# Patient Record
Sex: Female | Born: 1965 | Race: Black or African American | Hispanic: No | Smoking: Never smoker
Health system: Southern US, Community
[De-identification: ages and names within clinical notes are randomized; demographics above are authoritative.]

## PROBLEM LIST (undated history)

## (undated) DIAGNOSIS — I82409 Acute embolism and thrombosis of unspecified deep veins of unspecified lower extremity: Secondary | ICD-10-CM

## (undated) DIAGNOSIS — M797 Fibromyalgia: Secondary | ICD-10-CM

## (undated) DIAGNOSIS — J189 Pneumonia, unspecified organism: Secondary | ICD-10-CM

## (undated) DIAGNOSIS — G5603 Carpal tunnel syndrome, bilateral upper limbs: Secondary | ICD-10-CM

## (undated) DIAGNOSIS — D649 Anemia, unspecified: Secondary | ICD-10-CM

## (undated) DIAGNOSIS — C50919 Malignant neoplasm of unspecified site of unspecified female breast: Secondary | ICD-10-CM

## (undated) DIAGNOSIS — M329 Systemic lupus erythematosus, unspecified: Secondary | ICD-10-CM

## (undated) DIAGNOSIS — C801 Malignant (primary) neoplasm, unspecified: Secondary | ICD-10-CM

## (undated) DIAGNOSIS — R0600 Dyspnea, unspecified: Secondary | ICD-10-CM

## (undated) DIAGNOSIS — M1990 Unspecified osteoarthritis, unspecified site: Secondary | ICD-10-CM

## (undated) DIAGNOSIS — I7300 Raynaud's syndrome without gangrene: Secondary | ICD-10-CM

## (undated) DIAGNOSIS — IMO0002 Reserved for concepts with insufficient information to code with codable children: Secondary | ICD-10-CM

## (undated) HISTORY — PX: BREAST SURGERY: SHX581

## (undated) HISTORY — PX: UPPER GI ENDOSCOPY: SHX6162

## (undated) HISTORY — PX: CHOLECYSTECTOMY: SHX55

---

## 2012-05-25 ENCOUNTER — Encounter (HOSPITAL_BASED_OUTPATIENT_CLINIC_OR_DEPARTMENT_OTHER): Payer: Self-pay | Admitting: *Deleted

## 2012-05-25 ENCOUNTER — Emergency Department (HOSPITAL_BASED_OUTPATIENT_CLINIC_OR_DEPARTMENT_OTHER)
Admission: EM | Admit: 2012-05-25 | Discharge: 2012-05-25 | Disposition: A | Discharge status: Home / Self Care | Payer: Medicare Other | Attending: Emergency Medicine | Admitting: Emergency Medicine

## 2012-05-25 ENCOUNTER — Emergency Department (HOSPITAL_BASED_OUTPATIENT_CLINIC_OR_DEPARTMENT_OTHER): Payer: Medicare Other

## 2012-05-25 DIAGNOSIS — S8391XA Sprain of unspecified site of right knee, initial encounter: Secondary | ICD-10-CM

## 2012-05-25 DIAGNOSIS — X500XXA Overexertion from strenuous movement or load, initial encounter: Secondary | ICD-10-CM | POA: Insufficient documentation

## 2012-05-25 DIAGNOSIS — IMO0002 Reserved for concepts with insufficient information to code with codable children: Secondary | ICD-10-CM | POA: Insufficient documentation

## 2012-05-25 DIAGNOSIS — Y9389 Activity, other specified: Secondary | ICD-10-CM | POA: Insufficient documentation

## 2012-05-25 DIAGNOSIS — Z859 Personal history of malignant neoplasm, unspecified: Secondary | ICD-10-CM | POA: Insufficient documentation

## 2012-05-25 DIAGNOSIS — Y9289 Other specified places as the place of occurrence of the external cause: Secondary | ICD-10-CM | POA: Insufficient documentation

## 2012-05-25 DIAGNOSIS — Z8679 Personal history of other diseases of the circulatory system: Secondary | ICD-10-CM | POA: Insufficient documentation

## 2012-05-25 DIAGNOSIS — Z79899 Other long term (current) drug therapy: Secondary | ICD-10-CM | POA: Insufficient documentation

## 2012-05-25 DIAGNOSIS — Z8739 Personal history of other diseases of the musculoskeletal system and connective tissue: Secondary | ICD-10-CM | POA: Insufficient documentation

## 2012-05-25 HISTORY — DX: Raynaud's syndrome without gangrene: I73.00

## 2012-05-25 HISTORY — DX: Systemic lupus erythematosus, unspecified: M32.9

## 2012-05-25 HISTORY — DX: Reserved for concepts with insufficient information to code with codable children: IMO0002

## 2012-05-25 HISTORY — DX: Malignant (primary) neoplasm, unspecified: C80.1

## 2012-05-25 MED ORDER — OXYCODONE-ACETAMINOPHEN 5-325 MG PO TABS
2.0000 | ORAL_TABLET | Freq: Once | ORAL | Status: AC
  Administered 2012-05-25: 2 via ORAL
  Filled 2012-05-25 (×2): qty 2

## 2012-05-25 MED ORDER — OXYCODONE-ACETAMINOPHEN 5-325 MG PO TABS: 2.0000 | ORAL_TABLET | ORAL | Status: DC | PRN

## 2012-05-25 NOTE — ED Provider Notes (Signed)
Medical screening examination/treatment/procedure(s) were performed by non-physician practitioner and as supervising physician I was immediately available for consultation/collaboration.    Nelia Shi, MD 05/25/12 [phone]

## 2012-05-25 NOTE — ED Notes (Signed)
Pt states she was in an elevator when it stopped suddenly. Felt right knee pop.

## 2012-05-25 NOTE — ED Provider Notes (Signed)
History     CSN: [id number]  Arrival date & time 05/25/12  1902   First MD Initiated Contact with Patient 05/25/12 1939      Chief Complaint  Patient presents with  . Knee Injury    (Consider location/radiation/quality/duration/timing/severity/associated sxs/prior treatment) Patient is a 47 y.o. female presenting with knee pain. The history is provided by the patient. No language interpreter was used.  Knee Pain Location:  Knee Injury: yes   Knee location:  R knee Pain details:    Quality:  Aching   Severity:  Severe   Onset quality:  Sudden   Timing:  Constant   Progression:  Worsening Chronicity:  New Dislocation: no   Associated symptoms: no back pain    Pt reports elevator stopped and her knee popped. Past Medical History  Diagnosis Date  . Lupus   . Cancer   . Raynaud disease     Past Surgical History  Procedure Laterality Date  . Breast surgery    . Cholecystectomy      History reviewed. No pertinent family history.  History  Substance Use Topics  . Smoking status: Never Smoker   . Smokeless tobacco: Not on file  . Alcohol Use: No    OB History   Grav Para Term Preterm Abortions TAB SAB Ect Mult Living                  Review of Systems  Musculoskeletal: Negative for back pain.  All other systems reviewed and are negative.    Allergies  Review of patient's allergies indicates no known allergies.  Home Medications   Current Outpatient Rx  Name  Route  Sig  Dispense  Refill  . hydroxychloroquine (PLAQUENIL) 200 MG tablet   Oral   Take 200 mg by mouth 2 (two) times daily.           BP 136/71  Pulse 62  Temp(Src) 98.1 F (36.7 C) (Oral)  Resp 20  Ht 5\' 4"  (1.626 m)  Wt 200 lb (90.719 kg)  BMI 34.31 kg/m2  SpO2 100%  Physical Exam  Nursing note and vitals reviewed. Constitutional: She is oriented to person, place, and time. She appears well-developed and well-nourished.  HENT:  Head: Normocephalic and atraumatic.   Musculoskeletal: She exhibits tenderness.  Tender right knee diffusely anterior and posterior,  From,  nv and ns intact,  I am unable to assess stability  Neurological: She is alert and oriented to person, place, and time. She has normal reflexes.  Skin: Skin is warm and dry.  Psychiatric: She has a normal mood and affect.    ED Course  Procedures (including critical care time)  Labs Reviewed - No data to display Dg Knee Complete 4 Views Right  05/25/2012  *RADIOLOGY REPORT*  Clinical Data: Knee injury.  Pain  RIGHT KNEE - COMPLETE 4+ VIEW  Comparison:  None.  Findings:  There is no evidence of fracture, dislocation, or joint effusion.  There is no evidence of arthropathy or other focal bone abnormality.  Soft tissues are unremarkable.  IMPRESSION: Negative.   Original Report Authenticated By: Janeece Riggers, M.D.      No diagnosis found.    MDM  Xray no acute abnormality, Pt placed in a knee imbolizer,   Pt given rx for percocet.   I advised follow up with Dr. Pearletha Forge or your Physician for recheck        Elson Areas, [place] 05/25/12 2030  Lonia Skinner [place], [place] 05/25/12  2032 

## 2012-05-27 ENCOUNTER — Ambulatory Visit (INDEPENDENT_AMBULATORY_CARE_PROVIDER_SITE_OTHER): Payer: Medicare Other | Admitting: Family Medicine

## 2012-05-27 ENCOUNTER — Encounter: Payer: Self-pay | Admitting: Family Medicine

## 2012-05-27 VITALS — BP 129/74 | HR 68 | Ht 64.0 in | Wt 200.0 lb

## 2012-05-27 DIAGNOSIS — S8990XA Unspecified injury of unspecified lower leg, initial encounter: Secondary | ICD-10-CM

## 2012-05-27 DIAGNOSIS — S99929A Unspecified injury of unspecified foot, initial encounter: Secondary | ICD-10-CM

## 2012-05-27 DIAGNOSIS — S8991XA Unspecified injury of right lower leg, initial encounter: Secondary | ICD-10-CM

## 2012-05-27 MED ORDER — OXYCODONE-ACETAMINOPHEN 5-325 MG PO TABS: 1.0000 | ORAL_TABLET | Freq: Four times a day (QID) | ORAL | Status: DC | PRN

## 2012-05-27 NOTE — Patient Instructions (Addendum)
The x-rays in the emergency department were normal (no fractures). The greatest concern based on your limited motion, pain, swelling is that you injured the ACL or PCL and/or the meniscus of your knee. It's difficult to definitively say which of these though because the examination requires more of your knee to bend than it can right now. Ideally we move forward with an MRI to further assess the damage done to the inside of your knee and go from there. Start motion exercises as I showed you - do them twice a day. Can wear immobilizer or knee brace as needed but do not rely on this. Ibuprofen 3 tabs three times a day with food for pain and inflammation. Percocet as needed for severe pain but no driving on this medication. Follow up with me in 2 weeks at the latest.

## 2012-05-28 ENCOUNTER — Encounter: Payer: Self-pay | Admitting: Family Medicine

## 2012-05-28 DIAGNOSIS — M25561 Pain in right knee: Secondary | ICD-10-CM | POA: Insufficient documentation

## 2012-05-28 NOTE — Progress Notes (Signed)
Subjective:    Patient ID: Michelle Cain, female    DOB: 04-28-1965, 47 y.o.   MRN: [id number]  PCP: None  HPI 47 yo F here for right knee injury.  Patient reports on 2/8 she was in an elevator at the [facility] by [employer]. The elevator was moving slowly and appeared to stick then jerked downward quickly between 4th and 3rd floors causing her to have a sharp pain in right knee from knee twisting. Started limping. Noticed swelling that has improved since then.  No bruising. Taking tylenol and elevating as much as possible. In ED had x-rays negative for a fracture, placed in an immobilizer. Very difficult for her to bend knee at all due to pain. No prior right knee problems.  Past Medical History  Diagnosis Date  . Lupus   . Cancer   . Raynaud disease     Current Outpatient Prescriptions on File Prior to Visit  Medication Sig Dispense Refill  . hydroxychloroquine (PLAQUENIL) 200 MG tablet Take 200 mg by mouth 2 (two) times daily.       No current facility-administered medications on file prior to visit.    Past Surgical History  Procedure Laterality Date  . Breast surgery    . Cholecystectomy      No Known Allergies  History   Social History  . Marital Status: Single    Spouse Name: N/A    Number of Children: N/A  . Years of Education: N/A   Occupational History  . Not on file.   Social History Main Topics  . Smoking status: Never Smoker   . Smokeless tobacco: Not on file  . Alcohol Use: No  . Drug Use: No  . Sexually Active: Yes    Birth Control/ Protection: Post-menopausal   Other Topics Concern  . Not on file   Social History Narrative  . No narrative on file    Family History  Problem Relation Age of Onset  . Sudden death Neg Hx   . Hypertension Neg Hx   . Hyperlipidemia Neg Hx   . Heart attack Neg Hx   . Diabetes Neg Hx     BP 129/74  Pulse 68  Ht 5\' 4"  (1.626 m)  Wt 200 lb (90.719 kg)  BMI 34.31 kg/m2  Review of Systems See  HPI above.    Objective:   Physical Exam Gen: NAD, sitting in wheelchair with knee extended.  R knee: No obvious effusion though difficult to discern due to habitus (ultrasound does not show a large effusion).  No other deformity, no bruising. Diffuse anterior TTP including joint lines and post patellar facets. Very limited ROM with guarding - has full extension but can only bend about 10 degrees and has to stop due to pain. Unable to adequately perform drawers, lachmanns, mcmurrays, apleys. Negative apprehension though painful. NV intact distally.    Assessment & Plan:  1. Right knee injury - Exam very difficult to perform due to significant guarding and pain.  Advised she start doing home ROM exercises, ice, use ace wrap for compression, discontinue immobilizer.  Ibuprofen with percocet as needed for severe pain.  Two options at this point - reevaluate in 1-2 weeks to repeat exam, track how she's doing clinically vs going forward with MRI to assess for ACL tear, meniscal tears as a result of twisting injury.  She will start with conservative care for now (is trying to get things worked out with the hotel re: coverage) and call to go  forward with MRI.

## 2012-05-28 NOTE — Assessment & Plan Note (Signed)
Exam very difficult to perform due to significant guarding and pain.  Advised she start doing home ROM exercises, ice, use ace wrap for compression, discontinue immobilizer.  Ibuprofen with percocet as needed for severe pain.  Two options at this point - reevaluate in 1-2 weeks to repeat exam, track how she's doing clinically vs going forward with MRI to assess for ACL tear, meniscal tears as a result of twisting injury.  She will start with conservative care for now (is trying to get things worked out with the hotel re: coverage) and call to go forward with MRI.

## 2012-06-10 ENCOUNTER — Encounter: Payer: Self-pay | Admitting: Family Medicine

## 2012-06-10 ENCOUNTER — Ambulatory Visit: Payer: Medicare Other | Admitting: Family Medicine

## 2012-06-10 ENCOUNTER — Ambulatory Visit (INDEPENDENT_AMBULATORY_CARE_PROVIDER_SITE_OTHER): Payer: Medicare Other | Admitting: Family Medicine

## 2012-06-10 VITALS — BP 114/81 | HR 99 | Ht 64.0 in | Wt 200.0 lb

## 2012-06-10 DIAGNOSIS — S8991XD Unspecified injury of right lower leg, subsequent encounter: Secondary | ICD-10-CM

## 2012-06-10 DIAGNOSIS — M25561 Pain in right knee: Secondary | ICD-10-CM

## 2012-06-10 DIAGNOSIS — M25569 Pain in unspecified knee: Secondary | ICD-10-CM

## 2012-06-10 DIAGNOSIS — Z5189 Encounter for other specified aftercare: Secondary | ICD-10-CM

## 2012-06-10 MED ORDER — OXYCODONE-ACETAMINOPHEN 5-325 MG PO TABS: 1.0000 | ORAL_TABLET | Freq: Four times a day (QID) | ORAL | Status: DC | PRN

## 2012-06-11 ENCOUNTER — Encounter: Payer: Self-pay | Admitting: Family Medicine

## 2012-06-11 NOTE — Assessment & Plan Note (Signed)
Concerning for large meniscal tear.  Still a lot of difficulty bearing weight, joint line pain, inability to fully bend knee.  Will move forward with MRI to assess for meniscal tear.  In meantime percocet, ibuprofen, home exercise program, icing, relative rest.

## 2012-06-11 NOTE — Progress Notes (Addendum)
Subjective:    Patient ID: Michelle Cain, female    DOB: 04-01-1966, 47 y.o.   MRN: [id number]  PCP: None  HPI  47 yo F here for f/u right knee injury.  2/10: Patient reports on 2/8 she was in an elevator at the [facility] by [employer]. The elevator was moving slowly and appeared to stick then jerked downward quickly between 4th and 3rd floors causing her to have a sharp pain in right knee from knee twisting. Started limping. Noticed swelling that has improved since then.  No bruising. Taking tylenol and elevating as much as possible. In ED had x-rays negative for a fracture, placed in an immobilizer. Very difficult for her to bend knee at all due to pain. No prior right knee problems.  2/24: Patient reports right knee still very painful. Able to bend some but can't bend to the point she can sit comfortably. Some swelling. Doing home exercise program, using immobilizer as needed, ibuprofen and pain medication. Difficulty putting weight on this leg due to pain.  Past Medical History  Diagnosis Date  . Lupus   . Cancer   . Raynaud disease     Current Outpatient Prescriptions on File Prior to Visit  Medication Sig Dispense Refill  . amLODipine (NORVASC) 5 MG tablet Take 5 mg by mouth daily.      Marland Kitchen aspirin EC 81 MG tablet Take 81 mg by mouth daily.      Marland Kitchen azaTHIOprine (IMURAN) 50 MG tablet Take 50 mg by mouth 2 (two) times daily.      . hydroxychloroquine (PLAQUENIL) 200 MG tablet Take 200 mg by mouth 2 (two) times daily.      Marland Kitchen omeprazole (PRILOSEC) 20 MG capsule Take 20 mg by mouth daily.       No current facility-administered medications on file prior to visit.    Past Surgical History  Procedure Laterality Date  . Breast surgery    . Cholecystectomy      No Known Allergies  History   Social History  . Marital Status: Single    Spouse Name: N/A    Number of Children: N/A  . Years of Education: N/A   Occupational History  . Not on file.   Social  History Main Topics  . Smoking status: Never Smoker   . Smokeless tobacco: Not on file  . Alcohol Use: No  . Drug Use: No  . Sexually Active: Yes    Birth Control/ Protection: Post-menopausal   Other Topics Concern  . Not on file   Social History Narrative  . No narrative on file    Family History  Problem Relation Age of Onset  . Sudden death Neg Hx   . Hypertension Neg Hx   . Hyperlipidemia Neg Hx   . Heart attack Neg Hx   . Diabetes Neg Hx     BP 114/81  Pulse 99  Ht 5\' 4"  (1.626 m)  Wt 200 lb (90.719 kg)  BMI 34.31 kg/m2  Review of Systems  See HPI above.    Objective:   Physical Exam  Gen: NAD.  R knee: No obvious effusion though difficult to discern due to habitus.  No other deformity, no bruising. Medial > lateral joint line tenderness.  No post patellar facet, other knee TTP. ROM now 0 - 80 degrees, mildly improved. Negative valgus and varus stress.  Negative ant and post drawers.  Positive mcmurrays and apleys - pain felt deep with both of these. Negative apprehension. NV  intact distally.    Assessment & Plan:  1. Right knee injury - Concerning for large meniscal tear.  Still a lot of difficulty bearing weight, joint line pain, inability to fully bend knee.  Will move forward with MRI to assess for meniscal tear.  In meantime percocet, ibuprofen, home exercise program, icing, relative rest.  Addendum 3/3:  MRI results reviewed and discussed with patient.  No evidence meniscal tear or ligament tear.  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  Discussed PT, cortisone injection - she would like to try cortisone injection - will call us to set up appointment for this.

## 2012-06-15 ENCOUNTER — Ambulatory Visit (HOSPITAL_BASED_OUTPATIENT_CLINIC_OR_DEPARTMENT_OTHER)
Admission: RE | Admit: 2012-06-15 | Discharge: 2012-06-15 | Disposition: A | Discharge status: Home / Self Care | Payer: Medicare Other | Source: Ambulatory Visit | Attending: Family Medicine | Admitting: Family Medicine

## 2012-06-15 DIAGNOSIS — M25669 Stiffness of unspecified knee, not elsewhere classified: Secondary | ICD-10-CM | POA: Insufficient documentation

## 2012-06-15 DIAGNOSIS — M25561 Pain in right knee: Secondary | ICD-10-CM

## 2012-06-15 DIAGNOSIS — S8990XA Unspecified injury of unspecified lower leg, initial encounter: Secondary | ICD-10-CM | POA: Insufficient documentation

## 2012-06-15 DIAGNOSIS — M2240 Chondromalacia patellae, unspecified knee: Secondary | ICD-10-CM | POA: Insufficient documentation

## 2012-06-15 DIAGNOSIS — X58XXXA Exposure to other specified factors, initial encounter: Secondary | ICD-10-CM | POA: Insufficient documentation

## 2012-06-15 DIAGNOSIS — M25569 Pain in unspecified knee: Secondary | ICD-10-CM | POA: Insufficient documentation

## 2012-06-20 ENCOUNTER — Ambulatory Visit (INDEPENDENT_AMBULATORY_CARE_PROVIDER_SITE_OTHER): Payer: Medicare Other | Admitting: Family Medicine

## 2012-06-20 ENCOUNTER — Encounter: Payer: Self-pay | Admitting: Family Medicine

## 2012-06-20 VITALS — BP 136/81 | Ht 64.0 in | Wt 200.0 lb

## 2012-06-20 DIAGNOSIS — M25569 Pain in unspecified knee: Secondary | ICD-10-CM

## 2012-06-20 DIAGNOSIS — M25561 Pain in right knee: Secondary | ICD-10-CM

## 2012-06-24 ENCOUNTER — Encounter: Payer: Self-pay | Admitting: Family Medicine

## 2012-06-24 NOTE — Assessment & Plan Note (Signed)
Flare of underlying chronic OCD.  Advised we trial cortisone injection vs PT - she would like to go ahead with injection which was done today.  Consider PT if not improving as expected.  F/u in 1 month to 6 weeks.    After informed written consent, patient was seated on exam table. Right knee was prepped with alcohol swab and utilizing anteromedial approach, patient's right knee was injected intraarticularly with 3:1 marcaine: depomedrol. Patient tolerated the procedure well without immediate complications.

## 2012-06-24 NOTE — Progress Notes (Signed)
Subjective:    Patient ID: Michelle Cain, female    DOB: 02/27/1966, 47 y.o.   MRN: [id number]  PCP: None  Knee Pain    47 yo F here for f/u right knee injury.  2/10: Patient reports on 2/8 she was in an elevator at the [facility] by [employer]. The elevator was moving slowly and appeared to stick then jerked downward quickly between 4th and 3rd floors causing her to have a sharp pain in right knee from knee twisting. Started limping. Noticed swelling that has improved since then.  No bruising. Taking tylenol and elevating as much as possible. In ED had x-rays negative for a fracture, placed in an immobilizer. Very difficult for her to bend knee at all due to pain. No prior right knee problems.  2/24: Patient reports right knee still very painful. Able to bend some but can't bend to the point she can sit comfortably. Some swelling. Doing home exercise program, using immobilizer as needed, ibuprofen and pain medication. Difficulty putting weight on this leg due to pain.  3/6: Patient comes in today for intraarticular cortisone injection.   MRI showed no evidence of meniscal, ligamentous injury though she does have a small < 1 cm osteochondral defect that was likely flared in the elevator accident.  Past Medical History  Diagnosis Date  . Lupus   . Cancer   . Raynaud disease     Current Outpatient Prescriptions on File Prior to Visit  Medication Sig Dispense Refill  . amLODipine (NORVASC) 5 MG tablet Take 5 mg by mouth daily.      Marland Kitchen aspirin EC 81 MG tablet Take 81 mg by mouth daily.      Marland Kitchen azaTHIOprine (IMURAN) 50 MG tablet Take 50 mg by mouth 2 (two) times daily.      . hydroxychloroquine (PLAQUENIL) 200 MG tablet Take 200 mg by mouth 2 (two) times daily.      Marland Kitchen omeprazole (PRILOSEC) 20 MG capsule Take 20 mg by mouth daily.      Marland Kitchen oxyCODONE-acetaminophen (PERCOCET/ROXICET) 5-325 MG per tablet Take 1 tablet by mouth every 6 (six) hours as needed for pain.  60 tablet  0    No current facility-administered medications on file prior to visit.    Past Surgical History  Procedure Laterality Date  . Breast surgery    . Cholecystectomy      No Known Allergies  History   Social History  . Marital Status: Single    Spouse Name: N/A    Number of Children: N/A  . Years of Education: N/A   Occupational History  . Not on file.   Social History Main Topics  . Smoking status: Never Smoker   . Smokeless tobacco: Not on file  . Alcohol Use: No  . Drug Use: No  . Sexually Active: Yes    Birth Control/ Protection: Post-menopausal   Other Topics Concern  . Not on file   Social History Narrative  . No narrative on file    Family History  Problem Relation Age of Onset  . Sudden death Neg Hx   . Hypertension Neg Hx   . Hyperlipidemia Neg Hx   . Heart attack Neg Hx   . Diabetes Neg Hx     BP 136/81  Ht 5\' 4"  (1.626 m)  Wt 200 lb (90.719 kg)  BMI 34.31 kg/m2  Review of Systems  See HPI above.    Objective:   Physical Exam  Gen: NAD.  R knee (  exam not repeated today): No obvious effusion though difficult to discern due to habitus.  No other deformity, no bruising. Medial > lateral joint line tenderness.  No post patellar facet, other knee TTP. ROM now 0 - 80 degrees, mildly improved. Negative valgus and varus stress.  Negative ant and post drawers.  Positive mcmurrays and apleys - pain felt deep with both of these. Negative apprehension. NV intact distally.    Assessment & Plan:  1. Right knee injury - Flare of underlying chronic OCD.  Advised we trial cortisone injection vs PT - she would like to go ahead with injection which was done today.  Consider PT if not improving as expected.  F/u in 1 month to 6 weeks.    After informed written consent, patient was seated on exam table. Right knee was prepped with alcohol swab and utilizing anteromedial approach, patient's right knee was injected intraarticularly with 3:1 marcaine:  depomedrol. Patient tolerated the procedure well without immediate complications.

## 2012-07-19 ENCOUNTER — Ambulatory Visit: Payer: Medicare Other | Admitting: Family Medicine

## 2012-07-30 ENCOUNTER — Encounter: Payer: Self-pay | Admitting: Family Medicine

## 2012-07-30 ENCOUNTER — Ambulatory Visit (INDEPENDENT_AMBULATORY_CARE_PROVIDER_SITE_OTHER): Payer: Medicare Other | Admitting: Family Medicine

## 2012-07-30 VITALS — BP 137/84 | HR 70 | Ht 64.0 in | Wt 210.0 lb

## 2012-07-30 DIAGNOSIS — M722 Plantar fascial fibromatosis: Secondary | ICD-10-CM

## 2012-07-30 DIAGNOSIS — M25561 Pain in right knee: Secondary | ICD-10-CM

## 2012-07-30 DIAGNOSIS — M25569 Pain in unspecified knee: Secondary | ICD-10-CM

## 2012-07-30 MED ORDER — TRAMADOL HCL 50 MG PO TABS: 50.0000 mg | ORAL_TABLET | Freq: Three times a day (TID) | ORAL | Status: DC | PRN

## 2012-07-30 NOTE — Patient Instructions (Addendum)
You have plantar fasciitis Take tramadol, tylenol, and/or aleve as needed for pain - tramadol was sent to the pharmacy downstairs. Plantar fascia stretch for 20-30 seconds (do 3 of these) in morning Lowering/raise on a step exercises 3 x 10 once or twice a day - this is very important for long term recovery. Can add heel walks, toe walks forward and backward as well Ice heel for 15 minutes as needed. Avoid flat shoes/barefoot walking as much as possible. Arch straps have been shown to help with pain. Orthotics with heel lift may be helpful (Dr. Jari Sportsman active series) Steroid injection is a consideration for short term pain relief if you are struggling. Start physical therapy for right plantar fasciitis and your right knee. Follow up with me in 6 weeks.

## 2012-07-31 ENCOUNTER — Encounter: Payer: Self-pay | Admitting: Family Medicine

## 2012-07-31 DIAGNOSIS — M722 Plantar fascial fibromatosis: Secondary | ICD-10-CM | POA: Insufficient documentation

## 2012-07-31 NOTE — Progress Notes (Signed)
Subjective:    Patient ID: Michelle Cain, female    DOB: 10-02-1965, 47 y.o.   MRN: [id number]  PCP: None  Knee Pain    47 yo F here for f/u right knee injury.  2/10: Patient reports on 2/8 she was in an elevator at the [facility] [employer]. The elevator was moving slowly and appeared to stick then jerked downward quickly between 4th and 3rd floors causing her to have a sharp pain in right knee from knee twisting. Started limping. Noticed swelling that has improved since then.  No bruising. Taking tylenol and elevating as much as possible. In ED had x-rays negative for a fracture, placed in an immobilizer. Very difficult for her to bend knee at all due to pain. No prior right knee problems.  2/24: Patient reports right knee still very painful. Able to bend some but can't bend to the point she can sit comfortably. Some swelling. Doing home exercise program, using immobilizer as needed, ibuprofen and pain medication. Difficulty putting weight on this leg due to pain.  3/6: Patient comes in today for intraarticular cortisone injection.   MRI showed no evidence of meniscal, ligamentous injury though she does have a small < 1 cm osteochondral defect that was likely flared in the elevator accident.  4/15: Patient reports she is 80% improved from right knee injury following injection. She has also started to have problems with bottom of foot on this side. Takes oxycodone as needed for pain.  Past Medical History  Diagnosis Date  . Lupus   . Cancer   . Raynaud disease     Current Outpatient Prescriptions on File Prior to Visit  Medication Sig Dispense Refill  . amLODipine (NORVASC) 5 MG tablet Take 5 mg by mouth daily.      Marland Kitchen aspirin EC 81 MG tablet Take 81 mg by mouth daily.      Marland Kitchen azaTHIOprine (IMURAN) 50 MG tablet Take 50 mg by mouth 2 (two) times daily.      . hydroxychloroquine (PLAQUENIL) 200 MG tablet Take 200 mg by mouth 2 (two) times daily.      Marland Kitchen omeprazole  (PRILOSEC) 20 MG capsule Take 20 mg by mouth daily.       No current facility-administered medications on file prior to visit.    Past Surgical History  Procedure Laterality Date  . Breast surgery    . Cholecystectomy      No Known Allergies  History   Social History  . Marital Status: Single    Spouse Name: N/A    Number of Children: N/A  . Years of Education: N/A   Occupational History  . Not on file.   Social History Main Topics  . Smoking status: Never Smoker   . Smokeless tobacco: Not on file  . Alcohol Use: No  . Drug Use: No  . Sexually Active: Yes    Birth Control/ Protection: Post-menopausal   Other Topics Concern  . Not on file   Social History Narrative  . No narrative on file    Family History  Problem Relation Age of Onset  . Sudden death Neg Hx   . Hypertension Neg Hx   . Hyperlipidemia Neg Hx   . Heart attack Neg Hx   . Diabetes Neg Hx     BP 137/84  Pulse 70  Ht 5\' 4"  (1.626 m)  Wt 210 lb (95.255 kg)  BMI 36.03 kg/m2  Review of Systems  See HPI above.  Objective:   Physical Exam  Gen: NAD.  R knee: No obvious effusion though difficult to discern due to habitus.  No other deformity, no bruising. Medial > lateral joint line tenderness.  No post patellar facet, other knee TTP. ROM 0 - 110 degrees, improved. Negative valgus and varus stress.  Negative ant and post drawers.  Negative mcmurrays and apleys - pain felt deep with both of these. Negative apprehension. NV intact distally.  R foot: Tenderness at plantar fascia insertion on calcaneus.    Assessment & Plan:  1. Right knee injury - Flare of underlying chronic OCD.  Patient is 80% improved with injection, oral medications.  Will start PT for this and plantar fasciitis.  F/u in about 6 weeks for reevaluation.  2. Right plantar fasciitis - arch binder, start formal PT.  Consider inserts/orthotics, injection if not improving.

## 2012-07-31 NOTE — Assessment & Plan Note (Signed)
arch binder, start formal PT.  Consider inserts/orthotics, injection if not improving.

## 2012-07-31 NOTE — Assessment & Plan Note (Signed)
Flare of underlying chronic OCD.  Patient is 80% improved with injection, oral medications.  Will start PT for this and plantar fasciitis.  F/u in about 6 weeks for reevaluation.

## 2012-08-08 ENCOUNTER — Other Ambulatory Visit: Payer: Self-pay | Admitting: *Deleted

## 2012-08-08 MED ORDER — MELOXICAM 15 MG PO TABS: 15.0000 mg | ORAL_TABLET | Freq: Every day | ORAL | Status: DC

## 2012-09-10 ENCOUNTER — Ambulatory Visit: Payer: Medicare Other | Admitting: Family Medicine

## 2019-08-25 ENCOUNTER — Telehealth: Payer: Self-pay | Admitting: Oncology

## 2019-08-25 NOTE — Progress Notes (Signed)
Spoke with patient this morning regarding referral we received from [facility] in [place].  The patient states she just wants to use [place] as her treatment center but plans to keep her oncologist there.  I explained my role as GI nurse navigator. She is aware of her appointment tomorrow 5/11 at 2:00 with Dr. Benay Spice.

## 2019-08-25 NOTE — Telephone Encounter (Signed)
Received a new pt referral from the [facility]. [name] has been cld and scheduled to see Dr. Benay Spice on 5/11 at 2pm. She's aware to arrive 15 minutes early. I provided the address and location to our facility.

## 2019-08-26 ENCOUNTER — Other Ambulatory Visit: Payer: Self-pay

## 2019-08-26 ENCOUNTER — Inpatient Hospital Stay: Payer: Medicare Other | Attending: Oncology | Admitting: Oncology

## 2019-08-26 VITALS — BP 113/68 | HR 79 | Temp 98.5°F | Resp 18 | Ht 64.0 in | Wt 216.2 lb

## 2019-08-26 DIAGNOSIS — I10 Essential (primary) hypertension: Secondary | ICD-10-CM | POA: Insufficient documentation

## 2019-08-26 DIAGNOSIS — Z7189 Other specified counseling: Secondary | ICD-10-CM | POA: Diagnosis not present

## 2019-08-26 DIAGNOSIS — C169 Malignant neoplasm of stomach, unspecified: Secondary | ICD-10-CM | POA: Diagnosis not present

## 2019-08-26 DIAGNOSIS — Z86718 Personal history of other venous thrombosis and embolism: Secondary | ICD-10-CM | POA: Insufficient documentation

## 2019-08-26 DIAGNOSIS — R1310 Dysphagia, unspecified: Secondary | ICD-10-CM | POA: Insufficient documentation

## 2019-08-26 DIAGNOSIS — G629 Polyneuropathy, unspecified: Secondary | ICD-10-CM | POA: Insufficient documentation

## 2019-08-26 DIAGNOSIS — Z5111 Encounter for antineoplastic chemotherapy: Secondary | ICD-10-CM | POA: Insufficient documentation

## 2019-08-26 DIAGNOSIS — M329 Systemic lupus erythematosus, unspecified: Secondary | ICD-10-CM | POA: Insufficient documentation

## 2019-08-26 DIAGNOSIS — Z853 Personal history of malignant neoplasm of breast: Secondary | ICD-10-CM | POA: Insufficient documentation

## 2019-08-26 DIAGNOSIS — D709 Neutropenia, unspecified: Secondary | ICD-10-CM | POA: Insufficient documentation

## 2019-08-26 DIAGNOSIS — Z7901 Long term (current) use of anticoagulants: Secondary | ICD-10-CM | POA: Insufficient documentation

## 2019-08-26 DIAGNOSIS — R0602 Shortness of breath: Secondary | ICD-10-CM | POA: Insufficient documentation

## 2019-08-26 DIAGNOSIS — I7300 Raynaud's syndrome without gangrene: Secondary | ICD-10-CM | POA: Insufficient documentation

## 2019-08-26 DIAGNOSIS — Z5112 Encounter for antineoplastic immunotherapy: Secondary | ICD-10-CM | POA: Insufficient documentation

## 2019-08-26 NOTE — Progress Notes (Signed)
[facility] Patient Consult   Requesting MD: Julius Boniface 54 y.o.  June 27, 1965    Reason for Consult: Gastric cancer   HPI: [name] developed progressive dysphagia in the spring 2019.  She underwent an upper endoscopy in [place].  This confirmed esophageal erosions and gastric erosions.  A biopsy of the distal esophagus, gastric erosion, and random stomach biopsies returned positive for adenocarcinoma, diffuse pattern involving the distal esophagus and gastric antrum.  The HER-2 returned negative.  A colonoscopy was aborted due to a narrow rectum. CTs revealed segments of wall thickening and luminal narrowing of the colon at the hepatic flexure and a thickened segment at the cecum.  A PET scan showed no evidence of distant metastases or abnormal uptake other than the thickening in the distal esophagus and proximal stomach.  She was referred to the [facility] in [place] in June 2019.  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  No loss of mismatch repair protein expression.  Immunohistochemical stains on the gastric biopsies found the tumor cells negative for GATA 3 and ER.  A repeat upper endoscopy and colonoscopy 10/05/2017 revealed multiple foci of thickening/masses in the colon including at the hepatic flexure, cecum, and distal rectum.  Biopsies from the stomach revealed adenocarcinoma.  Biopsies from the cecum and hepatic flexure revealed metastatic adenocarcinoma consistent with gastric origin.  A PD-L1 combined positive score on the gastric biopsy returned at 2.  No loss of mismatch repair protein expression on the hepatic flexure biopsy.  MSI testing could not be performed.  She began treatment with FOLFOX in 08/2017.  Restaging CTs in October 2019 revealed stable  disease and FOLFOX was continued.  CTs in December 2019 revealed stable disease.  She was transitioned to Xeloda maintenance in January 2020.  Her care was transitioned to the cancer treatment center of [place] in [place] where she continued Xeloda maintenance.  Restaging CTs in June and September 2020 revealed stable disease.  CTs on 06/23/2019 revealed no evidence of metastatic disease in the lungs.  Nonspecific thickening at the GE junction.  There is interval development of a bladder mass measuring 5 cm.  A cystoscopy revealed metastatic signet ring cell adenocarcinoma.  Xeloda was discontinued and treatment was started with ramucirumab/paclitaxel with day 1 cycle 1 given on 08/19/2019.  [name] lives in [place].  She has decided to transition her treatment to [facility].  She is here today with her mother.  Past Medical History:  Diagnosis Date  .  Left breast cancer, multifocal, largest 1.8 cm, grade 3, negative resection margins,pT1c,pN0, ER positive, PR positive, and HER-2 positive  2008  . Lupus, mixed connective tissue disease   . Raynaud disease     .  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  Lower extremity DVT   .  G1, P1  Past Surgical History:  Procedure Laterality Date  . BREAST SURGERY    . CHOLECYSTECTOMY      Medications: Reviewed  Allergies: No Known Allergies  Family history: A maternal aunt had ovarian cancer.  2 maternal aunts had breast cancer.  Maternal cousin had thyroid cancer.  Paternal great aunts had breast cancer  Social History:   She lives with her mother and grandson in [place].  She does not use cigarettes or alcohol.  No transfusion history.  No risk  factor for HIV or hepatitis.  She does not work outside of the home  ROS:   Positives include: Anorexia, 60 pound weight loss, vomits approximately 3 times daily with solids, diffuse arthralgias, "lump "near the right ear beginning yesterday, hoarseness, numbness in  the fingers and toes, skin thickening and dryness of the hands and feet  A complete ROS was otherwise negative.  Physical Exam:  Blood pressure 113/68, pulse 79, temperature 98.5 F (36.9 C), temperature source Temporal, resp. rate 18, height _0  (1.626 m), weight 216 lb 3.2 oz (98.1 kg), SpO2 99 %.  HEENT: 1-2 cm area of firm masslike fullness posterior to the angle of the right jaw Lungs: Clear bilaterally Cardiac: Regular rate and rhythm Abdomen: No hepatosplenomegaly, no mass, nontender  Vascular: No leg edema Lymph nodes: No cervical, supraclavicular, axillary, or inguinal nodes Neurologic: Alert and oriented, the motor exam appears grossly intact in the upper and lower extremities bilaterally, mild loss of vibratory sense of the fingertips bilaterally Skin: No rash, skin thickening and dry desquamation at the palms and soles Musculoskeletal: No spine tenderness     Imaging:  As per HPI   Assessment/Plan:   1. Gastric cancer  Presenting with dysphagia spring 2019  Upper endoscopy 08/17/2017 revealed gastric and esophageal erosion, biopsies from the distal esophagus, gastric erosion, and random stomach biopsies confirmed poorly differentiated invasive adenocarcinoma with signet ring cell morphology, no loss of mismatch repair protein expression, HER-2 negative by FISH, GATA3 negative, ER negative  CTs revealed wall thickening and luminal narrowing of the colon at the hepatic flexure and cecum  PET scan with no evidence of distant metastatic disease or abnormal uptake other than thickening of the distal esophagus and proximal stomach  Colonoscopy 10/05/2017 at [facility] revealed multiple foci of thickening/masses at the cecum, hepatic flexure, and distal rectum, biopsy from the cecum and hepatic flexure revealed metastatic adenocarcinoma of gastric origin, 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, PD-L1 combined  positive score 2 on hepatic flexure biopsy  CTs 10/23/2017-diffuse prominence of the gastric wall, especially the antrum, focal wall thickening of the distal ascending colon and hepatic flexure, thickening of the cecum, nonspecific haziness of the mesenteric fat in the pelvis, mild prominence of lymph nodes at the greater curvature of the stomach  Treatment with FOLFOX beginning July 2019  CTs October 2019-stable disease, FOLFOX continued  CTs December 2019-stable disease  January 2020 treatment transition to Xeloda maintenance, care transition to [facility]  CTs in June 2020 in September 2020-stable disease, Xeloda continued  CTs 06/23/2019-nonspecific thickening of the GE junction, intraluminal bladder mass  Cystoscopy-metastatic signet ring cell adenocarcinoma  Cycle 1 day 1 ramucirumab/paclitaxel 08/19/2019  2.  Left breast cancer 2008,pT1c,pN0, status post a left lumpectomy with adjuvant chemotherapy and radiation, ER positive, PR positive, HER-2 positive  3.  Mixed connective tissue disease/SLE  4.  Lower extremity deep vein thrombosis maintained on apixaban  5.  Family history of multiple cancers including breast and ovarian cancer  6.  Dysphagia and intermittent vomiting secondary to #1  7.  Hypertension  8.  Peripheral neuropathy  9.  Masslike fullness at the posterior right parotid/angle of the jaw   Disposition:   [name] was diagnosed with gastric cancer in 2019.  She was noted to have a diffuse gastric adenocarcinoma with metastatic disease involving the colon.  She has been treated with systemic therapy for the past 2 years, initially FOLFOX and  then transition to maintenance capecitabine in January 2020.  There was recent disease progression in the bladder and treatment has been changed to paclitaxel/ramucirumab.  I discussed the prognosis and treatment options with [name] and her mother.  We reviewed the treatment  history and current treatment plan.  She understands no therapy will be curative.  The goal of systemic therapy is to delay disease progression, improve symptoms, and potentially prolong survival.  I agree with the plan to continue treatment with paclitaxel and ramucirumab.  We reviewed potential toxicities associated with this regimen including the chance for alopecia and hematologic toxicity.  We discussed the neuropathy and bone pain associated with paclitaxel.  We discussed the hypertension, bleeding, thromboembolic disease, bowel perforation, and CNS toxicity associated with ramucirumab.  She would like to continue paclitaxel/ramucirumab therapy.  The plan is to begin a new cycle on 09/02/2019.  She has a significant family history of multiple cancers including breast and ovarian cancer.  She agrees to a referral to the genetics counselor for molecular testing.  We will request additional records from the [facility] including results of recent Foundation 1 testing, chemotherapy records with regard to the number of FOLFOX cycles, CT and PET images, Doppler report, the pathology report from the bladder biopsy, and records regarding treatment of breast cancer.  She will return for an office visit to begin ramucirumab/paclitaxel therapy on 09/02/2019.  Betsy Coder, MD  08/26/2019, 2:16 PM

## 2019-08-26 NOTE — Progress Notes (Signed)
START ON PATHWAY REGIMEN - Gastroesophageal     A cycle is every 28 days:     Ramucirumab      Paclitaxel   **Always confirm dose/schedule in your pharmacy ordering system**  Patient Characteristics: Distant Metastases (cM1/pM1) / Locally Recurrent Disease, Adenocarcinoma - Esophageal, GE Junction, and Gastric, Second Line, MSS/pMMR or MSI Unknown Histology: Adenocarcinoma Disease Classification: Gastric Therapeutic Status: Distant Metastases (No Additional Staging) Line of Therapy: Second Line Microsatellite/Mismatch Repair Status: Unknown Intent of Therapy: Non-Curative / Palliative Intent, Discussed with Patient

## 2019-08-26 NOTE — Progress Notes (Signed)
Met with patient and her mother at her initial medical oncology appointment with Dr. Benay Spice today.  I had spoken to the patient on the phone previously.  She was given my card with my direct contact number and encouraged to call with any questions or concerns.  She previously received treatment in [place] at [facility].  We have received some of her records.  They both verbalized an understanding of the plan outlined by Dr. Benay Spice today and she will return on 5/18 for treatment.

## 2019-08-27 NOTE — Progress Notes (Signed)
All patient's records from [facility] have been sent to HIM to be scanned into the chart.

## 2019-08-29 NOTE — Progress Notes (Signed)
Pharmacist Chemotherapy Monitoring - Initial Assessment    Anticipated start date: 09/04/19 (C1 given at another facility)  Regimen:  . Are orders appropriate based on the patient's diagnosis, regimen, and cycle? Yes . Does the plan date match the patient's scheduled date? Yes . Is the sequencing of drugs appropriate? Yes . Are the premedications appropriate for the patient's regimen? Yes . Prior Authorization for treatment is: Pending o If applicable, is the correct biosimilar selected based on the patient's insurance? not applicable  Organ Function and Labs: Marland Kitchen Are dose adjustments needed based on the patient's renal function, hepatic function, or hematologic function? Yes . Are appropriate labs ordered prior to the start of patient's treatment? Yes . Other organ system assessment, if indicated: N/A . The following baseline labs, if indicated, have been ordered: ramucirumab: urine protein  Dose Assessment: . Are the drug doses appropriate? Yes . Are the following correct: o Drug concentrations Yes o IV fluid compatible with drug Yes o Administration routes Yes o Timing of therapy Yes . If applicable, does the patient have documented access for treatment and/or plans for port-a-cath placement? yes . If applicable, have lifetime cumulative doses been properly documented and assessed? yes Lifetime Dose Tracking  No doses have been documented on this patient for the following tracked chemicals: Doxorubicin, Epirubicin, Idarubicin, Daunorubicin, Mitoxantrone, Bleomycin, Oxaliplatin, Carboplatin, Liposomal Doxorubicin  o   Toxicity Monitoring/Prevention: . The patient has the following take home antiemetics prescribed: Ondansetron and Prochlorperazine . The patient has the following take home medications prescribed: N/A . Medication allergies and previous infusion related reactions, if applicable, have been reviewed and addressed. Yes . The patient's current medication list has been  assessed for drug-drug interactions with their chemotherapy regimen. no significant drug-drug interactions were identified on review.  Order Review: . Are the treatment plan orders signed? No . Is the patient scheduled to see a provider prior to their treatment? Yes  I verify that I have reviewed each item in the above checklist and answered each question accordingly.  Romualdo Bolk [facility] 08/29/2019 8:23 AM

## 2019-08-31 ENCOUNTER — Other Ambulatory Visit: Payer: Self-pay | Admitting: Oncology

## 2019-09-02 ENCOUNTER — Telehealth: Payer: Self-pay | Admitting: Oncology

## 2019-09-02 NOTE — Telephone Encounter (Signed)
Called and spoke with patient. Confirmed all upcoming appts  

## 2019-09-03 ENCOUNTER — Ambulatory Visit: Payer: Medicare Other | Admitting: Nurse Practitioner

## 2019-09-03 ENCOUNTER — Other Ambulatory Visit: Payer: Medicare Other

## 2019-09-04 ENCOUNTER — Inpatient Hospital Stay: Payer: Medicare Other

## 2019-09-04 ENCOUNTER — Encounter: Payer: Self-pay | Admitting: Nurse Practitioner

## 2019-09-04 ENCOUNTER — Other Ambulatory Visit: Payer: Self-pay

## 2019-09-04 ENCOUNTER — Inpatient Hospital Stay (HOSPITAL_BASED_OUTPATIENT_CLINIC_OR_DEPARTMENT_OTHER): Payer: Medicare Other | Admitting: Nurse Practitioner

## 2019-09-04 VITALS — BP 107/75 | HR 81 | Temp 97.8°F | Resp 18 | Wt 212.7 lb

## 2019-09-04 VITALS — BP 121/67 | HR 86 | Temp 99.1°F | Resp 16

## 2019-09-04 DIAGNOSIS — R1310 Dysphagia, unspecified: Secondary | ICD-10-CM | POA: Diagnosis not present

## 2019-09-04 DIAGNOSIS — R0602 Shortness of breath: Secondary | ICD-10-CM | POA: Diagnosis not present

## 2019-09-04 DIAGNOSIS — Z5111 Encounter for antineoplastic chemotherapy: Secondary | ICD-10-CM | POA: Diagnosis present

## 2019-09-04 DIAGNOSIS — Z853 Personal history of malignant neoplasm of breast: Secondary | ICD-10-CM | POA: Diagnosis not present

## 2019-09-04 DIAGNOSIS — R808 Other proteinuria: Secondary | ICD-10-CM

## 2019-09-04 DIAGNOSIS — C169 Malignant neoplasm of stomach, unspecified: Secondary | ICD-10-CM

## 2019-09-04 DIAGNOSIS — G629 Polyneuropathy, unspecified: Secondary | ICD-10-CM | POA: Diagnosis not present

## 2019-09-04 DIAGNOSIS — M329 Systemic lupus erythematosus, unspecified: Secondary | ICD-10-CM | POA: Diagnosis not present

## 2019-09-04 DIAGNOSIS — Z86718 Personal history of other venous thrombosis and embolism: Secondary | ICD-10-CM | POA: Diagnosis not present

## 2019-09-04 DIAGNOSIS — Z7901 Long term (current) use of anticoagulants: Secondary | ICD-10-CM | POA: Diagnosis not present

## 2019-09-04 DIAGNOSIS — Z95828 Presence of other vascular implants and grafts: Secondary | ICD-10-CM

## 2019-09-04 DIAGNOSIS — D709 Neutropenia, unspecified: Secondary | ICD-10-CM | POA: Diagnosis not present

## 2019-09-04 DIAGNOSIS — I10 Essential (primary) hypertension: Secondary | ICD-10-CM | POA: Diagnosis not present

## 2019-09-04 DIAGNOSIS — I7300 Raynaud's syndrome without gangrene: Secondary | ICD-10-CM | POA: Diagnosis not present

## 2019-09-04 DIAGNOSIS — Z5112 Encounter for antineoplastic immunotherapy: Secondary | ICD-10-CM | POA: Diagnosis present

## 2019-09-04 LAB — TOTAL PROTEIN, URINE DIPSTICK: Protein, ur: 100 mg/dL — AB

## 2019-09-04 LAB — CMP (CANCER CENTER ONLY)
ALT: 17 U/L (ref 0–44)
AST: 18 U/L (ref 15–41)
Albumin: 3.3 g/dL — ABNORMAL LOW (ref 3.5–5.0)
Alkaline Phosphatase: 79 U/L (ref 38–126)
Anion gap: 7 (ref 5–15)
BUN: 9 mg/dL (ref 6–20)
CO2: 27 mmol/L (ref 22–32)
Calcium: 8.5 mg/dL — ABNORMAL LOW (ref 8.9–10.3)
Chloride: 106 mmol/L (ref 98–111)
Creatinine: 0.79 mg/dL (ref 0.44–1.00)
GFR, Est AFR Am: >60 mL/min (ref >60)
GFR, Estimated: >60 mL/min (ref >60)
Glucose, Bld: 98 mg/dL (ref 70–99)
Potassium: 3.6 mmol/L (ref 3.5–5.1)
Sodium: 140 mmol/L (ref 135–145)
Total Bilirubin: 0.3 mg/dL (ref 0.3–1.2)
Total Protein: 6.7 g/dL (ref 6.5–8.1)

## 2019-09-04 LAB — URINALYSIS, ROUTINE W REFLEX MICROSCOPIC
Bilirubin Urine: NEGATIVE
Glucose, UA: NEGATIVE mg/dL
Ketones, ur: 5 mg/dL — AB
Nitrite: POSITIVE — AB
Protein, ur: >=300 mg/dL — AB
RBC / HPF: >50 RBC/hpf — ABNORMAL HIGH (ref 0–5)
Specific Gravity, Urine: 1.03 (ref 1.005–1.030)
pH: 5 (ref 5.0–8.0)

## 2019-09-04 LAB — CBC WITH DIFFERENTIAL (CANCER CENTER ONLY)
Abs Immature Granulocytes: 0.02 10*3/uL (ref 0.00–0.07)
Basophils Absolute: 0 10*3/uL (ref 0.0–0.1)
Basophils Relative: 0 %
Eosinophils Absolute: 0 10*3/uL (ref 0.0–0.5)
Eosinophils Relative: 1 %
HCT: 33.8 % — ABNORMAL LOW (ref 36.0–46.0)
Hemoglobin: 11.3 g/dL — ABNORMAL LOW (ref 12.0–15.0)
Immature Granulocytes: 1 %
Lymphocytes Relative: 41 %
Lymphs Abs: 1.3 10*3/uL (ref 0.7–4.0)
MCH: 32.9 pg (ref 26.0–34.0)
MCHC: 33.4 g/dL (ref 30.0–36.0)
MCV: 98.5 fL (ref 80.0–100.0)
Monocytes Absolute: 0.7 10*3/uL (ref 0.1–1.0)
Monocytes Relative: 20 %
Neutro Abs: 1.2 10*3/uL — ABNORMAL LOW (ref 1.7–7.7)
Neutrophils Relative %: 37 %
Platelet Count: 262 10*3/uL (ref 150–400)
RBC: 3.43 MIL/uL — ABNORMAL LOW (ref 3.87–5.11)
RDW: 13.9 % (ref 11.5–15.5)
WBC Count: 3.3 10*3/uL — ABNORMAL LOW (ref 4.0–10.5)
nRBC: 0 % (ref 0.0–0.2)

## 2019-09-04 LAB — CEA (IN HOUSE-CHCC): CEA (CHCC-In House): 3.45 ng/mL (ref 0.00–5.00)

## 2019-09-04 MED ORDER — SODIUM CHLORIDE 0.9 % IV SOLN
20.0000 mg | Freq: Once | INTRAVENOUS | Status: AC
  Administered 2019-09-04: 20 mg via INTRAVENOUS
  Filled 2019-09-04: qty 20

## 2019-09-04 MED ORDER — FAMOTIDINE IN NACL 20-0.9 MG/50ML-% IV SOLN
20.0000 mg | Freq: Once | INTRAVENOUS | Status: AC
  Administered 2019-09-04: 20 mg via INTRAVENOUS

## 2019-09-04 MED ORDER — CIPROFLOXACIN HCL 500 MG PO TABS
500.0000 mg | ORAL_TABLET | Freq: Two times a day (BID) | ORAL | 0 refills | Status: AC
Start: 2019-09-04 — End: 2019-09-09

## 2019-09-04 MED ORDER — DIPHENHYDRAMINE HCL 50 MG/ML IJ SOLN
INTRAMUSCULAR | Status: AC
  Filled 2019-09-04: qty 1

## 2019-09-04 MED ORDER — SODIUM CHLORIDE 0.9% FLUSH
10.0000 mL | INTRAVENOUS | Status: DC | PRN
  Administered 2019-09-04: 10 mL
  Filled 2019-09-04: qty 10

## 2019-09-04 MED ORDER — HEPARIN SOD (PORK) LOCK FLUSH 100 UNIT/ML IV SOLN
500.0000 [IU] | Freq: Once | INTRAVENOUS | Status: AC | PRN
  Administered 2019-09-04: 500 [IU]
  Filled 2019-09-04: qty 5

## 2019-09-04 MED ORDER — SODIUM CHLORIDE 0.9 % IV SOLN
Freq: Once | INTRAVENOUS | Status: AC
  Filled 2019-09-04: qty 250

## 2019-09-04 MED ORDER — SODIUM CHLORIDE 0.9% FLUSH
10.0000 mL | INTRAVENOUS | Status: DC | PRN
  Administered 2019-09-04: 10 mL via INTRAVENOUS
  Filled 2019-09-04: qty 10

## 2019-09-04 MED ORDER — DIPHENHYDRAMINE HCL 50 MG/ML IJ SOLN
25.0000 mg | Freq: Once | INTRAMUSCULAR | Status: AC
  Administered 2019-09-04: 25 mg via INTRAVENOUS

## 2019-09-04 MED ORDER — SODIUM CHLORIDE 0.9 % IV SOLN
8.0000 mg/kg | Freq: Once | INTRAVENOUS | Status: AC
  Administered 2019-09-04: 800 mg via INTRAVENOUS
  Filled 2019-09-04: qty 50

## 2019-09-04 MED ORDER — FAMOTIDINE IN NACL 20-0.9 MG/50ML-% IV SOLN
INTRAVENOUS | Status: AC
  Filled 2019-09-04: qty 50

## 2019-09-04 MED ORDER — SODIUM CHLORIDE 0.9 % IV SOLN
80.0000 mg/m2 | Freq: Once | INTRAVENOUS | Status: AC
  Administered 2019-09-04: 168 mg via INTRAVENOUS
  Filled 2019-09-04: qty 28

## 2019-09-04 NOTE — Progress Notes (Signed)
Patient with urine protein of 100 today. Okay to proceed with treatment per Ned Card, NP while UA is collected for patient.   Leron Croak, PharmD, BCPS PGY2 Hematology/Oncology Pharmacy Resident 09/04/2019 12:04 PM

## 2019-09-04 NOTE — Patient Instructions (Signed)
[facility] Discharge Instructions for Patients Receiving Chemotherapy  Today you received the following chemotherapy agents: Cyramza & Taxol  To help prevent nausea and vomiting after your treatment, we encourage you to take your nausea medication as prescribed.   If you develop nausea and vomiting that is not controlled by your nausea medication, call the clinic.   BELOW ARE SYMPTOMS THAT SHOULD BE REPORTED IMMEDIATELY:  *FEVER GREATER THAN 100.5 F  *CHILLS WITH OR WITHOUT FEVER  NAUSEA AND VOMITING THAT IS NOT CONTROLLED WITH YOUR NAUSEA MEDICATION  *UNUSUAL SHORTNESS OF BREATH  *UNUSUAL BRUISING OR BLEEDING  TENDERNESS IN MOUTH AND THROAT WITH OR WITHOUT PRESENCE OF ULCERS  *URINARY PROBLEMS  *BOWEL PROBLEMS  UNUSUAL RASH Items with * indicate a potential emergency and should be followed up as soon as possible.  Feel free to call the clinic should you have any questions or concerns. The clinic phone number is [phone].  Please show the CHEMO ALERT CARD at check-in to the Emergency Department and triage nurse.    Paclitaxel injection What is this medicine? PACLITAXEL (PAK li TAX el) is a chemotherapy drug. It targets fast dividing cells, like cancer cells, and causes these cells to die. This medicine is used to treat ovarian cancer, breast cancer, lung cancer, Kaposi's sarcoma, and other cancers. This medicine may be used for other purposes; ask your health care provider or pharmacist if you have questions. COMMON BRAND NAME(S): Onxol, Taxol What should I tell my health care provider before I take this medicine? They need to know if you have any of these conditions:  history of irregular heartbeat  liver disease  low blood counts, like low white cell, platelet, or red cell counts  lung or breathing disease, like asthma  tingling of the fingers or toes, or other nerve disorder  an unusual or allergic reaction to paclitaxel, alcohol,  polyoxyethylated castor oil, other chemotherapy, other medicines, foods, dyes, or preservatives  pregnant or trying to get pregnant  breast-feeding How should I use this medicine? This drug is given as an infusion into a vein. It is administered in a hospital or clinic by a specially trained health care professional. Talk to your pediatrician regarding the use of this medicine in children. Special care may be needed. Overdosage: If you think you have taken too much of this medicine contact a poison control center or emergency room at once. NOTE: This medicine is only for you. Do not share this medicine with others. What if I miss a dose? It is important not to miss your dose. Call your doctor or health care professional if you are unable to keep an appointment. What may interact with this medicine? Do not take this medicine with any of the following medications:  disulfiram  metronidazole This medicine may also interact with the following medications:  antiviral medicines for hepatitis, HIV or AIDS  certain antibiotics like erythromycin and clarithromycin  certain medicines for fungal infections like ketoconazole and itraconazole  certain medicines for seizures like carbamazepine, phenobarbital, phenytoin  gemfibrozil  nefazodone  rifampin  St. John's wort This list may not describe all possible interactions. Give your health care provider a list of all the medicines, herbs, non-prescription drugs, or dietary supplements you use. Also tell them if you smoke, drink alcohol, or use illegal drugs. Some items may interact with your medicine. What should I watch for while using this medicine? Your condition will be monitored carefully while you are receiving this medicine. You will   need important blood work done while you are taking this medicine. This medicine can cause serious allergic reactions. To reduce your risk you will need to take other medicine(s) before treatment with this  medicine. If you experience allergic reactions like skin rash, itching or hives, swelling of the face, lips, or tongue, tell your doctor or health care professional right away. In some cases, you may be given additional medicines to help with side effects. Follow all directions for their use. This drug may make you feel generally unwell. This is not uncommon, as chemotherapy can affect healthy cells as well as cancer cells. Report any side effects. Continue your course of treatment even though you feel ill unless your doctor tells you to stop. Call your doctor or health care professional for advice if you get a fever, chills or sore throat, or other symptoms of a cold or flu. Do not treat yourself. This drug decreases your body's ability to fight infections. Try to avoid being around people who are sick. This medicine may increase your risk to bruise or bleed. Call your doctor or health care professional if you notice any unusual bleeding. Be careful brushing and flossing your teeth or using a toothpick because you may get an infection or bleed more easily. If you have any dental work done, tell your dentist you are receiving this medicine. Avoid taking products that contain aspirin, acetaminophen, ibuprofen, naproxen, or ketoprofen unless instructed by your doctor. These medicines may hide a fever. Do not become pregnant while taking this medicine. Women should inform their doctor if they wish to become pregnant or think they might be pregnant. There is a potential for serious side effects to an unborn child. Talk to your health care professional or pharmacist for more information. Do not breast-feed an infant while taking this medicine. Men are advised not to father a child while receiving this medicine. This product may contain alcohol. Ask your pharmacist or healthcare provider if this medicine contains alcohol. Be sure to tell all healthcare providers you are taking this medicine. Certain medicines,  like metronidazole and disulfiram, can cause an unpleasant reaction when taken with alcohol. The reaction includes flushing, headache, nausea, vomiting, sweating, and increased thirst. The reaction can last from 30 minutes to several hours. What side effects may I notice from receiving this medicine? Side effects that you should report to your doctor or health care professional as soon as possible:  allergic reactions like skin rash, itching or hives, swelling of the face, lips, or tongue  breathing problems  changes in vision  fast, irregular heartbeat  high or low blood pressure  mouth sores  pain, tingling, numbness in the hands or feet  signs of decreased platelets or bleeding - bruising, pinpoint red spots on the skin, black, tarry stools, blood in the urine  signs of decreased red blood cells - unusually weak or tired, feeling faint or lightheaded, falls  signs of infection - fever or chills, cough, sore throat, pain or difficulty passing urine  signs and symptoms of liver injury like dark yellow or brown urine; general ill feeling or flu-like symptoms; light-colored stools; loss of appetite; nausea; right upper belly pain; unusually weak or tired; yellowing of the eyes or skin  swelling of the ankles, feet, hands  unusually slow heartbeat Side effects that usually do not require medical attention (report to your doctor or health care professional if they continue or are bothersome):  diarrhea  hair loss  loss of appetite  muscle  or joint pain  nausea, vomiting  pain, redness, or irritation at site where injected  tiredness This list may not describe all possible side effects. Call your doctor for medical advice about side effects. You may report side effects to FDA at 1-800-FDA-1088. Where should I keep my medicine? This drug is given in a hospital or clinic and will not be stored at home. NOTE: This sheet is a summary. It may not cover all possible information.  If you have questions about this medicine, talk to your doctor, pharmacist, or health care provider.  2020 Elsevier/Gold Standard (2016-12-05 13:14:55)   Ramucirumab injection What is this medicine? RAMUCIRUMAB (ra mue SIR ue mab) is a monoclonal antibody. It is used to treat stomach cancer, colorectal cancer, liver cancer, and lung cancer. This medicine may be used for other purposes; ask your health care provider or pharmacist if you have questions. COMMON BRAND NAME(S): Cyramza What should I tell my health care provider before I take this medicine? They need to know if you have any of these conditions:  bleeding disorders  blood clots  heart disease, including heart failure, heart attack, or chest pain (angina)  high blood pressure  infection (especially a virus infection such as chickenpox, cold sores, or herpes)  protein in your urine  recent or planning to have surgery  stroke  an unusual or allergic reaction to ramucirumab, other medicines, foods, dyes, or preservatives  pregnant or trying to get pregnant  breast-feeding How should I use this medicine? This medicine is for infusion into a vein. It is given by a health care professional in a hospital or clinic setting. Talk to your pediatrician regarding the use of this medicine in children. Special care may be needed. Overdosage: If you think you have taken too much of this medicine contact a poison control center or emergency room at once. NOTE: This medicine is only for you. Do not share this medicine with others. What if I miss a dose? It is important not to miss your dose. Call your doctor or health care professional if you are unable to keep an appointment. What may interact with this medicine? Interactions have not been studied. This list may not describe all possible interactions. Give your health care provider a list of all the medicines, herbs, non-prescription drugs, or dietary supplements you use. Also  tell them if you smoke, drink alcohol, or use illegal drugs. Some items may interact with your medicine. What should I watch for while using this medicine? Your condition will be monitored carefully while you are receiving this medicine. You will need to to check your blood pressure and have your blood and urine tested while you are taking this medicine. Your condition will be monitored carefully while you are receiving this medicine. This medicine may increase your risk to bruise or bleed. Call your doctor or health care professional if you notice any unusual bleeding. Before having surgery, talk to your health care provider to make sure it is ok. This drug can increase the risk of poor healing of your surgical site or wound. You will need to stop this drug for 28 days before surgery. After surgery, wait at least 2 weeks before restarting this drug. Make sure the surgical site or wound is healed enough before restarting this drug. Talk to your health care provider if questions. Do not become pregnant while taking this medicine or for 3 months after stopping it. Women should inform their doctor if they wish to become pregnant   or think they might be pregnant. There is a potential for serious side effects to an unborn child. Talk to your health care professional or pharmacist for more information. Do not breast-feed an infant while taking this medicine or for 2 months after stopping it. This medicine may interfere with the ability to have a child. Talk with your doctor or health care professional if you are concerned about your fertility. What side effects may I notice from receiving this medicine? Side effects that you should report to your doctor or health care professional as soon as possible:  allergic reactions like skin rash, itching or hives, breathing problems, swelling of the face, lips, or tongue  signs of infection - fever or chills, cough, sore throat  chest pain or chest  tightness  confusion  dizziness  feeling faint or lightheaded, falls  severe abdominal pain  severe nausea, vomiting  signs and symptoms of bleeding such as bloody or black, tarry stools; red or dark-brown urine; spitting up blood or brown material that looks like coffee grounds; red spots on the skin; unusual bruising or bleeding from the eye, gums, or nose  signs and symptoms of a blood clot such as breathing problems; changes in vision; chest pain; severe, sudden headache; pain, swelling, warmth in the leg; trouble speaking; sudden numbness or weakness of the face, arm or leg  symptoms of a stroke: change in mental awareness, inability to talk or move one side of the body  trouble walking, dizziness, loss of balance or coordination Side effects that usually do not require medical attention (report to your doctor or health care professional if they continue or are bothersome):  cold, clammy skin  constipation  diarrhea  headache  nausea, vomiting  stomach pain  unusually slow heartbeat  unusually weak or tired This list may not describe all possible side effects. Call your doctor for medical advice about side effects. You may report side effects to FDA at 1-800-FDA-1088. Where should I keep my medicine? This drug is given in a hospital or clinic and will not be stored at home. NOTE: This sheet is a summary. It may not cover all possible information. If you have questions about this medicine, talk to your doctor, pharmacist, or health care provider.  2020 Elsevier/Gold Standard (2019-01-29 11:17:50)  

## 2019-09-04 NOTE — Progress Notes (Signed)
OK to treat with ANC 1.2 per NP Lattie Haw.  Ok to treat with positive (100) urine protein level per NP Lisa, UA ordered on urine sample.  Lab made aware of add on.  NP Lattie Haw made aware of urinalysis results, ordered UC.  Lab made aware of add on.  Pt instructed to start oral abx per NP, prescription sent to preferred pharmacy.  Pt tolerated cyramza and taxol first time infusions well, no reactions.  VSS.

## 2019-09-04 NOTE — Progress Notes (Addendum)
[facility] OFFICE PROGRESS NOTE   Diagnosis: Gastric cancer  INTERVAL HISTORY:   [name] returns as scheduled.  She completed a treatment with paclitaxel/ramucirumab 08/19/2019 at another facility.  She denies nausea/vomiting.  No mouth sores.  No diarrhea.  She has fairly consistent numbness/tingling in the fingertips and toes.  She has joint pain related to lupus.  Appetite is poor.  Objective:  Vital signs in last 24 hours:  Blood pressure 107/75, pulse 81, temperature 97.8 F (36.6 C), temperature source Oral, resp. rate 18, weight 212 lb 11.2 oz (96.5 kg), SpO2 100 %.    HEENT: No thrush or ulcers. Resp: Lungs clear bilaterally. Cardio: Regular rate and rhythm. GI: Abdomen soft and nontender.  No hepatomegaly.  No mass. Vascular: No leg edema. Neuro: Vibratory sense mildly decreased over the fingertips per tuning fork exam. Skin: Palms with mild erythema, dryness. Port-A-Cath without erythema.   Lab Results:  Lab Results  Component Value Date   WBC 3.3 (L) 09/04/2019   HGB 11.3 (L) 09/04/2019   HCT 33.8 (L) 09/04/2019   MCV 98.5 09/04/2019   PLT 262 09/04/2019   NEUTROABS 1.2 (L) 09/04/2019    Imaging:  No results found.  Medications: I have reviewed the patient's current medications.  Assessment/Plan: 1. Gastric cancer ? Presenting with dysphagia spring 2019 ? 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 CTs revealed wall thickening and luminal narrowing of the colon at the hepatic flexure and cecum ? PET scan with no evidence of distant metastatic disease or abnormal uptake other than thickening of the distal esophagus and proximal  stomach ? Colonoscopy 10/05/2017 at [facility] revealed multiple foci of thickening/masses at the cecum, hepatic flexure, and distal rectum, biopsy from the cecum and hepatic flexure revealed metastatic adenocarcinoma of gastric origin, 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, PD-L1 combined positive score 2 on hepatic flexure biopsy ? CTs 10/23/2017-diffuse prominence of the gastric wall, especially the antrum, focal wall thickening of the distal ascending colon and hepatic flexure, thickening of the cecum, nonspecific haziness of the mesenteric fat in the pelvis, mild prominence of lymph nodes at the greater curvature of the stomach ? Treatment with FOLFOX beginning July 2019 ? CTs October 2019-stable disease, FOLFOX continued ? CTs December 2019-stable disease ? January 2020 treatment transition to Xeloda maintenance, care transition to [place] ? CTs in June 2020 in September 2020-stable disease, Xeloda continued ? CTs 06/23/2019-nonspecific thickening of the GE junction, intraluminal bladder mass ? Cystoscopy-metastatic signet ring cell adenocarcinoma ? Cycle 1 day 1 ramucirumab/paclitaxel 08/19/2019 (given at another facility) ? Cycle 1 day 1 ramucirumab/paclitaxel 09/04/2019  2.  Left breast cancer 2008,pT1c,pN0, status post a left lumpectomy with adjuvant chemotherapy and radiation, ER positive, PR positive, HER-2 positive  3.  Mixed connective tissue disease/SLE  4.  Lower extremity deep vein thrombosis maintained on apixaban  5.  Family history of multiple cancers including breast and ovarian cancer  6.  Dysphagia and intermittent vomiting secondary to #1  7.  Hypertension  8.  Peripheral neuropathy  9.  Masslike fullness at the posterior right parotid/angle of the jaw   Disposition: [name] appears stable.  She completed cycle 1 day 1 ramucirumab/paclitaxel 08/19/2019 at  another  facility.  She reports tolerating the first treatment well.  Plan to continue the same treatment here, begin a new cycle today.  We reviewed the CBC from today.  Counts adequate to proceed as above.  She has mild to moderate neutropenia.  Precautions reviewed.  She understands to contact the office with fever, chills, other signs of infection.  Repeat CBC prior to treatment in 1 week.  She will return for lab and Taxol in 1 week.  We will see her in follow-up in 2 weeks.  She will contact the office in the interim as outlined above or with any other problems.  Patient seen with Dr. Benay Spice.    Ned Card ANP/GNP-BC   09/04/2019  10:28 AM This was a shared visit with Ned Card.  [name] will begin another cycle of ramucirumab/paclitaxel today.  The urinalysis today is consistent with a urinary tract infection.  She will be treated with antibiotics.  She has mild neutropenia.  We discussed delaying treatment.  She would like to proceed with chemotherapy today.  She understands the risk of infection and will call for a fever.  She will return for day 8 Taxol next week.  The Taxol will be held if the white count is lower.  We continue to review records from the cancer treatment centers of [place].  Julieanne Manson, MD

## 2019-09-05 ENCOUNTER — Telehealth: Payer: Self-pay | Admitting: Oncology

## 2019-09-05 ENCOUNTER — Telehealth: Payer: Self-pay | Admitting: *Deleted

## 2019-09-05 LAB — URINE CULTURE: Culture: 30000 — AB

## 2019-09-05 NOTE — Telephone Encounter (Signed)
Scheduled per 5/20 los. Pt aware of appts added on 5/26.

## 2019-09-06 ENCOUNTER — Other Ambulatory Visit: Payer: Self-pay | Admitting: Oncology

## 2019-09-07 ENCOUNTER — Emergency Department (HOSPITAL_COMMUNITY)
Admission: EM | Admit: 2019-09-07 | Discharge: 2019-09-07 | Disposition: A | Discharge status: Home / Self Care | Payer: Medicare Other | Attending: Emergency Medicine | Admitting: Emergency Medicine

## 2019-09-07 ENCOUNTER — Other Ambulatory Visit: Payer: Self-pay

## 2019-09-07 ENCOUNTER — Encounter (HOSPITAL_COMMUNITY): Payer: Self-pay | Admitting: *Deleted

## 2019-09-07 ENCOUNTER — Emergency Department (HOSPITAL_COMMUNITY): Payer: Medicare Other

## 2019-09-07 DIAGNOSIS — Z9221 Personal history of antineoplastic chemotherapy: Secondary | ICD-10-CM | POA: Diagnosis not present

## 2019-09-07 DIAGNOSIS — R0602 Shortness of breath: Secondary | ICD-10-CM | POA: Diagnosis present

## 2019-09-07 DIAGNOSIS — Z7982 Long term (current) use of aspirin: Secondary | ICD-10-CM | POA: Diagnosis not present

## 2019-09-07 DIAGNOSIS — C189 Malignant neoplasm of colon, unspecified: Secondary | ICD-10-CM | POA: Diagnosis not present

## 2019-09-07 DIAGNOSIS — Z7901 Long term (current) use of anticoagulants: Secondary | ICD-10-CM | POA: Diagnosis not present

## 2019-09-07 DIAGNOSIS — C169 Malignant neoplasm of stomach, unspecified: Secondary | ICD-10-CM | POA: Diagnosis not present

## 2019-09-07 DIAGNOSIS — Z79899 Other long term (current) drug therapy: Secondary | ICD-10-CM | POA: Diagnosis not present

## 2019-09-07 LAB — CBC WITH DIFFERENTIAL/PLATELET
Abs Immature Granulocytes: 0.01 10*3/uL (ref 0.00–0.07)
Basophils Absolute: 0 10*3/uL (ref 0.0–0.1)
Basophils Relative: 0 %
Eosinophils Absolute: 0.1 10*3/uL (ref 0.0–0.5)
Eosinophils Relative: 2 %
HCT: 35.5 % — ABNORMAL LOW (ref 36.0–46.0)
Hemoglobin: 11.8 g/dL — ABNORMAL LOW (ref 12.0–15.0)
Immature Granulocytes: 0 %
Lymphocytes Relative: 35 %
Lymphs Abs: 1.4 10*3/uL (ref 0.7–4.0)
MCH: 32.9 pg (ref 26.0–34.0)
MCHC: 33.2 g/dL (ref 30.0–36.0)
MCV: 98.9 fL (ref 80.0–100.0)
Monocytes Absolute: 0.1 10*3/uL (ref 0.1–1.0)
Monocytes Relative: 4 %
Neutro Abs: 2.4 10*3/uL (ref 1.7–7.7)
Neutrophils Relative %: 59 %
Platelets: 242 10*3/uL (ref 150–400)
RBC: 3.59 MIL/uL — ABNORMAL LOW (ref 3.87–5.11)
RDW: 13.5 % (ref 11.5–15.5)
WBC: 4 10*3/uL (ref 4.0–10.5)
nRBC: 0 % (ref 0.0–0.2)

## 2019-09-07 LAB — COMPREHENSIVE METABOLIC PANEL
ALT: 40 U/L (ref 0–44)
AST: 42 U/L — ABNORMAL HIGH (ref 15–41)
Albumin: 3.5 g/dL (ref 3.5–5.0)
Alkaline Phosphatase: 62 U/L (ref 38–126)
Anion gap: 8 (ref 5–15)
BUN: 16 mg/dL (ref 6–20)
CO2: 27 mmol/L (ref 22–32)
Calcium: 8.4 mg/dL — ABNORMAL LOW (ref 8.9–10.3)
Chloride: 103 mmol/L (ref 98–111)
Creatinine, Ser: 0.77 mg/dL (ref 0.44–1.00)
GFR calc Af Amer: >60 mL/min (ref >60)
GFR calc non Af Amer: >60 mL/min (ref >60)
Glucose, Bld: 102 mg/dL — ABNORMAL HIGH (ref 70–99)
Potassium: 4.1 mmol/L (ref 3.5–5.1)
Sodium: 138 mmol/L (ref 135–145)
Total Bilirubin: 0.8 mg/dL (ref 0.3–1.2)
Total Protein: 6.4 g/dL — ABNORMAL LOW (ref 6.5–8.1)

## 2019-09-07 LAB — BRAIN NATRIURETIC PEPTIDE: B Natriuretic Peptide: 16.6 pg/mL (ref 0.0–100.0)

## 2019-09-07 LAB — TROPONIN I (HIGH SENSITIVITY): Troponin I (High Sensitivity): 3 ng/L (ref <18)

## 2019-09-07 MED ORDER — HEPARIN SOD (PORK) LOCK FLUSH 100 UNIT/ML IV SOLN
500.0000 [IU] | Freq: Once | INTRAVENOUS | Status: AC
  Administered 2019-09-07: 500 [IU]
  Filled 2019-09-07: qty 5

## 2019-09-07 NOTE — ED Triage Notes (Signed)
Patient reporting shortness of breath with walking since receiving her chemo treatment last Thursday.

## 2019-09-07 NOTE — ED Provider Notes (Signed)
[facility] DEPT Provider Note   CSN: [id number] Arrival date & time: 09/07/19  2027     History Chief Complaint  Patient presents with  . Shortness of Breath    Michelle Cain is a 54 y.o. female history of lupus, Raynaud's, stomach and colon cancer on chemotherapy here presenting with shortness of breath. Patient just received chemotherapy 3 days ago.  She states that for the last several days, she has some shortness of breath with exertion.  Denies any leg swelling.  She states that she is on Eliquis for previous DVT .  Patient denies any heart problems.   The history is provided by the patient.       Past Medical History:  Diagnosis Date  . Cancer ([place])   . Lupus ([place])   . Raynaud disease     Patient Active Problem List   Diagnosis Date Noted  . Malignant neoplasm of stomach ([place]) 08/26/2019  . Goals of care, counseling/discussion 08/26/2019  . Plantar fasciitis 07/31/2012  . Right knee pain 05/28/2012    Past Surgical History:  Procedure Laterality Date  . BREAST SURGERY    . CHOLECYSTECTOMY       OB History   No obstetric history on file.     Family History  Problem Relation Age of Onset  . Sudden death Neg Hx   . Hypertension Neg Hx   . Hyperlipidemia Neg Hx   . Heart attack Neg Hx   . Diabetes Neg Hx     Social History   Tobacco Use  . Smoking status: Never Smoker  Substance Use Topics  . Alcohol use: No  . Drug use: No    Home Medications Prior to Admission medications   Medication Sig Start Date End Date Taking? Authorizing Provider  amLODipine (NORVASC) 5 MG tablet Take 5 mg by mouth daily.    [provider]  apixaban (ELIQUIS) 5 MG TABS tablet Take 5 mg by mouth in the morning and at bedtime. 05/16/18   [provider]  aspirin EC 81 MG tablet Take 81 mg by mouth daily.    [provider]  azaTHIOprine (IMURAN) 50 MG tablet Take 50 mg by mouth 2 (two) times daily.    [provider]  Cholecalciferol 50 MCG (2000 UT) CAPS Take 2,000 Units by mouth daily.    [provider]  ciprofloxacin (CIPRO) 500 MG tablet Take 1 tablet (500 mg total) by mouth 2 (two) times daily for 5 days. 09/04/19 09/09/19  Owens Shark, NP  docusate sodium (COLACE) 100 MG capsule Take 100 mg by mouth at bedtime.    [provider]  furosemide (LASIX) 20 MG tablet Take 20 mg by mouth daily as needed. 11/10/14   [provider]  gabapentin (NEURONTIN) 300 MG capsule Take 300 mg by mouth in the morning and at bedtime. 05/29/18   [provider]  HYDROcodone-acetaminophen (NORCO) 10-325 MG tablet Take 1 tablet by mouth every 6 (six) hours as needed. 10/22/17   [provider]  hydroxychloroquine (PLAQUENIL) 200 MG tablet Take 200 mg by mouth 2 (two) times daily.    [provider]  loperamide (IMODIUM) 2 MG capsule Take 2 mg by mouth as needed. 10/23/17   [provider]  meloxicam (MOBIC) 15 MG tablet Take 1 tablet (15 mg total) by mouth daily. 08/08/12   Hudnall, Sharyn Lull, MD  metoCLOPramide (REGLAN) 10 MG tablet Take 10 mg by mouth 3 (three) times daily  before meals. 07/22/19   [provider]  omeprazole (PRILOSEC) 20 MG capsule Take 20 mg by mouth daily.    [provider]  ondansetron (ZOFRAN) 8 MG tablet Take 8 mg by mouth as needed. 10/23/17   [provider]  Polyethylene Glycol 3350 (MIRALAX PO) Take 17 g by mouth in the morning and at bedtime.    [provider]  potassium chloride (KLOR-CON) 10 MEQ tablet Take 10 mEq by mouth daily. 11/10/14   [provider]  prochlorperazine (COMPAZINE) 10 MG tablet Take 10 mg by mouth every 6 (six) hours as needed. 10/23/17   [provider]  traMADol (ULTRAM) 50 MG tablet Take 1 tablet (50 mg total) by mouth every 8 (eight) hours as needed for pain. 07/30/12   Dene Gentry, MD    Allergies    Patient has no known allergies.  Review of  Systems   Review of Systems  Respiratory: Positive for shortness of breath.   All other systems reviewed and are negative.   Physical Exam Updated Vital Signs BP 127/83 (BP Location: Right Arm)   Pulse 85   Temp 98.3 F (36.8 C) (Oral)   Resp 11   SpO2 96%   Physical Exam Vitals and nursing note reviewed.  Constitutional:      Comments: Chronically ill   HENT:     Head: Normocephalic.     Mouth/Throat:     Mouth: Mucous membranes are moist.  Eyes:     Pupils: Pupils are equal, round, and reactive to light.  Cardiovascular:     Rate and Rhythm: Normal rate and regular rhythm.  Pulmonary:     Comments: Crackles bilateral bases. Port in L chest in place with no obvious infection  Abdominal:     General: Bowel sounds are normal.     Palpations: Abdomen is soft.  Musculoskeletal:        General: Normal range of motion.     Cervical back: Normal range of motion and neck supple.  Skin:    General: Skin is warm.     Capillary Refill: Capillary refill takes less than 2 seconds.  Neurological:     General: No focal deficit present.     Mental Status: She is alert and oriented to person, place, and time.  Psychiatric:        Mood and Affect: Mood normal.        Behavior: Behavior normal.     ED Results / Procedures / Treatments   Labs (all labs ordered are listed, but only abnormal results are displayed) Labs Reviewed  CBC WITH DIFFERENTIAL/PLATELET  COMPREHENSIVE METABOLIC PANEL  BRAIN NATRIURETIC PEPTIDE  TROPONIN I (HIGH SENSITIVITY)    EKG EKG Interpretation  Date/Time:  Sunday Sep 07 2019 20:42:27 EDT Ventricular Rate:  89 PR Interval:    QRS Duration: 81 QT Interval:  356 QTC Calculation: 434 R Axis:   26 Text Interpretation: Sinus rhythm Probable left atrial enlargement 12 Lead; [place] No previous ECGs available Confirmed by Wandra Arthurs [phone]) on 09/07/2019 9:26:36 PM   Radiology DG Chest Port 1 View  Result Date: 09/07/2019 CLINICAL DATA:   Stomach and colon cancer, weakness and shortness of breath since chemotherapy EXAM: PORTABLE CHEST 1 VIEW COMPARISON:  None. FINDINGS: Single frontal view of the chest demonstrates an unremarkable cardiac silhouette. Left chest wall port tip overlies superior vena cava. No airspace disease, effusion, or pneumothorax. IMPRESSION: 1. No acute intrathoracic process. Electronically Signed   By:  Randa Ngo M.D.   On: 09/07/2019 21:35    Procedures Procedures (including critical care time)  Medications Ordered in ED Medications - No data to display  ED Course  I have reviewed the triage vital signs and the nursing notes.  Pertinent labs & imaging results that were available during my care of the patient were reviewed by me and considered in my medical decision making (see chart for details).    MDM Rules/Calculators/A&P                      Michelle Cain is a 54 y.o. female history of colon and stomach cancer on chemotherapy here presenting with shortness of breath. SOB with exertion. Consider CHF vs ACS vs cardiac toxicity from chemo. Will get CBC, CMP, trop, BNP, CXR.   10:58 PM Labs unremarkable. Trop neg x 1 and symptoms for several days and I have low suspicion for ACS. BNP normal. CXR clear.  He may have some side effect from her chemotherapy.  Stable for discharge for outpatient follow-up.  Final Clinical Impression(s) / ED Diagnoses Final diagnoses:  None    Rx / DC Orders ED Discharge Orders    None       Drenda Freeze, MD 09/07/19 2303

## 2019-09-07 NOTE — Discharge Instructions (Signed)
Continue your current meds   See your oncologist for follow up   Return to ER if you have worse shortness of breath, chest pain, fever, cough.

## 2019-09-09 NOTE — Progress Notes (Signed)
Patient calls to let us know that she was seen in [facility] ED on 5/23 for work up for SOB mainly with ambulation and exertion.  She states the work up was fine and did not reveal any new findings.  She wondered if she should be seen by NP prior to her treatment on 5/26.  I consulted with Dr. Benay Spice and he states to add her to Cira Rue NP scheduled.  I scheduled her at 8:45 with NP.  I have called the patient back to let her know.

## 2019-09-09 NOTE — Progress Notes (Addendum)
[facility]   [phone] [phone]   Clinic Follow up Note   Patient Care Team: Laurette Schimke, DO as PCP - General (Internal Medicine) Jonnie Finner, RN as Oncology Nurse Navigator Ladell Pier, MD as Consulting Physician (Oncology) 09/10/2019  CHIEF COMPLAINT: F/u gastric cancer, dyspnea   CURRENT THERAPY: Paclitaxel D1, 8, 15 and Ramucirumab on D1, 15 Q28 days   INTERVAL HISTORY: [name] returns for f/u and treatment as scheduled. She was last seen 09/04/19 and started taxol/ramucirumab. She presented to ED on 09/07/19 with worsening shortness of breath. Labs were unremarkable, Troponin and BNP were normal. CXR was clear. She was felt to be experiencing chemo side effects and was discharged home.   Today she presents in a wheelchair. She reports a 1 month h/o exertional dyspnea that worsened gradually after chemo last week. She has difficulty with ADLs including standing at the sink or walking stairs. She tries to be up at home. Dyspnea improves after 1-2 minutes with rest, but she feels like it's getting worse overall. She has occasional dry cough for 2 weeks. Her legs appear unchanged, she remains on eliquis. Denies bleeding or issues with complaince.  She has not been on lasix in a long time. No fever, chills. She does not have much fatigue. She denies n/v/c/d or abdominal pain. Her lupus pain is "not bad today." Neuropathy is stable on current dose gabapentin. Denies mucositis or rash.    MEDICAL HISTORY:  Past Medical History:  Diagnosis Date  . Cancer ([place])   . Lupus ([place])   . Raynaud disease     SURGICAL HISTORY: Past Surgical History:  Procedure Laterality Date  . BREAST SURGERY    . CHOLECYSTECTOMY      I have reviewed the social history and family history with the patient and they are unchanged from previous note.  ALLERGIES:  has No Known Allergies.  MEDICATIONS:  Current Outpatient Medications  Medication  Sig Dispense Refill  . amLODipine (NORVASC) 5 MG tablet Take 5 mg by mouth daily.    Marland Kitchen apixaban (ELIQUIS) 5 MG TABS tablet Take 5 mg by mouth in the morning and at bedtime.    Marland Kitchen aspirin EC 81 MG tablet Take 81 mg by mouth daily.    Marland Kitchen azaTHIOprine (IMURAN) 50 MG tablet Take 50 mg by mouth 2 (two) times daily.    . Cholecalciferol 50 MCG (2000 UT) CAPS Take 2,000 Units by mouth daily.    Marland Kitchen docusate sodium (COLACE) 100 MG capsule Take 100 mg by mouth at bedtime.    . gabapentin (NEURONTIN) 300 MG capsule Take 300 mg by mouth in the morning and at bedtime.    Marland Kitchen HYDROcodone-acetaminophen (NORCO) 10-325 MG tablet Take 1 tablet by mouth every 6 (six) hours as needed for moderate pain or severe pain.     . hydroxychloroquine (PLAQUENIL) 200 MG tablet Take 200 mg by mouth 2 (two) times daily.    Marland Kitchen loperamide (IMODIUM) 2 MG capsule Take 2 mg by mouth as needed for diarrhea or loose stools.     . meloxicam (MOBIC) 15 MG tablet Take 1 tablet (15 mg total) by mouth daily. 30 tablet 1  . metoCLOPramide (REGLAN) 10 MG tablet Take 10 mg by mouth 3 (three) times daily before meals.    Marland Kitchen omeprazole (PRILOSEC) 20 MG capsule Take 20 mg by mouth daily.    . ondansetron (ZOFRAN) 8 MG tablet Take 8 mg by mouth every 8 (eight) hours  as needed for nausea or vomiting.     . Polyethylene Glycol 3350 (MIRALAX PO) Take 17 g by mouth in the morning and at bedtime.    . potassium chloride (KLOR-CON) 10 MEQ tablet Take 10 mEq by mouth daily.    . prochlorperazine (COMPAZINE) 10 MG tablet Take 10 mg by mouth every 6 (six) hours as needed for nausea or vomiting.     . traMADol (ULTRAM) 50 MG tablet Take 1 tablet (50 mg total) by mouth every 8 (eight) hours as needed for pain. 90 tablet 1  . furosemide (LASIX) 20 MG tablet Take 20 mg by mouth daily as needed for fluid or edema.      No current facility-administered medications for this visit.   Facility-Administered Medications Ordered in Other Visits  Medication Dose Route  Frequency Provider Last Rate Last Admin  . heparin lock flush 100 unit/mL  500 Units Intracatheter Once PRN Ladell Pier, MD      . PACLitaxel (TAXOL) 126 mg in sodium chloride 0.9 % 250 mL chemo infusion (</= 68m/m2)  60 mg/m2 (Treatment Plan Recorded) Intravenous Once SLadell Pier MD 271 mL/hr at 09/10/19 1043 126 mg at 09/10/19 1043  . sodium chloride flush (NS) 0.9 % injection 10 mL  10 mL Intracatheter PRN SLadell Pier MD        PHYSICAL EXAMINATION:  Vitals:   09/10/19 0835 09/10/19 0900  BP: 100/68   Pulse: 95 (!) 118  Resp: 18   Temp: (!) 97.5 F (36.4 C)   SpO2: 100% 98%   Filed Weights   09/10/19 0835  Weight: 211 lb (95.7 kg)    GENERAL:alert, no distress and comfortable SKIN: no rash to exposed skin  EYES: sclera clear LUNGS: clear to auscultation with normal breathing effort HEART: regular rate & rhythm, right leg slightly larger than left at baseline  ABDOMEN:abdomen soft, non-tender and normal bowel sounds NEURO: alert & oriented x 3 with fluent speech, normal gait PAC without erythema   LABORATORY DATA:  I have reviewed the data as listed CBC Latest Ref Rng & Units 09/10/2019 09/07/2019 09/04/2019  WBC 4.0 - 10.5 K/uL 2.4(L) 4.0 3.3(L)  Hemoglobin 12.0 - 15.0 g/dL 10.9(L) 11.8(L) 11.3(L)  Hematocrit 36.0 - 46.0 % 32.8(L) 35.5(L) 33.8(L)  Platelets 150 - 400 K/uL 200 242 262     CMP Latest Ref Rng & Units 09/10/2019 09/07/2019 09/04/2019  Glucose 70 - 99 mg/dL 106(H) 102(H) 98  BUN 6 - 20 mg/dL _0 Creatinine 0.44 - 1.00 mg/dL 0.77 0.77 0.79  Sodium 135 - 145 mmol/L 142 138 140  Potassium 3.5 - 5.1 mmol/L 3.6 4.1 3.6  Chloride 98 - 111 mmol/L 106 103 106  CO2 22 - 32 mmol/L _1 Calcium 8.9 - 10.3 mg/dL 8.4(L) 8.4(L) 8.5(L)  Total Protein 6.5 - 8.1 g/dL 6.3(L) 6.4(L) 6.7  Total Bilirubin 0.3 - 1.2 mg/dL 0.4 0.8 0.3  Alkaline Phos 38 - 126 U/L 66 62 79  AST 15 - 41 U/L 26 42(H) 18  ALT 0 - 44 U/L 38 40 17      RADIOGRAPHIC  STUDIES: I have personally reviewed the radiological images as listed and agreed with the findings in the report. No results found.   ASSESSMENT & PLAN:   1. Gastric cancer ? Presenting with dysphagia spring 2019 ? 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 CTs revealed wall thickening and luminal narrowing of the colon at the hepatic flexure and cecum ? PET scan with no evidence of distant metastatic disease or abnormal uptake other than thickening of the distal esophagus and proximal stomach ? Colonoscopy 10/05/2017 at [facility] revealed multiple foci of thickening/masses at the cecum, hepatic flexure, and distal rectum, biopsy from the cecum and hepatic flexure revealed metastatic adenocarcinoma of gastric origin, 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, PD-L1 combined positive score 2 on hepatic flexure biopsy ? CTs 10/23/2017-diffuse prominence of the gastric wall, especially the antrum, focal wall thickening of the distal ascending colon and hepatic flexure, thickening of the cecum, nonspecific haziness of the mesenteric fat in the pelvis, mild prominence of lymph nodes at the greater curvature of the stomach ? Treatment with FOLFOX beginning July 2019 ? CTs October 2019-stable disease, FOLFOX continued ? CTs December 2019-stable disease ? January 2020 treatment transition to Xeloda maintenance, care transition to [place] ? CTs in June 2020 in September 2020-stable disease, Xeloda continued ? CTs 06/23/2019-nonspecific thickening of the GE junction, intraluminal bladder mass ? Cystoscopy-metastatic signet ring cell  adenocarcinoma ? Cycle 1 day 1 ramucirumab/paclitaxel 08/19/2019 (given at another facility) ? Cycle 1 day 1 ramucirumab/paclitaxel 09/04/2019 ? 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 Dyspnea on exertion, ongoing for about a month, worsened after taxol/ramucirumab on 09/04/19 requiring ED visit on 5/23. CBC, CMP, troponin, BNP and chest xray negative   Disposition:  [name] appears stable. She completed C1D1 taxol and ramucirumab. She tolerated well except increased exertional dyspnea following chemo. She went to ED for work up which was negative. She continues Eliquis, no signs of thrombosis, acute respiratory change, or worsening abdominal distention on exam. Today's labs are stable, ANC 1.6, Hgb 10.9; O2 sat remained 98% on RA during ambulation. We discussed ways to improve her physical condition and stamina.   She will proceed with taxol today with mild dose reduction for neutropenia. She will return for f/u and day 15 taxol/ramucirumab in one week. She will see nutrition and genetics today after treatment. She knows to call if her dyspnea or cough worsen or she develops new concerns. The patient was seen with Dr. Benay Spice today.     Orders Placed This Encounter  Procedures  . CBC with Differential (Cancer Center Only)    Standing Status:   Future    Standing Expiration Date:   09/09/2020  . CMP ([facility] only)    Standing Status:   Future    Standing Expiration Date:    09/09/2020   All questions were answered. The patient knows to call the clinic with any problems, questions or concerns. No barriers to learning was detected.     Alla Feeling, NP 09/10/19  This was a shared visit with Cira Rue. [name] was interviewed and examined. The etiology of her exertional dyspnea is unclear. We have  a low clinical suspicion for a primary cardiopulmonary process. The plan is to continue Taxol/ramucirumab. The neutrophil count is higher today.  She will return for an office visit and chemotherapy in 1 week.  Julieanne Manson, MD

## 2019-09-10 ENCOUNTER — Inpatient Hospital Stay (HOSPITAL_BASED_OUTPATIENT_CLINIC_OR_DEPARTMENT_OTHER): Payer: Medicare Other | Admitting: Nurse Practitioner

## 2019-09-10 ENCOUNTER — Inpatient Hospital Stay: Payer: Medicare Other

## 2019-09-10 ENCOUNTER — Inpatient Hospital Stay: Payer: Medicare Other | Admitting: Genetic Counselor

## 2019-09-10 ENCOUNTER — Inpatient Hospital Stay: Payer: Medicare Other | Admitting: Nutrition

## 2019-09-10 ENCOUNTER — Encounter: Payer: Self-pay | Admitting: Nurse Practitioner

## 2019-09-10 ENCOUNTER — Other Ambulatory Visit: Payer: Self-pay

## 2019-09-10 ENCOUNTER — Other Ambulatory Visit: Payer: Self-pay | Admitting: Genetic Counselor

## 2019-09-10 VITALS — BP 100/68 | HR 118 | Temp 97.5°F | Resp 18 | Ht 64.0 in | Wt 211.0 lb

## 2019-09-10 VITALS — HR 86

## 2019-09-10 DIAGNOSIS — Z5112 Encounter for antineoplastic immunotherapy: Secondary | ICD-10-CM | POA: Diagnosis not present

## 2019-09-10 DIAGNOSIS — C169 Malignant neoplasm of stomach, unspecified: Secondary | ICD-10-CM | POA: Diagnosis not present

## 2019-09-10 DIAGNOSIS — Z95828 Presence of other vascular implants and grafts: Secondary | ICD-10-CM

## 2019-09-10 LAB — CMP (CANCER CENTER ONLY)
ALT: 38 U/L (ref 0–44)
AST: 26 U/L (ref 15–41)
Albumin: 3.2 g/dL — ABNORMAL LOW (ref 3.5–5.0)
Alkaline Phosphatase: 66 U/L (ref 38–126)
Anion gap: 10 (ref 5–15)
BUN: 12 mg/dL (ref 6–20)
CO2: 26 mmol/L (ref 22–32)
Calcium: 8.4 mg/dL — ABNORMAL LOW (ref 8.9–10.3)
Chloride: 106 mmol/L (ref 98–111)
Creatinine: 0.77 mg/dL (ref 0.44–1.00)
GFR, Est AFR Am: >60 mL/min (ref >60)
GFR, Estimated: >60 mL/min (ref >60)
Glucose, Bld: 106 mg/dL — ABNORMAL HIGH (ref 70–99)
Potassium: 3.6 mmol/L (ref 3.5–5.1)
Sodium: 142 mmol/L (ref 135–145)
Total Bilirubin: 0.4 mg/dL (ref 0.3–1.2)
Total Protein: 6.3 g/dL — ABNORMAL LOW (ref 6.5–8.1)

## 2019-09-10 LAB — CBC WITH DIFFERENTIAL (CANCER CENTER ONLY)
Abs Immature Granulocytes: 0.01 10*3/uL (ref 0.00–0.07)
Basophils Absolute: 0 10*3/uL (ref 0.0–0.1)
Basophils Relative: 0 %
Eosinophils Absolute: 0 10*3/uL (ref 0.0–0.5)
Eosinophils Relative: 1 %
HCT: 32.8 % — ABNORMAL LOW (ref 36.0–46.0)
Hemoglobin: 10.9 g/dL — ABNORMAL LOW (ref 12.0–15.0)
Immature Granulocytes: 0 %
Lymphocytes Relative: 33 %
Lymphs Abs: 0.8 10*3/uL (ref 0.7–4.0)
MCH: 32.9 pg (ref 26.0–34.0)
MCHC: 33.2 g/dL (ref 30.0–36.0)
MCV: 99.1 fL (ref 80.0–100.0)
Monocytes Absolute: 0.1 10*3/uL (ref 0.1–1.0)
Monocytes Relative: 4 %
Neutro Abs: 1.5 10*3/uL — ABNORMAL LOW (ref 1.7–7.7)
Neutrophils Relative %: 62 %
Platelet Count: 200 10*3/uL (ref 150–400)
RBC: 3.31 MIL/uL — ABNORMAL LOW (ref 3.87–5.11)
RDW: 13.4 % (ref 11.5–15.5)
WBC Count: 2.4 10*3/uL — ABNORMAL LOW (ref 4.0–10.5)
nRBC: 0 % (ref 0.0–0.2)

## 2019-09-10 LAB — GENETIC SCREENING ORDER

## 2019-09-10 MED ORDER — SODIUM CHLORIDE 0.9 % IV SOLN
Freq: Once | INTRAVENOUS | Status: AC
  Filled 2019-09-10: qty 250

## 2019-09-10 MED ORDER — FAMOTIDINE IN NACL 20-0.9 MG/50ML-% IV SOLN
INTRAVENOUS | Status: AC
  Filled 2019-09-10: qty 50

## 2019-09-10 MED ORDER — HEPARIN SOD (PORK) LOCK FLUSH 100 UNIT/ML IV SOLN
500.0000 [IU] | Freq: Once | INTRAVENOUS | Status: AC | PRN
  Administered 2019-09-10: 500 [IU]
  Filled 2019-09-10: qty 5

## 2019-09-10 MED ORDER — SODIUM CHLORIDE 0.9 % IV SOLN
10.0000 mg | Freq: Once | INTRAVENOUS | Status: AC
  Administered 2019-09-10: 10 mg via INTRAVENOUS
  Filled 2019-09-10: qty 10

## 2019-09-10 MED ORDER — SODIUM CHLORIDE 0.9% FLUSH
10.0000 mL | INTRAVENOUS | Status: DC | PRN
  Administered 2019-09-10: 10 mL via INTRAVENOUS
  Filled 2019-09-10: qty 10

## 2019-09-10 MED ORDER — DIPHENHYDRAMINE HCL 50 MG/ML IJ SOLN
INTRAMUSCULAR | Status: AC
  Filled 2019-09-10: qty 1

## 2019-09-10 MED ORDER — SODIUM CHLORIDE 0.9 % IV SOLN
60.0000 mg/m2 | Freq: Once | INTRAVENOUS | Status: AC
  Administered 2019-09-10: 126 mg via INTRAVENOUS
  Filled 2019-09-10: qty 21

## 2019-09-10 MED ORDER — SODIUM CHLORIDE 0.9% FLUSH
10.0000 mL | INTRAVENOUS | Status: DC | PRN
  Administered 2019-09-10: 10 mL
  Filled 2019-09-10: qty 10

## 2019-09-10 MED ORDER — FAMOTIDINE IN NACL 20-0.9 MG/50ML-% IV SOLN
20.0000 mg | Freq: Once | INTRAVENOUS | Status: AC
  Administered 2019-09-10: 20 mg via INTRAVENOUS

## 2019-09-10 MED ORDER — DIPHENHYDRAMINE HCL 50 MG/ML IJ SOLN
25.0000 mg | Freq: Once | INTRAMUSCULAR | Status: AC
  Administered 2019-09-10: 25 mg via INTRAVENOUS

## 2019-09-10 NOTE — Progress Notes (Signed)
Per Cira Rue NP walked patient on doctor side to see if her O2 would drop. Oxygen never dropped past 98%. Pulse went up to 118 while walking and decreased back down to 99 when patient sat down and relaxed.

## 2019-09-10 NOTE — Patient Instructions (Signed)
Michelle Cain for Patients Receiving Chemotherapy  Today you received the following chemotherapy agents: Taxol  To help prevent nausea and vomiting after your treatment, we encourage you to take your nausea medication as prescribed.   If you develop nausea and vomiting that is not controlled by your nausea medication, call the clinic.   BELOW ARE SYMPTOMS THAT SHOULD BE REPORTED IMMEDIATELY:  *FEVER GREATER THAN 100.5 F  *CHILLS WITH OR WITHOUT FEVER  NAUSEA AND VOMITING THAT IS NOT CONTROLLED WITH YOUR NAUSEA MEDICATION  *UNUSUAL SHORTNESS OF BREATH  *UNUSUAL BRUISING OR BLEEDING  TENDERNESS IN MOUTH AND THROAT WITH OR WITHOUT PRESENCE OF ULCERS  *URINARY PROBLEMS  *BOWEL PROBLEMS  UNUSUAL RASH Items with * indicate a potential emergency and should be followed up as soon as possible.  Feel free to call the clinic should you have any questions or concerns. The clinic phone number is [phone].  Please show the [place] at check-in to the Emergency Department and triage nurse.    Paclitaxel injection What is this medicine? PACLITAXEL (PAK li TAX el) is a chemotherapy drug. It targets fast dividing cells, like cancer cells, and causes these cells to die. This medicine is used to treat ovarian cancer, breast cancer, lung cancer, Kaposi's sarcoma, and other cancers. This medicine may be used for other purposes; ask your health care provider or pharmacist if you have questions. COMMON BRAND NAME(S): Onxol, Taxol What should I tell my health care provider before I take this medicine? They need to know if you have any of these conditions:  history of irregular heartbeat  liver disease  low blood counts, like low white cell, platelet, or red cell counts  lung or breathing disease, like asthma  tingling of the fingers or toes, or other nerve disorder  an unusual or allergic reaction to paclitaxel, alcohol, polyoxyethylated  castor oil, other chemotherapy, other medicines, foods, dyes, or preservatives  pregnant or trying to get pregnant  breast-feeding How should I use this medicine? This drug is given as an infusion into a vein. It is administered in a hospital or clinic by a specially trained health care professional. Talk to your pediatrician regarding the use of this medicine in children. Special care may be needed. Overdosage: If you think you have taken too much of this medicine contact a poison control center or emergency room at once. NOTE: This medicine is only for you. Do not share this medicine with others. What if I miss a dose? It is important not to miss your dose. Call your doctor or health care professional if you are unable to keep an appointment. What may interact with this medicine? Do not take this medicine with any of the following medications:  disulfiram  metronidazole This medicine may also interact with the following medications:  antiviral medicines for hepatitis, HIV or AIDS  certain antibiotics like erythromycin and clarithromycin  certain medicines for fungal infections like ketoconazole and itraconazole  certain medicines for seizures like carbamazepine, phenobarbital, phenytoin  gemfibrozil  nefazodone  rifampin  St. John's wort This list may not describe all possible interactions. Give your health care provider a list of all the medicines, herbs, non-prescription drugs, or dietary supplements you use. Also tell them if you smoke, drink alcohol, or use illegal drugs. Some items may interact with your medicine. What should I watch for while using this medicine? Your condition will be monitored carefully while you are receiving this medicine. You will need important  blood work done while you are taking this medicine. This medicine can cause serious allergic reactions. To reduce your risk you will need to take other medicine(s) before treatment with this medicine. If you  experience allergic reactions like skin rash, itching or hives, swelling of the face, lips, or tongue, tell your doctor or health care professional right away. In some cases, you may be given additional medicines to help with side effects. Follow all directions for their use. This drug may make you feel generally unwell. This is not uncommon, as chemotherapy can affect healthy cells as well as cancer cells. Report any side effects. Continue your course of treatment even though you feel ill unless your doctor tells you to stop. Call your doctor or health care professional for advice if you get a fever, chills or sore throat, or other symptoms of a cold or flu. Do not treat yourself. This drug decreases your body's ability to fight infections. Try to avoid being around people who are sick. This medicine may increase your risk to bruise or bleed. Call your doctor or health care professional if you notice any unusual bleeding. Be careful brushing and flossing your teeth or using a toothpick because you may get an infection or bleed more easily. If you have any dental work done, tell your dentist you are receiving this medicine. Avoid taking products that contain aspirin, acetaminophen, ibuprofen, naproxen, or ketoprofen unless instructed by your doctor. These medicines may hide a fever. Do not become pregnant while taking this medicine. Women should inform their doctor if they wish to become pregnant or think they might be pregnant. There is a potential for serious side effects to an unborn child. Talk to your health care professional or pharmacist for more information. Do not breast-feed an infant while taking this medicine. Men are advised not to father a child while receiving this medicine. This product may contain alcohol. Ask your pharmacist or healthcare provider if this medicine contains alcohol. Be sure to tell all healthcare providers you are taking this medicine. Certain medicines, like metronidazole  and disulfiram, can cause an unpleasant reaction when taken with alcohol. The reaction includes flushing, headache, nausea, vomiting, sweating, and increased thirst. The reaction can last from 30 minutes to several hours. What side effects may I notice from receiving this medicine? Side effects that you should report to your doctor or health care professional as soon as possible:  allergic reactions like skin rash, itching or hives, swelling of the face, lips, or tongue  breathing problems  changes in vision  fast, irregular heartbeat  high or low blood pressure  mouth sores  pain, tingling, numbness in the hands or feet  signs of decreased platelets or bleeding - bruising, pinpoint red spots on the skin, black, tarry stools, blood in the urine  signs of decreased red blood cells - unusually weak or tired, feeling faint or lightheaded, falls  signs of infection - fever or chills, cough, sore throat, pain or difficulty passing urine  signs and symptoms of liver injury like dark yellow or brown urine; general ill feeling or flu-like symptoms; light-colored stools; loss of appetite; nausea; right upper belly pain; unusually weak or tired; yellowing of the eyes or skin  swelling of the ankles, feet, hands  unusually slow heartbeat Side effects that usually do not require medical attention (report to your doctor or health care professional if they continue or are bothersome):  diarrhea  hair loss  loss of appetite  muscle or joint  pain  nausea, vomiting  pain, redness, or irritation at site where injected  tiredness This list may not describe all possible side effects. Call your doctor for medical advice about side effects. You may report side effects to FDA at 1-800-FDA-1088. Where should I keep my medicine? This drug is given in a hospital or clinic and will not be stored at home. NOTE: This sheet is a summary. It may not cover all possible information. If you have  questions about this medicine, talk to your doctor, pharmacist, or health care provider.  2020 Elsevier/Gold Standard (2016-12-05 13:14:55)   Ramucirumab injection What is this medicine? RAMUCIRUMAB (ra mue SIR ue mab) is a monoclonal antibody. It is used to treat stomach cancer, colorectal cancer, liver cancer, and lung cancer. This medicine may be used for other purposes; ask your health care provider or pharmacist if you have questions. COMMON BRAND NAME(S): Cyramza What should I tell my health care provider before I take this medicine? They need to know if you have any of these conditions:  bleeding disorders  blood clots  heart disease, including heart failure, heart attack, or chest pain (angina)  high blood pressure  infection (especially a virus infection such as chickenpox, cold sores, or herpes)  protein in your urine  recent or planning to have surgery  stroke  an unusual or allergic reaction to ramucirumab, other medicines, foods, dyes, or preservatives  pregnant or trying to get pregnant  breast-feeding How should I use this medicine? This medicine is for infusion into a vein. It is given by a health care professional in a hospital or clinic setting. Talk to your pediatrician regarding the use of this medicine in children. Special care may be needed. Overdosage: If you think you have taken too much of this medicine contact a poison control center or emergency room at once. NOTE: This medicine is only for you. Do not share this medicine with others. What if I miss a dose? It is important not to miss your dose. Call your doctor or health care professional if you are unable to keep an appointment. What may interact with this medicine? Interactions have not been studied. This list may not describe all possible interactions. Give your health care provider a list of all the medicines, herbs, non-prescription drugs, or dietary supplements you use. Also tell them if  you smoke, drink alcohol, or use illegal drugs. Some items may interact with your medicine. What should I watch for while using this medicine? Your condition will be monitored carefully while you are receiving this medicine. You will need to to check your blood pressure and have your blood and urine tested while you are taking this medicine. Your condition will be monitored carefully while you are receiving this medicine. This medicine may increase your risk to bruise or bleed. Call your doctor or health care professional if you notice any unusual bleeding. Before having surgery, talk to your health care provider to make sure it is ok. This drug can increase the risk of poor healing of your surgical site or wound. You will need to stop this drug for 28 days before surgery. After surgery, wait at least 2 weeks before restarting this drug. Make sure the surgical site or wound is healed enough before restarting this drug. Talk to your health care provider if questions. Do not become pregnant while taking this medicine or for 3 months after stopping it. Women should inform their doctor if they wish to become pregnant or think  they might be pregnant. There is a potential for serious side effects to an unborn child. Talk to your health care professional or pharmacist for more information. Do not breast-feed an infant while taking this medicine or for 2 months after stopping it. This medicine may interfere with the ability to have a child. Talk with your doctor or health care professional if you are concerned about your fertility. What side effects may I notice from receiving this medicine? Side effects that you should report to your doctor or health care professional as soon as possible:  allergic reactions like skin rash, itching or hives, breathing problems, swelling of the face, lips, or tongue  signs of infection - fever or chills, cough, sore throat  chest pain or chest  tightness  confusion  dizziness  feeling faint or lightheaded, falls  severe abdominal pain  severe nausea, vomiting  signs and symptoms of bleeding such as bloody or black, tarry stools; red or dark-brown urine; spitting up blood or brown material that looks like coffee grounds; red spots on the skin; unusual bruising or bleeding from the eye, gums, or nose  signs and symptoms of a blood clot such as breathing problems; changes in vision; chest pain; severe, sudden headache; pain, swelling, warmth in the leg; trouble speaking; sudden numbness or weakness of the face, arm or leg  symptoms of a stroke: change in mental awareness, inability to talk or move one side of the body  trouble walking, dizziness, loss of balance or coordination Side effects that usually do not require medical attention (report to your doctor or health care professional if they continue or are bothersome):  cold, clammy skin  constipation  diarrhea  headache  nausea, vomiting  stomach pain  unusually slow heartbeat  unusually weak or tired This list may not describe all possible side effects. Call your doctor for medical advice about side effects. You may report side effects to FDA at 1-800-FDA-1088. Where should I keep my medicine? This drug is given in a hospital or clinic and will not be stored at home. NOTE: This sheet is a summary. It may not cover all possible information. If you have questions about this medicine, talk to your doctor, pharmacist, or health care provider.  2020 Elsevier/Gold Standard (2019-01-29 11:17:50)

## 2019-09-10 NOTE — Progress Notes (Signed)
Nutrition Assessment   Reason for Assessment: Consult   ASSESSMENT: 54 year old female with gastroesophageal cancer (diagnosed 2019) followed by Dr. 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 Patient reports recent history of severe nausea, which has now resolved. She is taking anti-emetics, reports that they work better when she takes them after meals. Patient reports usual intake of 3 meals daily, denies intake of nutrition supplement and reports occasionally drinking Naked Fruit. Patient reports chronic constipation on bowel regimen.   Patient endorses weight loss, recalls usual body weight of 260 lbs. Limited past weight history for review, on 5/11 pt weighed 216 lb 3.2 oz, on 5/20 pt weighed 212 lb 11.2 oz. Last documented weight 210 lb in 07/2012. Patient weights have decreased 5 lb (2.3%) in 2 weeks.   Nutrition Focused Physical Exam: deferred   Medications: Cholecalciferol, Colace, Lasix, Gabapentin, Reglan, Zofran, Compazine, Meloxicam, Klor-Clon   Labs: 5/26 BG 106, Corrected Ca 9.04, Albumin 3.2   Anthropometrics:   Height: 5'4" Weight: 211 lb (95.7 kg) UBW: 260 lbs per pt  BMI: 36.22 kg/m2   Estimated Energy Needs  Kcals: 2300-2500 Protein: 115-125 Fluid: 2.5 L   NUTRITION DIAGNOSIS: Unintended weight loss related to gastric cancer as evidenced by significant 18.8% wt loss from usual body weight.   INTERVENTION:  Provided education on the importance of small frequent meals with high calorie, high  protein foods.  Recommended 2 Ensure Enlive supplement daily and provided complimentary case, coupons, as well as supplement recipe book  Encouraged regular bowel regimen, recommended trying 4 oz prune juice daily, educated on adequate hydration Recommend continued use of nausea medication Provided handouts on constipation, nausea and vomiting Questions were answered. Teach back method used. Contact information provided.  MONITORING, EVALUATION, GOAL: Patient will maintain adequate calories and protein to minimize weight loss   Next Visit: To be scheduled during infusion as needed  Lajuan Lines, RD, LDN Clinical Nutrition After Hours/Weekend Pager # in [place]

## 2019-09-10 NOTE — Progress Notes (Signed)
gene °

## 2019-09-11 ENCOUNTER — Telehealth: Payer: Self-pay | Admitting: Nurse Practitioner

## 2019-09-11 NOTE — Telephone Encounter (Signed)
Scheduled appt per 5/26 los.  Spoke with pt and she is aware of the appt date and time.

## 2019-09-14 ENCOUNTER — Other Ambulatory Visit: Payer: Self-pay | Admitting: Oncology

## 2019-09-16 MED FILL — Dexamethasone Sodium Phosphate Inj 100 MG/10ML: INTRAMUSCULAR | Qty: 1 | Status: AC

## 2019-09-16 NOTE — Progress Notes (Signed)
Spoke w/ Dr. Benay Spice - follow up results of urinalysis with next ramucirumab dose. Urine protein was elevated in setting of UTI last cycle. Will assess results to see if urine protein is still elevated if infection has cleared.   Demetrius Charity, PharmD, BCPS, [facility] Pharmacist Pharmacy Phone: [phone] 09/16/2019

## 2019-09-17 ENCOUNTER — Other Ambulatory Visit: Payer: Self-pay

## 2019-09-17 ENCOUNTER — Other Ambulatory Visit: Payer: Self-pay | Admitting: *Deleted

## 2019-09-17 ENCOUNTER — Inpatient Hospital Stay: Payer: Medicare Other

## 2019-09-17 ENCOUNTER — Inpatient Hospital Stay: Payer: Medicare Other | Attending: Oncology | Admitting: Oncology

## 2019-09-17 VITALS — BP 101/65 | HR 89 | Temp 97.3°F | Resp 17 | Ht 64.0 in | Wt 211.4 lb

## 2019-09-17 DIAGNOSIS — Z5112 Encounter for antineoplastic immunotherapy: Secondary | ICD-10-CM | POA: Diagnosis present

## 2019-09-17 DIAGNOSIS — Z5111 Encounter for antineoplastic chemotherapy: Secondary | ICD-10-CM | POA: Insufficient documentation

## 2019-09-17 DIAGNOSIS — Z452 Encounter for adjustment and management of vascular access device: Secondary | ICD-10-CM | POA: Insufficient documentation

## 2019-09-17 DIAGNOSIS — C169 Malignant neoplasm of stomach, unspecified: Secondary | ICD-10-CM | POA: Insufficient documentation

## 2019-09-17 DIAGNOSIS — Z853 Personal history of malignant neoplasm of breast: Secondary | ICD-10-CM | POA: Diagnosis not present

## 2019-09-17 DIAGNOSIS — R808 Other proteinuria: Secondary | ICD-10-CM

## 2019-09-17 DIAGNOSIS — I10 Essential (primary) hypertension: Secondary | ICD-10-CM | POA: Insufficient documentation

## 2019-09-17 DIAGNOSIS — G629 Polyneuropathy, unspecified: Secondary | ICD-10-CM | POA: Insufficient documentation

## 2019-09-17 DIAGNOSIS — K1231 Oral mucositis (ulcerative) due to antineoplastic therapy: Secondary | ICD-10-CM | POA: Insufficient documentation

## 2019-09-17 DIAGNOSIS — Z95828 Presence of other vascular implants and grafts: Secondary | ICD-10-CM

## 2019-09-17 LAB — CBC WITH DIFFERENTIAL (CANCER CENTER ONLY)
Abs Immature Granulocytes: 0.03 10*3/uL (ref 0.00–0.07)
Basophils Absolute: 0 10*3/uL (ref 0.0–0.1)
Basophils Relative: 0 %
Eosinophils Absolute: 0 10*3/uL (ref 0.0–0.5)
Eosinophils Relative: 1 %
HCT: 29.5 % — ABNORMAL LOW (ref 36.0–46.0)
Hemoglobin: 10 g/dL — ABNORMAL LOW (ref 12.0–15.0)
Immature Granulocytes: 2 %
Lymphocytes Relative: 58 %
Lymphs Abs: 1 10*3/uL (ref 0.7–4.0)
MCH: 33.3 pg (ref 26.0–34.0)
MCHC: 33.9 g/dL (ref 30.0–36.0)
MCV: 98.3 fL (ref 80.0–100.0)
Monocytes Absolute: 0.3 10*3/uL (ref 0.1–1.0)
Monocytes Relative: 15 %
Neutro Abs: 0.4 10*3/uL — CL (ref 1.7–7.7)
Neutrophils Relative %: 24 %
Platelet Count: 257 10*3/uL (ref 150–400)
RBC: 3 MIL/uL — ABNORMAL LOW (ref 3.87–5.11)
RDW: 13.5 % (ref 11.5–15.5)
WBC Count: 1.7 10*3/uL — ABNORMAL LOW (ref 4.0–10.5)
nRBC: 1.2 % — ABNORMAL HIGH (ref 0.0–0.2)

## 2019-09-17 LAB — CMP (CANCER CENTER ONLY)
ALT: 46 U/L — ABNORMAL HIGH (ref 0–44)
AST: 37 U/L (ref 15–41)
Albumin: 3.1 g/dL — ABNORMAL LOW (ref 3.5–5.0)
Alkaline Phosphatase: 63 U/L (ref 38–126)
Anion gap: 8 (ref 5–15)
BUN: 9 mg/dL (ref 6–20)
CO2: 24 mmol/L (ref 22–32)
Calcium: 8.3 mg/dL — ABNORMAL LOW (ref 8.9–10.3)
Chloride: 105 mmol/L (ref 98–111)
Creatinine: 0.76 mg/dL (ref 0.44–1.00)
GFR, Est AFR Am: >60 mL/min (ref >60)
GFR, Estimated: >60 mL/min (ref >60)
Glucose, Bld: 92 mg/dL (ref 70–99)
Potassium: 4.4 mmol/L (ref 3.5–5.1)
Sodium: 137 mmol/L (ref 135–145)
Total Bilirubin: 0.3 mg/dL (ref 0.3–1.2)
Total Protein: 6.3 g/dL — ABNORMAL LOW (ref 6.5–8.1)

## 2019-09-17 LAB — TOTAL PROTEIN, URINE DIPSTICK: Protein, ur: 100 mg/dL — AB

## 2019-09-17 MED ORDER — HEPARIN SOD (PORK) LOCK FLUSH 100 UNIT/ML IV SOLN
500.0000 [IU] | Freq: Once | INTRAVENOUS | Status: AC
  Administered 2019-09-17: 500 [IU] via INTRAVENOUS
  Filled 2019-09-17: qty 5

## 2019-09-17 MED ORDER — MAGIC MOUTHWASH: 5.0000 mL | Freq: Four times a day (QID) | ORAL | 1 refills | Status: DC | PRN

## 2019-09-17 MED ORDER — SODIUM CHLORIDE 0.9% FLUSH
10.0000 mL | INTRAVENOUS | Status: DC | PRN
  Administered 2019-09-17: 10 mL via INTRAVENOUS
  Filled 2019-09-17: qty 10

## 2019-09-17 MED ORDER — SODIUM CHLORIDE 0.9% FLUSH
10.0000 mL | INTRAVENOUS | Status: AC | PRN
  Administered 2019-09-17: 10 mL via INTRAVENOUS
  Filled 2019-09-17: qty 10

## 2019-09-17 NOTE — Addendum Note (Signed)
Addended by: Tania Ade on: 09/17/2019 11:15 AM   Modules accepted: Orders, SmartSet

## 2019-09-17 NOTE — Progress Notes (Signed)
[facility] OFFICE PROGRESS NOTE   Diagnosis: Gastric cancer  INTERVAL HISTORY:   [name] completed another treatment with Taxol on 09/10/2019.  No change in peripheral neuropathy symptoms that predated Taxol.  She reports improvement in nausea.  No vomiting for the past 2 weeks.  She has mouth sores that do not interfere with eating.  She continues to have an intermittent cough.  She has mild dyspnea when she first starts to ambulate.  She continues to have an intermittent cough, not improved with Robitussin.  Objective:  Vital signs in last 24 hours:  Blood pressure 101/65, pulse 89, temperature (!) 97.3 F (36.3 C), temperature source Temporal, resp. rate 17, height _0  (1.626 m), weight 211 lb 6.4 oz (95.9 kg), SpO2 100 %.    HEENT: Small ulcerations at the right side of the tongue, erythema and ulceration at the posterior bilateral buccal mucosa, no thrush Resp: Lungs clear bilaterally, no respiratory distress Cardio: Regular rate and rhythm GI: No hepatomegaly, nontender Vascular: No leg edema    Portacath/PICC-without erythema  Lab Results:  Lab Results  Component Value Date   WBC 1.7 (L) 09/17/2019   HGB 10.0 (L) 09/17/2019   HCT 29.5 (L) 09/17/2019   MCV 98.3 09/17/2019   PLT 257 09/17/2019   NEUTROABS PENDING 09/17/2019    CMP  Lab Results  Component Value Date   NA 142 09/10/2019   K 3.6 09/10/2019   CL 106 09/10/2019   CO2 26 09/10/2019   GLUCOSE 106 (H) 09/10/2019   BUN 12 09/10/2019   CREATININE 0.77 09/10/2019   CALCIUM 8.4 (L) 09/10/2019   PROT 6.3 (L) 09/10/2019   ALBUMIN 3.2 (L) 09/10/2019   AST 26 09/10/2019   ALT 38 09/10/2019   ALKPHOS 66 09/10/2019   BILITOT 0.4 09/10/2019   GFRNONAA >60 09/10/2019   GFRAA >60 09/10/2019    Lab Results  Component Value Date   CEA1 3.45 09/04/2019    Medications: I have reviewed the patient's current medications.   Assessment/Plan: 1. Gastric cancer ? Presenting with  dysphagia spring 2019 ? 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 CTs revealed wall thickening and luminal narrowing of the colon at the hepatic flexure and cecum ? PET scan with no evidence of distant metastatic disease or abnormal uptake other than thickening of the distal esophagus and proximal stomach ? Colonoscopy 10/05/2017 at [facility] revealed multiple foci of thickening/masses at the cecum, hepatic flexure, and distal rectum, biopsy from the cecum and hepatic flexure revealed metastatic adenocarcinoma of gastric origin, 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, PD-L1 combined positive score 2 on hepatic flexure biopsy ? CTs 10/23/2017-diffuse prominence of the gastric wall, especially the antrum, focal wall thickening of the distal ascending colon and hepatic flexure, thickening of the cecum, nonspecific haziness of the mesenteric fat in the pelvis, mild prominence of lymph nodes at the greater curvature of the stomach ? Treatment with FOLFOX beginning July 2019 ? CTs October 2019-stable disease, FOLFOX continued ? CTs December 2019-stable disease ? January 2020 treatment transition to Xeloda maintenance, care transition to [facility] ? CTs in June 2020 in September 2020-stable disease, Xeloda continued ? CTs 06/23/2019-nonspecific thickening of the GE junction, intraluminal bladder  mass ? Cystoscopy-metastatic signet ring cell adenocarcinoma ? Cycle 1 day 1 ramucirumab/paclitaxel 08/19/2019 (given at another facility) ? Cycle 1 day 1 ramucirumab/paclitaxel 09/04/2019 ? Cycle 1 day 8 Taxol 09/10/19 ? Cycle 1 day 15  ramucirumab/paclitaxel 09/17/2019  2.Left breast cancer 2008,pT1c,pN0, status post a left lumpectomy with adjuvant chemotherapy and radiation, ER positive, PR positive, HER-2 positive  3.Mixed connective tissue disease/SLE  4.Lower extremity deep vein thrombosis maintained on apixaban  5.Family history of multiple cancers including breast and ovarian cancer  6.Dysphagia and intermittent vomiting secondary to #1  7.Hypertension  8.Peripheral neuropathy  9.Masslike fullness at the posterior right parotid/angle of the jaw  10. Dyspnea on exertion, ongoing for about a month, worsened after taxol/ramucirumab on 09/04/19 requiring ED visit on 5/23. CBC, CMP, troponin, BNP and chest xray negative   11.  Mucositis secondary to chemotherapy 09/17/2019    Disposition: [name] appears stable.  Her nausea has improved since beginning the Taxol/ramucirumab.  We will follow up on the neutrophil count from today and decide on whether to administer Taxol today.  She has mucositis secondary to chemotherapy.  We will prescribe Magic mouthwash.  The etiology of her cough is unclear.  I have a low suspicion for a primary cardiopulmonary process.  The cough may be related to reflux.  She coughed only 1 time while in the exam room today.  She knows to call for a fever, increased cough, or other symptoms of an infection.  She will return as scheduled on 10/01/2019.  Betsy Coder, MD  09/17/2019  10:27 AM Addendum: The absolute neutrophil count returned at 0.4.  Treatment will be held today.  She will be rescheduled for treatment in 1 week.  She will call for a fever or symptoms of an infection.

## 2019-09-17 NOTE — Patient Instructions (Signed)
Neutropenia Neutropenia is a condition that occurs when you have a lower-than-normal level of a type of white blood cell (neutrophil) in your body. Neutrophils are made in the spongy center of large bones (bone marrow), and they fight infections. Neutrophils are your body's main defense against bacterial and fungal infections. The fewer neutrophils you have and the longer your body remains without them, the greater your risk of getting a severe infection. What are the causes? This condition can occur if your body uses up or destroys neutrophils faster than your bone marrow can make them. Neutropenia may be caused by:  A bacterial or fungal infection.  Allergic disorders.  Reactions to some medicines.  An autoimmune disease.  An enlarged spleen. This condition can also occur if your bone marrow does not produce enough neutrophils. This problem may be caused by:  Cancer.  Cancer treatments, such as radiation or chemotherapy.  Viral infections.  Medicines, such as phenytoin.  Vitamin B12 deficiency.  Diseases of the bone marrow.  Environmental toxins, such as insecticides. What are the signs or symptoms? This condition does not usually cause symptoms. If symptoms are present, they are usually caused by an underlying infection. Symptoms of an infection may include:  Fever.  Chills.  Swollen glands.  Oral or anal ulcers.  Cough and shortness of breath.  Rash.  Skin infection.  Fatigue. How is this diagnosed? Your health care provider may suspect neutropenia if you have:  A condition that may cause neutropenia.  Symptoms during or after treatment for cancer.  Symptoms of infection, especially fever.  Frequent and unusual infections. This condition is diagnosed based on your medical history and a physical exam. Tests will also be done, such as:  A complete blood count (CBC).  A procedure to collect a sample of bone marrow for examination (bone marrow biopsy).   A chest X-ray.  A urine culture.  A blood culture. How is this treated? Treatment depends on the underlying cause and severity of your condition. Mild neutropenia may not require treatment. Treatment may include medicines, such as:  Antibiotic medicine given through an IV.  Antiviral medicines.  Antifungal medicines.  A medicine to increase neutrophil production (colony-stimulating factor). You may get this drug through an IV or by injection.  Steroids given through an IV. If an underlying condition is causing neutropenia, you may need treatment for that condition. If medicines or cancer treatments are causing neutropenia, your health care provider may have you stop the medicines or treatment. Follow these instructions at home: Medicines   Take over-the-counter and prescription medicines only as told by your health care provider.  Get a seasonal flu shot (influenza vaccine).  Avoid people who received a vaccine in the past 30 days if that vaccine contained a live version of the germ (live vaccine). You should not get a live vaccine. Common live vaccines are polio, MMR, chicken pox, and shingles vaccines. Eating and drinking  Do not share food utensils.  Do not eat unpasteurized foods.  Do not eat raw or undercooked meat, eggs, or seafood.  Do not eat unwashed, raw fruits or vegetables. Lifestyle  Avoid exposure to groups of people or children.  Avoid being around people who are sick.  Avoid being around dirt or dust, such as in construction areas or gardens.  Do not provide direct care for pets. Avoid animal droppings. Do not clean litter boxes and bird cages.  Do not have sex unless your health care provider has approved. Hygiene  Bathe daily.  Clean the area between the genitals and the anus (perineal area) after you urinate or have a bowel movement. If you are female, wipe from front to back.  Brush your teeth with a soft toothbrush before and after meals.   Do not use a regular razor. Use an electric razor to remove hair.  Wash your hands often. Make sure others who come in contact with you also wash their hands. If soap and water are not available, use hand sanitizer. General instructions  Follow any precautions as told by your health care provider to reduce your risk for injury or infection.  Take actions to avoid cuts and burns. For example: ? Be cautious when you use knives. Always cut away from yourself. ? Keep knives in protective sheaths or guards when not in use. ? Use oven mitts when you cook with a hot stove, oven, or grill. ? Stand a safe distance away from open fires.  Do not use tampons, enemas, or rectal suppositories unless your health care provider has approved.  Keep all follow-up visits as told by your health care provider. This is important. Contact a health care provider if:  You have: ? A sore throat. ? A warm, red, or tender area on your skin. ? A cough. ? Frequent or painful urination. ? Vaginal discharge or itching.  You develop: ? Sores in your mouth or anus. ? Swollen lymph nodes. ? Red streaks on the skin. ? A rash. Get help right away if:  You have: ? A fever. ? Chills, or you start to shake.  You feel: ? Nauseous, or you vomit. ? Very fatigued. ? Short of breath. Summary  Neutropenia is a condition that occurs when you have a lower-than-normal level of a type of white blood cell (neutrophil) in your body.  This condition can occur if your body uses up or destroys neutrophils faster than your bone marrow can make them.  Treatment depends on the underlying cause and severity of your condition. Mild neutropenia may not require treatment.  Follow any precautions as told by your health care provider to reduce your risk for injury or infection. This information is not intended to replace advice given to you by your health care provider. Make sure you discuss any questions you have with your health  care provider. Document Revised: 01/17/2018 Document Reviewed: 01/17/2018 Elsevier Patient Education  [place].

## 2019-09-19 ENCOUNTER — Encounter: Payer: Self-pay | Admitting: Genetic Counselor

## 2019-09-19 DIAGNOSIS — Z1379 Encounter for other screening for genetic and chromosomal anomalies: Secondary | ICD-10-CM | POA: Insufficient documentation

## 2019-09-19 NOTE — Progress Notes (Signed)
Received records from [facility] with information regarding patient's previous breast cancer treatment.  These were sent to HIM for scanning into patient's chart.

## 2019-09-21 ENCOUNTER — Other Ambulatory Visit: Payer: Self-pay | Admitting: Oncology

## 2019-09-23 NOTE — Progress Notes (Signed)
[place]   [phone] [phone]   Clinic Follow up Note   Patient Care Team: Laurette Schimke, DO as PCP - General (Internal Medicine) Jonnie Finner, RN as Oncology Nurse Navigator Ladell Pier, MD as Consulting Physician (Oncology) 09/24/2019  CHIEF COMPLAINT: F/u gastric cancer   INTERVAL HISTORY: [name] returns for f/u and treatment as scheduled. She was seen on 6/2 by Dr. Benay Spice, taxol was held due to neutropenia. She feels well today. Her energy and activity are gradually improving. Eating and drinking better. Cough is nearly resolved. Dyspnea improved, she walked to exam room today. She still vomits sporadically some days. She is more constipated, no BM in 4 days. Passing gas. Takes colace once daily, not been using miralax lately. Her baseline generalized pain is unchanged. Mild neuropathy stable. Denies fever, chills, or new complaints.    MEDICAL HISTORY:  Past Medical History:  Diagnosis Date  . Cancer ([place])   . Lupus ([place])   . Raynaud disease     SURGICAL HISTORY: Past Surgical History:  Procedure Laterality Date  . BREAST SURGERY    . CHOLECYSTECTOMY      I have reviewed the social history and family history with the patient and they are unchanged from previous note.  ALLERGIES:  has No Known Allergies.  MEDICATIONS:  Current Outpatient Medications  Medication Sig Dispense Refill  . amLODipine (NORVASC) 5 MG tablet Take 5 mg by mouth daily.    Marland Kitchen apixaban (ELIQUIS) 5 MG TABS tablet Take 5 mg by mouth in the morning and at bedtime.    Marland Kitchen aspirin EC 81 MG tablet Take 81 mg by mouth daily.    Marland Kitchen azaTHIOprine (IMURAN) 50 MG tablet Take 50 mg by mouth 2 (two) times daily.    . Cholecalciferol 50 MCG (2000 UT) CAPS Take 2,000 Units by mouth daily.    Marland Kitchen docusate sodium (COLACE) 100 MG capsule Take 100 mg by mouth at bedtime.    . gabapentin (NEURONTIN) 300 MG capsule Take 300 mg by mouth in the morning and at  bedtime.    Marland Kitchen HYDROcodone-acetaminophen (NORCO) 10-325 MG tablet Take 1 tablet by mouth every 6 (six) hours as needed for moderate pain or severe pain.     . hydroxychloroquine (PLAQUENIL) 200 MG tablet Take 200 mg by mouth 2 (two) times daily.    . magic mouthwash SOLN Take 5-10 mLs by mouth 4 (four) times daily as needed for mouth pain. Swish and spit 400 mL 1  . meloxicam (MOBIC) 15 MG tablet Take 1 tablet (15 mg total) by mouth daily. 30 tablet 1  . metoCLOPramide (REGLAN) 10 MG tablet Take 10 mg by mouth 3 (three) times daily before meals.    Marland Kitchen omeprazole (PRILOSEC) 20 MG capsule Take 20 mg by mouth daily.    . ondansetron (ZOFRAN) 8 MG tablet Take 8 mg by mouth every 8 (eight) hours as needed for nausea or vomiting.     . Polyethylene Glycol 3350 (MIRALAX PO) Take 17 g by mouth in the morning and at bedtime.    . potassium chloride (KLOR-CON) 10 MEQ tablet Take 10 mEq by mouth daily.    . prochlorperazine (COMPAZINE) 10 MG tablet Take 10 mg by mouth every 6 (six) hours as needed for nausea or vomiting.     . traMADol (ULTRAM) 50 MG tablet Take 1 tablet (50 mg total) by mouth every 8 (eight) hours as needed for pain. 90 tablet 1  .  furosemide (LASIX) 20 MG tablet Take 20 mg by mouth daily as needed for fluid or edema.     Marland Kitchen loperamide (IMODIUM) 2 MG capsule Take 2 mg by mouth as needed for diarrhea or loose stools.      No current facility-administered medications for this visit.   Facility-Administered Medications Ordered in Other Visits  Medication Dose Route Frequency Provider Last Rate Last Admin  . sodium chloride flush (NS) 0.9 % injection 10 mL  10 mL Intravenous PRN Ladell Pier, MD   10 mL at 09/17/19 1109    PHYSICAL EXAMINATION:  Vitals:   09/24/19 0909  BP: 114/75  Pulse: 93  Resp: 20  Temp: 98.1 F (36.7 C)  SpO2: 100%   Filed Weights   09/24/19 0909  Weight: 212 lb 11.2 oz (96.5 kg)    GENERAL:alert, no distress and comfortable SKIN: no rash to exposed  skin  EYES:  sclera clear OROPHARYNX: no thrush or ulcers LUNGS: clear with normal breathing effort HEART: regular rate & rhythm, no lower extremity edema ABDOMEN: abdomen soft, non-tender and normal bowel sounds NEURO: alert & oriented x 3 with fluent speech, normal gait PAC without erythema   LABORATORY DATA:  I have reviewed the data as listed CBC Latest Ref Rng & Units 09/24/2019 09/17/2019 09/10/2019  WBC 4.0 - 10.5 K/uL 4.7 1.7(L) 2.4(L)  Hemoglobin 12.0 - 15.0 g/dL 10.8(L) 10.0(L) 10.9(L)  Hematocrit 36.0 - 46.0 % 33.1(L) 29.5(L) 32.8(L)  Platelets 150 - 400 K/uL 318 257 200     CMP Latest Ref Rng & Units 09/24/2019 09/17/2019 09/10/2019  Glucose 70 - 99 mg/dL 94 92 106(H)  BUN 6 - 20 mg/dL _0 Creatinine 0.44 - 1.00 mg/dL 0.81 0.76 0.77  Sodium 135 - 145 mmol/L 143 137 142  Potassium 3.5 - 5.1 mmol/L 3.9 4.4 3.6  Chloride 98 - 111 mmol/L 108 105 106  CO2 22 - 32 mmol/L _1 Calcium 8.9 - 10.3 mg/dL 8.6(L) 8.3(L) 8.4(L)  Total Protein 6.5 - 8.1 g/dL 6.8 6.3(L) 6.3(L)  Total Bilirubin 0.3 - 1.2 mg/dL 0.3 0.3 0.4  Alkaline Phos 38 - 126 U/L 74 63 66  AST 15 - 41 U/L 15 37 26  ALT 0 - 44 U/L 13 46(H) 38      RADIOGRAPHIC STUDIES: I have personally reviewed the radiological images as listed and agreed with the findings in the report. No results found.   ASSESSMENT & PLAN:   1. Gastric cancer ? Presenting with dysphagia spring 2019 ? 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 CTs revealed wall thickening and luminal narrowing of the colon at the hepatic flexure and cecum ? PET scan with no evidence of distant metastatic disease or abnormal uptake other than thickening of the  distal esophagus and proximal stomach ? Colonoscopy 10/05/2017 at [facility] revealed multiple foci of thickening/masses at the cecum, hepatic flexure, and distal rectum, biopsy from the cecum and hepatic flexure revealed metastatic adenocarcinoma of gastric origin, 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, PD-L1 combined positive score 2 on hepatic flexure biopsy ? CTs 10/23/2017-diffuse prominence of the gastric wall, especially the antrum, focal wall thickening of the distal ascending colon and hepatic flexure, thickening of the  cecum, nonspecific haziness of the mesenteric fat in the pelvis, mild prominence of lymph nodes at the greater curvature of the stomach ? Treatment with FOLFOX beginning July 2019 ? CTs October 2019-stable disease, FOLFOX continued ? CTs December 2019-stable disease ? January 2020 treatment transition to Xeloda maintenance, care transition to [place] ? CTs in June 2020 in September 2020-stable disease, Xeloda continued ? CTs 06/23/2019-nonspecific thickening of the GE junction, intraluminal bladder mass ? Cystoscopy-metastatic signet ring cell adenocarcinoma ? Cycle 1 day 1 ramucirumab/paclitaxel 08/19/2019(given atanother facility) ? Cycle 1 day 1 ramucirumab/paclitaxel 09/04/2019 ? Cycle 1 day 8 Taxol 09/10/19 ? Cycle 1 day 15 ramucirumab/paclitaxel 09/17/2019 canceled d/t neutropenia ? Cycle 2 day 1 ramucirumab/pactlitaxel 09/24/2019 treatment changed to D1, 15 (q2 weeks)   2.Left breast cancer 2008,pT1c,pN0, status post a left lumpectomy with adjuvant chemotherapy and radiation, ER positive, PR positive, HER-2 positive  3.Mixed connective tissue disease/SLE  4.Lower extremity deep vein thrombosis maintained on apixaban  5.Family history of multiple cancers including breast and ovarian cancer  6.Dysphagia and intermittent vomiting secondary to  #1  7.Hypertension  8.Peripheral neuropathy  9.Masslike fullness at the posterior right parotid/angle of the jaw  10. Dyspnea on exertion, ongoing for about a month, worsened after taxol/ramucirumab on 09/04/19 requiring ED visit on 5/23. CBC, CMP, troponin, BNP and chest xray negative   11.  Mucositis secondary to chemotherapy 09/17/2019  Disposition:  Michelle Cain appears stable. Her performance status has increased. Cough and dyspnea have improved. She continues to have sporadic vomiting but able to maintain adequate hydration and nutrition. Her weight is up from last visit.   Labs reviewed. Neutropenia resolved. CBC and CMP otherwise stable and adequate for treatment. Due to her recent neutropenia, Dr. Benay Spice has recommended reducing her chemotherapy to ramucirumab/paclitaxel every 2 weeks.   I informed the patient that her genetic result is normal per Roma Kayser. The report has not been scanned in yet. She understands.   She will proceed with cycle 2 day 1 ramucirumab and paclitaxtel today. Return for f/u and C2D15 in 2 weeks. The plan was discussed with Dr. Benay Spice.    Orders Placed This Encounter  Procedures  . Total Protein, Urine dipstick    Standing Status:   Future    Number of Occurrences:   1    Standing Expiration Date:   09/23/2020   All questions were answered. The patient knows to call the clinic with any problems, questions or concerns. No barriers to learning were detected.     Alla Feeling, NP 09/24/19

## 2019-09-24 ENCOUNTER — Telehealth: Payer: Self-pay | Admitting: Nurse Practitioner

## 2019-09-24 ENCOUNTER — Inpatient Hospital Stay: Payer: Medicare Other

## 2019-09-24 ENCOUNTER — Other Ambulatory Visit: Payer: Self-pay

## 2019-09-24 ENCOUNTER — Inpatient Hospital Stay (HOSPITAL_BASED_OUTPATIENT_CLINIC_OR_DEPARTMENT_OTHER): Payer: Medicare Other | Admitting: Nurse Practitioner

## 2019-09-24 ENCOUNTER — Encounter: Payer: Self-pay | Admitting: Nurse Practitioner

## 2019-09-24 VITALS — BP 114/75 | HR 93 | Temp 98.1°F | Resp 20 | Ht 64.0 in | Wt 212.7 lb

## 2019-09-24 DIAGNOSIS — C169 Malignant neoplasm of stomach, unspecified: Secondary | ICD-10-CM

## 2019-09-24 DIAGNOSIS — Z5112 Encounter for antineoplastic immunotherapy: Secondary | ICD-10-CM | POA: Diagnosis not present

## 2019-09-24 DIAGNOSIS — Z95828 Presence of other vascular implants and grafts: Secondary | ICD-10-CM

## 2019-09-24 LAB — CBC WITH DIFFERENTIAL (CANCER CENTER ONLY)
Abs Immature Granulocytes: 0.02 10*3/uL (ref 0.00–0.07)
Basophils Absolute: 0 10*3/uL (ref 0.0–0.1)
Basophils Relative: 0 %
Eosinophils Absolute: 0 10*3/uL (ref 0.0–0.5)
Eosinophils Relative: 0 %
HCT: 33.1 % — ABNORMAL LOW (ref 36.0–46.0)
Hemoglobin: 10.8 g/dL — ABNORMAL LOW (ref 12.0–15.0)
Immature Granulocytes: 0 %
Lymphocytes Relative: 30 %
Lymphs Abs: 1.4 10*3/uL (ref 0.7–4.0)
MCH: 32.1 pg (ref 26.0–34.0)
MCHC: 32.6 g/dL (ref 30.0–36.0)
MCV: 98.5 fL (ref 80.0–100.0)
Monocytes Absolute: 0.6 10*3/uL (ref 0.1–1.0)
Monocytes Relative: 14 %
Neutro Abs: 2.6 10*3/uL (ref 1.7–7.7)
Neutrophils Relative %: 56 %
Platelet Count: 318 10*3/uL (ref 150–400)
RBC: 3.36 MIL/uL — ABNORMAL LOW (ref 3.87–5.11)
RDW: 14.2 % (ref 11.5–15.5)
WBC Count: 4.7 10*3/uL (ref 4.0–10.5)
nRBC: 0 % (ref 0.0–0.2)

## 2019-09-24 LAB — CMP (CANCER CENTER ONLY)
ALT: 13 U/L (ref 0–44)
AST: 15 U/L (ref 15–41)
Albumin: 3.3 g/dL — ABNORMAL LOW (ref 3.5–5.0)
Alkaline Phosphatase: 74 U/L (ref 38–126)
Anion gap: 11 (ref 5–15)
BUN: 8 mg/dL (ref 6–20)
CO2: 24 mmol/L (ref 22–32)
Calcium: 8.6 mg/dL — ABNORMAL LOW (ref 8.9–10.3)
Chloride: 108 mmol/L (ref 98–111)
Creatinine: 0.81 mg/dL (ref 0.44–1.00)
GFR, Est AFR Am: >60 mL/min (ref >60)
GFR, Estimated: >60 mL/min (ref >60)
Glucose, Bld: 94 mg/dL (ref 70–99)
Potassium: 3.9 mmol/L (ref 3.5–5.1)
Sodium: 143 mmol/L (ref 135–145)
Total Bilirubin: 0.3 mg/dL (ref 0.3–1.2)
Total Protein: 6.8 g/dL (ref 6.5–8.1)

## 2019-09-24 LAB — TOTAL PROTEIN, URINE DIPSTICK: Protein, ur: 100 mg/dL — AB

## 2019-09-24 MED ORDER — DIPHENHYDRAMINE HCL 50 MG/ML IJ SOLN
INTRAMUSCULAR | Status: AC
  Filled 2019-09-24: qty 1

## 2019-09-24 MED ORDER — FAMOTIDINE IN NACL 20-0.9 MG/50ML-% IV SOLN: 20.0000 mg | Freq: Once | INTRAVENOUS | Status: DC

## 2019-09-24 MED ORDER — SODIUM CHLORIDE 0.9% FLUSH
10.0000 mL | INTRAVENOUS | Status: DC | PRN
  Administered 2019-09-24: 10 mL
  Filled 2019-09-24: qty 10

## 2019-09-24 MED ORDER — SODIUM CHLORIDE 0.9 % IV SOLN
8.0000 mg/kg | Freq: Once | INTRAVENOUS | Status: AC
  Administered 2019-09-24: 800 mg via INTRAVENOUS
  Filled 2019-09-24: qty 50

## 2019-09-24 MED ORDER — HEPARIN SOD (PORK) LOCK FLUSH 100 UNIT/ML IV SOLN
500.0000 [IU] | Freq: Once | INTRAVENOUS | Status: AC | PRN
  Administered 2019-09-24: 500 [IU]
  Filled 2019-09-24: qty 5

## 2019-09-24 MED ORDER — DIPHENHYDRAMINE HCL 50 MG/ML IJ SOLN
25.0000 mg | Freq: Once | INTRAMUSCULAR | Status: AC
  Administered 2019-09-24: 25 mg via INTRAVENOUS

## 2019-09-24 MED ORDER — SODIUM CHLORIDE 0.9 % IV SOLN
10.0000 mg | Freq: Once | INTRAVENOUS | Status: AC
  Administered 2019-09-24: 10 mg via INTRAVENOUS
  Filled 2019-09-24: qty 10

## 2019-09-24 MED ORDER — SODIUM CHLORIDE 0.9 % IV SOLN
Freq: Once | INTRAVENOUS | Status: AC
  Filled 2019-09-24: qty 250

## 2019-09-24 MED ORDER — SODIUM CHLORIDE 0.9 % IV SOLN
60.0000 mg/m2 | Freq: Once | INTRAVENOUS | Status: AC
  Administered 2019-09-24: 126 mg via INTRAVENOUS
  Filled 2019-09-24: qty 21

## 2019-09-24 MED ORDER — SODIUM CHLORIDE 0.9% FLUSH
10.0000 mL | INTRAVENOUS | Status: DC | PRN
  Administered 2019-09-24: 10 mL via INTRAVENOUS
  Filled 2019-09-24: qty 10

## 2019-09-24 MED ORDER — FAMOTIDINE IN NACL 20-0.9 MG/50ML-% IV SOLN
20.0000 mg | Freq: Once | INTRAVENOUS | Status: AC
  Administered 2019-09-24: 20 mg via INTRAVENOUS

## 2019-09-24 MED ORDER — FAMOTIDINE IN NACL 20-0.9 MG/50ML-% IV SOLN
INTRAVENOUS | Status: AC
  Filled 2019-09-24: qty 50

## 2019-09-24 MED ORDER — MAGIC MOUTHWASH: 5.0000 mL | Freq: Four times a day (QID) | ORAL | 1 refills | Status: DC | PRN

## 2019-09-24 NOTE — Telephone Encounter (Signed)
Scheduled appt per 6/9 los.  Pt will get a print out in treatment

## 2019-09-24 NOTE — Patient Instructions (Signed)
[facility] Discharge Instructions for Patients Receiving Chemotherapy  Today you received the following chemotherapy agents: Cyramza & Taxol  To help prevent nausea and vomiting after your treatment, we encourage you to take your nausea medication as prescribed.   If you develop nausea and vomiting that is not controlled by your nausea medication, call the clinic.   BELOW ARE SYMPTOMS THAT SHOULD BE REPORTED IMMEDIATELY:  *FEVER GREATER THAN 100.5 F  *CHILLS WITH OR WITHOUT FEVER  NAUSEA AND VOMITING THAT IS NOT CONTROLLED WITH YOUR NAUSEA MEDICATION  *UNUSUAL SHORTNESS OF BREATH  *UNUSUAL BRUISING OR BLEEDING  TENDERNESS IN MOUTH AND THROAT WITH OR WITHOUT PRESENCE OF ULCERS  *URINARY PROBLEMS  *BOWEL PROBLEMS  UNUSUAL RASH Items with * indicate a potential emergency and should be followed up as soon as possible.  Feel free to call the clinic should you have any questions or concerns. The clinic phone number is [phone].  Please show the [facility] at check-in to the Emergency Department and triage nurse.    Paclitaxel injection What is this medicine? PACLITAXEL (PAK li TAX el) is a chemotherapy drug. It targets fast dividing cells, like cancer cells, and causes these cells to die. This medicine is used to treat ovarian cancer, breast cancer, lung cancer, Kaposi's sarcoma, and other cancers. This medicine may be used for other purposes; ask your health care provider or pharmacist if you have questions. COMMON BRAND NAME(S): Onxol, Taxol What should I tell my health care provider before I take this medicine? They need to know if you have any of these conditions:  history of irregular heartbeat  liver disease  low blood counts, like low white cell, platelet, or red cell counts  lung or breathing disease, like asthma  tingling of the fingers or toes, or other nerve disorder  an unusual or allergic reaction to paclitaxel, alcohol,  polyoxyethylated castor oil, other chemotherapy, other medicines, foods, dyes, or preservatives  pregnant or trying to get pregnant  breast-feeding How should I use this medicine? This drug is given as an infusion into a vein. It is administered in a hospital or clinic by a specially trained health care professional. Talk to your pediatrician regarding the use of this medicine in children. Special care may be needed. Overdosage: If you think you have taken too much of this medicine contact a poison control center or emergency room at once. NOTE: This medicine is only for you. Do not share this medicine with others. What if I miss a dose? It is important not to miss your dose. Call your doctor or health care professional if you are unable to keep an appointment. What may interact with this medicine? Do not take this medicine with any of the following medications:  disulfiram  metronidazole This medicine may also interact with the following medications:  antiviral medicines for hepatitis, HIV or AIDS  certain antibiotics like erythromycin and clarithromycin  certain medicines for fungal infections like ketoconazole and itraconazole  certain medicines for seizures like carbamazepine, phenobarbital, phenytoin  gemfibrozil  nefazodone  rifampin  St. John's wort This list may not describe all possible interactions. Give your health care provider a list of all the medicines, herbs, non-prescription drugs, or dietary supplements you use. Also tell them if you smoke, drink alcohol, or use illegal drugs. Some items may interact with your medicine. What should I watch for while using this medicine? Your condition will be monitored carefully while you are receiving this medicine. You will  need important blood work done while you are taking this medicine. This medicine can cause serious allergic reactions. To reduce your risk you will need to take other medicine(s) before treatment with this  medicine. If you experience allergic reactions like skin rash, itching or hives, swelling of the face, lips, or tongue, tell your doctor or health care professional right away. In some cases, you may be given additional medicines to help with side effects. Follow all directions for their use. This drug may make you feel generally unwell. This is not uncommon, as chemotherapy can affect healthy cells as well as cancer cells. Report any side effects. Continue your course of treatment even though you feel ill unless your doctor tells you to stop. Call your doctor or health care professional for advice if you get a fever, chills or sore throat, or other symptoms of a cold or flu. Do not treat yourself. This drug decreases your body's ability to fight infections. Try to avoid being around people who are sick. This medicine may increase your risk to bruise or bleed. Call your doctor or health care professional if you notice any unusual bleeding. Be careful brushing and flossing your teeth or using a toothpick because you may get an infection or bleed more easily. If you have any dental work done, tell your dentist you are receiving this medicine. Avoid taking products that contain aspirin, acetaminophen, ibuprofen, naproxen, or ketoprofen unless instructed by your doctor. These medicines may hide a fever. Do not become pregnant while taking this medicine. Women should inform their doctor if they wish to become pregnant or think they might be pregnant. There is a potential for serious side effects to an unborn child. Talk to your health care professional or pharmacist for more information. Do not breast-feed an infant while taking this medicine. Men are advised not to father a child while receiving this medicine. This product may contain alcohol. Ask your pharmacist or healthcare provider if this medicine contains alcohol. Be sure to tell all healthcare providers you are taking this medicine. Certain medicines,  like metronidazole and disulfiram, can cause an unpleasant reaction when taken with alcohol. The reaction includes flushing, headache, nausea, vomiting, sweating, and increased thirst. The reaction can last from 30 minutes to several hours. What side effects may I notice from receiving this medicine? Side effects that you should report to your doctor or health care professional as soon as possible:  allergic reactions like skin rash, itching or hives, swelling of the face, lips, or tongue  breathing problems  changes in vision  fast, irregular heartbeat  high or low blood pressure  mouth sores  pain, tingling, numbness in the hands or feet  signs of decreased platelets or bleeding - bruising, pinpoint red spots on the skin, black, tarry stools, blood in the urine  signs of decreased red blood cells - unusually weak or tired, feeling faint or lightheaded, falls  signs of infection - fever or chills, cough, sore throat, pain or difficulty passing urine  signs and symptoms of liver injury like dark yellow or brown urine; general ill feeling or flu-like symptoms; light-colored stools; loss of appetite; nausea; right upper belly pain; unusually weak or tired; yellowing of the eyes or skin  swelling of the ankles, feet, hands  unusually slow heartbeat Side effects that usually do not require medical attention (report to your doctor or health care professional if they continue or are bothersome):  diarrhea  hair loss  loss of appetite  muscle  or joint pain  nausea, vomiting  pain, redness, or irritation at site where injected  tiredness This list may not describe all possible side effects. Call your doctor for medical advice about side effects. You may report side effects to FDA at 1-800-FDA-1088. Where should I keep my medicine? This drug is given in a hospital or clinic and will not be stored at home. NOTE: This sheet is a summary. It may not cover all possible information.  If you have questions about this medicine, talk to your doctor, pharmacist, or health care provider.  2020 Elsevier/Gold Standard (2016-12-05 13:14:55)   Ramucirumab injection What is this medicine? RAMUCIRUMAB (ra mue SIR ue mab) is a monoclonal antibody. It is used to treat stomach cancer, colorectal cancer, liver cancer, and lung cancer. This medicine may be used for other purposes; ask your health care provider or pharmacist if you have questions. COMMON BRAND NAME(S): Cyramza What should I tell my health care provider before I take this medicine? They need to know if you have any of these conditions:  bleeding disorders  blood clots  heart disease, including heart failure, heart attack, or chest pain (angina)  high blood pressure  infection (especially a virus infection such as chickenpox, cold sores, or herpes)  protein in your urine  recent or planning to have surgery  stroke  an unusual or allergic reaction to ramucirumab, other medicines, foods, dyes, or preservatives  pregnant or trying to get pregnant  breast-feeding How should I use this medicine? This medicine is for infusion into a vein. It is given by a health care professional in a hospital or clinic setting. Talk to your pediatrician regarding the use of this medicine in children. Special care may be needed. Overdosage: If you think you have taken too much of this medicine contact a poison control center or emergency room at once. NOTE: This medicine is only for you. Do not share this medicine with others. What if I miss a dose? It is important not to miss your dose. Call your doctor or health care professional if you are unable to keep an appointment. What may interact with this medicine? Interactions have not been studied. This list may not describe all possible interactions. Give your health care provider a list of all the medicines, herbs, non-prescription drugs, or dietary supplements you use. Also  tell them if you smoke, drink alcohol, or use illegal drugs. Some items may interact with your medicine. What should I watch for while using this medicine? Your condition will be monitored carefully while you are receiving this medicine. You will need to to check your blood pressure and have your blood and urine tested while you are taking this medicine. Your condition will be monitored carefully while you are receiving this medicine. This medicine may increase your risk to bruise or bleed. Call your doctor or health care professional if you notice any unusual bleeding. Before having surgery, talk to your health care provider to make sure it is ok. This drug can increase the risk of poor healing of your surgical site or wound. You will need to stop this drug for 28 days before surgery. After surgery, wait at least 2 weeks before restarting this drug. Make sure the surgical site or wound is healed enough before restarting this drug. Talk to your health care provider if questions. Do not become pregnant while taking this medicine or for 3 months after stopping it. Women should inform their doctor if they wish to become pregnant  or think they might be pregnant. There is a potential for serious side effects to an unborn child. Talk to your health care professional or pharmacist for more information. Do not breast-feed an infant while taking this medicine or for 2 months after stopping it. This medicine may interfere with the ability to have a child. Talk with your doctor or health care professional if you are concerned about your fertility. What side effects may I notice from receiving this medicine? Side effects that you should report to your doctor or health care professional as soon as possible:  allergic reactions like skin rash, itching or hives, breathing problems, swelling of the face, lips, or tongue  signs of infection - fever or chills, cough, sore throat  chest pain or chest  tightness  confusion  dizziness  feeling faint or lightheaded, falls  severe abdominal pain  severe nausea, vomiting  signs and symptoms of bleeding such as bloody or black, tarry stools; red or dark-brown urine; spitting up blood or brown material that looks like coffee grounds; red spots on the skin; unusual bruising or bleeding from the eye, gums, or nose  signs and symptoms of a blood clot such as breathing problems; changes in vision; chest pain; severe, sudden headache; pain, swelling, warmth in the leg; trouble speaking; sudden numbness or weakness of the face, arm or leg  symptoms of a stroke: change in mental awareness, inability to talk or move one side of the body  trouble walking, dizziness, loss of balance or coordination Side effects that usually do not require medical attention (report to your doctor or health care professional if they continue or are bothersome):  cold, clammy skin  constipation  diarrhea  headache  nausea, vomiting  stomach pain  unusually slow heartbeat  unusually weak or tired This list may not describe all possible side effects. Call your doctor for medical advice about side effects. You may report side effects to FDA at 1-800-FDA-1088. Where should I keep my medicine? This drug is given in a hospital or clinic and will not be stored at home. NOTE: This sheet is a summary. It may not cover all possible information. If you have questions about this medicine, talk to your doctor, pharmacist, or health care provider.  2020 Elsevier/Gold Standard (2019-01-29 11:17:50)

## 2019-09-24 NOTE — Progress Notes (Signed)
Ok to treat with urine protein 100 per Cira Rue, NP

## 2019-09-24 NOTE — Addendum Note (Signed)
Addended by: Alla Feeling on: 09/24/2019 01:07 PM   Modules accepted: Orders

## 2019-10-01 ENCOUNTER — Other Ambulatory Visit: Payer: Medicare Other

## 2019-10-01 ENCOUNTER — Ambulatory Visit: Payer: Medicare Other

## 2019-10-01 ENCOUNTER — Ambulatory Visit: Payer: Medicare Other | Admitting: Nurse Practitioner

## 2019-10-01 ENCOUNTER — Inpatient Hospital Stay: Payer: Medicare Other | Admitting: Nutrition

## 2019-10-04 ENCOUNTER — Other Ambulatory Visit: Payer: Self-pay | Admitting: Oncology

## 2019-10-07 ENCOUNTER — Ambulatory Visit
Admission: RE | Admit: 2019-10-07 | Discharge: 2019-10-07 | Disposition: A | Discharge status: Home / Self Care | Payer: Self-pay | Source: Ambulatory Visit | Attending: Oncology | Admitting: Oncology

## 2019-10-07 ENCOUNTER — Other Ambulatory Visit: Payer: Self-pay | Admitting: *Deleted

## 2019-10-07 DIAGNOSIS — C169 Malignant neoplasm of stomach, unspecified: Secondary | ICD-10-CM

## 2019-10-07 NOTE — Progress Notes (Signed)
Received CD from [facility] of scans from 2019 and 2021. Took to [facility] in radiology to have loaded.

## 2019-10-08 ENCOUNTER — Ambulatory Visit: Payer: Medicare Other

## 2019-10-08 ENCOUNTER — Other Ambulatory Visit: Payer: Self-pay

## 2019-10-08 ENCOUNTER — Ambulatory Visit
Admission: RE | Admit: 2019-10-08 | Discharge: 2019-10-08 | Disposition: A | Discharge status: Home / Self Care | Payer: Self-pay | Source: Ambulatory Visit | Attending: Oncology | Admitting: Oncology

## 2019-10-08 ENCOUNTER — Inpatient Hospital Stay: Payer: Medicare Other

## 2019-10-08 ENCOUNTER — Other Ambulatory Visit (HOSPITAL_COMMUNITY): Payer: Self-pay | Admitting: Oncology

## 2019-10-08 ENCOUNTER — Other Ambulatory Visit: Payer: Medicare Other

## 2019-10-08 ENCOUNTER — Encounter: Payer: Self-pay | Admitting: Nurse Practitioner

## 2019-10-08 ENCOUNTER — Inpatient Hospital Stay (HOSPITAL_BASED_OUTPATIENT_CLINIC_OR_DEPARTMENT_OTHER): Payer: Medicare Other | Admitting: Nurse Practitioner

## 2019-10-08 VITALS — BP 111/81 | HR 79 | Temp 98.1°F | Resp 18 | Ht 64.0 in | Wt 211.4 lb

## 2019-10-08 DIAGNOSIS — C801 Malignant (primary) neoplasm, unspecified: Secondary | ICD-10-CM

## 2019-10-08 DIAGNOSIS — Z5112 Encounter for antineoplastic immunotherapy: Secondary | ICD-10-CM | POA: Diagnosis not present

## 2019-10-08 DIAGNOSIS — Z95828 Presence of other vascular implants and grafts: Secondary | ICD-10-CM

## 2019-10-08 DIAGNOSIS — D649 Anemia, unspecified: Secondary | ICD-10-CM | POA: Diagnosis not present

## 2019-10-08 DIAGNOSIS — C169 Malignant neoplasm of stomach, unspecified: Secondary | ICD-10-CM

## 2019-10-08 DIAGNOSIS — Z5111 Encounter for antineoplastic chemotherapy: Secondary | ICD-10-CM

## 2019-10-08 LAB — CBC WITH DIFFERENTIAL (CANCER CENTER ONLY)
Abs Immature Granulocytes: 0.01 10*3/uL (ref 0.00–0.07)
Basophils Absolute: 0 10*3/uL (ref 0.0–0.1)
Basophils Relative: 0 %
Eosinophils Absolute: 0 10*3/uL (ref 0.0–0.5)
Eosinophils Relative: 1 %
HCT: 31.8 % — ABNORMAL LOW (ref 36.0–46.0)
Hemoglobin: 10.5 g/dL — ABNORMAL LOW (ref 12.0–15.0)
Immature Granulocytes: 0 %
Lymphocytes Relative: 32 %
Lymphs Abs: 1.3 10*3/uL (ref 0.7–4.0)
MCH: 32.4 pg (ref 26.0–34.0)
MCHC: 33 g/dL (ref 30.0–36.0)
MCV: 98.1 fL (ref 80.0–100.0)
Monocytes Absolute: 0.5 10*3/uL (ref 0.1–1.0)
Monocytes Relative: 12 %
Neutro Abs: 2.3 10*3/uL (ref 1.7–7.7)
Neutrophils Relative %: 55 %
Platelet Count: 250 10*3/uL (ref 150–400)
RBC: 3.24 MIL/uL — ABNORMAL LOW (ref 3.87–5.11)
RDW: 14.4 % (ref 11.5–15.5)
WBC Count: 4.1 10*3/uL (ref 4.0–10.5)
nRBC: 0 % (ref 0.0–0.2)

## 2019-10-08 LAB — CMP (CANCER CENTER ONLY)
ALT: 12 U/L (ref 0–44)
AST: 15 U/L (ref 15–41)
Albumin: 3.3 g/dL — ABNORMAL LOW (ref 3.5–5.0)
Alkaline Phosphatase: 74 U/L (ref 38–126)
Anion gap: 6 (ref 5–15)
BUN: 10 mg/dL (ref 6–20)
CO2: 27 mmol/L (ref 22–32)
Calcium: 8.5 mg/dL — ABNORMAL LOW (ref 8.9–10.3)
Chloride: 106 mmol/L (ref 98–111)
Creatinine: 0.8 mg/dL (ref 0.44–1.00)
GFR, Est AFR Am: >60 mL/min (ref >60)
GFR, Estimated: >60 mL/min (ref >60)
Glucose, Bld: 92 mg/dL (ref 70–99)
Potassium: 4.2 mmol/L (ref 3.5–5.1)
Sodium: 139 mmol/L (ref 135–145)
Total Bilirubin: 0.5 mg/dL (ref 0.3–1.2)
Total Protein: 6.7 g/dL (ref 6.5–8.1)

## 2019-10-08 LAB — TOTAL PROTEIN, URINE DIPSTICK: Protein, ur: 100 mg/dL — AB

## 2019-10-08 MED ORDER — DIPHENHYDRAMINE HCL 50 MG/ML IJ SOLN
25.0000 mg | Freq: Once | INTRAMUSCULAR | Status: AC
  Administered 2019-10-08: 25 mg via INTRAVENOUS

## 2019-10-08 MED ORDER — FAMOTIDINE IN NACL 20-0.9 MG/50ML-% IV SOLN
20.0000 mg | Freq: Once | INTRAVENOUS | Status: AC
  Administered 2019-10-08: 20 mg via INTRAVENOUS

## 2019-10-08 MED ORDER — SODIUM CHLORIDE 0.9 % IV SOLN
8.0000 mg/kg | Freq: Once | INTRAVENOUS | Status: AC
  Administered 2019-10-08: 800 mg via INTRAVENOUS
  Filled 2019-10-08: qty 50

## 2019-10-08 MED ORDER — SODIUM CHLORIDE 0.9 % IV SOLN
10.0000 mg | Freq: Once | INTRAVENOUS | Status: AC
  Administered 2019-10-08: 10 mg via INTRAVENOUS
  Filled 2019-10-08: qty 10

## 2019-10-08 MED ORDER — SODIUM CHLORIDE 0.9% FLUSH
10.0000 mL | INTRAVENOUS | Status: DC | PRN
  Administered 2019-10-08: 10 mL via INTRAVENOUS
  Filled 2019-10-08: qty 10

## 2019-10-08 MED ORDER — FAMOTIDINE IN NACL 20-0.9 MG/50ML-% IV SOLN
INTRAVENOUS | Status: AC
  Filled 2019-10-08: qty 50

## 2019-10-08 MED ORDER — SODIUM CHLORIDE 0.9% FLUSH
10.0000 mL | INTRAVENOUS | Status: DC | PRN
  Administered 2019-10-08: 10 mL
  Filled 2019-10-08: qty 10

## 2019-10-08 MED ORDER — SODIUM CHLORIDE 0.9 % IV SOLN
Freq: Once | INTRAVENOUS | Status: AC
  Filled 2019-10-08: qty 250

## 2019-10-08 MED ORDER — DIPHENHYDRAMINE HCL 50 MG/ML IJ SOLN
INTRAMUSCULAR | Status: AC
  Filled 2019-10-08: qty 1

## 2019-10-08 MED ORDER — HEPARIN SOD (PORK) LOCK FLUSH 100 UNIT/ML IV SOLN
500.0000 [IU] | Freq: Once | INTRAVENOUS | Status: AC | PRN
  Administered 2019-10-08: 500 [IU]
  Filled 2019-10-08: qty 5

## 2019-10-08 MED ORDER — SODIUM CHLORIDE 0.9 % IV SOLN
60.0000 mg/m2 | Freq: Once | INTRAVENOUS | Status: AC
  Administered 2019-10-08: 126 mg via INTRAVENOUS
  Filled 2019-10-08: qty 21

## 2019-10-08 NOTE — Progress Notes (Signed)
[facility] OFFICE PROGRESS NOTE   Diagnosis: Gastric cancer  INTERVAL HISTORY:   [name] returns as scheduled.  She completed a cycle of ramucirumab/Taxol 09/24/2019.  She denies nausea/vomiting.  No mouth sores.  No diarrhea.  Stable numbness in the hands and feet.  One minor nosebleed.  No rash.  Occasional shortness of breath.  No chest pain.  She reports a recent fall stating she "tripped".  No injury.  Objective:  Vital signs in last 24 hours:  Blood pressure 111/81, pulse 79, temperature 98.1 F (36.7 C), resp. rate 18, height _0  (1.626 m), weight 211 lb 6.4 oz (95.9 kg), SpO2 97 %.    HEENT: No thrush or ulcers. Resp: Lungs clear bilaterally. Cardio: Regular rate and rhythm. GI: Abdomen soft and nontender.  No hepatomegaly. Vascular: No leg edema. Skin: Palms with scattered areas of dry peeling skin. Port-A-Cath without erythema.  Lab Results:  Lab Results  Component Value Date   WBC 4.1 10/08/2019   HGB 10.5 (L) 10/08/2019   HCT 31.8 (L) 10/08/2019   MCV 98.1 10/08/2019   PLT 250 10/08/2019   NEUTROABS 2.3 10/08/2019    Imaging:  No results found.  Medications: I have reviewed the patient's current medications.  Assessment/Plan: 1. Gastric cancer ? Presenting with dysphagia spring 2019 ? 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 CTs revealed wall thickening and luminal narrowing of the colon at the hepatic flexure and cecum ? PET scan with no evidence of distant metastatic disease or abnormal uptake other than thickening of the distal esophagus and proximal stomach ? 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 CTs 10/23/2017-diffuse prominence of the gastric wall, especially the antrum, focal wall thickening of the distal ascending colon and hepatic flexure, thickening of the cecum, nonspecific haziness of the mesenteric fat in the pelvis, mild prominence of lymph nodes at the greater curvature of the stomach ? Treatment with FOLFOX beginning July 2019 ? CTs October 2019-stable disease, FOLFOX continued ? CTs December 2019-stable disease ? January 2020 treatment transition to Xeloda maintenance, care transition to [facility] ? CTs in June 2020 in September 2020-stable disease, Xeloda continued ? CTs 06/23/2019-nonspecific thickening of the GE junction, intraluminal bladder mass ? Cystoscopy-metastatic signet ring cell adenocarcinoma ? Cycle 1 day 1 ramucirumab/paclitaxel 08/19/2019(given atanother facility) ? Cycle 1 day 1 ramucirumab/paclitaxel 09/04/2019 ? Cycle 1 day 8 Taxol 09/10/19 ? Cycle 1 day 15 ramucirumab/paclitaxel 09/17/2019 canceled d/t neutropenia ? Ramucirumab/pactlitaxel every 2 weeks 09/24/2019 ? Ramucirumab/paclitaxel 10/08/2019  2.Left breast cancer 2008,pT1c,pN0, status post a left lumpectomy with adjuvant chemotherapy and radiation, ER positive, PR positive, HER-2 positive  3.Mixed connective tissue disease/SLE  4.Lower extremity deep vein thrombosis maintained on apixaban  5.Family history of multiple cancers including breast and ovarian cancer  6.Dysphagia and intermittent vomiting secondary to #1  7.Hypertension  8.Peripheral neuropathy  9.Masslike fullness at the posterior right parotid/angle of the jaw  10. Dyspnea on  exertion, ongoing for about a month, worsened after  taxol/ramucirumab on 09/04/19 requiring ED visit on 5/23. CBC, CMP, troponin, BNP and chest xray negative  11.Mucositis secondary to chemotherapy 09/17/2019  Disposition: [name] appears stable.  Plan to continue Taxol/ramucirumab every 2 weeks, treatment today.  We reviewed the CBC and chemistry panel from today.  Labs are adequate to proceed.  Urine with a stable level of protein.  She will return for lab, follow-up, the next cycle in 2 weeks.  She will contact the office in the interim with any problems.    Ned Card ANP/GNP-BC   10/08/2019  12:55 PM

## 2019-10-08 NOTE — Progress Notes (Signed)
Per Dr. Benay Spice: OK to treat w/urine protein 100

## 2019-10-08 NOTE — Patient Instructions (Signed)
[facility] Discharge Instructions for Patients Receiving Chemotherapy  Today you received the following chemotherapy agents: Ramucirumab and Paclitaxel  To help prevent nausea and vomiting after your treatment, we encourage you to take your nausea medication as prescribed.   If you develop nausea and vomiting that is not controlled by your nausea medication, call the clinic.   BELOW ARE SYMPTOMS THAT SHOULD BE REPORTED IMMEDIATELY:  *FEVER GREATER THAN 100.5 F  *CHILLS WITH OR WITHOUT FEVER  NAUSEA AND VOMITING THAT IS NOT CONTROLLED WITH YOUR NAUSEA MEDICATION  *UNUSUAL SHORTNESS OF BREATH  *UNUSUAL BRUISING OR BLEEDING  TENDERNESS IN MOUTH AND THROAT WITH OR WITHOUT PRESENCE OF ULCERS  *URINARY PROBLEMS  *BOWEL PROBLEMS  UNUSUAL RASH Items with * indicate a potential emergency and should be followed up as soon as possible.  Feel free to call the clinic should you have any questions or concerns. The clinic phone number is [phone].  Please show the [facility] at check-in to the Emergency Department and triage nurse.

## 2019-10-09 ENCOUNTER — Telehealth: Payer: Self-pay | Admitting: Nurse Practitioner

## 2019-10-09 NOTE — Telephone Encounter (Signed)
Scheduled per 6/23 los. Pt is aware of appt times and dates.

## 2019-10-15 ENCOUNTER — Ambulatory Visit: Payer: Medicare Other

## 2019-10-15 ENCOUNTER — Other Ambulatory Visit: Payer: Medicare Other

## 2019-10-15 ENCOUNTER — Ambulatory Visit: Payer: Medicare Other | Admitting: Oncology

## 2019-10-20 ENCOUNTER — Other Ambulatory Visit: Payer: Self-pay | Admitting: Oncology

## 2019-10-22 ENCOUNTER — Other Ambulatory Visit: Payer: Self-pay | Admitting: Nurse Practitioner

## 2019-10-22 ENCOUNTER — Inpatient Hospital Stay: Payer: Medicare Other | Admitting: Nutrition

## 2019-10-22 ENCOUNTER — Other Ambulatory Visit: Payer: Self-pay

## 2019-10-22 ENCOUNTER — Encounter: Payer: Self-pay | Admitting: Nurse Practitioner

## 2019-10-22 ENCOUNTER — Inpatient Hospital Stay: Payer: Medicare Other | Attending: Oncology

## 2019-10-22 ENCOUNTER — Inpatient Hospital Stay (HOSPITAL_BASED_OUTPATIENT_CLINIC_OR_DEPARTMENT_OTHER): Payer: Medicare Other | Admitting: Nurse Practitioner

## 2019-10-22 ENCOUNTER — Telehealth: Payer: Self-pay

## 2019-10-22 ENCOUNTER — Inpatient Hospital Stay: Payer: Medicare Other

## 2019-10-22 VITALS — BP 111/77 | HR 89 | Temp 97.5°F | Resp 18 | Ht 64.0 in | Wt 209.3 lb

## 2019-10-22 DIAGNOSIS — Z5111 Encounter for antineoplastic chemotherapy: Secondary | ICD-10-CM

## 2019-10-22 DIAGNOSIS — E538 Deficiency of other specified B group vitamins: Secondary | ICD-10-CM

## 2019-10-22 DIAGNOSIS — C169 Malignant neoplasm of stomach, unspecified: Secondary | ICD-10-CM

## 2019-10-22 DIAGNOSIS — Z5112 Encounter for antineoplastic immunotherapy: Secondary | ICD-10-CM | POA: Diagnosis present

## 2019-10-22 DIAGNOSIS — G629 Polyneuropathy, unspecified: Secondary | ICD-10-CM | POA: Insufficient documentation

## 2019-10-22 DIAGNOSIS — D649 Anemia, unspecified: Secondary | ICD-10-CM

## 2019-10-22 DIAGNOSIS — K1231 Oral mucositis (ulcerative) due to antineoplastic therapy: Secondary | ICD-10-CM | POA: Insufficient documentation

## 2019-10-22 DIAGNOSIS — R1110 Vomiting, unspecified: Secondary | ICD-10-CM | POA: Diagnosis not present

## 2019-10-22 DIAGNOSIS — R809 Proteinuria, unspecified: Secondary | ICD-10-CM | POA: Diagnosis not present

## 2019-10-22 DIAGNOSIS — I10 Essential (primary) hypertension: Secondary | ICD-10-CM | POA: Insufficient documentation

## 2019-10-22 DIAGNOSIS — D709 Neutropenia, unspecified: Secondary | ICD-10-CM | POA: Diagnosis not present

## 2019-10-22 DIAGNOSIS — R310 Gross hematuria: Secondary | ICD-10-CM | POA: Insufficient documentation

## 2019-10-22 DIAGNOSIS — Z95828 Presence of other vascular implants and grafts: Secondary | ICD-10-CM | POA: Insufficient documentation

## 2019-10-22 DIAGNOSIS — Z853 Personal history of malignant neoplasm of breast: Secondary | ICD-10-CM | POA: Insufficient documentation

## 2019-10-22 LAB — TOTAL PROTEIN, URINE DIPSTICK: Protein, ur: 300 mg/dL — AB

## 2019-10-22 LAB — CBC WITH DIFFERENTIAL (CANCER CENTER ONLY)
Abs Immature Granulocytes: 0.02 10*3/uL (ref 0.00–0.07)
Basophils Absolute: 0 10*3/uL (ref 0.0–0.1)
Basophils Relative: 0 %
Eosinophils Absolute: 0 10*3/uL (ref 0.0–0.5)
Eosinophils Relative: 1 %
HCT: 31.8 % — ABNORMAL LOW (ref 36.0–46.0)
Hemoglobin: 10.3 g/dL — ABNORMAL LOW (ref 12.0–15.0)
Immature Granulocytes: 1 %
Lymphocytes Relative: 40 %
Lymphs Abs: 1.3 10*3/uL (ref 0.7–4.0)
MCH: 31.3 pg (ref 26.0–34.0)
MCHC: 32.4 g/dL (ref 30.0–36.0)
MCV: 96.7 fL (ref 80.0–100.0)
Monocytes Absolute: 0.5 10*3/uL (ref 0.1–1.0)
Monocytes Relative: 15 %
Neutro Abs: 1.5 10*3/uL — ABNORMAL LOW (ref 1.7–7.7)
Neutrophils Relative %: 43 %
Platelet Count: 240 10*3/uL (ref 150–400)
RBC: 3.29 MIL/uL — ABNORMAL LOW (ref 3.87–5.11)
RDW: 14.7 % (ref 11.5–15.5)
WBC Count: 3.3 10*3/uL — ABNORMAL LOW (ref 4.0–10.5)
nRBC: 0 % (ref 0.0–0.2)

## 2019-10-22 LAB — CMP (CANCER CENTER ONLY)
ALT: 11 U/L (ref 0–44)
AST: 16 U/L (ref 15–41)
Albumin: 3.4 g/dL — ABNORMAL LOW (ref 3.5–5.0)
Alkaline Phosphatase: 67 U/L (ref 38–126)
Anion gap: 8 (ref 5–15)
BUN: 13 mg/dL (ref 6–20)
CO2: 24 mmol/L (ref 22–32)
Calcium: 8.7 mg/dL — ABNORMAL LOW (ref 8.9–10.3)
Chloride: 106 mmol/L (ref 98–111)
Creatinine: 0.89 mg/dL (ref 0.44–1.00)
GFR, Est AFR Am: >60 mL/min (ref >60)
GFR, Estimated: >60 mL/min (ref >60)
Glucose, Bld: 82 mg/dL (ref 70–99)
Potassium: 3.9 mmol/L (ref 3.5–5.1)
Sodium: 138 mmol/L (ref 135–145)
Total Bilirubin: 0.4 mg/dL (ref 0.3–1.2)
Total Protein: 6.6 g/dL (ref 6.5–8.1)

## 2019-10-22 LAB — URINALYSIS, COMPLETE (UACMP) WITH MICROSCOPIC
Bilirubin Urine: NEGATIVE
Glucose, UA: NEGATIVE mg/dL
Ketones, ur: NEGATIVE mg/dL
Nitrite: NEGATIVE
Protein, ur: 100 mg/dL — AB
RBC / HPF: >50 RBC/hpf — ABNORMAL HIGH (ref 0–5)
Specific Gravity, Urine: 1.024 (ref 1.005–1.030)
WBC, UA: >50 WBC/hpf — ABNORMAL HIGH (ref 0–5)
pH: 5 (ref 5.0–8.0)

## 2019-10-22 LAB — VITAMIN B12: Vitamin B-12: 162 pg/mL — ABNORMAL LOW (ref 180–914)

## 2019-10-22 MED ORDER — SODIUM CHLORIDE 0.9% FLUSH
10.0000 mL | Freq: Once | INTRAVENOUS | Status: AC
  Administered 2019-10-22: 10 mL
  Filled 2019-10-22: qty 10

## 2019-10-22 MED ORDER — FAMOTIDINE IN NACL 20-0.9 MG/50ML-% IV SOLN
INTRAVENOUS | Status: AC
  Filled 2019-10-22: qty 50

## 2019-10-22 MED ORDER — DIPHENHYDRAMINE HCL 50 MG/ML IJ SOLN
INTRAMUSCULAR | Status: AC
  Filled 2019-10-22: qty 1

## 2019-10-22 MED ORDER — SODIUM CHLORIDE 0.9 % IV SOLN
8.0000 mg/kg | Freq: Once | INTRAVENOUS | Status: AC
  Administered 2019-10-22: 800 mg via INTRAVENOUS
  Filled 2019-10-22: qty 50

## 2019-10-22 MED ORDER — HEPARIN SOD (PORK) LOCK FLUSH 100 UNIT/ML IV SOLN
500.0000 [IU] | Freq: Once | INTRAVENOUS | Status: AC | PRN
  Administered 2019-10-22: 500 [IU]
  Filled 2019-10-22: qty 5

## 2019-10-22 MED ORDER — SULFAMETHOXAZOLE-TRIMETHOPRIM 800-160 MG PO TABS: 1.0000 | ORAL_TABLET | Freq: Two times a day (BID) | ORAL | 0 refills | Status: AC

## 2019-10-22 MED ORDER — FAMOTIDINE IN NACL 20-0.9 MG/50ML-% IV SOLN
20.0000 mg | Freq: Once | INTRAVENOUS | Status: AC
  Administered 2019-10-22: 20 mg via INTRAVENOUS

## 2019-10-22 MED ORDER — SODIUM CHLORIDE 0.9 % IV SOLN
Freq: Once | INTRAVENOUS | Status: AC
  Filled 2019-10-22: qty 250

## 2019-10-22 MED ORDER — SODIUM CHLORIDE 0.9 % IV SOLN
10.0000 mg | Freq: Once | INTRAVENOUS | Status: AC
  Administered 2019-10-22: 10 mg via INTRAVENOUS
  Filled 2019-10-22: qty 10

## 2019-10-22 MED ORDER — DIPHENHYDRAMINE HCL 50 MG/ML IJ SOLN
25.0000 mg | Freq: Once | INTRAMUSCULAR | Status: AC
  Administered 2019-10-22: 25 mg via INTRAVENOUS

## 2019-10-22 MED ORDER — SODIUM CHLORIDE 0.9% FLUSH
10.0000 mL | INTRAVENOUS | Status: DC | PRN
  Administered 2019-10-22: 10 mL
  Filled 2019-10-22: qty 10

## 2019-10-22 MED ORDER — SODIUM CHLORIDE 0.9 % IV SOLN
60.0000 mg/m2 | Freq: Once | INTRAVENOUS | Status: AC
  Administered 2019-10-22: 126 mg via INTRAVENOUS
  Filled 2019-10-22: qty 21

## 2019-10-22 NOTE — Progress Notes (Addendum)
[place] OFFICE PROGRESS NOTE   Diagnosis: Gastric cancer  INTERVAL HISTORY:   [name] returns as scheduled.  She completed another cycle of ramucirumab/paclitaxel 10/08/2019.  She denies nausea/vomiting.  No mouth sores.  No diarrhea.  Stable neuropathy symptoms in the hands and feet.  No abdominal pain.  She denies bleeding.  Overall good appetite though she is unable to tolerate certain foods.  No leg swelling.  No urinary symptoms.  Objective:  Vital signs in last 24 hours:  Blood pressure 111/77, pulse 89, temperature (!) 97.5 F (36.4 C), temperature source Temporal, resp. rate 18, height _0  (1.626 m), weight 209 lb 4.8 oz (94.9 kg), SpO2 100 %.    HEENT: Mild white coating over tongue.  No buccal thrush.  No ulcers. Resp: Lungs clear bilaterally. Cardio: Regular rate and rhythm. GI: Abdomen soft and nontender.  No hepatomegaly. Vascular: No leg edema. Neuro: Vibratory sense mildly decreased over the fingertips per tuning fork exam. Skin: Palms dry appearing. Port-A-Cath without erythema.   Lab Results:  Lab Results  Component Value Date   WBC 3.3 (L) 10/22/2019   HGB 10.3 (L) 10/22/2019   HCT 31.8 (L) 10/22/2019   MCV 96.7 10/22/2019   PLT 240 10/22/2019   NEUTROABS 1.5 (L) 10/22/2019    Imaging:  No results found.  Medications: I have reviewed the patient's current medications.  Assessment/Plan: 1. Gastric cancer ? Presenting with dysphagia spring 2019 ? 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 CTs revealed wall thickening and luminal narrowing of the colon at the hepatic flexure and cecum ? PET scan with no evidence of distant metastatic  disease or abnormal uptake other than thickening of the distal esophagus and proximal stomach ? 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 CTs 10/23/2017-diffuse prominence of the gastric wall, especially the antrum, focal wall thickening of the distal ascending colon and hepatic flexure, thickening of the cecum, nonspecific haziness of the mesenteric fat in the pelvis, mild prominence of lymph nodes at the greater curvature of the stomach ? Treatment with FOLFOX beginning July 2019 ? CTs October 2019-stable disease, FOLFOX continued ? CTs December 2019-stable disease ? January 2020 treatment transition to Xeloda maintenance, care transition to [place] ? CTs in June 2020 in September 2020-stable disease, Xeloda continued ? CTs 06/23/2019-nonspecific thickening of the GE junction, intraluminal bladder mass ? Cystoscopy-metastatic signet ring cell adenocarcinoma ? Cycle 1 day 1 ramucirumab/paclitaxel 08/19/2019(given atanother facility) ? Cycle 1 day 1 ramucirumab/paclitaxel 09/04/2019 ? Cycle 1 day 8 Taxol 09/10/19 ? Cycle 1 day 15 ramucirumab/paclitaxel 6/2/2021canceled d/t neutropenia ? Ramucirumab/pactlitaxel every 2 weeks 09/24/2019 ? Ramucirumab/paclitaxel 10/08/2019 ? Ramucirumab/paclitaxel 10/22/2019  2.Left breast cancer 2008,pT1c,pN0, status post a left lumpectomy with adjuvant chemotherapy and radiation, ER positive, PR positive, HER-2 positive  3.Mixed connective tissue disease/SLE  4.Lower extremity deep vein thrombosis maintained on apixaban  5.Family history of multiple cancers including breast and ovarian  cancer  6.Dysphagia and intermittent vomiting secondary to #1  7.Hypertension  8.Peripheral neuropathy  9.Masslike fullness at the posterior right parotid/angle of the jaw  10. Dyspnea on exertion, ongoing for about a month, worsened after taxol/ramucirumab on 09/04/19 requiring ED visit on 5/23. CBC, CMP, troponin, BNP and chest xray negative  11.Mucositis secondary to chemotherapy 09/17/2019   Disposition: [name] appears stable.  She continues to tolerate treatment well.  Plan to proceed with Taxol today as scheduled.  We will decide on ramucirumab pending results of a urinalysis as the urine protein level is higher than her baseline.    We reviewed the CBC from today.  Counts adequate to proceed with Taxol.  She has mild neutropenia.  She understands to contact the office with fever, chills, other signs of infection.  As noted above the urine protein level is higher than baseline.  We are awaiting results of a urinalysis to decide on whether or not she will receive ramucirumab today.  The plan is for restaging CTs after the next cycle of treatment.  She will return for lab, follow-up, Taxol/ramucirumab in 2 weeks.  She will contact the office in the interim as outlined above or with any other problems.  Patient seen with Dr. Benay Spice.  Ned Card ANP/GNP-BC   10/22/2019  9:38 AM  Addendum 10/22/2019 11:18 AM-urinalysis is consistent with an infection.  There are greater than 50 RBCs and white blood cells per high-power field.  This is likely the source of the increased proteinuria.  Plan to begin antibiotic, follow-up on culture.  Proceed with ramucirumab today as scheduled.  This was a shared visit with Ned Card.  [name] has an improved performance status since beginning the Taxol/ramucirumab.  The proteinuria is likely related to a urinary tract infection.  She will begin antibiotics and we obtained a urine culture today.  The plan is to complete  2 more treatments prior to a restaging CT evaluation.  Michelle Manson, MD

## 2019-10-22 NOTE — Patient Instructions (Signed)
[facility] Discharge Instructions for Patients Receiving Chemotherapy  Today you received the following chemotherapy agents: Ramucirumab and Paclitaxel  To help prevent nausea and vomiting after your treatment, we encourage you to take your nausea medication as prescribed.   If you develop nausea and vomiting that is not controlled by your nausea medication, call the clinic.   BELOW ARE SYMPTOMS THAT SHOULD BE REPORTED IMMEDIATELY:  *FEVER GREATER THAN 100.5 F  *CHILLS WITH OR WITHOUT FEVER  NAUSEA AND VOMITING THAT IS NOT CONTROLLED WITH YOUR NAUSEA MEDICATION  *UNUSUAL SHORTNESS OF BREATH  *UNUSUAL BRUISING OR BLEEDING  TENDERNESS IN MOUTH AND THROAT WITH OR WITHOUT PRESENCE OF ULCERS  *URINARY PROBLEMS  *BOWEL PROBLEMS  UNUSUAL RASH Items with * indicate a potential emergency and should be followed up as soon as possible.  Feel free to call the clinic should you have any questions or concerns. The clinic phone number is [phone].  Please show the [facility] at check-in to the Emergency Department and triage nurse.

## 2019-10-22 NOTE — Telephone Encounter (Signed)
Pt made aware of lab results and instructions

## 2019-10-22 NOTE — Telephone Encounter (Signed)
-----   Message from Owens Shark, NP sent at 10/22/2019  2:32 PM EDT ----- Please let Michelle Cain know the B12 level from today was low.  She should start B12 1000 mcg p.o. daily.  We will give 1000 mcg subcu today and when she is here in 2 weeks.  Please let her nurse know I am entering the order for the B12 injection today.

## 2019-10-22 NOTE — Progress Notes (Signed)
Nutrition follow-up completed with patient diagnosed with gastric cancer followed by Dr. Benay Spice. Patient denies nutrition impact symptoms.  Nausea improved.  Constipation controlled.  Appetite is good. Weight decreased to 209.3 pounds on July 7 from 211.4 pounds on June 23.  Nutrition diagnosis: Unintended weight loss continues.  Intervention: Educated patient to continue strategies for increased calorie and protein intake and small frequent meals and snacks. Encourage patient to strive for weight maintenance. Continue bowel regimen. Oral nutrition supplements as tolerated. Provided contact information for questions between visits.  Monitoring, evaluation, goals: Patient will tolerate adequate calories and protein for minimal weight loss.  Next visit: Wednesday, July 21 during infusion.  **Disclaimer: This note was dictated with voice recognition software. Similar sounding words can inadvertently be transcribed and this note may contain transcription errors which may not have been corrected upon publication of note.**

## 2019-10-22 NOTE — Progress Notes (Signed)
Spoke w/ Lattie Haw, okay to treat with paclitaxel and ANC of 1.5 today.   Regarding the urine protein of 300, patient is getting full urinalysis today. Await results of the UA to determine if elevated protein level is likely due to infection vs drug toxicity. Patient gets frequent UTIs. This will determine plan for Cyramza.   Stacey aware to start premeds and to await paclitaxel administration until decision to give vs hold cyramza today is made. Paclitaxel needs to be given after Cyramza if given.   Demetrius Charity, PharmD, BCPS, [facility] Pharmacist Pharmacy Phone: [phone] 10/22/2019

## 2019-10-23 ENCOUNTER — Telehealth: Payer: Self-pay

## 2019-10-23 LAB — URINE CULTURE: Culture: <10000 — AB

## 2019-10-23 NOTE — Telephone Encounter (Signed)
Patient offered and declined B12 injection she will do Po meds and on return at next appt will reconsider injection if indicated

## 2019-10-24 ENCOUNTER — Telehealth: Payer: Self-pay | Admitting: Nurse Practitioner

## 2019-10-24 NOTE — Telephone Encounter (Signed)
Scheduled per 7/7 los. Called and spoke with pt, confirmed 8/4 appts

## 2019-11-02 ENCOUNTER — Other Ambulatory Visit: Payer: Self-pay | Admitting: Oncology

## 2019-11-05 ENCOUNTER — Inpatient Hospital Stay: Payer: Medicare Other | Admitting: Nutrition

## 2019-11-05 ENCOUNTER — Inpatient Hospital Stay: Payer: Medicare Other

## 2019-11-05 ENCOUNTER — Inpatient Hospital Stay (HOSPITAL_BASED_OUTPATIENT_CLINIC_OR_DEPARTMENT_OTHER): Payer: Medicare Other | Admitting: Oncology

## 2019-11-05 ENCOUNTER — Other Ambulatory Visit: Payer: Self-pay

## 2019-11-05 ENCOUNTER — Ambulatory Visit: Payer: Medicare Other | Admitting: Nutrition

## 2019-11-05 VITALS — BP 108/75 | HR 88 | Temp 97.6°F | Resp 17 | Ht 64.0 in | Wt 206.9 lb

## 2019-11-05 DIAGNOSIS — C169 Malignant neoplasm of stomach, unspecified: Secondary | ICD-10-CM

## 2019-11-05 DIAGNOSIS — R809 Proteinuria, unspecified: Secondary | ICD-10-CM

## 2019-11-05 DIAGNOSIS — Z5112 Encounter for antineoplastic immunotherapy: Secondary | ICD-10-CM | POA: Diagnosis not present

## 2019-11-05 DIAGNOSIS — Z95828 Presence of other vascular implants and grafts: Secondary | ICD-10-CM

## 2019-11-05 LAB — CMP (CANCER CENTER ONLY)
ALT: 11 U/L (ref 0–44)
AST: 15 U/L (ref 15–41)
Albumin: 3.4 g/dL — ABNORMAL LOW (ref 3.5–5.0)
Alkaline Phosphatase: 69 U/L (ref 38–126)
Anion gap: 10 (ref 5–15)
BUN: 10 mg/dL (ref 6–20)
CO2: 24 mmol/L (ref 22–32)
Calcium: 8.9 mg/dL (ref 8.9–10.3)
Chloride: 107 mmol/L (ref 98–111)
Creatinine: 0.78 mg/dL (ref 0.44–1.00)
GFR, Est AFR Am: >60 mL/min (ref >60)
GFR, Estimated: >60 mL/min (ref >60)
Glucose, Bld: 104 mg/dL — ABNORMAL HIGH (ref 70–99)
Potassium: 3.8 mmol/L (ref 3.5–5.1)
Sodium: 141 mmol/L (ref 135–145)
Total Bilirubin: 0.3 mg/dL (ref 0.3–1.2)
Total Protein: 6.8 g/dL (ref 6.5–8.1)

## 2019-11-05 LAB — CBC WITH DIFFERENTIAL (CANCER CENTER ONLY)
Abs Immature Granulocytes: 0.01 10*3/uL (ref 0.00–0.07)
Basophils Absolute: 0 10*3/uL (ref 0.0–0.1)
Basophils Relative: 0 %
Eosinophils Absolute: 0 10*3/uL (ref 0.0–0.5)
Eosinophils Relative: 0 %
HCT: 31.7 % — ABNORMAL LOW (ref 36.0–46.0)
Hemoglobin: 10.2 g/dL — ABNORMAL LOW (ref 12.0–15.0)
Immature Granulocytes: 0 %
Lymphocytes Relative: 30 %
Lymphs Abs: 1.2 10*3/uL (ref 0.7–4.0)
MCH: 30 pg (ref 26.0–34.0)
MCHC: 32.2 g/dL (ref 30.0–36.0)
MCV: 93.2 fL (ref 80.0–100.0)
Monocytes Absolute: 0.4 10*3/uL (ref 0.1–1.0)
Monocytes Relative: 11 %
Neutro Abs: 2.2 10*3/uL (ref 1.7–7.7)
Neutrophils Relative %: 59 %
Platelet Count: 225 10*3/uL (ref 150–400)
RBC: 3.4 MIL/uL — ABNORMAL LOW (ref 3.87–5.11)
RDW: 14.9 % (ref 11.5–15.5)
WBC Count: 3.8 10*3/uL — ABNORMAL LOW (ref 4.0–10.5)
nRBC: 0 % (ref 0.0–0.2)

## 2019-11-05 LAB — TOTAL PROTEIN, URINE DIPSTICK: Protein, ur: 300 mg/dL — AB

## 2019-11-05 MED ORDER — SODIUM CHLORIDE 0.9 % IV SOLN
Freq: Once | INTRAVENOUS | Status: AC
  Filled 2019-11-05: qty 250

## 2019-11-05 MED ORDER — SODIUM CHLORIDE 0.9 % IV SOLN
8.0000 mg/kg | Freq: Once | INTRAVENOUS | Status: AC
  Administered 2019-11-05: 800 mg via INTRAVENOUS
  Filled 2019-11-05: qty 50

## 2019-11-05 MED ORDER — SODIUM CHLORIDE 0.9 % IV SOLN
10.0000 mg | Freq: Once | INTRAVENOUS | Status: AC
  Administered 2019-11-05: 10 mg via INTRAVENOUS
  Filled 2019-11-05: qty 10

## 2019-11-05 MED ORDER — SODIUM CHLORIDE 0.9% FLUSH
10.0000 mL | Freq: Once | INTRAVENOUS | Status: AC
  Administered 2019-11-05: 10 mL
  Filled 2019-11-05: qty 10

## 2019-11-05 MED ORDER — SODIUM CHLORIDE 0.9% FLUSH
10.0000 mL | INTRAVENOUS | Status: DC | PRN
  Administered 2019-11-05: 10 mL
  Filled 2019-11-05: qty 10

## 2019-11-05 MED ORDER — SODIUM CHLORIDE 0.9 % IV SOLN
60.0000 mg/m2 | Freq: Once | INTRAVENOUS | Status: AC
  Administered 2019-11-05: 126 mg via INTRAVENOUS
  Filled 2019-11-05: qty 21

## 2019-11-05 MED ORDER — HEPARIN SOD (PORK) LOCK FLUSH 100 UNIT/ML IV SOLN
500.0000 [IU] | Freq: Once | INTRAVENOUS | Status: AC | PRN
  Administered 2019-11-05: 500 [IU]
  Filled 2019-11-05: qty 5

## 2019-11-05 MED ORDER — DIPHENHYDRAMINE HCL 50 MG/ML IJ SOLN
25.0000 mg | Freq: Once | INTRAMUSCULAR | Status: AC
  Administered 2019-11-05: 25 mg via INTRAVENOUS

## 2019-11-05 MED ORDER — FAMOTIDINE IN NACL 20-0.9 MG/50ML-% IV SOLN
INTRAVENOUS | Status: AC
  Filled 2019-11-05: qty 50

## 2019-11-05 MED ORDER — FAMOTIDINE IN NACL 20-0.9 MG/50ML-% IV SOLN
20.0000 mg | Freq: Once | INTRAVENOUS | Status: AC
  Administered 2019-11-05: 20 mg via INTRAVENOUS

## 2019-11-05 MED ORDER — DIPHENHYDRAMINE HCL 50 MG/ML IJ SOLN
INTRAMUSCULAR | Status: AC
  Filled 2019-11-05: qty 1

## 2019-11-05 NOTE — Patient Instructions (Signed)
[facility] Discharge Instructions for Patients Receiving Chemotherapy  Today you received the following chemotherapy agents: Cyramza/Taxol.  To help prevent nausea and vomiting after your treatment, we encourage you to take your nausea medication as directed.   If you develop nausea and vomiting that is not controlled by your nausea medication, call the clinic.   BELOW ARE SYMPTOMS THAT SHOULD BE REPORTED IMMEDIATELY:  *FEVER GREATER THAN 100.5 F  *CHILLS WITH OR WITHOUT FEVER  NAUSEA AND VOMITING THAT IS NOT CONTROLLED WITH YOUR NAUSEA MEDICATION  *UNUSUAL SHORTNESS OF BREATH  *UNUSUAL BRUISING OR BLEEDING  TENDERNESS IN MOUTH AND THROAT WITH OR WITHOUT PRESENCE OF ULCERS  *URINARY PROBLEMS  *BOWEL PROBLEMS  UNUSUAL RASH Items with * indicate a potential emergency and should be followed up as soon as possible.  Feel free to call the clinic should you have any questions or concerns. The clinic phone number is [phone].  Please show the [facility] at check-in to the Emergency Department and triage nurse.

## 2019-11-05 NOTE — Progress Notes (Signed)
[facility] OFFICE PROGRESS NOTE   Diagnosis: Gastric cancer  INTERVAL HISTORY:   [name] returns as scheduled.  She completed another treatment with Taxol/ramucirumab on 10/22/2019.  She reports stable peripheral numbness.  This predated Taxol.  She has intermittent gross hematuria.  She continues to have intermittent vomiting, but this has improved since beginning the Taxol/ramucirumab.  Objective:  Vital signs in last 24 hours:  Blood pressure 108/75, pulse 88, temperature 97.6 F (36.4 C), temperature source Temporal, resp. rate 17, height _0  (1.626 m), weight 206 lb 14.4 oz (93.8 kg), SpO2 100 %.    HEENT: No thrush or ulcers Resp: Lungs clear bilaterally Cardio: Regular rate and rhythm GI: Nontender, no mass, no hepatomegaly Vascular: No leg edema   Portacath/PICC-without erythema  Lab Results:  Lab Results  Component Value Date   WBC 3.8 (L) 11/05/2019   HGB 10.2 (L) 11/05/2019   HCT 31.7 (L) 11/05/2019   MCV 93.2 11/05/2019   PLT 225 11/05/2019   NEUTROABS 2.2 11/05/2019    CMP  Lab Results  Component Value Date   NA 138 10/22/2019   K 3.9 10/22/2019   CL 106 10/22/2019   CO2 24 10/22/2019   GLUCOSE 82 10/22/2019   BUN 13 10/22/2019   CREATININE 0.89 10/22/2019   CALCIUM 8.7 (L) 10/22/2019   PROT 6.6 10/22/2019   ALBUMIN 3.4 (L) 10/22/2019   AST 16 10/22/2019   ALT 11 10/22/2019   ALKPHOS 67 10/22/2019   BILITOT 0.4 10/22/2019   GFRNONAA >60 10/22/2019   GFRAA >60 10/22/2019    Lab Results  Component Value Date   CEA1 3.45 09/04/2019    Medications: I have reviewed the patient's current medications.   Assessment/Plan: 1. Gastric cancer ? Presenting with dysphagia spring 2019 ? 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 CTs revealed wall thickening and luminal narrowing of the colon at the hepatic flexure and cecum ? PET scan with no evidence of distant metastatic disease or abnormal uptake other than thickening of the distal esophagus and proximal stomach ? 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 CTs 10/23/2017-diffuse prominence of the gastric wall, especially the antrum, focal wall thickening of the distal ascending colon and hepatic flexure, thickening of the cecum, nonspecific haziness of the mesenteric fat in the pelvis, mild prominence of lymph nodes at the greater curvature of the stomach ? Treatment with FOLFOX beginning July 2019 ? CTs October 2019-stable disease, FOLFOX continued ? CTs December 2019-stable disease ? January 2020 treatment transition to Xeloda maintenance, care transition to [place] ? CTs in June 2020 in September 2020-stable disease, Xeloda continued ? CTs 06/23/2019-nonspecific thickening of the GE junction, intraluminal bladder mass ? Cystoscopy-metastatic signet ring cell adenocarcinoma ? Cycle 1 day 1 ramucirumab/paclitaxel 08/19/2019(given atanother facility) ? Cycle 1 day 1 ramucirumab/paclitaxel 09/04/2019 ? Cycle 1 day 8 Taxol 09/10/19 ? Cycle 1 day 15 ramucirumab/paclitaxel 6/2/2021canceled d/t neutropenia ? Ramucirumab/pactlitaxel every 2 weeks 09/24/2019 ? Ramucirumab/paclitaxel 10/08/2019 ? Ramucirumab/paclitaxel 10/22/2019  2.Left breast cancer 2008,pT1c,pN0, status post a left lumpectomy with adjuvant  chemotherapy and radiation, ER positive, PR positive, HER-2 positive  3.Mixed connective tissue disease/SLE  4.Lower extremity deep vein thrombosis maintained on apixaban  5.Family history of multiple cancers including breast and ovarian cancer  6.Dysphagia and intermittent vomiting secondary to #1  7.Hypertension  8.Peripheral neuropathy  9.Masslike fullness at the posterior right parotid/angle of the jaw  10. Dyspnea on exertion, ongoing for about a month, worsened after taxol/ramucirumab on 09/04/19 requiring ED visit on 5/23. CBC, CMP, troponin, BNP and chest xray negative  11.Mucositis secondary to chemotherapy 09/17/2019   Disposition: [name] appears unchanged.  She will complete another treatment with Taxol/ramucirumab today.  She will undergo restaging CTs after this cycle.  She would like to delay the next cycle of chemotherapy due to a planned family reunion.  She has proteinuria on the urine dipstick again today.  This is likely secondary to hematuria.  Urine culture was negative on 10/22/2019.  I suspect the hematuria is related to the tumor involving the bladder while on anticoagulation therapy.  We will consider referring her for a cystoscopy if she has increased hematuria.  She will return for an office visit on 11/26/2019.  Betsy Coder, MD  11/05/2019  10:28 AM

## 2019-11-05 NOTE — Progress Notes (Signed)
Per Dr. Benay Spice: OK to treat w/urine protein 300. 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

## 2019-11-05 NOTE — Progress Notes (Signed)
Brief nutrition follow-up completed with patient during infusion for gastric cancer.  Patient is tired and wants to sleep. Patient reports she has intermittent vomiting. She states constipation is controlled. Denies problems with her appetite. Weight documented is 206.9 pounds July 21, decreased from 209.3 pounds July 7.  Nutrition diagnosis: Unintended weight loss continues.  Intervention: Provided brief education on the importance of minimizing weight loss. Encouraged adequate calorie and protein intake.  Monitoring, evaluation, goals: Patient will achieve weight stabilization with adequate calories and protein.  Next visit: We will continue to follow patient as needed.  She has my contact information for questions.  Please refer back to RD if nutrition needs are identified.  **Disclaimer: This note was dictated with voice recognition software. Similar sounding words can inadvertently be transcribed and this note may contain transcription errors which may not have been corrected upon publication of note.**

## 2019-11-05 NOTE — Patient Instructions (Signed)

## 2019-11-06 ENCOUNTER — Telehealth: Payer: Self-pay | Admitting: Oncology

## 2019-11-06 NOTE — Telephone Encounter (Signed)
Scheduled appts per 7/21 los. Pt confirmed appt date and time.  

## 2019-11-19 ENCOUNTER — Ambulatory Visit: Payer: Medicare Other

## 2019-11-19 ENCOUNTER — Other Ambulatory Visit: Payer: Medicare Other

## 2019-11-19 ENCOUNTER — Ambulatory Visit: Payer: Medicare Other | Admitting: Nurse Practitioner

## 2019-11-20 ENCOUNTER — Ambulatory Visit (HOSPITAL_COMMUNITY): Payer: Medicare Other

## 2019-11-21 ENCOUNTER — Other Ambulatory Visit: Payer: Self-pay | Admitting: Oncology

## 2019-11-24 ENCOUNTER — Encounter (HOSPITAL_COMMUNITY): Payer: Self-pay

## 2019-11-24 ENCOUNTER — Other Ambulatory Visit: Payer: Self-pay

## 2019-11-24 ENCOUNTER — Ambulatory Visit (HOSPITAL_COMMUNITY)
Admission: RE | Admit: 2019-11-24 | Discharge: 2019-11-24 | Disposition: A | Discharge status: Home / Self Care | Payer: Medicare Other | Source: Ambulatory Visit | Attending: Oncology | Admitting: Oncology

## 2019-11-24 DIAGNOSIS — C169 Malignant neoplasm of stomach, unspecified: Secondary | ICD-10-CM | POA: Insufficient documentation

## 2019-11-24 HISTORY — DX: Malignant neoplasm of unspecified site of unspecified female breast: C50.919

## 2019-11-24 MED ORDER — HEPARIN SOD (PORK) LOCK FLUSH 100 UNIT/ML IV SOLN
INTRAVENOUS | Status: AC
  Filled 2019-11-24: qty 5

## 2019-11-24 MED ORDER — IOHEXOL 300 MG/ML  SOLN
100.0000 mL | Freq: Once | INTRAMUSCULAR | Status: AC | PRN
  Administered 2019-11-24: 100 mL via INTRAVENOUS

## 2019-11-24 MED ORDER — HEPARIN SOD (PORK) LOCK FLUSH 100 UNIT/ML IV SOLN
500.0000 [IU] | Freq: Once | INTRAVENOUS | Status: AC
  Administered 2019-11-24: 500 [IU] via INTRAVENOUS

## 2019-11-24 MED ORDER — SODIUM CHLORIDE (PF) 0.9 % IJ SOLN
INTRAMUSCULAR | Status: AC
  Filled 2019-11-24: qty 50

## 2019-11-25 ENCOUNTER — Inpatient Hospital Stay (HOSPITAL_BASED_OUTPATIENT_CLINIC_OR_DEPARTMENT_OTHER): Payer: Medicare Other | Admitting: Oncology

## 2019-11-25 ENCOUNTER — Inpatient Hospital Stay: Payer: Medicare Other

## 2019-11-25 ENCOUNTER — Inpatient Hospital Stay: Payer: Medicare Other | Attending: Oncology

## 2019-11-25 ENCOUNTER — Other Ambulatory Visit: Payer: Self-pay

## 2019-11-25 VITALS — BP 115/77 | HR 83 | Temp 95.4°F | Resp 17 | Ht 64.0 in | Wt 204.7 lb

## 2019-11-25 DIAGNOSIS — R319 Hematuria, unspecified: Secondary | ICD-10-CM | POA: Diagnosis not present

## 2019-11-25 DIAGNOSIS — K1231 Oral mucositis (ulcerative) due to antineoplastic therapy: Secondary | ICD-10-CM | POA: Diagnosis not present

## 2019-11-25 DIAGNOSIS — Z5112 Encounter for antineoplastic immunotherapy: Secondary | ICD-10-CM | POA: Diagnosis present

## 2019-11-25 DIAGNOSIS — I10 Essential (primary) hypertension: Secondary | ICD-10-CM | POA: Insufficient documentation

## 2019-11-25 DIAGNOSIS — C169 Malignant neoplasm of stomach, unspecified: Secondary | ICD-10-CM

## 2019-11-25 DIAGNOSIS — Z853 Personal history of malignant neoplasm of breast: Secondary | ICD-10-CM | POA: Insufficient documentation

## 2019-11-25 DIAGNOSIS — Z5111 Encounter for antineoplastic chemotherapy: Secondary | ICD-10-CM | POA: Diagnosis present

## 2019-11-25 DIAGNOSIS — Z95828 Presence of other vascular implants and grafts: Secondary | ICD-10-CM

## 2019-11-25 DIAGNOSIS — G629 Polyneuropathy, unspecified: Secondary | ICD-10-CM | POA: Diagnosis not present

## 2019-11-25 DIAGNOSIS — R809 Proteinuria, unspecified: Secondary | ICD-10-CM

## 2019-11-25 DIAGNOSIS — R112 Nausea with vomiting, unspecified: Secondary | ICD-10-CM | POA: Insufficient documentation

## 2019-11-25 LAB — CBC WITH DIFFERENTIAL (CANCER CENTER ONLY)
Abs Immature Granulocytes: 0.01 10*3/uL (ref 0.00–0.07)
Basophils Absolute: 0 10*3/uL (ref 0.0–0.1)
Basophils Relative: 0 %
Eosinophils Absolute: 0 10*3/uL (ref 0.0–0.5)
Eosinophils Relative: 1 %
HCT: 33.4 % — ABNORMAL LOW (ref 36.0–46.0)
Hemoglobin: 10.5 g/dL — ABNORMAL LOW (ref 12.0–15.0)
Immature Granulocytes: 0 %
Lymphocytes Relative: 37 %
Lymphs Abs: 1.5 10*3/uL (ref 0.7–4.0)
MCH: 29.1 pg (ref 26.0–34.0)
MCHC: 31.4 g/dL (ref 30.0–36.0)
MCV: 92.5 fL (ref 80.0–100.0)
Monocytes Absolute: 0.6 10*3/uL (ref 0.1–1.0)
Monocytes Relative: 16 %
Neutro Abs: 1.8 10*3/uL (ref 1.7–7.7)
Neutrophils Relative %: 46 %
Platelet Count: 275 10*3/uL (ref 150–400)
RBC: 3.61 MIL/uL — ABNORMAL LOW (ref 3.87–5.11)
RDW: 16.1 % — ABNORMAL HIGH (ref 11.5–15.5)
WBC Count: 4 10*3/uL (ref 4.0–10.5)
nRBC: 0 % (ref 0.0–0.2)

## 2019-11-25 LAB — CMP (CANCER CENTER ONLY)
ALT: 7 U/L (ref 0–44)
AST: 15 U/L (ref 15–41)
Albumin: 3.4 g/dL — ABNORMAL LOW (ref 3.5–5.0)
Alkaline Phosphatase: 71 U/L (ref 38–126)
Anion gap: 8 (ref 5–15)
BUN: 9 mg/dL (ref 6–20)
CO2: 25 mmol/L (ref 22–32)
Calcium: 9.2 mg/dL (ref 8.9–10.3)
Chloride: 107 mmol/L (ref 98–111)
Creatinine: 0.81 mg/dL (ref 0.44–1.00)
GFR, Est AFR Am: >60 mL/min (ref >60)
GFR, Estimated: >60 mL/min (ref >60)
Glucose, Bld: 87 mg/dL (ref 70–99)
Potassium: 4 mmol/L (ref 3.5–5.1)
Sodium: 140 mmol/L (ref 135–145)
Total Bilirubin: 0.3 mg/dL (ref 0.3–1.2)
Total Protein: 6.7 g/dL (ref 6.5–8.1)

## 2019-11-25 LAB — TOTAL PROTEIN, URINE DIPSTICK: Protein, ur: 100 mg/dL — AB

## 2019-11-25 MED ORDER — SODIUM CHLORIDE 0.9 % IV SOLN
60.0000 mg/m2 | Freq: Once | INTRAVENOUS | Status: AC
  Administered 2019-11-25: 126 mg via INTRAVENOUS
  Filled 2019-11-25: qty 21

## 2019-11-25 MED ORDER — SODIUM CHLORIDE 0.9 % IV SOLN
Freq: Once | INTRAVENOUS | Status: AC
  Filled 2019-11-25: qty 250

## 2019-11-25 MED ORDER — DIPHENHYDRAMINE HCL 50 MG/ML IJ SOLN
INTRAMUSCULAR | Status: AC
  Filled 2019-11-25: qty 1

## 2019-11-25 MED ORDER — SODIUM CHLORIDE 0.9 % IV SOLN
10.0000 mg | Freq: Once | INTRAVENOUS | Status: AC
  Administered 2019-11-25: 10 mg via INTRAVENOUS
  Filled 2019-11-25: qty 10

## 2019-11-25 MED ORDER — DIPHENHYDRAMINE HCL 50 MG/ML IJ SOLN
25.0000 mg | Freq: Once | INTRAMUSCULAR | Status: AC
  Administered 2019-11-25: 25 mg via INTRAVENOUS

## 2019-11-25 MED ORDER — FAMOTIDINE IN NACL 20-0.9 MG/50ML-% IV SOLN
20.0000 mg | Freq: Once | INTRAVENOUS | Status: AC
  Administered 2019-11-25: 20 mg via INTRAVENOUS

## 2019-11-25 MED ORDER — SODIUM CHLORIDE 0.9% FLUSH
10.0000 mL | INTRAVENOUS | Status: DC | PRN
  Administered 2019-11-25: 10 mL
  Filled 2019-11-25: qty 10

## 2019-11-25 MED ORDER — HEPARIN SOD (PORK) LOCK FLUSH 100 UNIT/ML IV SOLN
500.0000 [IU] | Freq: Once | INTRAVENOUS | Status: AC | PRN
  Administered 2019-11-25: 500 [IU]
  Filled 2019-11-25: qty 5

## 2019-11-25 MED ORDER — SODIUM CHLORIDE 0.9% FLUSH
10.0000 mL | Freq: Once | INTRAVENOUS | Status: AC
  Administered 2019-11-25: 10 mL
  Filled 2019-11-25: qty 10

## 2019-11-25 MED ORDER — SODIUM CHLORIDE 0.9 % IV SOLN
8.0000 mg/kg | Freq: Once | INTRAVENOUS | Status: AC
  Administered 2019-11-25: 800 mg via INTRAVENOUS
  Filled 2019-11-25: qty 50

## 2019-11-25 MED ORDER — FAMOTIDINE IN NACL 20-0.9 MG/50ML-% IV SOLN
INTRAVENOUS | Status: AC
  Filled 2019-11-25: qty 50

## 2019-11-25 NOTE — Progress Notes (Signed)
Nutrition Follow-up:  Patient with gastric cancer, receiving chemotherapy.    Met with patient during infusion. Patient reports that appetite is pretty good.  Reports eats oatmeal for breakfast, [place] and cheese sandwich for lunch.  Dinner is usually meat, vegetable and starch.  Does not drink oral nutrition supplements.  Has tried boost.  Reports that nausea is some better.      Medications: reviewed  Labs: reviewed  Anthropometrics:   Weight 204 lb 11.2 oz today decreased from 206 lb 9 oz on 7/21 209 lb 3 oz on 7/7   NUTRITION DIAGNOSIS: Unintentional weight loss continues   INTERVENTION:  Discussed ways to add more calories and protein into diet to prevent further weight loss.  Handout provided.     MONITORING, EVALUATION, GOAL: weight trends, intake   NEXT VISIT: as needed  Marin Wisner B. Zenia Resides, [place], [place] Registered Dietitian [phone] (mobile)

## 2019-11-25 NOTE — Progress Notes (Signed)
Per Dr. Benay Spice: OK to treat w/urine protein 100. 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

## 2019-11-25 NOTE — Progress Notes (Signed)
[facility] OFFICE PROGRESS NOTE   Diagnosis: Gastric cancer  INTERVAL HISTORY:   [name] on 11/05/2019.  She has intermittent nausea and vomiting, but she does not vomit daily.  She is having bowel movements.  She has intermittent hematuria.  Stable numbness in the hands and feet.  This does not interfere with activity.  She takes pain medication for "lupus "pain.  She recently returned from a family vacation.    Objective:  Vital signs in last 24 hours:  Blood pressure 115/77, pulse 83, temperature (!) 95.4 F (35.2 C), temperature source Tympanic, resp. rate 17, height _0  (1.626 m), weight 204 lb 11.2 oz (92.9 kg), SpO2 100 %.    Resp: Lungs clear bilaterally Cardio: Regular rate and rhythm GI: No mass, nontender, no hepatosplenomegaly  Vascular: No leg edema  Skin: Mild hyperpigmentation and dryness of the hands  Portacath/PICC-without erythema  Lab Results:  Lab Results  Component Value Date   WBC 4.0 11/25/2019   HGB 10.5 (L) 11/25/2019   HCT 33.4 (L) 11/25/2019   MCV 92.5 11/25/2019   PLT 275 11/25/2019   NEUTROABS 1.8 11/25/2019    CMP  Lab Results  Component Value Date   NA 141 11/05/2019   K 3.8 11/05/2019   CL 107 11/05/2019   CO2 24 11/05/2019   GLUCOSE 104 (H) 11/05/2019   BUN 10 11/05/2019   CREATININE 0.78 11/05/2019   CALCIUM 8.9 11/05/2019   PROT 6.8 11/05/2019   ALBUMIN 3.4 (L) 11/05/2019   AST 15 11/05/2019   ALT 11 11/05/2019   ALKPHOS 69 11/05/2019   BILITOT 0.3 11/05/2019   GFRNONAA >60 11/05/2019   GFRAA >60 11/05/2019    Lab Results  Component Value Date   CEA1 3.45 09/04/2019     Imaging:  CT CHEST W CONTRAST  Result Date: 11/24/2019 CLINICAL DATA:  Metastatic gastric cancer restaging, 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 CONTRAST:  132m OMNIPAQUE IOHEXOL 300 MG/ML  SOLN COMPARISON:  Outside CT, 06/23/2019, 03/21/2018 FINDINGS: CT CHEST FINDINGS Cardiovascular: Left chest port catheter. Normal heart size. No pericardial effusion. Mediastinum/Nodes: No enlarged mediastinal, hilar, or axillary lymph nodes. Status post left thyroidectomy. Unchanged thickening of the lower esophagus (series 2, image 39). The upper esophagus is fluid-filled. Trachea demonstrates no significant findings. Lungs/Pleura: Lungs are clear. No pleural effusion or pneumothorax. Musculoskeletal: No chest wall mass or suspicious bone lesions identified. CT ABDOMEN PELVIS FINDINGS Hepatobiliary: No focal liver abnormality is seen. Status post cholecystectomy. No biliary dilatation. Pancreas: Unremarkable. No pancreatic ductal dilatation or surrounding inflammatory changes. Spleen: Normal in size without significant abnormality. Adrenals/Urinary Tract: Adrenal glands are unremarkable. Kidneys are normal, without renal calculi, solid lesion, or hydronephrosis. Unchanged eccentric thickening of the superior and left aspect of the urinary bladder, measuring up to 1.4 cm in thickness Stomach/Bowel: Unchanged diffuse thickening of the stomach wall near the gastroesophageal junction and along the gastric body and antrum (series 2, image 50). Appendix appears normal. Unchanged soft tissue thickening of the transverse colon about the hepatic flexure (series 5, image 31). Vascular/Lymphatic: No significant vascular findings are present. No enlarged abdominal or pelvic lymph nodes. Reproductive: No mass or other abnormality. Other: No abdominal wall hernia or abnormality. No abdominopelvic ascites. Musculoskeletal: No acute or significant osseous  findings. IMPRESSION: 1. Unchanged diffuse thickening of the distal esophagus, stomach wall near the  gastroesophageal junction and along the gastric body and antrum, consistent with infiltrative primary gastric malignancy. 2. Unchanged soft tissue thickening of the transverse colon about the hepatic flexure, consistent with post treatment appearance of biopsy proven metastatic disease. 3. Unchanged eccentric soft tissue thickening of the urinary bladder, in keeping with proven metastatic disease. 4. No evidence of new metastatic disease in the chest, abdomen, or pelvis. 5. The upper esophagus is fluid-filled, suggesting some degree of functional obstipation. This may place the patient at risk of aspiration. Electronically Signed   By: Eddie Candle M.D.   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 CONTRAST:  131m OMNIPAQUE IOHEXOL 300 MG/ML  SOLN COMPARISON:  Outside CT, 06/23/2019, 03/21/2018 FINDINGS: CT CHEST FINDINGS Cardiovascular: Left chest port catheter. Normal heart size. No pericardial effusion. Mediastinum/Nodes: No enlarged mediastinal, hilar, or axillary lymph nodes. Status post left thyroidectomy. Unchanged thickening of the lower esophagus (series 2, image 39). The upper esophagus is fluid-filled. Trachea demonstrates no significant findings. Lungs/Pleura: Lungs are clear. No pleural effusion or pneumothorax. Musculoskeletal: No chest wall mass or suspicious bone lesions identified. CT ABDOMEN PELVIS FINDINGS Hepatobiliary: No focal liver abnormality is seen. Status post cholecystectomy. No biliary dilatation. Pancreas: Unremarkable. No pancreatic ductal dilatation or surrounding  inflammatory changes. Spleen: Normal in size without significant abnormality. Adrenals/Urinary Tract: Adrenal glands are unremarkable. Kidneys are normal, without renal calculi, solid lesion, or hydronephrosis. Unchanged eccentric thickening of the superior and left aspect of the urinary bladder, measuring up to 1.4 cm in thickness Stomach/Bowel: Unchanged diffuse thickening of the stomach wall near the gastroesophageal junction and along the gastric body and antrum (series 2, image 50). Appendix appears normal. Unchanged soft tissue thickening of the transverse colon about the hepatic flexure (series 5, image 31). Vascular/Lymphatic: No significant vascular findings are present. No enlarged abdominal or pelvic lymph nodes. Reproductive: No mass or other abnormality. Other: No abdominal wall hernia or abnormality. No abdominopelvic ascites. Musculoskeletal: No acute or significant osseous findings. IMPRESSION: 1. Unchanged diffuse thickening of the distal esophagus, stomach wall near the gastroesophageal junction and along the gastric body and antrum, consistent with infiltrative primary gastric malignancy. 2. Unchanged soft tissue thickening of the transverse colon about the hepatic flexure, consistent with post treatment appearance of biopsy proven metastatic disease. 3. Unchanged eccentric soft tissue thickening of the urinary bladder, in keeping with proven metastatic disease. 4. No evidence of new metastatic disease in the chest, abdomen, or pelvis. 5. The upper esophagus is fluid-filled, suggesting some degree of functional obstipation. This may place the patient at risk of aspiration. Electronically Signed   By: AEddie CandleM.D.   On: 11/24/2019 10:24    Medications: I have reviewed the patient's current medications.   Assessment/Plan: 1. Gastric cancer ? Presenting with dysphagia spring 2019 ? 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 CTs revealed wall thickening and  luminal narrowing of the colon at the hepatic flexure and cecum ? PET scan with no evidence of distant metastatic disease or abnormal uptake other than thickening of the distal esophagus and proximal stomach ? 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 CTs 10/23/2017-diffuse prominence of the gastric wall, especially the antrum, focal wall thickening of the distal ascending colon and hepatic flexure, thickening of the cecum, nonspecific haziness of the mesenteric fat in the pelvis, mild prominence of lymph nodes at the greater curvature of the stomach ? Treatment with FOLFOX beginning July 2019 ? CTs October 2019-stable disease, FOLFOX continued ? CTs December 2019-stable disease ? January 2020 treatment transition to Xeloda maintenance, care transition to [facility] ? CTs in June 2020 in September 2020-stable disease, Xeloda continued ? CTs 06/23/2019-nonspecific thickening of the GE junction, intraluminal bladder mass ? Cystoscopy-metastatic signet ring cell adenocarcinoma ? Cycle 1 day 1 ramucirumab/paclitaxel 08/19/2019(given atanother facility) ? Cycle 1 day 1 ramucirumab/paclitaxel 09/04/2019 ? Cycle 1 day 8 Taxol 09/10/19 ? Cycle 1 day 15 ramucirumab/paclitaxel 6/2/2021canceled d/t neutropenia ? Ramucirumab/pactlitaxel every 2 weeks 09/24/2019 ? Ramucirumab/paclitaxel  10/08/2019 ? Ramucirumab/paclitaxel 10/22/2019 ? Ramucirumab/paclitaxel 11/05/2019 ? CTs 11/24/2019-unchanged thickening of the distal esophagus/upper stomach, gastric body and antrum.  Unchanged thickening of the transverse colon at the hepatic flexure, unchanged thickening of the urinary bladder, no evidence of progressive disease ? Ramucirumab/paclitaxel 11/25/2019  2.Left breast cancer 2008,pT1c,pN0, status post a left lumpectomy with adjuvant chemotherapy and radiation, ER positive, PR positive, HER-2 positive  3.Mixed connective tissue disease/SLE  4.Lower extremity deep vein thrombosis maintained on apixaban  5.Family history of multiple cancers including breast and ovarian cancer  6.Dysphagia and intermittent vomiting secondary to #1  7.Hypertension  8.Peripheral neuropathy  9.Masslike fullness at the posterior right parotid/angle of the jaw  10. Dyspnea on exertion, ongoing for about a month, worsened after taxol/ramucirumab on 09/04/19 requiring ED visit on 5/23. CBC, CMP, troponin, BNP and chest xray negative  11.Mucositis secondary to chemotherapy 09/17/2019     Disposition: [name] appears unchanged.  The restaging CTs reveal stable disease with no evidence of new metastatic disease.  She has persistent intermittent nausea, but this has improved since beginning the current treatment regimen.  The intermittent hematuria and proteinuria are likely related to tumor involving the bladder.  I reviewed the CT images with [name] and her mother.  I recommend continuing the current treatment.  She will call for increased bleeding.  [name] will return for an office visit and chemotherapy in 2 weeks.  I counseled her on the benefit of the COVID-19 vaccine.  She declines the vaccine for now.  Betsy Coder, MD  11/25/2019  11:07 AM

## 2019-11-25 NOTE — Patient Instructions (Signed)
[facility] Discharge Instructions for Patients Receiving Chemotherapy  Today you received the following chemotherapy agents: Cyramza, Taxol  To help prevent nausea and vomiting after your treatment, we encourage you to take your nausea medication as directed.   If you develop nausea and vomiting that is not controlled by your nausea medication, call the clinic.   BELOW ARE SYMPTOMS THAT SHOULD BE REPORTED IMMEDIATELY:  *FEVER GREATER THAN 100.5 F  *CHILLS WITH OR WITHOUT FEVER  NAUSEA AND VOMITING THAT IS NOT CONTROLLED WITH YOUR NAUSEA MEDICATION  *UNUSUAL SHORTNESS OF BREATH  *UNUSUAL BRUISING OR BLEEDING  TENDERNESS IN MOUTH AND THROAT WITH OR WITHOUT PRESENCE OF ULCERS  *URINARY PROBLEMS  *BOWEL PROBLEMS  UNUSUAL RASH Items with * indicate a potential emergency and should be followed up as soon as possible.  Feel free to call the clinic should you have any questions or concerns. The clinic phone number is [phone].  Please show the CHEMO ALERT CARD at check-in to the Emergency Department and triage nurse.   

## 2019-11-26 ENCOUNTER — Telehealth: Payer: Self-pay | Admitting: Oncology

## 2019-11-26 NOTE — Telephone Encounter (Signed)
Scheduled appointments per 8/10 los. Patient is aware of appointment dates and times.

## 2019-11-27 ENCOUNTER — Ambulatory Visit (HOSPITAL_COMMUNITY): Payer: Medicare Other

## 2019-12-06 ENCOUNTER — Other Ambulatory Visit: Payer: Self-pay | Admitting: Oncology

## 2019-12-07 NOTE — Progress Notes (Signed)
[facility]   Telephone:([phone] Fax:([phone]) [phone]   Clinic Follow up Note   Patient Care Team: Laurette Schimke, DO as PCP - General (Internal Medicine) Jonnie Finner, RN as Oncology Nurse Navigator Ladell Pier, MD as Consulting Physician (Oncology) 12/09/2019  CHIEF COMPLAINT: F/u gastric cancer   CURRENT THERAPY: Michelle Cain returns for f/u and treatment as scheduled. She completed cycle 3 Taxol/ramucirumab on 11/05/19. She began Cycle 4 day 1 on 8/10.   INTERVAL HISTORY: Michelle Cain returns for follow-up and treatment as scheduled.  She continues to have intermittent vomiting after meals but not as much.  She has normal BMs.  She continues to have painless hematuria which is stable for her.  Energy is adequate, remains active and out of bed.  Denies mucositis, fever, chills, cough, chest pain, new pain, increased exertional dyspnea, or worsening neuropathy.  She is able to function well.Marland Kitchen   MEDICAL HISTORY:  Past Medical History:  Diagnosis Date  . Breast CA ([place]) dx'd 2007   left  . Lupus ([place])   . Raynaud disease   . stomach/colon ca dx'd 2020    SURGICAL HISTORY: Past Surgical History:  Procedure Laterality Date  . BREAST SURGERY    . CHOLECYSTECTOMY      I have reviewed the social history and family history with the patient and they are unchanged from previous note.  ALLERGIES:  has No Known Allergies.  MEDICATIONS:  Current Outpatient Medications  Medication Sig Dispense Refill  . amLODipine (NORVASC) 5 MG tablet Take 5 mg by mouth daily.    Marland Kitchen apixaban (ELIQUIS) 5 MG TABS tablet Take 5 mg by mouth in the morning and at bedtime.    Marland Kitchen aspirin EC 81 MG tablet Take 81 mg by mouth daily.    Marland Kitchen azaTHIOprine (IMURAN) 50 MG tablet Take 50 mg by mouth 2 (two) times daily.    . Cholecalciferol 50 MCG (2000 UT) CAPS Take 2,000 Units by mouth daily.    . cyclobenzaprine (FLEXERIL) 10 MG tablet Take 1 tablet by mouth 3 (three) times  daily as needed.    . docusate sodium (COLACE) 100 MG capsule Take 100 mg by mouth at bedtime.    . furosemide (LASIX) 20 MG tablet Take 20 mg by mouth daily as needed for fluid or edema.  (Patient not taking: Reported on 10/08/2019)    . gabapentin (NEURONTIN) 300 MG capsule Take 300 mg by mouth in the morning and at bedtime.    Marland Kitchen HYDROcodone-acetaminophen (NORCO) 10-325 MG tablet Take 1 tablet by mouth every 6 (six) hours as needed for moderate pain or severe pain.     . hydroxychloroquine (PLAQUENIL) 200 MG tablet Take 200 mg by mouth 2 (two) times daily.    Marland Kitchen loperamide (IMODIUM) 2 MG capsule Take 2 mg by mouth as needed for diarrhea or loose stools.  (Patient not taking: Reported on 10/08/2019)    . magic mouthwash SOLN Take 5-10 mLs by mouth 4 (four) times daily as needed for mouth pain. Swish and spit 400 mL 1  . meloxicam (MOBIC) 15 MG tablet Take 1 tablet (15 mg total) by mouth daily. 30 tablet 1  . metoCLOPramide (REGLAN) 10 MG tablet Take 10 mg by mouth 3 (three) times daily before meals.    Marland Kitchen omeprazole (PRILOSEC) 20 MG capsule Take 20 mg by mouth daily.    . ondansetron (ZOFRAN) 8 MG tablet Take 8 mg by mouth every 8 (eight) hours as needed for  nausea or vomiting.     . Polyethylene Glycol 3350 (MIRALAX PO) Take 17 g by mouth in the morning and at bedtime.    . potassium chloride (KLOR-CON) 10 MEQ tablet Take 10 mEq by mouth daily.    . prochlorperazine (COMPAZINE) 10 MG tablet Take 10 mg by mouth every 6 (six) hours as needed for nausea or vomiting.     . traMADol (ULTRAM) 50 MG tablet Take 1 tablet (50 mg total) by mouth every 8 (eight) hours as needed for pain. 90 tablet 1   No current facility-administered medications for this visit.   Facility-Administered Medications Ordered in Other Visits  Medication Dose Route Frequency Provider Last Rate Last Admin  . heparin lock flush 100 unit/mL  500 Units Intracatheter Once PRN Ladell Pier, MD      . PACLitaxel (TAXOL) 126 mg in  sodium chloride 0.9 % 250 mL chemo infusion (</= 30m/m2)  60 mg/m2 (Treatment Plan Recorded) Intravenous Once SLadell Pier MD      . ramucirumab (CYRAMZA) 800 mg in sodium chloride 0.9 % 170 mL chemo infusion  8 mg/kg (Treatment Plan Recorded) Intravenous Once SLadell Pier MD      . sodium chloride flush (NS) 0.9 % injection 10 mL  10 mL Intravenous PRN SLadell Pier MD   10 mL at 09/17/19 1109  . sodium chloride flush (NS) 0.9 % injection 10 mL  10 mL Intracatheter PRN SLadell Pier MD        PHYSICAL EXAMINATION: ECOG PERFORMANCE STATUS: 1 - Symptomatic but completely ambulatory  Vitals:   12/09/19 1159  BP: 120/74  Pulse: 71  Resp: 18  Temp: 97.8 F (36.6 C)  SpO2: 100%   Filed Weights   12/09/19 1159  Weight: 206 lb 6.4 oz (93.6 kg)    GENERAL:alert, no distress and comfortable SKIN: No rash to exposed skin EYES:  sclera clear OROPHARYNX: No thrush or ulcers LUNGS:  normal breathing effort HEART: no lower extremity edema NEURO: alert & oriented x 3 with fluent speech, intact peripheral vibratory sense over the fingertips per tuning fork exam PAC without erythema  LABORATORY DATA:  I have reviewed the data as listed CBC Latest Ref Rng & Units 12/09/2019 11/25/2019 11/05/2019  WBC 4.0 - 10.5 K/uL 3.6(L) 4.0 3.8(L)  Hemoglobin 12.0 - 15.0 g/dL 10.1(L) 10.5(L) 10.2(L)  Hematocrit 36 - 46 % 31.7(L) 33.4(L) 31.7(L)  Platelets 150 - 400 K/uL 258 275 225     CMP Latest Ref Rng & Units 12/09/2019 11/25/2019 11/05/2019  Glucose 70 - 99 mg/dL 85 87 104(H)  BUN 6 - 20 mg/dL _0 Creatinine 0.44 - 1.00 mg/dL 0.84 0.81 0.78  Sodium 135 - 145 mmol/L 140 140 141  Potassium 3.5 - 5.1 mmol/L 4.0 4.0 3.8  Chloride 98 - 111 mmol/L 107 107 107  CO2 22 - 32 mmol/L _1 Calcium 8.9 - 10.3 mg/dL 8.9 9.2 8.9  Total Protein 6.5 - 8.1 g/dL 6.4(L) 6.7 6.8  Total Bilirubin 0.3 - 1.2 mg/dL 0.2(L) 0.3 0.3  Alkaline Phos 38 - 126 U/L 62 71 69  AST 15 - 41 U/L _2 ALT 0 - 44 U/L <_3 RADIOGRAPHIC STUDIES: I have personally reviewed the radiological images as listed and agreed with the findings in the report. No results found.   ASSESSMENT & PLAN:  1. Gastric cancer ? Presenting with dysphagia spring 2019 ?  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 CTs revealed wall thickening and luminal narrowing of the colon at the hepatic flexure and cecum ? PET scan with no evidence of distant metastatic disease or abnormal uptake other than thickening of the distal esophagus and proximal stomach ? 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 CTs 10/23/2017-diffuse prominence of the gastric wall, especially the antrum, focal wall thickening of the distal ascending colon and hepatic flexure, thickening of the cecum, nonspecific haziness of the mesenteric fat in the pelvis, mild prominence of lymph nodes at the greater curvature of the stomach ? Treatment with FOLFOX beginning July 2019 ? CTs October 2019-stable disease, FOLFOX continued ? CTs December 2019-stable disease ? January 2020 treatment transition to Xeloda maintenance, care transition to [place] ? CTs in June 2020 in September 2020-stable disease, Xeloda continued ? CTs 06/23/2019-nonspecific thickening of the GE junction, intraluminal bladder  mass ? Cystoscopy-metastatic signet ring cell adenocarcinoma ? Cycle 1 day 1 ramucirumab/paclitaxel 08/19/2019(given atanother facility) ? Cycle 1 day 1 ramucirumab/paclitaxel 09/04/2019 ? Cycle 1 day 8 Taxol 09/10/19 ? Cycle 1 day 15 ramucirumab/paclitaxel 6/2/2021canceled d/t neutropenia ? Ramucirumab/pactlitaxelevery 2 weeks 09/24/2019 ? Ramucirumab/paclitaxel 10/08/2019 ? Ramucirumab/paclitaxel 10/22/2019 ? Ramucirumab/paclitaxel 11/05/2019 ? CTs 11/24/2019-unchanged thickening of the distal esophagus/upper stomach, gastric body and antrum.  Unchanged thickening of the transverse colon at the hepatic flexure, unchanged thickening of the urinary bladder, no evidence of progressive disease ? Ramucirumab/paclitaxel 11/25/2019 ? Ramucirumab/paclitaxel 12/09/2019   2.Left breast cancer 2008,pT1c,pN0, status post a left lumpectomy with adjuvant chemotherapy and radiation, ER positive, PR positive, HER-2 positive  3.Mixed connective tissue disease/SLE  4.Lower extremity deep vein thrombosis maintained on apixaban  5.Family history of multiple cancers including breast and ovarian cancer  6.Dysphagia and intermittent vomiting secondary to #1  7.Hypertension  8.Peripheral neuropathy  9.Masslike fullness at the posterior right parotid/angle of the jaw  10. Dyspnea on exertion, ongoing for about a month, worsened after taxol/ramucirumab on 09/04/19 requiring ED visit on 5/23. CBC, CMP, troponin, BNP and chest xray negative  11.Mucositis secondary to chemotherapy 09/17/2019  Disposition: Michelle Cain appears stable.  She completed another cycle of Taxol and Ramucirumab every 2 weeks.  She tolerates treatment well overall.  Her neuropathy is stable.  Her intermittent vomiting after meals has improved.  Her weight is stable overall.  Her painless hematuria is also stable.  She is otherwise able to function and recover well.  I reviewed the CBC and CMP from today.  She will  proceed with another treatment of Taxol and Ramucirumab.  She will return for follow-up and treatment in 2 weeks.    Orders Placed This Encounter  Procedures  . CBC with Differential (Cancer Center Only)    Standing Status:   Future    Standing Expiration Date:   12/08/2020  . CMP ([facility] only)    Standing Status:   Future    Standing Expiration Date:   12/08/2020  . Total Protein, Urine dipstick    Standing Status:  Future    Standing Expiration Date:   12/08/2020   All questions were answered. The patient knows to call the clinic with any problems, questions or concerns. No barriers to learning were detected.     Alla Feeling, NP 12/09/19

## 2019-12-09 ENCOUNTER — Inpatient Hospital Stay: Payer: Medicare Other

## 2019-12-09 ENCOUNTER — Other Ambulatory Visit: Payer: Self-pay

## 2019-12-09 ENCOUNTER — Encounter: Payer: Self-pay | Admitting: Nurse Practitioner

## 2019-12-09 ENCOUNTER — Inpatient Hospital Stay (HOSPITAL_BASED_OUTPATIENT_CLINIC_OR_DEPARTMENT_OTHER): Payer: Medicare Other | Admitting: Nurse Practitioner

## 2019-12-09 VITALS — BP 114/68 | HR 79

## 2019-12-09 VITALS — BP 120/74 | HR 71 | Temp 97.8°F | Resp 18 | Ht 64.0 in | Wt 206.4 lb

## 2019-12-09 DIAGNOSIS — C169 Malignant neoplasm of stomach, unspecified: Secondary | ICD-10-CM

## 2019-12-09 DIAGNOSIS — Z95828 Presence of other vascular implants and grafts: Secondary | ICD-10-CM

## 2019-12-09 DIAGNOSIS — Z5112 Encounter for antineoplastic immunotherapy: Secondary | ICD-10-CM | POA: Diagnosis not present

## 2019-12-09 LAB — CMP (CANCER CENTER ONLY)
ALT: <6 U/L (ref 0–44)
AST: 16 U/L (ref 15–41)
Albumin: 3.3 g/dL — ABNORMAL LOW (ref 3.5–5.0)
Alkaline Phosphatase: 62 U/L (ref 38–126)
Anion gap: 7 (ref 5–15)
BUN: 11 mg/dL (ref 6–20)
CO2: 26 mmol/L (ref 22–32)
Calcium: 8.9 mg/dL (ref 8.9–10.3)
Chloride: 107 mmol/L (ref 98–111)
Creatinine: 0.84 mg/dL (ref 0.44–1.00)
GFR, Est AFR Am: >60 mL/min (ref >60)
GFR, Estimated: >60 mL/min (ref >60)
Glucose, Bld: 85 mg/dL (ref 70–99)
Potassium: 4 mmol/L (ref 3.5–5.1)
Sodium: 140 mmol/L (ref 135–145)
Total Bilirubin: 0.2 mg/dL — ABNORMAL LOW (ref 0.3–1.2)
Total Protein: 6.4 g/dL — ABNORMAL LOW (ref 6.5–8.1)

## 2019-12-09 LAB — CBC WITH DIFFERENTIAL (CANCER CENTER ONLY)
Abs Immature Granulocytes: 0.01 10*3/uL (ref 0.00–0.07)
Basophils Absolute: 0 10*3/uL (ref 0.0–0.1)
Basophils Relative: 0 %
Eosinophils Absolute: 0 10*3/uL (ref 0.0–0.5)
Eosinophils Relative: 1 %
HCT: 31.7 % — ABNORMAL LOW (ref 36.0–46.0)
Hemoglobin: 10.1 g/dL — ABNORMAL LOW (ref 12.0–15.0)
Immature Granulocytes: 0 %
Lymphocytes Relative: 45 %
Lymphs Abs: 1.6 10*3/uL (ref 0.7–4.0)
MCH: 29 pg (ref 26.0–34.0)
MCHC: 31.9 g/dL (ref 30.0–36.0)
MCV: 91.1 fL (ref 80.0–100.0)
Monocytes Absolute: 0.4 10*3/uL (ref 0.1–1.0)
Monocytes Relative: 10 %
Neutro Abs: 1.6 10*3/uL — ABNORMAL LOW (ref 1.7–7.7)
Neutrophils Relative %: 44 %
Platelet Count: 258 10*3/uL (ref 150–400)
RBC: 3.48 MIL/uL — ABNORMAL LOW (ref 3.87–5.11)
RDW: 16.2 % — ABNORMAL HIGH (ref 11.5–15.5)
WBC Count: 3.6 10*3/uL — ABNORMAL LOW (ref 4.0–10.5)
nRBC: 0 % (ref 0.0–0.2)

## 2019-12-09 MED ORDER — DIPHENHYDRAMINE HCL 50 MG/ML IJ SOLN
INTRAMUSCULAR | Status: AC
  Filled 2019-12-09: qty 1

## 2019-12-09 MED ORDER — FAMOTIDINE IN NACL 20-0.9 MG/50ML-% IV SOLN
20.0000 mg | Freq: Once | INTRAVENOUS | Status: AC
  Administered 2019-12-09: 20 mg via INTRAVENOUS

## 2019-12-09 MED ORDER — HEPARIN SOD (PORK) LOCK FLUSH 100 UNIT/ML IV SOLN
500.0000 [IU] | Freq: Once | INTRAVENOUS | Status: AC | PRN
  Administered 2019-12-09: 500 [IU]
  Filled 2019-12-09: qty 5

## 2019-12-09 MED ORDER — SODIUM CHLORIDE 0.9 % IV SOLN
10.0000 mg | Freq: Once | INTRAVENOUS | Status: AC
  Administered 2019-12-09: 10 mg via INTRAVENOUS
  Filled 2019-12-09: qty 10

## 2019-12-09 MED ORDER — FAMOTIDINE IN NACL 20-0.9 MG/50ML-% IV SOLN
INTRAVENOUS | Status: AC
  Filled 2019-12-09: qty 50

## 2019-12-09 MED ORDER — SODIUM CHLORIDE 0.9% FLUSH
10.0000 mL | Freq: Once | INTRAVENOUS | Status: AC
  Administered 2019-12-09: 10 mL
  Filled 2019-12-09: qty 10

## 2019-12-09 MED ORDER — SODIUM CHLORIDE 0.9 % IV SOLN
Freq: Once | INTRAVENOUS | Status: AC
  Filled 2019-12-09: qty 250

## 2019-12-09 MED ORDER — DIPHENHYDRAMINE HCL 50 MG/ML IJ SOLN
25.0000 mg | Freq: Once | INTRAMUSCULAR | Status: AC
  Administered 2019-12-09: 25 mg via INTRAVENOUS

## 2019-12-09 MED ORDER — SODIUM CHLORIDE 0.9 % IV SOLN
60.0000 mg/m2 | Freq: Once | INTRAVENOUS | Status: AC
  Administered 2019-12-09: 126 mg via INTRAVENOUS
  Filled 2019-12-09: qty 21

## 2019-12-09 MED ORDER — SODIUM CHLORIDE 0.9% FLUSH
10.0000 mL | INTRAVENOUS | Status: DC | PRN
  Administered 2019-12-09: 10 mL
  Filled 2019-12-09: qty 10

## 2019-12-09 MED ORDER — SODIUM CHLORIDE 0.9 % IV SOLN
8.0000 mg/kg | Freq: Once | INTRAVENOUS | Status: AC
  Administered 2019-12-09: 800 mg via INTRAVENOUS
  Filled 2019-12-09: qty 50

## 2019-12-09 NOTE — Progress Notes (Signed)
Spoke with MD, he wont check anymore UAs on the patient due to bladder cancer.  Larene Beach, PharmD

## 2019-12-09 NOTE — Patient Instructions (Signed)
[facility] Discharge Instructions for Patients Receiving Chemotherapy  Today you received the following chemotherapy agents Ramucirumab [facility]) & Paclitaxel (TAXOL).  To help prevent nausea and vomiting after your treatment, we encourage you to take your nausea medication as prescribed.   If you develop nausea and vomiting that is not controlled by your nausea medication, call the clinic.   BELOW ARE SYMPTOMS THAT SHOULD BE REPORTED IMMEDIATELY:  *FEVER GREATER THAN 100.5 F  *CHILLS WITH OR WITHOUT FEVER  NAUSEA AND VOMITING THAT IS NOT CONTROLLED WITH YOUR NAUSEA MEDICATION  *UNUSUAL SHORTNESS OF BREATH  *UNUSUAL BRUISING OR BLEEDING  TENDERNESS IN MOUTH AND THROAT WITH OR WITHOUT PRESENCE OF ULCERS  *URINARY PROBLEMS  *BOWEL PROBLEMS  UNUSUAL RASH Items with * indicate a potential emergency and should be followed up as soon as possible.  Feel free to call the clinic should you have any questions or concerns. The clinic phone number is [phone].  Please show the [place] at check-in to the Emergency Department and triage nurse.

## 2019-12-10 ENCOUNTER — Telehealth: Payer: Self-pay | Admitting: Oncology

## 2019-12-10 NOTE — Telephone Encounter (Signed)
Scheduled per 8/24 los. Pt is aware of appt times and dates. 9/7 and 9/21

## 2019-12-21 ENCOUNTER — Other Ambulatory Visit: Payer: Self-pay | Admitting: Oncology

## 2019-12-23 ENCOUNTER — Inpatient Hospital Stay: Payer: Medicare Other

## 2019-12-23 ENCOUNTER — Other Ambulatory Visit: Payer: Self-pay

## 2019-12-23 ENCOUNTER — Inpatient Hospital Stay (HOSPITAL_BASED_OUTPATIENT_CLINIC_OR_DEPARTMENT_OTHER): Payer: Medicare Other | Admitting: Oncology

## 2019-12-23 ENCOUNTER — Inpatient Hospital Stay: Payer: Medicare Other | Attending: Oncology

## 2019-12-23 VITALS — BP 117/69 | HR 79 | Temp 96.6°F | Resp 17 | Ht 64.0 in | Wt 204.3 lb

## 2019-12-23 DIAGNOSIS — I10 Essential (primary) hypertension: Secondary | ICD-10-CM | POA: Diagnosis not present

## 2019-12-23 DIAGNOSIS — G629 Polyneuropathy, unspecified: Secondary | ICD-10-CM | POA: Insufficient documentation

## 2019-12-23 DIAGNOSIS — K1231 Oral mucositis (ulcerative) due to antineoplastic therapy: Secondary | ICD-10-CM | POA: Diagnosis not present

## 2019-12-23 DIAGNOSIS — Z5111 Encounter for antineoplastic chemotherapy: Secondary | ICD-10-CM | POA: Insufficient documentation

## 2019-12-23 DIAGNOSIS — Z853 Personal history of malignant neoplasm of breast: Secondary | ICD-10-CM | POA: Diagnosis not present

## 2019-12-23 DIAGNOSIS — R319 Hematuria, unspecified: Secondary | ICD-10-CM | POA: Diagnosis not present

## 2019-12-23 DIAGNOSIS — Z5112 Encounter for antineoplastic immunotherapy: Secondary | ICD-10-CM | POA: Insufficient documentation

## 2019-12-23 DIAGNOSIS — D709 Neutropenia, unspecified: Secondary | ICD-10-CM | POA: Diagnosis not present

## 2019-12-23 DIAGNOSIS — C169 Malignant neoplasm of stomach, unspecified: Secondary | ICD-10-CM

## 2019-12-23 LAB — CMP (CANCER CENTER ONLY)
ALT: 9 U/L (ref 0–44)
AST: 14 U/L — ABNORMAL LOW (ref 15–41)
Albumin: 3.4 g/dL — ABNORMAL LOW (ref 3.5–5.0)
Alkaline Phosphatase: 59 U/L (ref 38–126)
Anion gap: 5 (ref 5–15)
BUN: 11 mg/dL (ref 6–20)
CO2: 26 mmol/L (ref 22–32)
Calcium: 8.6 mg/dL — ABNORMAL LOW (ref 8.9–10.3)
Chloride: 107 mmol/L (ref 98–111)
Creatinine: 0.77 mg/dL (ref 0.44–1.00)
GFR, Est AFR Am: >60 mL/min (ref >60)
GFR, Estimated: >60 mL/min (ref >60)
Glucose, Bld: 80 mg/dL (ref 70–99)
Potassium: 4.3 mmol/L (ref 3.5–5.1)
Sodium: 138 mmol/L (ref 135–145)
Total Bilirubin: 0.3 mg/dL (ref 0.3–1.2)
Total Protein: 6.7 g/dL (ref 6.5–8.1)

## 2019-12-23 LAB — CBC WITH DIFFERENTIAL (CANCER CENTER ONLY)
Abs Immature Granulocytes: 0 10*3/uL (ref 0.00–0.07)
Basophils Absolute: 0 10*3/uL (ref 0.0–0.1)
Basophils Relative: 0 %
Eosinophils Absolute: 0 10*3/uL (ref 0.0–0.5)
Eosinophils Relative: 1 %
HCT: 31 % — ABNORMAL LOW (ref 36.0–46.0)
Hemoglobin: 9.8 g/dL — ABNORMAL LOW (ref 12.0–15.0)
Immature Granulocytes: 0 %
Lymphocytes Relative: 43 %
Lymphs Abs: 1.3 10*3/uL (ref 0.7–4.0)
MCH: 28.3 pg (ref 26.0–34.0)
MCHC: 31.6 g/dL (ref 30.0–36.0)
MCV: 89.6 fL (ref 80.0–100.0)
Monocytes Absolute: 0.4 10*3/uL (ref 0.1–1.0)
Monocytes Relative: 13 %
Neutro Abs: 1.3 10*3/uL — ABNORMAL LOW (ref 1.7–7.7)
Neutrophils Relative %: 43 %
Platelet Count: 233 10*3/uL (ref 150–400)
RBC: 3.46 MIL/uL — ABNORMAL LOW (ref 3.87–5.11)
RDW: 16.9 % — ABNORMAL HIGH (ref 11.5–15.5)
WBC Count: 3.1 10*3/uL — ABNORMAL LOW (ref 4.0–10.5)
nRBC: 0 % (ref 0.0–0.2)

## 2019-12-23 LAB — TOTAL PROTEIN, URINE DIPSTICK: Protein, ur: 100 mg/dL — AB

## 2019-12-23 MED ORDER — FAMOTIDINE IN NACL 20-0.9 MG/50ML-% IV SOLN
20.0000 mg | Freq: Once | INTRAVENOUS | Status: AC
  Administered 2019-12-23: 20 mg via INTRAVENOUS

## 2019-12-23 MED ORDER — DIPHENHYDRAMINE HCL 50 MG/ML IJ SOLN
INTRAMUSCULAR | Status: AC
  Filled 2019-12-23: qty 1

## 2019-12-23 MED ORDER — SODIUM CHLORIDE 0.9 % IV SOLN
8.0000 mg/kg | Freq: Once | INTRAVENOUS | Status: AC
  Administered 2019-12-23: 800 mg via INTRAVENOUS
  Filled 2019-12-23: qty 50

## 2019-12-23 MED ORDER — FAMOTIDINE IN NACL 20-0.9 MG/50ML-% IV SOLN
INTRAVENOUS | Status: AC
  Filled 2019-12-23: qty 50

## 2019-12-23 MED ORDER — LORAZEPAM 0.5 MG PO TABS: 0.5000 mg | ORAL_TABLET | Freq: Four times a day (QID) | ORAL | 2 refills | Status: DC | PRN

## 2019-12-23 MED ORDER — DIPHENHYDRAMINE HCL 50 MG/ML IJ SOLN
25.0000 mg | Freq: Once | INTRAMUSCULAR | Status: AC
  Administered 2019-12-23: 25 mg via INTRAVENOUS

## 2019-12-23 MED ORDER — HEPARIN SOD (PORK) LOCK FLUSH 100 UNIT/ML IV SOLN
500.0000 [IU] | Freq: Once | INTRAVENOUS | Status: AC | PRN
  Administered 2019-12-23: 500 [IU]
  Filled 2019-12-23: qty 5

## 2019-12-23 MED ORDER — SODIUM CHLORIDE 0.9 % IV SOLN
60.0000 mg/m2 | Freq: Once | INTRAVENOUS | Status: AC
  Administered 2019-12-23: 126 mg via INTRAVENOUS
  Filled 2019-12-23: qty 21

## 2019-12-23 MED ORDER — SODIUM CHLORIDE 0.9% FLUSH
10.0000 mL | INTRAVENOUS | Status: DC | PRN
  Administered 2019-12-23: 10 mL
  Filled 2019-12-23: qty 10

## 2019-12-23 MED ORDER — SODIUM CHLORIDE 0.9 % IV SOLN
10.0000 mg | Freq: Once | INTRAVENOUS | Status: AC
  Administered 2019-12-23: 10 mg via INTRAVENOUS
  Filled 2019-12-23: qty 10

## 2019-12-23 MED ORDER — SODIUM CHLORIDE 0.9 % IV SOLN
Freq: Once | INTRAVENOUS | Status: AC
  Filled 2019-12-23: qty 250

## 2019-12-23 NOTE — Progress Notes (Signed)
[facility] OFFICE PROGRESS NOTE   Diagnosis: Gastric cancer  INTERVAL HISTORY:   Michelle Cain returns as scheduled.  She completed another treatment with Taxol and ramucirumab on 12/09/2019.  She reports mild tingling and numbness in the extremities.  This is unchanged.  She has intermittent hematuria.  No other bleeding.  She reports intermittent episodes of vomiting including yesterday.  Mild abdominal pain.  Objective:  Vital signs in last 24 hours:  Blood pressure 117/69, pulse 79, temperature (!) 96.6 F (35.9 C), temperature source Tympanic, resp. rate 17, height _0  (1.626 m), weight 204 lb 4.8 oz (92.7 kg), SpO2 100 %.    HEENT: No thrush or ulcers Resp: Lungs clear bilaterally Cardio: Regular rate and rhythm GI: No hepatosplenomegaly, nontender, no mass Vascular: No leg edema  Portacath/PICC-without erythema  Lab Results:  Lab Results  Component Value Date   WBC 3.1 (L) 12/23/2019   HGB 9.8 (L) 12/23/2019   HCT 31.0 (L) 12/23/2019   MCV 89.6 12/23/2019   PLT 233 12/23/2019   NEUTROABS 1.3 (L) 12/23/2019    CMP  Lab Results  Component Value Date   NA 138 12/23/2019   K 4.3 12/23/2019   CL 107 12/23/2019   CO2 26 12/23/2019   GLUCOSE 80 12/23/2019   BUN 11 12/23/2019   CREATININE 0.77 12/23/2019   CALCIUM 8.6 (L) 12/23/2019   PROT 6.7 12/23/2019   ALBUMIN 3.4 (L) 12/23/2019   AST 14 (L) 12/23/2019   ALT 9 12/23/2019   ALKPHOS 59 12/23/2019   BILITOT 0.3 12/23/2019   GFRNONAA >60 12/23/2019   GFRAA >60 12/23/2019    Lab Results  Component Value Date   CEA1 3.45 09/04/2019     Medications: I have reviewed the patient's current medications.   Assessment/Plan: 1. Gastric cancer ? Presenting with dysphagia spring 2019 ? 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 CTs revealed wall thickening and luminal narrowing of the colon at the hepatic flexure and cecum ? PET scan with no evidence of distant metastatic disease or abnormal uptake other than thickening of the distal esophagus and proximal stomach ? 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 CTs 10/23/2017-diffuse prominence of the gastric wall, especially the antrum, focal wall thickening of the distal ascending colon and hepatic flexure, thickening of the cecum, nonspecific haziness of the mesenteric fat in the pelvis, mild prominence of lymph nodes at the greater curvature of the stomach ? Treatment with FOLFOX beginning July 2019 ? CTs October 2019-stable disease, FOLFOX continued ? CTs December 2019-stable disease ? January 2020 treatment transition to Xeloda maintenance, care transition to [place] ? CTs in June 2020 in September 2020-stable disease, Xeloda continued ? CTs 06/23/2019-nonspecific thickening of the GE junction, intraluminal bladder mass ? Cystoscopy-metastatic signet ring cell adenocarcinoma ? Cycle 1 day 1 ramucirumab/paclitaxel 08/19/2019(given atanother facility) ? Cycle 1 day 1 ramucirumab/paclitaxel 09/04/2019 ? Cycle 1 day 8 Taxol 09/10/19 ? Cycle 1 day 15 ramucirumab/paclitaxel 6/2/2021canceled d/t neutropenia ? Ramucirumab/pactlitaxelevery  2 weeks 09/24/2019 ? Ramucirumab/paclitaxel 10/08/2019 ? Ramucirumab/paclitaxel 10/22/2019 ? Ramucirumab/paclitaxel 11/05/2019 ? CTs 11/24/2019-unchanged  thickening of the distal esophagus/upper stomach, gastric body and antrum.  Unchanged thickening of the transverse colon at the hepatic flexure, unchanged thickening of the urinary bladder, no evidence of progressive disease ? Ramucirumab/paclitaxel 11/25/2019 ? Ramucirumab/paclitaxel 12/09/2019  ? Ramucirumab/paclitaxel 12/23/2019  2.Left breast cancer 2008,pT1c,pN0, status post a left lumpectomy with adjuvant chemotherapy and radiation, ER positive, PR positive, HER-2 positive  3.Mixed connective tissue disease/SLE  4.Lower extremity deep vein thrombosis maintained on apixaban  5.Family history of multiple cancers including breast and ovarian cancer  6.Dysphagia and intermittent vomiting secondary to #1  7.Hypertension  8.Peripheral neuropathy  9.Masslike fullness at the posterior right parotid/angle of the jaw  10. Dyspnea on exertion, ongoing for about a month, worsened after taxol/ramucirumab on 09/04/19 requiring ED visit on 5/23. CBC, CMP, troponin, BNP and chest xray negative  11.Mucositis secondary to chemotherapy 09/17/2019    Disposition: Michelle Cain appears unchanged.  She will complete another treatment with ramucirumab and Taxol today.  She will return for an office visit and chemotherapy in 2 weeks.  She has mild neutropenia today.  The plan is to proceed with chemotherapy.  She will call for a fever or symptoms of infection.  She declines the COVID-19 vaccine.  Betsy Coder, MD  12/23/2019  12:13 PM

## 2019-12-23 NOTE — Progress Notes (Signed)
Per Dr. Benay Spice: OK to treat w/ANC 1.3 and urine protein 100 (has hematuria from bladder tumor)

## 2019-12-23 NOTE — Patient Instructions (Signed)

## 2019-12-23 NOTE — Patient Instructions (Signed)
[facility] Discharge Instructions for Patients Receiving Chemotherapy  Today you received the following chemotherapy agents: Ramucirumab (Cyramza) and Paclitaxel (Taxol)  To help prevent nausea and vomiting after your treatment, we encourage you to take your nausea medication as prescribed.    If you develop nausea and vomiting that is not controlled by your nausea medication, call the clinic.   BELOW ARE SYMPTOMS THAT SHOULD BE REPORTED IMMEDIATELY:  *FEVER GREATER THAN 100.5 F  *CHILLS WITH OR WITHOUT FEVER  NAUSEA AND VOMITING THAT IS NOT CONTROLLED WITH YOUR NAUSEA MEDICATION  *UNUSUAL SHORTNESS OF BREATH  *UNUSUAL BRUISING OR BLEEDING  TENDERNESS IN MOUTH AND THROAT WITH OR WITHOUT PRESENCE OF ULCERS  *URINARY PROBLEMS  *BOWEL PROBLEMS  UNUSUAL RASH Items with * indicate a potential emergency and should be followed up as soon as possible.  Feel free to call the clinic should you have any questions or concerns. The clinic phone number is [phone].  Please show the [facility] at check-in to the Emergency Department and triage nurse.

## 2020-01-02 ENCOUNTER — Other Ambulatory Visit: Payer: Self-pay | Admitting: Oncology

## 2020-01-06 ENCOUNTER — Inpatient Hospital Stay (HOSPITAL_BASED_OUTPATIENT_CLINIC_OR_DEPARTMENT_OTHER): Payer: Medicare Other | Admitting: Nurse Practitioner

## 2020-01-06 ENCOUNTER — Inpatient Hospital Stay: Payer: Medicare Other

## 2020-01-06 ENCOUNTER — Other Ambulatory Visit: Payer: Medicare Other

## 2020-01-06 ENCOUNTER — Encounter: Payer: Self-pay | Admitting: Nurse Practitioner

## 2020-01-06 ENCOUNTER — Telehealth: Payer: Self-pay | Admitting: Nurse Practitioner

## 2020-01-06 ENCOUNTER — Ambulatory Visit: Payer: Medicare Other

## 2020-01-06 ENCOUNTER — Ambulatory Visit: Payer: Medicare Other | Admitting: Oncology

## 2020-01-06 ENCOUNTER — Other Ambulatory Visit: Payer: Self-pay

## 2020-01-06 VITALS — BP 116/59 | HR 82 | Temp 96.0°F | Resp 15 | Wt 203.6 lb

## 2020-01-06 DIAGNOSIS — Z95828 Presence of other vascular implants and grafts: Secondary | ICD-10-CM

## 2020-01-06 DIAGNOSIS — C169 Malignant neoplasm of stomach, unspecified: Secondary | ICD-10-CM | POA: Diagnosis not present

## 2020-01-06 DIAGNOSIS — Z5112 Encounter for antineoplastic immunotherapy: Secondary | ICD-10-CM | POA: Diagnosis not present

## 2020-01-06 LAB — CBC WITH DIFFERENTIAL (CANCER CENTER ONLY)
Abs Immature Granulocytes: 0.01 10*3/uL (ref 0.00–0.07)
Basophils Absolute: 0 10*3/uL (ref 0.0–0.1)
Basophils Relative: 0 %
Eosinophils Absolute: 0 10*3/uL (ref 0.0–0.5)
Eosinophils Relative: 0 %
HCT: 30 % — ABNORMAL LOW (ref 36.0–46.0)
Hemoglobin: 9.5 g/dL — ABNORMAL LOW (ref 12.0–15.0)
Immature Granulocytes: 0 %
Lymphocytes Relative: 34 %
Lymphs Abs: 1.2 10*3/uL (ref 0.7–4.0)
MCH: 28.1 pg (ref 26.0–34.0)
MCHC: 31.7 g/dL (ref 30.0–36.0)
MCV: 88.8 fL (ref 80.0–100.0)
Monocytes Absolute: 0.5 10*3/uL (ref 0.1–1.0)
Monocytes Relative: 13 %
Neutro Abs: 1.9 10*3/uL (ref 1.7–7.7)
Neutrophils Relative %: 53 %
Platelet Count: 255 10*3/uL (ref 150–400)
RBC: 3.38 MIL/uL — ABNORMAL LOW (ref 3.87–5.11)
RDW: 17.2 % — ABNORMAL HIGH (ref 11.5–15.5)
WBC Count: 3.6 10*3/uL — ABNORMAL LOW (ref 4.0–10.5)
nRBC: 0 % (ref 0.0–0.2)

## 2020-01-06 LAB — CMP (CANCER CENTER ONLY)
ALT: 9 U/L (ref 0–44)
AST: 14 U/L — ABNORMAL LOW (ref 15–41)
Albumin: 3.2 g/dL — ABNORMAL LOW (ref 3.5–5.0)
Alkaline Phosphatase: 65 U/L (ref 38–126)
Anion gap: 6 (ref 5–15)
BUN: 10 mg/dL (ref 6–20)
CO2: 26 mmol/L (ref 22–32)
Calcium: 8.5 mg/dL — ABNORMAL LOW (ref 8.9–10.3)
Chloride: 108 mmol/L (ref 98–111)
Creatinine: 0.8 mg/dL (ref 0.44–1.00)
GFR, Est AFR Am: >60 mL/min (ref >60)
GFR, Estimated: >60 mL/min (ref >60)
Glucose, Bld: 99 mg/dL (ref 70–99)
Potassium: 3.8 mmol/L (ref 3.5–5.1)
Sodium: 140 mmol/L (ref 135–145)
Total Bilirubin: 0.3 mg/dL (ref 0.3–1.2)
Total Protein: 6.4 g/dL — ABNORMAL LOW (ref 6.5–8.1)

## 2020-01-06 MED ORDER — SODIUM CHLORIDE 0.9% FLUSH
10.0000 mL | Freq: Once | INTRAVENOUS | Status: AC
  Administered 2020-01-06: 10 mL
  Filled 2020-01-06: qty 10

## 2020-01-06 MED ORDER — FAMOTIDINE IN NACL 20-0.9 MG/50ML-% IV SOLN
20.0000 mg | Freq: Once | INTRAVENOUS | Status: AC
  Administered 2020-01-06: 20 mg via INTRAVENOUS

## 2020-01-06 MED ORDER — SODIUM CHLORIDE 0.9 % IV SOLN
10.0000 mg | Freq: Once | INTRAVENOUS | Status: AC
  Administered 2020-01-06: 10 mg via INTRAVENOUS
  Filled 2020-01-06: qty 10

## 2020-01-06 MED ORDER — SODIUM CHLORIDE 0.9 % IV SOLN
8.0000 mg/kg | Freq: Once | INTRAVENOUS | Status: AC
  Administered 2020-01-06: 800 mg via INTRAVENOUS
  Filled 2020-01-06: qty 50

## 2020-01-06 MED ORDER — SODIUM CHLORIDE 0.9 % IV SOLN
Freq: Once | INTRAVENOUS | Status: AC
  Filled 2020-01-06: qty 250

## 2020-01-06 MED ORDER — DIPHENHYDRAMINE HCL 50 MG/ML IJ SOLN
25.0000 mg | Freq: Once | INTRAMUSCULAR | Status: AC
  Administered 2020-01-06: 25 mg via INTRAVENOUS

## 2020-01-06 MED ORDER — HEPARIN SOD (PORK) LOCK FLUSH 100 UNIT/ML IV SOLN
500.0000 [IU] | Freq: Once | INTRAVENOUS | Status: AC | PRN
  Administered 2020-01-06: 500 [IU]
  Filled 2020-01-06: qty 5

## 2020-01-06 MED ORDER — SODIUM CHLORIDE 0.9 % IV SOLN
60.0000 mg/m2 | Freq: Once | INTRAVENOUS | Status: AC
  Administered 2020-01-06: 126 mg via INTRAVENOUS
  Filled 2020-01-06: qty 21

## 2020-01-06 MED ORDER — DIPHENHYDRAMINE HCL 50 MG/ML IJ SOLN
INTRAMUSCULAR | Status: AC
  Filled 2020-01-06: qty 1

## 2020-01-06 MED ORDER — FAMOTIDINE IN NACL 20-0.9 MG/50ML-% IV SOLN
INTRAVENOUS | Status: AC
  Filled 2020-01-06: qty 50

## 2020-01-06 MED ORDER — SODIUM CHLORIDE 0.9% FLUSH
10.0000 mL | INTRAVENOUS | Status: DC | PRN
  Administered 2020-01-06: 10 mL
  Filled 2020-01-06: qty 10

## 2020-01-06 NOTE — Telephone Encounter (Signed)
Scheduled appointment per 9/21 los. Spoke to patient's mother who is aware of appointment date and time. Gave her updated calendar print out.

## 2020-01-06 NOTE — Patient Instructions (Signed)
[facility] Discharge Instructions for Patients Receiving Chemotherapy  Today you received the following chemotherapy agents: Ramucirumab (Cyramza) and Paclitaxel (Taxol)  To help prevent nausea and vomiting after your treatment, we encourage you to take your nausea medication as prescribed.    If you develop nausea and vomiting that is not controlled by your nausea medication, call the clinic.   BELOW ARE SYMPTOMS THAT SHOULD BE REPORTED IMMEDIATELY:  *FEVER GREATER THAN 100.5 F  *CHILLS WITH OR WITHOUT FEVER  NAUSEA AND VOMITING THAT IS NOT CONTROLLED WITH YOUR NAUSEA MEDICATION  *UNUSUAL SHORTNESS OF BREATH  *UNUSUAL BRUISING OR BLEEDING  TENDERNESS IN MOUTH AND THROAT WITH OR WITHOUT PRESENCE OF ULCERS  *URINARY PROBLEMS  *BOWEL PROBLEMS  UNUSUAL RASH Items with * indicate a potential emergency and should be followed up as soon as possible.  Feel free to call the clinic should you have any questions or concerns. The clinic phone number is [phone].  Please show the [facility] at check-in to the Emergency Department and triage nurse.

## 2020-01-06 NOTE — Progress Notes (Signed)
[facility] OFFICE PROGRESS NOTE   Diagnosis: Gastric cancer  INTERVAL HISTORY:   [name] returns as scheduled.  She completed another cycle of ramucirumab and Taxol 12/23/2019.  She had less nausea/vomiting than with previous cycles.  No mouth sores.  No diarrhea or constipation.  Stable neuropathy symptoms.  She continues to have intermittent hematuria.  This has been occurring for 6 months.  No other bleeding.  She denies pain.  Objective:  Vital signs in last 24 hours:  Blood pressure (!) 116/59, pulse 82, temperature (!) 96 F (35.6 C), temperature source Tympanic, resp. rate 15, weight 203 lb 9.6 oz (92.4 kg), SpO2 100 %.    HEENT: No thrush or ulcers. Resp: Lungs clear bilaterally. Cardio: Regular rate and rhythm. GI: Abdomen soft and nontender.  No hepatomegaly.  No mass. Vascular: No leg edema. Port-A-Cath without erythema.  Lab Results:  Lab Results  Component Value Date   WBC 3.6 (L) 01/06/2020   HGB 9.5 (L) 01/06/2020   HCT 30.0 (L) 01/06/2020   MCV 88.8 01/06/2020   PLT 255 01/06/2020   NEUTROABS 1.9 01/06/2020    Imaging:  No results found.  Medications: I have reviewed the patient's current medications.  Assessment/Plan: 1. Gastric cancer ? Presenting with dysphagia spring 2019 ? 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 CTs revealed wall thickening and luminal narrowing of the colon at the hepatic flexure and cecum ? PET scan with no evidence of distant metastatic disease or abnormal uptake other than thickening of the distal esophagus and proximal stomach ? 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 CTs 10/23/2017-diffuse prominence of the gastric wall, especially the antrum, focal wall thickening of the distal ascending colon and hepatic flexure, thickening of the cecum, nonspecific haziness of the mesenteric fat in the pelvis, mild prominence of lymph nodes at the greater curvature of the stomach ? Treatment with FOLFOX beginning July 2019 ? CTs October 2019-stable disease, FOLFOX continued ? CTs December 2019-stable disease ? January 2020 treatment transition to Xeloda maintenance, care transition to [place] ? CTs in June 2020 in September 2020-stable disease, Xeloda continued ? CTs 06/23/2019-nonspecific thickening of the GE junction, intraluminal bladder mass ? Cystoscopy-metastatic signet ring cell adenocarcinoma ? Cycle 1 day 1 ramucirumab/paclitaxel 08/19/2019(given atanother facility) ? Cycle 1 day 1 ramucirumab/paclitaxel 09/04/2019 ? Cycle 1 day 8 Taxol 09/10/19 ? Cycle 1 day 15 ramucirumab/paclitaxel 6/2/2021canceled d/t neutropenia ? Ramucirumab/pactlitaxelevery 2 weeks 09/24/2019 ? Ramucirumab/paclitaxel 10/08/2019 ? Ramucirumab/paclitaxel 10/22/2019 ? Ramucirumab/paclitaxel 11/05/2019 ? CTs 11/24/2019-unchanged thickening of the distal esophagus/upper stomach, gastric body and antrum. Unchanged thickening of the transverse colon at the hepatic flexure, unchanged thickening of the urinary bladder, no evidence of progressive disease ? Ramucirumab/paclitaxel 11/25/2019 ? Ramucirumab/paclitaxel 12/09/2019  ? Ramucirumab/paclitaxel 12/23/2019 ? Ramucirumab/paclitaxel 01/06/2020  2.Left breast cancer 2008,pT1c,pN0, status post a left lumpectomy with  adjuvant chemotherapy and radiation, ER positive, PR positive, HER-2 positive  3.Mixed connective tissue disease/SLE  4.Lower extremity deep vein thrombosis maintained on apixaban  5.Family history of multiple cancers including breast and ovarian cancer  6.Dysphagia and intermittent vomiting secondary to #1  7.Hypertension  8.Peripheral neuropathy  9.Masslike fullness at the posterior right parotid/angle of the jaw  10. Dyspnea on exertion, ongoing for about a month, worsened after taxol/ramucirumab on 09/04/19 requiring ED visit on 5/23. CBC, CMP, troponin, BNP and chest xray negative  11.Mucositis secondary to chemotherapy 09/17/2019  Disposition: [name] appears unchanged.  There is no clinical evidence of disease progression.  Plan to proceed with ramucirumab and Taxol today as scheduled.  We reviewed the CBC and chemistry panel from today.  Labs adequate to proceed as above.  She will return for lab, follow-up, treatment in 2 weeks.  She will contact the office in the interim with any problems.    Ned Card ANP/GNP-BC   01/06/2020  10:20 AM

## 2020-01-07 ENCOUNTER — Telehealth: Payer: Self-pay | Admitting: Oncology

## 2020-01-07 NOTE — Telephone Encounter (Signed)
Rescheduled appointments per 9/20 scheduling message. Spoke with patient who is aware of appointment change.

## 2020-01-09 ENCOUNTER — Other Ambulatory Visit: Payer: Medicare Other

## 2020-01-09 ENCOUNTER — Ambulatory Visit: Payer: Medicare Other

## 2020-01-09 ENCOUNTER — Encounter: Payer: Medicare Other | Admitting: Nutrition

## 2020-01-09 ENCOUNTER — Ambulatory Visit: Payer: Medicare Other | Admitting: Nurse Practitioner

## 2020-01-18 ENCOUNTER — Other Ambulatory Visit: Payer: Self-pay | Admitting: Oncology

## 2020-01-20 ENCOUNTER — Inpatient Hospital Stay: Payer: Medicare Other

## 2020-01-20 ENCOUNTER — Other Ambulatory Visit: Payer: Medicare Other

## 2020-01-20 ENCOUNTER — Ambulatory Visit: Payer: Medicare Other

## 2020-01-20 ENCOUNTER — Inpatient Hospital Stay (HOSPITAL_BASED_OUTPATIENT_CLINIC_OR_DEPARTMENT_OTHER): Payer: Medicare Other | Admitting: Nurse Practitioner

## 2020-01-20 ENCOUNTER — Ambulatory Visit: Payer: Medicare Other | Admitting: Oncology

## 2020-01-20 ENCOUNTER — Other Ambulatory Visit: Payer: Self-pay

## 2020-01-20 ENCOUNTER — Ambulatory Visit: Payer: Medicare Other | Admitting: Nurse Practitioner

## 2020-01-20 ENCOUNTER — Inpatient Hospital Stay: Payer: Medicare Other | Attending: Oncology

## 2020-01-20 ENCOUNTER — Encounter: Payer: Self-pay | Admitting: Nurse Practitioner

## 2020-01-20 VITALS — BP 119/65 | HR 85 | Temp 97.2°F | Resp 17 | Ht 64.0 in | Wt 200.3 lb

## 2020-01-20 DIAGNOSIS — R5383 Other fatigue: Secondary | ICD-10-CM

## 2020-01-20 DIAGNOSIS — Z5111 Encounter for antineoplastic chemotherapy: Secondary | ICD-10-CM | POA: Insufficient documentation

## 2020-01-20 DIAGNOSIS — Z5112 Encounter for antineoplastic immunotherapy: Secondary | ICD-10-CM | POA: Diagnosis present

## 2020-01-20 DIAGNOSIS — Z853 Personal history of malignant neoplasm of breast: Secondary | ICD-10-CM | POA: Diagnosis not present

## 2020-01-20 DIAGNOSIS — C169 Malignant neoplasm of stomach, unspecified: Secondary | ICD-10-CM

## 2020-01-20 DIAGNOSIS — I10 Essential (primary) hypertension: Secondary | ICD-10-CM | POA: Insufficient documentation

## 2020-01-20 DIAGNOSIS — Z79899 Other long term (current) drug therapy: Secondary | ICD-10-CM | POA: Diagnosis not present

## 2020-01-20 DIAGNOSIS — G629 Polyneuropathy, unspecified: Secondary | ICD-10-CM | POA: Insufficient documentation

## 2020-01-20 DIAGNOSIS — Z95828 Presence of other vascular implants and grafts: Secondary | ICD-10-CM

## 2020-01-20 LAB — CMP (CANCER CENTER ONLY)
ALT: 7 U/L (ref 0–44)
AST: 13 U/L — ABNORMAL LOW (ref 15–41)
Albumin: 3.2 g/dL — ABNORMAL LOW (ref 3.5–5.0)
Alkaline Phosphatase: 66 U/L (ref 38–126)
Anion gap: 4 — ABNORMAL LOW (ref 5–15)
BUN: 15 mg/dL (ref 6–20)
CO2: 28 mmol/L (ref 22–32)
Calcium: 8.9 mg/dL (ref 8.9–10.3)
Chloride: 107 mmol/L (ref 98–111)
Creatinine: 0.78 mg/dL (ref 0.44–1.00)
GFR, Estimated: >60 mL/min (ref >60)
Glucose, Bld: 77 mg/dL (ref 70–99)
Potassium: 4 mmol/L (ref 3.5–5.1)
Sodium: 139 mmol/L (ref 135–145)
Total Bilirubin: 0.2 mg/dL — ABNORMAL LOW (ref 0.3–1.2)
Total Protein: 6.8 g/dL (ref 6.5–8.1)

## 2020-01-20 LAB — CBC WITH DIFFERENTIAL (CANCER CENTER ONLY)
Abs Immature Granulocytes: 0.01 10*3/uL (ref 0.00–0.07)
Basophils Absolute: 0 10*3/uL (ref 0.0–0.1)
Basophils Relative: 0 %
Eosinophils Absolute: 0 10*3/uL (ref 0.0–0.5)
Eosinophils Relative: 0 %
HCT: 29.4 % — ABNORMAL LOW (ref 36.0–46.0)
Hemoglobin: 9.3 g/dL — ABNORMAL LOW (ref 12.0–15.0)
Immature Granulocytes: 0 %
Lymphocytes Relative: 30 %
Lymphs Abs: 1.4 10*3/uL (ref 0.7–4.0)
MCH: 27.5 pg (ref 26.0–34.0)
MCHC: 31.6 g/dL (ref 30.0–36.0)
MCV: 87 fL (ref 80.0–100.0)
Monocytes Absolute: 0.6 10*3/uL (ref 0.1–1.0)
Monocytes Relative: 14 %
Neutro Abs: 2.6 10*3/uL (ref 1.7–7.7)
Neutrophils Relative %: 56 %
Platelet Count: 283 10*3/uL (ref 150–400)
RBC: 3.38 MIL/uL — ABNORMAL LOW (ref 3.87–5.11)
RDW: 17.9 % — ABNORMAL HIGH (ref 11.5–15.5)
WBC Count: 4.7 10*3/uL (ref 4.0–10.5)
nRBC: 0 % (ref 0.0–0.2)

## 2020-01-20 LAB — TOTAL PROTEIN, URINE DIPSTICK: Protein, ur: 100 mg/dL — AB

## 2020-01-20 MED ORDER — FAMOTIDINE IN NACL 20-0.9 MG/50ML-% IV SOLN
INTRAVENOUS | Status: AC
  Filled 2020-01-20: qty 50

## 2020-01-20 MED ORDER — SODIUM CHLORIDE 0.9 % IV SOLN
Freq: Once | INTRAVENOUS | Status: AC
  Filled 2020-01-20: qty 250

## 2020-01-20 MED ORDER — HEPARIN SOD (PORK) LOCK FLUSH 100 UNIT/ML IV SOLN
500.0000 [IU] | Freq: Once | INTRAVENOUS | Status: AC | PRN
  Administered 2020-01-20: 500 [IU]
  Filled 2020-01-20: qty 5

## 2020-01-20 MED ORDER — SODIUM CHLORIDE 0.9% FLUSH
10.0000 mL | INTRAVENOUS | Status: DC | PRN
  Administered 2020-01-20: 10 mL
  Filled 2020-01-20: qty 10

## 2020-01-20 MED ORDER — SODIUM CHLORIDE 0.9% FLUSH
10.0000 mL | Freq: Once | INTRAVENOUS | Status: AC
  Administered 2020-01-20: 10 mL
  Filled 2020-01-20: qty 10

## 2020-01-20 MED ORDER — DIPHENHYDRAMINE HCL 50 MG/ML IJ SOLN
INTRAMUSCULAR | Status: AC
  Filled 2020-01-20: qty 1

## 2020-01-20 MED ORDER — SODIUM CHLORIDE 0.9 % IV SOLN
8.0000 mg/kg | Freq: Once | INTRAVENOUS | Status: AC
  Administered 2020-01-20: 800 mg via INTRAVENOUS
  Filled 2020-01-20: qty 50

## 2020-01-20 MED ORDER — DIPHENHYDRAMINE HCL 50 MG/ML IJ SOLN
25.0000 mg | Freq: Once | INTRAMUSCULAR | Status: AC
  Administered 2020-01-20: 25 mg via INTRAVENOUS

## 2020-01-20 MED ORDER — SODIUM CHLORIDE 0.9 % IV SOLN
60.0000 mg/m2 | Freq: Once | INTRAVENOUS | Status: AC
  Administered 2020-01-20: 126 mg via INTRAVENOUS
  Filled 2020-01-20: qty 21

## 2020-01-20 MED ORDER — SODIUM CHLORIDE 0.9 % IV SOLN
10.0000 mg | Freq: Once | INTRAVENOUS | Status: AC
  Administered 2020-01-20: 10 mg via INTRAVENOUS
  Filled 2020-01-20: qty 10

## 2020-01-20 MED ORDER — FAMOTIDINE IN NACL 20-0.9 MG/50ML-% IV SOLN
20.0000 mg | Freq: Once | INTRAVENOUS | Status: AC
  Administered 2020-01-20: 20 mg via INTRAVENOUS

## 2020-01-20 NOTE — Progress Notes (Signed)
Nutrition Follow-up:  Patient with gastric cancer, receiving chemotherapy.   Met with patient during infusion.  Patient reports that appetite is good and she is eating.  Concerned about weight loss.  Reports that she is eating oatmeal (instant 1 packet) for breakfast. Lunch is oodles and noodles and dinner is meat and vegetable and starch.  Asking about juice type shakes.    Reports 1 episode of vomiting. Bowels are moving normally.    Medications: reviewed  Labs: reviewed  Anthropometrics:   Weight 200 lb 4.8 oz today decreased from 204 lb 11.2 ov on 8/10 when last seen by RD  203 lb on 9/21   NUTRITION DIAGNOSIS: Unintentional weight loss continues   INTERVENTION:  Discussed importance of choosing high calories foods at every meal. Discussed foods she could add to current eating pattern to give her more calories to prevent weight loss.  Provided samples of boost soothe and ensure clear  MONITORING, EVALUATION, GOAL: weight trends, intake   NEXT VISIT: to be determined with treatment  Asberry Lascola B. Zenia Resides, [place], [place] Registered Dietitian [phone] (mobile)

## 2020-01-20 NOTE — Progress Notes (Signed)
Per Ned Card NP, ok to proceed with Cyramza with urine protein 100.

## 2020-01-20 NOTE — Patient Instructions (Signed)

## 2020-01-20 NOTE — Progress Notes (Signed)
[name] OFFICE PROGRESS NOTE   Diagnosis: Gastric cancer  INTERVAL HISTORY:   [name] returns as scheduled.  She completed another cycle of ramucirumab/paclitaxel 01/06/2020.  She feels well.  No change in baseline neuropathy symptoms.  She noted less nausea/vomiting following the most recent cycle of treatment.  No diarrhea or constipation.  She has a good appetite.  She denies pain.  Objective:  Vital signs in last 24 hours:  Blood pressure 119/65, pulse 85, temperature (!) 97.2 F (36.2 C), temperature source Tympanic, resp. rate 17, height 5' 4" (1.626 m), weight 200 lb 4.8 oz (90.9 kg), SpO2 100 %.    HEENT: No thrush or ulcers. Resp: Lungs clear bilaterally. Cardio: Regular rate and rhythm. GI: Abdomen soft and nontender.  No hepatomegaly. Vascular: No leg edema. Port-A-Cath without erythema.  Lab Results:  Lab Results  Component Value Date   WBC 4.7 01/20/2020   HGB 9.3 (L) 01/20/2020   HCT 29.4 (L) 01/20/2020   MCV 87.0 01/20/2020   PLT 283 01/20/2020   NEUTROABS 2.6 01/20/2020    Imaging:  No results found.  Medications: I have reviewed the patient's current medications.  Assessment/Plan: 1. Gastric cancer ? Presenting with dysphagia spring 2019 ? 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 CTs revealed wall thickening and luminal narrowing of the colon at the hepatic flexure and cecum ? PET scan with no evidence of distant metastatic disease or abnormal uptake other than thickening of the distal esophagus and proximal stomach ? 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 CTs 10/23/2017-diffuse prominence of the gastric wall, especially the antrum, focal wall thickening of the distal ascending colon and hepatic flexure, thickening of the cecum, nonspecific haziness of the mesenteric fat in the pelvis, mild prominence of lymph nodes at the greater curvature of the stomach ? Treatment with FOLFOX beginning July 2019 ? CTs October 2019-stable disease, FOLFOX continued ? CTs December 2019-stable disease ? January 2020 treatment transition to Xeloda maintenance, care transition to [place] ? CTs in June 2020 in September 2020-stable disease, Xeloda continued ? CTs 06/23/2019-nonspecific thickening of the GE junction, intraluminal bladder mass ? Cystoscopy-metastatic signet ring cell adenocarcinoma ? Cycle 1 day 1 ramucirumab/paclitaxel 08/19/2019(given atanother facility) ? Cycle 1 day 1 ramucirumab/paclitaxel 09/04/2019 ? Cycle 1 day 8 Taxol 09/10/19 ? Cycle 1 day 15 ramucirumab/paclitaxel 6/2/2021canceled d/t neutropenia ? Ramucirumab/pactlitaxelevery 2 weeks 09/24/2019 ? Ramucirumab/paclitaxel 10/08/2019 ? Ramucirumab/paclitaxel 10/22/2019 ? Ramucirumab/paclitaxel 11/05/2019 ? CTs 11/24/2019-unchanged thickening of the distal esophagus/upper stomach, gastric body and antrum. Unchanged thickening of the transverse colon at the hepatic flexure, unchanged thickening of the urinary bladder, no evidence of progressive disease ? Ramucirumab/paclitaxel 11/25/2019 ? Ramucirumab/paclitaxel 12/09/2019 ? Ramucirumab/paclitaxel 12/23/2019 ? Ramucirumab/paclitaxel 01/06/2020 ? Ramucirumab/paclitaxel 01/20/2020  2.Left breast cancer 2008,pT1c,pN0, status post a left lumpectomy with adjuvant  chemotherapy and radiation, ER positive, PR positive, HER-2 positive  3.Mixed connective tissue disease/SLE  4.Lower extremity deep vein thrombosis maintained on apixaban  5.Family history of multiple cancers including breast  and ovarian cancer  6.Dysphagia and intermittent vomiting secondary to #1  7.Hypertension  8.Peripheral neuropathy  9.Masslike fullness at the posterior right parotid/angle of the jaw  10. Dyspnea on exertion, ongoing for about a month, worsened after taxol/ramucirumab on 09/04/19 requiring ED visit on 5/23. CBC, CMP, troponin, BNP and chest xray negative  11.Mucositis secondary to chemotherapy 09/17/2019  Disposition: Ms.  appears stable.  There is no clinical evidence of disease progression.  Plan to proceed with ramucirumab/Taxol today as scheduled.  We reviewed the CBC from today.  Counts adequate to proceed with treatment.  She will return for lab, follow-up, treatment in 2 weeks.      ANP/GNP-BC   01/20/2020  12:12 PM        

## 2020-01-21 ENCOUNTER — Telehealth: Payer: Self-pay | Admitting: Nurse Practitioner

## 2020-01-21 ENCOUNTER — Ambulatory Visit: Payer: Medicare Other | Admitting: Oncology

## 2020-01-21 ENCOUNTER — Other Ambulatory Visit: Payer: Medicare Other

## 2020-01-21 ENCOUNTER — Ambulatory Visit: Payer: Medicare Other

## 2020-01-21 NOTE — Telephone Encounter (Signed)
Scheduled appointments per 10/5 los. Spoke to patient who is aware of appointments date and times.

## 2020-01-29 ENCOUNTER — Other Ambulatory Visit: Payer: Self-pay | Admitting: Oncology

## 2020-02-04 ENCOUNTER — Inpatient Hospital Stay: Payer: Medicare Other

## 2020-02-04 ENCOUNTER — Ambulatory Visit: Payer: Medicare Other | Admitting: Oncology

## 2020-02-04 ENCOUNTER — Inpatient Hospital Stay (HOSPITAL_BASED_OUTPATIENT_CLINIC_OR_DEPARTMENT_OTHER): Payer: Medicare Other | Admitting: Nurse Practitioner

## 2020-02-04 ENCOUNTER — Other Ambulatory Visit: Payer: Self-pay

## 2020-02-04 ENCOUNTER — Encounter: Payer: Self-pay | Admitting: Nurse Practitioner

## 2020-02-04 VITALS — BP 113/63 | HR 87 | Temp 97.6°F | Resp 18 | Ht 64.0 in | Wt 203.5 lb

## 2020-02-04 DIAGNOSIS — Z5112 Encounter for antineoplastic immunotherapy: Secondary | ICD-10-CM | POA: Diagnosis not present

## 2020-02-04 DIAGNOSIS — C169 Malignant neoplasm of stomach, unspecified: Secondary | ICD-10-CM | POA: Diagnosis not present

## 2020-02-04 DIAGNOSIS — R5383 Other fatigue: Secondary | ICD-10-CM

## 2020-02-04 DIAGNOSIS — Z5111 Encounter for antineoplastic chemotherapy: Secondary | ICD-10-CM

## 2020-02-04 DIAGNOSIS — Z95828 Presence of other vascular implants and grafts: Secondary | ICD-10-CM

## 2020-02-04 LAB — CMP (CANCER CENTER ONLY)
ALT: 6 U/L (ref 0–44)
AST: 13 U/L — ABNORMAL LOW (ref 15–41)
Albumin: 3.1 g/dL — ABNORMAL LOW (ref 3.5–5.0)
Alkaline Phosphatase: 71 U/L (ref 38–126)
Anion gap: 6 (ref 5–15)
BUN: 11 mg/dL (ref 6–20)
CO2: 26 mmol/L (ref 22–32)
Calcium: 8.8 mg/dL — ABNORMAL LOW (ref 8.9–10.3)
Chloride: 106 mmol/L (ref 98–111)
Creatinine: 0.83 mg/dL (ref 0.44–1.00)
GFR, Estimated: >60 mL/min (ref >60)
Glucose, Bld: 88 mg/dL (ref 70–99)
Potassium: 3.9 mmol/L (ref 3.5–5.1)
Sodium: 138 mmol/L (ref 135–145)
Total Bilirubin: <0.2 mg/dL — ABNORMAL LOW (ref 0.3–1.2)
Total Protein: 6.5 g/dL (ref 6.5–8.1)

## 2020-02-04 LAB — CBC WITH DIFFERENTIAL (CANCER CENTER ONLY)
Abs Immature Granulocytes: 0.01 10*3/uL (ref 0.00–0.07)
Basophils Absolute: 0 10*3/uL (ref 0.0–0.1)
Basophils Relative: 0 %
Eosinophils Absolute: 0 10*3/uL (ref 0.0–0.5)
Eosinophils Relative: 1 %
HCT: 28.1 % — ABNORMAL LOW (ref 36.0–46.0)
Hemoglobin: 8.8 g/dL — ABNORMAL LOW (ref 12.0–15.0)
Immature Granulocytes: 0 %
Lymphocytes Relative: 35 %
Lymphs Abs: 1.2 10*3/uL (ref 0.7–4.0)
MCH: 26.9 pg (ref 26.0–34.0)
MCHC: 31.3 g/dL (ref 30.0–36.0)
MCV: 85.9 fL (ref 80.0–100.0)
Monocytes Absolute: 0.6 10*3/uL (ref 0.1–1.0)
Monocytes Relative: 17 %
Neutro Abs: 1.7 10*3/uL (ref 1.7–7.7)
Neutrophils Relative %: 47 %
Platelet Count: 283 10*3/uL (ref 150–400)
RBC: 3.27 MIL/uL — ABNORMAL LOW (ref 3.87–5.11)
RDW: 18.6 % — ABNORMAL HIGH (ref 11.5–15.5)
WBC Count: 3.6 10*3/uL — ABNORMAL LOW (ref 4.0–10.5)
nRBC: 0 % (ref 0.0–0.2)

## 2020-02-04 LAB — TOTAL PROTEIN, URINE DIPSTICK: Protein, ur: 100 mg/dL — AB

## 2020-02-04 LAB — TSH: TSH: 5.393 u[IU]/mL — ABNORMAL HIGH (ref 0.308–3.960)

## 2020-02-04 MED ORDER — SODIUM CHLORIDE 0.9% FLUSH
10.0000 mL | Freq: Once | INTRAVENOUS | Status: AC
  Administered 2020-02-04: 10 mL
  Filled 2020-02-04: qty 10

## 2020-02-04 MED ORDER — DIPHENHYDRAMINE HCL 50 MG/ML IJ SOLN
INTRAMUSCULAR | Status: AC
  Filled 2020-02-04: qty 1

## 2020-02-04 MED ORDER — SODIUM CHLORIDE 0.9 % IV SOLN
Freq: Once | INTRAVENOUS | Status: AC
  Filled 2020-02-04: qty 250

## 2020-02-04 MED ORDER — SODIUM CHLORIDE 0.9% FLUSH
10.0000 mL | INTRAVENOUS | Status: DC | PRN
  Administered 2020-02-04: 10 mL
  Filled 2020-02-04: qty 10

## 2020-02-04 MED ORDER — FAMOTIDINE IN NACL 20-0.9 MG/50ML-% IV SOLN
20.0000 mg | Freq: Once | INTRAVENOUS | Status: AC
  Administered 2020-02-04: 20 mg via INTRAVENOUS

## 2020-02-04 MED ORDER — SODIUM CHLORIDE 0.9 % IV SOLN
10.0000 mg | Freq: Once | INTRAVENOUS | Status: AC
  Administered 2020-02-04: 10 mg via INTRAVENOUS
  Filled 2020-02-04: qty 10

## 2020-02-04 MED ORDER — DIPHENHYDRAMINE HCL 50 MG/ML IJ SOLN
25.0000 mg | Freq: Once | INTRAMUSCULAR | Status: AC
  Administered 2020-02-04: 25 mg via INTRAVENOUS

## 2020-02-04 MED ORDER — FAMOTIDINE IN NACL 20-0.9 MG/50ML-% IV SOLN
INTRAVENOUS | Status: AC
  Filled 2020-02-04: qty 50

## 2020-02-04 MED ORDER — SODIUM CHLORIDE 0.9 % IV SOLN
60.0000 mg/m2 | Freq: Once | INTRAVENOUS | Status: AC
  Administered 2020-02-04: 126 mg via INTRAVENOUS
  Filled 2020-02-04: qty 21

## 2020-02-04 MED ORDER — SODIUM CHLORIDE 0.9 % IV SOLN
8.0000 mg/kg | Freq: Once | INTRAVENOUS | Status: AC
  Administered 2020-02-04: 800 mg via INTRAVENOUS
  Filled 2020-02-04: qty 30

## 2020-02-04 MED ORDER — HEPARIN SOD (PORK) LOCK FLUSH 100 UNIT/ML IV SOLN
500.0000 [IU] | Freq: Once | INTRAVENOUS | Status: AC | PRN
  Administered 2020-02-04: 500 [IU]
  Filled 2020-02-04: qty 5

## 2020-02-04 NOTE — Progress Notes (Signed)
[place] OFFICE PROGRESS NOTE   Diagnosis:  Gastric cancer  INTERVAL HISTORY:   [name] returns as scheduled.  She completed another cycle of ramucirumab/paclitaxel 01/20/2020.  She denies significant nausea/vomiting.  No mouth sores.  No diarrhea or constipation.  She continues to have intermittent hematuria.  She had a self-limited nosebleed last week.  No other bleeding.  Last week she had an episode of upper abdominal/lower chest pain after eating.  Her appetite is good.  She has gained some weight since her last visit.  Fatigue for several days after each treatment.  Otherwise energy level is good.  Stable neuropathy symptoms.  Objective:  Vital signs in last 24 hours:  Blood pressure 113/63, pulse 87, temperature 97.6 F (36.4 C), temperature source Tympanic, resp. rate 18, height _0  (1.626 m), weight 203 lb 8 oz (92.3 kg), SpO2 100 %.    HEENT: No thrush or ulcers. Resp: Lungs clear bilaterally. Cardio: Regular rate and rhythm. GI: Abdomen soft and nontender.  No hepatomegaly. Vascular: No leg edema. Skin: Palms without erythema. Port-A-Cath without erythema.   Lab Results:  Lab Results  Component Value Date   WBC 3.6 (L) 02/04/2020   HGB 8.8 (L) 02/04/2020   HCT 28.1 (L) 02/04/2020   MCV 85.9 02/04/2020   PLT 283 02/04/2020   NEUTROABS 1.7 02/04/2020    Imaging:  No results found.  Medications: I have reviewed the patient's current medications.  Assessment/Plan: 1. Gastric cancer ? Presenting with dysphagia spring 2019 ? 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 CTs revealed wall thickening and luminal narrowing of the colon at the hepatic  flexure and cecum ? PET scan with no evidence of distant metastatic disease or abnormal uptake other than thickening of the distal esophagus and proximal stomach ? 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 CTs 10/23/2017-diffuse prominence of the gastric wall, especially the antrum, focal wall thickening of the distal ascending colon and hepatic flexure, thickening of the cecum, nonspecific haziness of the mesenteric fat in the pelvis, mild prominence of lymph nodes at the greater curvature of the stomach ? Treatment with FOLFOX beginning July 2019 ? CTs October 2019-stable disease, FOLFOX continued ? CTs December 2019-stable disease ? January 2020 treatment transition to Xeloda maintenance, care transition to [place] ? CTs in June 2020 in September 2020-stable disease, Xeloda continued ? CTs 06/23/2019-nonspecific thickening of the GE junction, intraluminal bladder mass ? Cystoscopy-metastatic signet ring cell adenocarcinoma ? Cycle 1 day 1 ramucirumab/paclitaxel 08/19/2019(given atanother facility) ? Cycle 1 day 1 ramucirumab/paclitaxel 09/04/2019 ? Cycle 1 day 8 Taxol 09/10/19 ? Cycle 1 day 15 ramucirumab/paclitaxel 6/2/2021canceled d/t neutropenia ? Ramucirumab/pactlitaxelevery 2 weeks 09/24/2019 ? Ramucirumab/paclitaxel 10/08/2019 ? Ramucirumab/paclitaxel 10/22/2019 ? Ramucirumab/paclitaxel 11/05/2019 ? CTs 11/24/2019-unchanged thickening of the distal esophagus/upper stomach, gastric body and antrum. Unchanged thickening of the transverse colon at the hepatic flexure, unchanged thickening of the urinary bladder, no evidence of  progressive disease ? Ramucirumab/paclitaxel 11/25/2019 ? Ramucirumab/paclitaxel 12/09/2019 ? Ramucirumab/paclitaxel 12/23/2019 ?  Ramucirumab/paclitaxel9/21/2021 ? Ramucirumab/paclitaxel 01/20/2020 ? Ramucirumab/paclitaxel 02/04/2020  2.Left breast cancer 2008,pT1c,pN0, status post a left lumpectomy with adjuvant chemotherapy and radiation, ER positive, PR positive, HER-2 positive  3.Mixed connective tissue disease/SLE  4.Lower extremity deep vein thrombosis maintained on apixaban  5.Family history of multiple cancers including breast and ovarian cancer  6.Dysphagia and intermittent vomiting secondary to #1  7.Hypertension  8.Peripheral neuropathy  9.Masslike fullness at the posterior right parotid/angle of the jaw  10. Dyspnea on exertion, ongoing for about a month, worsened after taxol/ramucirumab on 09/04/19 requiring ED visit on 5/23. CBC, CMP, troponin, BNP and chest xray negative   Disposition: [name] appears unchanged.  There is no clinical evidence of disease progression.  Plan to continue every 2-week ramucirumab/Taxol.  We reviewed the CBC from today.  Counts adequate to proceed with treatment.  Hemoglobin is slowly declining.  She appears asymptomatic.  We will continue to monitor.  She will return for lab, follow-up, treatment in 2 weeks.  She will contact the office in the interim with any problems.    Ned Card ANP/GNP-BC   02/04/2020  9:23 AM

## 2020-02-04 NOTE — Patient Instructions (Signed)
[facility] Discharge Instructions for Patients Receiving Chemotherapy  Today you received the following chemotherapy agents Cyramza and Taxol  To help prevent nausea and vomiting after your treatment, we encourage you to take your nausea medication as directed.    If you develop nausea and vomiting that is not controlled by your nausea medication, call the clinic.   BELOW ARE SYMPTOMS THAT SHOULD BE REPORTED IMMEDIATELY:  *FEVER GREATER THAN 100.5 F  *CHILLS WITH OR WITHOUT FEVER  NAUSEA AND VOMITING THAT IS NOT CONTROLLED WITH YOUR NAUSEA MEDICATION  *UNUSUAL SHORTNESS OF BREATH  *UNUSUAL BRUISING OR BLEEDING  TENDERNESS IN MOUTH AND THROAT WITH OR WITHOUT PRESENCE OF ULCERS  *URINARY PROBLEMS  *BOWEL PROBLEMS  UNUSUAL RASH Items with * indicate a potential emergency and should be followed up as soon as possible.  Feel free to call the clinic should you have any questions or concerns. The clinic phone number is [phone].  Please show the CHEMO ALERT CARD at check-in to the Emergency Department and triage nurse.   

## 2020-02-04 NOTE — Progress Notes (Signed)
Per Ned Card, NP it is ok to treat today with urine protein 100

## 2020-02-05 ENCOUNTER — Telehealth: Payer: Self-pay | Admitting: Nurse Practitioner

## 2020-02-05 NOTE — Telephone Encounter (Signed)
Scheduled appointments per 10/20 los. Spoke to patient who is aware of appointments dates and times.

## 2020-02-12 ENCOUNTER — Other Ambulatory Visit: Payer: Self-pay | Admitting: Oncology

## 2020-02-17 ENCOUNTER — Encounter: Payer: Self-pay | Admitting: Nurse Practitioner

## 2020-02-17 ENCOUNTER — Inpatient Hospital Stay (HOSPITAL_BASED_OUTPATIENT_CLINIC_OR_DEPARTMENT_OTHER): Payer: Medicare Other | Admitting: Nurse Practitioner

## 2020-02-17 ENCOUNTER — Inpatient Hospital Stay: Payer: Medicare Other

## 2020-02-17 ENCOUNTER — Other Ambulatory Visit: Payer: Self-pay

## 2020-02-17 ENCOUNTER — Inpatient Hospital Stay: Payer: Medicare Other | Attending: Oncology

## 2020-02-17 VITALS — BP 104/59 | HR 87 | Temp 97.0°F | Resp 16 | Ht 64.0 in | Wt 200.1 lb

## 2020-02-17 DIAGNOSIS — I10 Essential (primary) hypertension: Secondary | ICD-10-CM | POA: Insufficient documentation

## 2020-02-17 DIAGNOSIS — C169 Malignant neoplasm of stomach, unspecified: Secondary | ICD-10-CM | POA: Diagnosis present

## 2020-02-17 DIAGNOSIS — Z5112 Encounter for antineoplastic immunotherapy: Secondary | ICD-10-CM | POA: Diagnosis present

## 2020-02-17 DIAGNOSIS — D649 Anemia, unspecified: Secondary | ICD-10-CM | POA: Diagnosis not present

## 2020-02-17 DIAGNOSIS — Z95828 Presence of other vascular implants and grafts: Secondary | ICD-10-CM

## 2020-02-17 DIAGNOSIS — G629 Polyneuropathy, unspecified: Secondary | ICD-10-CM | POA: Insufficient documentation

## 2020-02-17 DIAGNOSIS — Z853 Personal history of malignant neoplasm of breast: Secondary | ICD-10-CM | POA: Insufficient documentation

## 2020-02-17 DIAGNOSIS — Z5111 Encounter for antineoplastic chemotherapy: Secondary | ICD-10-CM | POA: Diagnosis present

## 2020-02-17 LAB — CBC WITH DIFFERENTIAL (CANCER CENTER ONLY)
Abs Immature Granulocytes: 0.01 10*3/uL (ref 0.00–0.07)
Basophils Absolute: 0 10*3/uL (ref 0.0–0.1)
Basophils Relative: 0 %
Eosinophils Absolute: 0 10*3/uL (ref 0.0–0.5)
Eosinophils Relative: 0 %
HCT: 26.6 % — ABNORMAL LOW (ref 36.0–46.0)
Hemoglobin: 8.3 g/dL — ABNORMAL LOW (ref 12.0–15.0)
Immature Granulocytes: 0 %
Lymphocytes Relative: 28 %
Lymphs Abs: 1.2 10*3/uL (ref 0.7–4.0)
MCH: 26.5 pg (ref 26.0–34.0)
MCHC: 31.2 g/dL (ref 30.0–36.0)
MCV: 85 fL (ref 80.0–100.0)
Monocytes Absolute: 0.7 10*3/uL (ref 0.1–1.0)
Monocytes Relative: 15 %
Neutro Abs: 2.6 10*3/uL (ref 1.7–7.7)
Neutrophils Relative %: 57 %
Platelet Count: 254 10*3/uL (ref 150–400)
RBC: 3.13 MIL/uL — ABNORMAL LOW (ref 3.87–5.11)
RDW: 19.2 % — ABNORMAL HIGH (ref 11.5–15.5)
WBC Count: 4.5 10*3/uL (ref 4.0–10.5)
nRBC: 0 % (ref 0.0–0.2)

## 2020-02-17 LAB — CMP (CANCER CENTER ONLY)
ALT: 8 U/L (ref 0–44)
AST: 14 U/L — ABNORMAL LOW (ref 15–41)
Albumin: 3.1 g/dL — ABNORMAL LOW (ref 3.5–5.0)
Alkaline Phosphatase: 60 U/L (ref 38–126)
Anion gap: 7 (ref 5–15)
BUN: 13 mg/dL (ref 6–20)
CO2: 24 mmol/L (ref 22–32)
Calcium: 8 mg/dL — ABNORMAL LOW (ref 8.9–10.3)
Chloride: 106 mmol/L (ref 98–111)
Creatinine: 0.8 mg/dL (ref 0.44–1.00)
GFR, Estimated: >60 mL/min (ref >60)
Glucose, Bld: 105 mg/dL — ABNORMAL HIGH (ref 70–99)
Potassium: 3.5 mmol/L (ref 3.5–5.1)
Sodium: 137 mmol/L (ref 135–145)
Total Bilirubin: 0.4 mg/dL (ref 0.3–1.2)
Total Protein: 6.1 g/dL — ABNORMAL LOW (ref 6.5–8.1)

## 2020-02-17 MED ORDER — FAMOTIDINE IN NACL 20-0.9 MG/50ML-% IV SOLN
20.0000 mg | Freq: Once | INTRAVENOUS | Status: AC
  Administered 2020-02-17: 20 mg via INTRAVENOUS

## 2020-02-17 MED ORDER — SODIUM CHLORIDE 0.9 % IV SOLN
8.0000 mg/kg | Freq: Once | INTRAVENOUS | Status: AC
  Administered 2020-02-17: 800 mg via INTRAVENOUS
  Filled 2020-02-17: qty 50

## 2020-02-17 MED ORDER — HEPARIN SOD (PORK) LOCK FLUSH 100 UNIT/ML IV SOLN
500.0000 [IU] | Freq: Once | INTRAVENOUS | Status: AC | PRN
  Administered 2020-02-17: 500 [IU]
  Filled 2020-02-17: qty 5

## 2020-02-17 MED ORDER — SODIUM CHLORIDE 0.9 % IV SOLN
10.0000 mg | Freq: Once | INTRAVENOUS | Status: AC
  Administered 2020-02-17: 10 mg via INTRAVENOUS
  Filled 2020-02-17: qty 10

## 2020-02-17 MED ORDER — SODIUM CHLORIDE 0.9 % IV SOLN
Freq: Once | INTRAVENOUS | Status: AC
  Filled 2020-02-17: qty 250

## 2020-02-17 MED ORDER — SODIUM CHLORIDE 0.9% FLUSH
10.0000 mL | INTRAVENOUS | Status: DC | PRN
  Administered 2020-02-17: 10 mL
  Filled 2020-02-17: qty 10

## 2020-02-17 MED ORDER — DIPHENHYDRAMINE HCL 50 MG/ML IJ SOLN
INTRAMUSCULAR | Status: AC
  Filled 2020-02-17: qty 1

## 2020-02-17 MED ORDER — SODIUM CHLORIDE 0.9 % IV SOLN
60.0000 mg/m2 | Freq: Once | INTRAVENOUS | Status: AC
  Administered 2020-02-17: 126 mg via INTRAVENOUS
  Filled 2020-02-17: qty 21

## 2020-02-17 MED ORDER — SODIUM CHLORIDE 0.9% FLUSH
10.0000 mL | Freq: Once | INTRAVENOUS | Status: AC
  Administered 2020-02-17: 10 mL
  Filled 2020-02-17: qty 10

## 2020-02-17 MED ORDER — DIPHENHYDRAMINE HCL 50 MG/ML IJ SOLN
25.0000 mg | Freq: Once | INTRAMUSCULAR | Status: AC
  Administered 2020-02-17: 25 mg via INTRAVENOUS

## 2020-02-17 MED ORDER — FAMOTIDINE IN NACL 20-0.9 MG/50ML-% IV SOLN
INTRAVENOUS | Status: AC
  Filled 2020-02-17: qty 50

## 2020-02-17 NOTE — Patient Instructions (Signed)
[facility] Discharge Instructions for Patients Receiving Chemotherapy  Today you received the following chemotherapy agents Cyramza and Taxol  To help prevent nausea and vomiting after your treatment, we encourage you to take your nausea medication as directed.    If you develop nausea and vomiting that is not controlled by your nausea medication, call the clinic.   BELOW ARE SYMPTOMS THAT SHOULD BE REPORTED IMMEDIATELY:  *FEVER GREATER THAN 100.5 F  *CHILLS WITH OR WITHOUT FEVER  NAUSEA AND VOMITING THAT IS NOT CONTROLLED WITH YOUR NAUSEA MEDICATION  *UNUSUAL SHORTNESS OF BREATH  *UNUSUAL BRUISING OR BLEEDING  TENDERNESS IN MOUTH AND THROAT WITH OR WITHOUT PRESENCE OF ULCERS  *URINARY PROBLEMS  *BOWEL PROBLEMS  UNUSUAL RASH Items with * indicate a potential emergency and should be followed up as soon as possible.  Feel free to call the clinic should you have any questions or concerns. The clinic phone number is [phone].  Please show the CHEMO ALERT CARD at check-in to the Emergency Department and triage nurse.   

## 2020-02-17 NOTE — Progress Notes (Addendum)
[place] OFFICE PROGRESS NOTE   Diagnosis: Gastric cancer  INTERVAL HISTORY:   [name] returns as scheduled.  She completed another cycle of ramucirumab/paclitaxel 02/04/2020.  No more episodes of abdominal pain after eating.  For the past 2 days she has been vomiting fairly quickly after oral intake, food undigested.  She does not feel nauseated when this occurs.  She has adjusted the way she has been taking Reglan and did not vomit after eating oatmeal this morning.  She has not seen any blood.  She continues to have hematuria intermittently.  No diarrhea or constipation.  Stable neuropathy symptoms.  Objective:  Vital signs in last 24 hours:  Blood pressure (!) 104/59, pulse 87, temperature (!) 97 F (36.1 C), temperature source Tympanic, resp. rate 16, height _0  (1.626 m), weight 200 lb 1.6 oz (90.8 kg), SpO2 100 %.    HEENT: No thrush or ulcers. Resp: Lungs clear bilaterally. Cardio: Regular rate and rhythm. GI: Abdomen soft and nontender.  No hepatomegaly.  No mass. Vascular: No leg edema.  Port-A-Cath without erythema.  Lab Results:  Lab Results  Component Value Date   WBC 4.5 02/17/2020   HGB 8.3 (L) 02/17/2020   HCT 26.6 (L) 02/17/2020   MCV 85.0 02/17/2020   PLT 254 02/17/2020   NEUTROABS 2.6 02/17/2020    Imaging:  No results found.  Medications: I have reviewed the patient's current medications.  Assessment/Plan: 1. Gastric cancer ? Presenting with dysphagia spring 2019 ? 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 CTs revealed wall thickening and luminal narrowing of the colon at the hepatic flexure and cecum ? PET scan with no evidence  of distant metastatic disease or abnormal uptake other than thickening of the distal esophagus and proximal stomach ? 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 CTs 10/23/2017-diffuse prominence of the gastric wall, especially the antrum, focal wall thickening of the distal ascending colon and hepatic flexure, thickening of the cecum, nonspecific haziness of the mesenteric fat in the pelvis, mild prominence of lymph nodes at the greater curvature of the stomach ? Treatment with FOLFOX beginning July 2019 ? CTs October 2019-stable disease, FOLFOX continued ? CTs December 2019-stable disease ? January 2020 treatment transition to Xeloda maintenance, care transition to [place] ? CTs in June 2020 in September 2020-stable disease, Xeloda continued ? CTs 06/23/2019-nonspecific thickening of the GE junction, intraluminal bladder mass ? Cystoscopy-metastatic signet ring cell adenocarcinoma ? Cycle 1 day 1 ramucirumab/paclitaxel 08/19/2019(given atanother facility) ? Cycle 1 day 1 ramucirumab/paclitaxel 09/04/2019 ? Cycle 1 day 8 Taxol 09/10/19 ? Cycle 1 day 15 ramucirumab/paclitaxel 6/2/2021canceled d/t neutropenia ? Ramucirumab/pactlitaxelevery 2 weeks 09/24/2019 ? Ramucirumab/paclitaxel 10/08/2019 ? Ramucirumab/paclitaxel 10/22/2019 ? Ramucirumab/paclitaxel 11/05/2019 ? CTs 11/24/2019-unchanged thickening of the distal esophagus/upper stomach, gastric body and antrum. Unchanged thickening of the transverse colon at the hepatic flexure, unchanged thickening of the urinary bladder, no evidence of progressive disease ? Ramucirumab/paclitaxel  11/25/2019 ? Ramucirumab/paclitaxel 12/09/2019 ? Ramucirumab/paclitaxel 12/23/2019 ? Ramucirumab/paclitaxel9/21/2021 ? Ramucirumab/paclitaxel  01/20/2020 ? Ramucirumab/paclitaxel 02/04/2020 ? Ramucirumab/paclitaxel 02/17/2020  2.Left breast cancer 2008,pT1c,pN0, status post a left lumpectomy with adjuvant chemotherapy and radiation, ER positive, PR positive, HER-2 positive  3.Mixed connective tissue disease/SLE  4.Lower extremity deep vein thrombosis maintained on apixaban  5.Family history of multiple cancers including breast and ovarian cancer  6.Dysphagia and intermittent vomiting secondary to #1  7.Hypertension  8.Peripheral neuropathy  9.Masslike fullness at the posterior right parotid/angle of the jaw  10. Dyspnea on exertion, ongoing for about a month, worsened after taxol/ramucirumab on 09/04/19 requiring ED visit on 5/23. CBC, CMP, troponin, BNP and chest xray negative  11.  Anemia likely secondary to combination of chemotherapy and blood loss   Disposition: [name] appears stable.  Plan to proceed with Taxol/Cyramza today as scheduled.  We reviewed the CBC and chemistry panel from today.  Labs adequate to proceed with treatment.  She has progressive anemia, remains asymptomatic.  We discussed signs and symptoms of anemia.  She understands to contact the office if she develops any of these.  For the past 2 days she has had vomiting early consistently after eating.  We discussed the possibility of a gastric outlet obstruction.  She has adjusted how she is taking Reglan and this morning noted improvement.  She will contact the office if the vomiting persists despite this change.  She will return for lab, follow-up, treatment in 2 weeks.  We are available to see her sooner if needed.  Patient seen with Dr. Benay Spice.     Ned Card ANP/GNP-BC   02/17/2020  9:00 AM This was a shared visit with Ned Card.  [name] appears stable.   She has chronic intermittent episodes of vomiting, likely related to tumor in the stomach.  She will be referred for imaging if she has a persistent increase in the vomiting.  Julieanne Manson, MD

## 2020-02-18 ENCOUNTER — Telehealth: Payer: Self-pay | Admitting: Nurse Practitioner

## 2020-02-18 NOTE — Telephone Encounter (Signed)
Scheduled appointment per 11/2 los. Will have updated calendar printed for patient at next visit.

## 2020-02-29 ENCOUNTER — Other Ambulatory Visit: Payer: Self-pay | Admitting: Oncology

## 2020-03-02 ENCOUNTER — Inpatient Hospital Stay: Payer: Medicare Other

## 2020-03-02 ENCOUNTER — Inpatient Hospital Stay (HOSPITAL_BASED_OUTPATIENT_CLINIC_OR_DEPARTMENT_OTHER): Payer: Medicare Other | Admitting: Nurse Practitioner

## 2020-03-02 ENCOUNTER — Other Ambulatory Visit: Payer: Self-pay

## 2020-03-02 ENCOUNTER — Encounter: Payer: Self-pay | Admitting: Nurse Practitioner

## 2020-03-02 ENCOUNTER — Telehealth: Payer: Self-pay | Admitting: Nurse Practitioner

## 2020-03-02 VITALS — BP 121/77 | HR 91 | Temp 97.6°F | Resp 17 | Ht 64.0 in | Wt 199.1 lb

## 2020-03-02 DIAGNOSIS — C169 Malignant neoplasm of stomach, unspecified: Secondary | ICD-10-CM

## 2020-03-02 DIAGNOSIS — Z95828 Presence of other vascular implants and grafts: Secondary | ICD-10-CM

## 2020-03-02 DIAGNOSIS — Z5112 Encounter for antineoplastic immunotherapy: Secondary | ICD-10-CM | POA: Diagnosis not present

## 2020-03-02 LAB — CMP (CANCER CENTER ONLY)
ALT: 7 U/L (ref 0–44)
AST: 14 U/L — ABNORMAL LOW (ref 15–41)
Albumin: 3.2 g/dL — ABNORMAL LOW (ref 3.5–5.0)
Alkaline Phosphatase: 63 U/L (ref 38–126)
Anion gap: 7 (ref 5–15)
BUN: 14 mg/dL (ref 6–20)
CO2: 25 mmol/L (ref 22–32)
Calcium: 8.4 mg/dL — ABNORMAL LOW (ref 8.9–10.3)
Chloride: 109 mmol/L (ref 98–111)
Creatinine: 0.83 mg/dL (ref 0.44–1.00)
GFR, Estimated: >60 mL/min (ref >60)
Glucose, Bld: 83 mg/dL (ref 70–99)
Potassium: 3.8 mmol/L (ref 3.5–5.1)
Sodium: 141 mmol/L (ref 135–145)
Total Bilirubin: 0.4 mg/dL (ref 0.3–1.2)
Total Protein: 6.6 g/dL (ref 6.5–8.1)

## 2020-03-02 LAB — CBC WITH DIFFERENTIAL (CANCER CENTER ONLY)
Abs Immature Granulocytes: 0.01 10*3/uL (ref 0.00–0.07)
Basophils Absolute: 0 10*3/uL (ref 0.0–0.1)
Basophils Relative: 0 %
Eosinophils Absolute: 0 10*3/uL (ref 0.0–0.5)
Eosinophils Relative: 0 %
HCT: 27.2 % — ABNORMAL LOW (ref 36.0–46.0)
Hemoglobin: 8.4 g/dL — ABNORMAL LOW (ref 12.0–15.0)
Immature Granulocytes: 0 %
Lymphocytes Relative: 38 %
Lymphs Abs: 1.3 10*3/uL (ref 0.7–4.0)
MCH: 26 pg (ref 26.0–34.0)
MCHC: 30.9 g/dL (ref 30.0–36.0)
MCV: 84.2 fL (ref 80.0–100.0)
Monocytes Absolute: 0.5 10*3/uL (ref 0.1–1.0)
Monocytes Relative: 15 %
Neutro Abs: 1.6 10*3/uL — ABNORMAL LOW (ref 1.7–7.7)
Neutrophils Relative %: 47 %
Platelet Count: 303 10*3/uL (ref 150–400)
RBC: 3.23 MIL/uL — ABNORMAL LOW (ref 3.87–5.11)
RDW: 19.9 % — ABNORMAL HIGH (ref 11.5–15.5)
WBC Count: 3.5 10*3/uL — ABNORMAL LOW (ref 4.0–10.5)
nRBC: 0 % (ref 0.0–0.2)

## 2020-03-02 LAB — SAMPLE TO BLOOD BANK

## 2020-03-02 LAB — TOTAL PROTEIN, URINE DIPSTICK: Protein, ur: 100 mg/dL — AB

## 2020-03-02 MED ORDER — DIPHENHYDRAMINE HCL 50 MG/ML IJ SOLN
25.0000 mg | Freq: Once | INTRAMUSCULAR | Status: AC
  Administered 2020-03-02: 25 mg via INTRAVENOUS

## 2020-03-02 MED ORDER — SODIUM CHLORIDE 0.9 % IV SOLN
10.0000 mg | Freq: Once | INTRAVENOUS | Status: AC
  Administered 2020-03-02: 10 mg via INTRAVENOUS
  Filled 2020-03-02: qty 10

## 2020-03-02 MED ORDER — SODIUM CHLORIDE 0.9% FLUSH
10.0000 mL | Freq: Once | INTRAVENOUS | Status: AC
  Administered 2020-03-02: 10 mL
  Filled 2020-03-02: qty 10

## 2020-03-02 MED ORDER — HEPARIN SOD (PORK) LOCK FLUSH 100 UNIT/ML IV SOLN
500.0000 [IU] | Freq: Once | INTRAVENOUS | Status: AC | PRN
  Administered 2020-03-02: 500 [IU]
  Filled 2020-03-02: qty 5

## 2020-03-02 MED ORDER — FAMOTIDINE IN NACL 20-0.9 MG/50ML-% IV SOLN
INTRAVENOUS | Status: AC
  Filled 2020-03-02: qty 50

## 2020-03-02 MED ORDER — SODIUM CHLORIDE 0.9% FLUSH
10.0000 mL | INTRAVENOUS | Status: DC | PRN
  Administered 2020-03-02: 10 mL
  Filled 2020-03-02: qty 10

## 2020-03-02 MED ORDER — DIPHENHYDRAMINE HCL 50 MG/ML IJ SOLN
INTRAMUSCULAR | Status: AC
  Filled 2020-03-02: qty 1

## 2020-03-02 MED ORDER — SODIUM CHLORIDE 0.9 % IV SOLN
Freq: Once | INTRAVENOUS | Status: AC
  Filled 2020-03-02: qty 250

## 2020-03-02 MED ORDER — SODIUM CHLORIDE 0.9 % IV SOLN
8.0000 mg/kg | Freq: Once | INTRAVENOUS | Status: AC
  Administered 2020-03-02: 800 mg via INTRAVENOUS
  Filled 2020-03-02: qty 50

## 2020-03-02 MED ORDER — FAMOTIDINE IN NACL 20-0.9 MG/50ML-% IV SOLN
20.0000 mg | Freq: Once | INTRAVENOUS | Status: AC
  Administered 2020-03-02: 20 mg via INTRAVENOUS

## 2020-03-02 MED ORDER — SODIUM CHLORIDE 0.9 % IV SOLN
60.0000 mg/m2 | Freq: Once | INTRAVENOUS | Status: AC
  Administered 2020-03-02: 126 mg via INTRAVENOUS
  Filled 2020-03-02: qty 21

## 2020-03-02 NOTE — Telephone Encounter (Signed)
Scheduled appointments per 11/16 los. Spoke to patient's mother who is aware of appointments dates and times.

## 2020-03-02 NOTE — Progress Notes (Signed)
Per Ned Card, NP ok to treat with urine protein 100.

## 2020-03-02 NOTE — Patient Instructions (Signed)
[facility] Discharge Instructions for Patients Receiving Chemotherapy  Today you received the following chemotherapy agents Cyramza and Taxol  To help prevent nausea and vomiting after your treatment, we encourage you to take your nausea medication as directed.    If you develop nausea and vomiting that is not controlled by your nausea medication, call the clinic.   BELOW ARE SYMPTOMS THAT SHOULD BE REPORTED IMMEDIATELY:  *FEVER GREATER THAN 100.5 F  *CHILLS WITH OR WITHOUT FEVER  NAUSEA AND VOMITING THAT IS NOT CONTROLLED WITH YOUR NAUSEA MEDICATION  *UNUSUAL SHORTNESS OF BREATH  *UNUSUAL BRUISING OR BLEEDING  TENDERNESS IN MOUTH AND THROAT WITH OR WITHOUT PRESENCE OF ULCERS  *URINARY PROBLEMS  *BOWEL PROBLEMS  UNUSUAL RASH Items with * indicate a potential emergency and should be followed up as soon as possible.  Feel free to call the clinic should you have any questions or concerns. The clinic phone number is [phone].  Please show the CHEMO ALERT CARD at check-in to the Emergency Department and triage nurse.   

## 2020-03-02 NOTE — Patient Instructions (Signed)

## 2020-03-02 NOTE — Progress Notes (Signed)
[place] OFFICE PROGRESS NOTE   Diagnosis: Gastric cancer  INTERVAL HISTORY:   [name] returns as scheduled.  She completed another cycle of Taxol/Cyramza 02/17/2020.  She noted no increase in nausea/vomiting after chemotherapy.  No mouth sores.  No diarrhea.  Postprandial vomiting continues to be improved since adjusting Reglan schedule.  She continues to have intermittent hematuria.  No other bleeding.  Neuropathy symptoms are stable.  She denies pain.  Objective:  Vital signs in last 24 hours:  Blood pressure 121/77, pulse 91, temperature 97.6 F (36.4 C), temperature source Tympanic, resp. rate 17, height _0  (1.626 m), weight 199 lb 1.6 oz (90.3 kg), SpO2 100 %.    HEENT: No thrush or ulcers. Resp: Lungs clear bilaterally. Cardio: Regular rate and rhythm. GI: Abdomen soft and nontender.  No hepatomegaly. Vascular: No leg edema.  Calves soft and nontender. Neuro: Alert and oriented.  Port-A-Cath without erythema.  Lab Results:  Lab Results  Component Value Date   WBC 3.5 (L) 03/02/2020   HGB 8.4 (L) 03/02/2020   HCT 27.2 (L) 03/02/2020   MCV 84.2 03/02/2020   PLT 303 03/02/2020   NEUTROABS 1.6 (L) 03/02/2020    Imaging:  No results found.  Medications: I have reviewed the patient's current medications.  Assessment/Plan: 1. Gastric cancer ? Presenting with dysphagia spring 2019 ? 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 CTs revealed wall thickening and luminal narrowing of the colon at the hepatic flexure and cecum ? PET scan with no evidence of distant metastatic disease or abnormal uptake other than thickening of the distal esophagus and  proximal stomach ? 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 CTs 10/23/2017-diffuse prominence of the gastric wall, especially the antrum, focal wall thickening of the distal ascending colon and hepatic flexure, thickening of the cecum, nonspecific haziness of the mesenteric fat in the pelvis, mild prominence of lymph nodes at the greater curvature of the stomach ? Treatment with FOLFOX beginning July 2019 ? CTs October 2019-stable disease, FOLFOX continued ? CTs December 2019-stable disease ? January 2020 treatment transition to Xeloda maintenance, care transition to [place] ? CTs in June 2020 in September 2020-stable disease, Xeloda continued ? CTs 06/23/2019-nonspecific thickening of the GE junction, intraluminal bladder mass ? Cystoscopy-metastatic signet ring cell adenocarcinoma ? Cycle 1 day 1 ramucirumab/paclitaxel 08/19/2019(given atanother facility) ? Cycle 1 day 1 ramucirumab/paclitaxel 09/04/2019 ? Cycle 1 day 8 Taxol 09/10/19 ? Cycle 1 day 15 ramucirumab/paclitaxel 6/2/2021canceled d/t neutropenia ? Ramucirumab/pactlitaxelevery 2 weeks 09/24/2019 ? Ramucirumab/paclitaxel 10/08/2019 ? Ramucirumab/paclitaxel 10/22/2019 ? Ramucirumab/paclitaxel 11/05/2019 ? CTs 11/24/2019-unchanged thickening of the distal esophagus/upper stomach, gastric body and antrum. Unchanged thickening of the transverse colon at the hepatic flexure, unchanged thickening of the urinary bladder, no evidence of progressive disease ? Ramucirumab/paclitaxel 11/25/2019 ? Ramucirumab/paclitaxel 12/09/2019 ? Ramucirumab/paclitaxel  12/23/2019 ? Ramucirumab/paclitaxel9/21/2021 ? Ramucirumab/paclitaxel 01/20/2020 ? Ramucirumab/paclitaxel 02/04/2020 ? Ramucirumab/paclitaxel 02/17/2020 ? Ramucirumab/paclitaxel 03/02/2020  2.Left breast cancer 2008,pT1c,pN0, status post a left lumpectomy with adjuvant chemotherapy and radiation,  ER positive, PR positive, HER-2 positive  3.Mixed connective tissue disease/SLE  4.Lower extremity deep vein thrombosis maintained on apixaban  5.Family history of multiple cancers including breast and ovarian cancer  6.Dysphagia and intermittent vomiting secondary to #1  7.Hypertension  8.Peripheral neuropathy  9.Masslike fullness at the posterior right parotid/angle of the jaw  10. Dyspnea on exertion, ongoing for about a month, worsened after taxol/ramucirumab on 09/04/19 requiring ED visit on 5/23. CBC, CMP, troponin, BNP and chest xray negative  11.  Anemia likely secondary to combination of chemotherapy and blood loss   Disposition: [name] appears unchanged.  There is no clinical evidence of disease progression.  Plan to continue ramucirumab/paclitaxel every 2 weeks, treatment today.  We reviewed the CBC and chemistry panel from today.  Labs adequate to proceed with treatment.  She will return for lab, follow-up, treatment in 2 weeks.  She will contact the office in the interim with any problems.    Ned Card ANP/GNP-BC   03/02/2020  10:45 AM

## 2020-03-09 ENCOUNTER — Telehealth: Payer: Self-pay | Admitting: *Deleted

## 2020-03-09 MED ORDER — ONDANSETRON HCL 8 MG PO TABS: 8.0000 mg | ORAL_TABLET | Freq: Three times a day (TID) | ORAL | 1 refills | Status: DC | PRN

## 2020-03-09 NOTE — Telephone Encounter (Signed)
Called to request refill on ondansetron.

## 2020-03-13 ENCOUNTER — Other Ambulatory Visit: Payer: Self-pay | Admitting: Oncology

## 2020-03-16 ENCOUNTER — Inpatient Hospital Stay: Payer: Medicare Other

## 2020-03-16 ENCOUNTER — Inpatient Hospital Stay (HOSPITAL_BASED_OUTPATIENT_CLINIC_OR_DEPARTMENT_OTHER): Payer: Medicare Other | Admitting: Oncology

## 2020-03-16 ENCOUNTER — Other Ambulatory Visit: Payer: Self-pay

## 2020-03-16 VITALS — BP 118/73 | HR 90 | Temp 97.7°F | Resp 16 | Ht 64.0 in | Wt 199.5 lb

## 2020-03-16 DIAGNOSIS — C169 Malignant neoplasm of stomach, unspecified: Secondary | ICD-10-CM

## 2020-03-16 DIAGNOSIS — Z5112 Encounter for antineoplastic immunotherapy: Secondary | ICD-10-CM | POA: Diagnosis not present

## 2020-03-16 DIAGNOSIS — Z95828 Presence of other vascular implants and grafts: Secondary | ICD-10-CM

## 2020-03-16 LAB — CBC WITH DIFFERENTIAL (CANCER CENTER ONLY)
Abs Immature Granulocytes: 0.01 10*3/uL (ref 0.00–0.07)
Basophils Absolute: 0 10*3/uL (ref 0.0–0.1)
Basophils Relative: 0 %
Eosinophils Absolute: 0 10*3/uL (ref 0.0–0.5)
Eosinophils Relative: 0 %
HCT: 25.2 % — ABNORMAL LOW (ref 36.0–46.0)
Hemoglobin: 7.7 g/dL — ABNORMAL LOW (ref 12.0–15.0)
Immature Granulocytes: 0 %
Lymphocytes Relative: 32 %
Lymphs Abs: 1.2 10*3/uL (ref 0.7–4.0)
MCH: 25.4 pg — ABNORMAL LOW (ref 26.0–34.0)
MCHC: 30.6 g/dL (ref 30.0–36.0)
MCV: 83.2 fL (ref 80.0–100.0)
Monocytes Absolute: 0.6 10*3/uL (ref 0.1–1.0)
Monocytes Relative: 14 %
Neutro Abs: 2 10*3/uL (ref 1.7–7.7)
Neutrophils Relative %: 54 %
Platelet Count: 281 10*3/uL (ref 150–400)
RBC: 3.03 MIL/uL — ABNORMAL LOW (ref 3.87–5.11)
RDW: 20.4 % — ABNORMAL HIGH (ref 11.5–15.5)
WBC Count: 3.8 10*3/uL — ABNORMAL LOW (ref 4.0–10.5)
nRBC: 0 % (ref 0.0–0.2)

## 2020-03-16 LAB — CMP (CANCER CENTER ONLY)
ALT: 8 U/L (ref 0–44)
AST: 12 U/L — ABNORMAL LOW (ref 15–41)
Albumin: 3 g/dL — ABNORMAL LOW (ref 3.5–5.0)
Alkaline Phosphatase: 61 U/L (ref 38–126)
Anion gap: 7 (ref 5–15)
BUN: 10 mg/dL (ref 6–20)
CO2: 23 mmol/L (ref 22–32)
Calcium: 8.3 mg/dL — ABNORMAL LOW (ref 8.9–10.3)
Chloride: 110 mmol/L (ref 98–111)
Creatinine: 0.83 mg/dL (ref 0.44–1.00)
GFR, Estimated: >60 mL/min (ref >60)
Glucose, Bld: 98 mg/dL (ref 70–99)
Potassium: 3.6 mmol/L (ref 3.5–5.1)
Sodium: 140 mmol/L (ref 135–145)
Total Bilirubin: 0.3 mg/dL (ref 0.3–1.2)
Total Protein: 6.2 g/dL — ABNORMAL LOW (ref 6.5–8.1)

## 2020-03-16 MED ORDER — FAMOTIDINE IN NACL 20-0.9 MG/50ML-% IV SOLN
INTRAVENOUS | Status: AC
  Filled 2020-03-16: qty 50

## 2020-03-16 MED ORDER — SODIUM CHLORIDE 0.9% FLUSH
10.0000 mL | Freq: Once | INTRAVENOUS | Status: AC
  Administered 2020-03-16: 10 mL
  Filled 2020-03-16: qty 10

## 2020-03-16 MED ORDER — HEPARIN SOD (PORK) LOCK FLUSH 100 UNIT/ML IV SOLN
500.0000 [IU] | Freq: Once | INTRAVENOUS | Status: AC | PRN
  Administered 2020-03-16: 500 [IU]
  Filled 2020-03-16: qty 5

## 2020-03-16 MED ORDER — FAMOTIDINE IN NACL 20-0.9 MG/50ML-% IV SOLN
20.0000 mg | Freq: Once | INTRAVENOUS | Status: AC
  Administered 2020-03-16: 20 mg via INTRAVENOUS

## 2020-03-16 MED ORDER — DIPHENHYDRAMINE HCL 50 MG/ML IJ SOLN
25.0000 mg | Freq: Once | INTRAMUSCULAR | Status: AC
  Administered 2020-03-16: 25 mg via INTRAVENOUS

## 2020-03-16 MED ORDER — DIPHENHYDRAMINE HCL 50 MG/ML IJ SOLN
INTRAMUSCULAR | Status: AC
  Filled 2020-03-16: qty 1

## 2020-03-16 MED ORDER — SODIUM CHLORIDE 0.9 % IV SOLN
60.0000 mg/m2 | Freq: Once | INTRAVENOUS | Status: AC
  Administered 2020-03-16: 126 mg via INTRAVENOUS
  Filled 2020-03-16: qty 21

## 2020-03-16 MED ORDER — SODIUM CHLORIDE 0.9 % IV SOLN
10.0000 mg | Freq: Once | INTRAVENOUS | Status: AC
  Administered 2020-03-16: 10 mg via INTRAVENOUS
  Filled 2020-03-16: qty 10

## 2020-03-16 MED ORDER — SODIUM CHLORIDE 0.9% FLUSH
10.0000 mL | INTRAVENOUS | Status: DC | PRN
  Administered 2020-03-16: 10 mL
  Filled 2020-03-16: qty 10

## 2020-03-16 MED ORDER — SODIUM CHLORIDE 0.9 % IV SOLN
Freq: Once | INTRAVENOUS | Status: AC
  Filled 2020-03-16: qty 250

## 2020-03-16 MED ORDER — SODIUM CHLORIDE 0.9 % IV SOLN
8.0000 mg/kg | Freq: Once | INTRAVENOUS | Status: AC
  Administered 2020-03-16: 800 mg via INTRAVENOUS
  Filled 2020-03-16: qty 50

## 2020-03-16 NOTE — Progress Notes (Signed)
[facility] OFFICE PROGRESS NOTE   Diagnosis: Gastric cancer  INTERVAL HISTORY:   [name] completed another treatment with Taxol/ramucirumab on 05/03/2019.  She continues to have intermittent hematuria.  Stable peripheral neuropathy symptoms.  No abdominal pain.  She continues to have intermittent musculoskeletal pain relieved with hydrocodone and tramadol.  She vomits approximately once daily.  Good appetite. She has noted intermittent swelling of the left side of the face. Objective:  Vital signs in last 24 hours:  Blood pressure 118/73, pulse 90, temperature 97.7 F (36.5 C), temperature source Tympanic, resp. rate 16, height _0  (1.626 m), weight 199 lb 8 oz (90.5 kg), SpO2 100 %.    HEENT: No thrush or ulcers.  Slight prominence of the left malar area compared to the right side.  No tenderness or mass.  No neck swelling.  No venous engorgement or swelling in the lower neck or upper chest. Resp: Lungs clear bilaterally with decreased breath sounds at the right posterior base, no respiratory distress Cardio: Regular rate and rhythm GI: No hepatosplenomegaly, no mass, nontender Vascular: No leg edema Neuro: Moderate loss of vibratory sense at the fingertips bilaterally  Portacath/PICC-without erythema  Lab Results:  Lab Results  Component Value Date   WBC 3.8 (L) 03/16/2020   HGB 7.7 (L) 03/16/2020   HCT 25.2 (L) 03/16/2020   MCV 83.2 03/16/2020   PLT 281 03/16/2020   NEUTROABS 2.0 03/16/2020    CMP  Lab Results  Component Value Date   NA 141 03/02/2020   K 3.8 03/02/2020   CL 109 03/02/2020   CO2 25 03/02/2020   GLUCOSE 83 03/02/2020   BUN 14 03/02/2020   CREATININE 0.83 03/02/2020   CALCIUM 8.4 (L) 03/02/2020   PROT 6.6 03/02/2020   ALBUMIN 3.2 (L) 03/02/2020   AST 14 (L) 03/02/2020   ALT 7 03/02/2020   ALKPHOS 63 03/02/2020   BILITOT 0.4 03/02/2020   GFRNONAA >60 03/02/2020   GFRAA >60 01/06/2020    Lab Results  Component Value  Date   CEA1 3.45 09/04/2019      Medications: I have reviewed the patient's current medications.   Assessment/Plan: 1. Gastric cancer ? Presenting with dysphagia spring 2019 ? 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 CTs revealed wall thickening and luminal narrowing of the colon at the hepatic flexure and cecum ? PET scan with no evidence of distant metastatic disease or abnormal uptake other than thickening of the distal esophagus and proximal stomach ? 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 CTs 10/23/2017-diffuse prominence of the gastric wall, especially the antrum, focal wall thickening of the distal ascending colon and hepatic flexure, thickening of the cecum, nonspecific haziness of the mesenteric fat in the pelvis, mild prominence of lymph nodes at the greater curvature of the stomach ? Treatment with FOLFOX beginning July 2019 ? CTs October 2019-stable disease, FOLFOX continued ? CTs December 2019-stable disease ? January 2020 treatment transition to Xeloda maintenance, care transition to [place] ?  CTs in June 2020 in September 2020-stable disease, Xeloda continued ? CTs 06/23/2019-nonspecific thickening of the GE junction, intraluminal bladder mass ? Cystoscopy-metastatic signet ring cell adenocarcinoma ? Cycle 1 day 1  ramucirumab/paclitaxel 08/19/2019(given atanother facility) ? Cycle 1 day 1 ramucirumab/paclitaxel 09/04/2019 ? Cycle 1 day 8 Taxol 09/10/19 ? Cycle 1 day 15 ramucirumab/paclitaxel 6/2/2021canceled d/t neutropenia ? Ramucirumab/pactlitaxelevery 2 weeks 09/24/2019 ? Ramucirumab/paclitaxel 10/08/2019 ? Ramucirumab/paclitaxel 10/22/2019 ? Ramucirumab/paclitaxel 11/05/2019 ? CTs 11/24/2019-unchanged thickening of the distal esophagus/upper stomach, gastric body and antrum. Unchanged thickening of the transverse colon at the hepatic flexure, unchanged thickening of the urinary bladder, no evidence of progressive disease ? Ramucirumab/paclitaxel 11/25/2019 ? Ramucirumab/paclitaxel 12/09/2019 ? Ramucirumab/paclitaxel 12/23/2019 ? Ramucirumab/paclitaxel9/21/2021 ? Ramucirumab/paclitaxel 01/20/2020 ? Ramucirumab/paclitaxel 02/04/2020 ? Ramucirumab/paclitaxel 02/17/2020 ? Ramucirumab/paclitaxel 03/02/2020 ? Ramucirumab/paclitaxel 03/16/2020  2.Left breast cancer 2008,pT1c,pN0, status post a left lumpectomy with adjuvant chemotherapy and radiation, ER positive, PR positive, HER-2 positive  3.Mixed connective tissue disease/SLE  4.Lower extremity deep vein thrombosis maintained on apixaban  5.Family history of multiple cancers including breast and ovarian cancer  6.Dysphagia and intermittent vomiting secondary to #1  7.Hypertension  8.Peripheral neuropathy  9.Masslike fullness at the posterior right parotid/angle of the jaw  10. Dyspnea on exertion, ongoing for about a month, worsened after taxol/ramucirumab on 09/04/19 requiring ED visit on 5/23. CBC, CMP, troponin, BNP and chest xray negative  11.  Anemia likely secondary to combination of chemotherapy and blood loss     Disposition: [name] appears unchanged.  She is tolerating the Taxol and ramucirumab well.  She has stable neuropathy symptoms, potentially related to previous treatment with oxaliplatin,  Taxol, or another etiology.  The peripheral numbness does not significantly interfere with activities.  She will complete another treatment today.  She will be scheduled for restaging CT prior to an office visit in 2 weeks.  Betsy Coder, MD  03/16/2020  10:24 AM

## 2020-03-16 NOTE — Progress Notes (Signed)
Pt. Discharged in stable condition.

## 2020-03-16 NOTE — Progress Notes (Signed)
Nutrition Follow-up:  Patient with gastric cancer, receiving chemotherapy.    Met with patient during infusion.  Patient reports appetite is about the same.  Vomits 1 time daily.  Reports usually eats oatmeal or bagel with cheese and bacon sandwich. Lunch is sandwich ([place]) with chips.  Dinner is meat and vegetable and starch.  Tried boost soothe and ensure clear and liked them. Has not purchased any to have at home.     Medications: reviewed  Labs: reviewed  Anthropometrics:   Weight stable at 199 lb 200 lb on 10/5 204 lb on 8/10   NUTRITION DIAGNOSIS: Unintentional weight loss stable   INTERVENTION:  Discussed where to purchase boost soothe and ensure clear shakes.   Encouraged high calorie, high protein foods as able to maintain weight    MONITORING, EVALUATION, GOAL: weight trends, intake   NEXT VISIT: Jan 11 during infusion  Michelle Cain, [place], [place] Registered Dietitian [phone] (mobile)

## 2020-03-16 NOTE — Progress Notes (Signed)
Per. Dr. Benay Spice, maintain ramucirumab dose at 800 mg for today's treatment.

## 2020-03-16 NOTE — Progress Notes (Signed)
Per Dr. Benay Spice: OK to treat w/Hgb 7.7. 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

## 2020-03-16 NOTE — Patient Instructions (Signed)

## 2020-03-16 NOTE — Patient Instructions (Signed)
[facility] Discharge Instructions for Patients Receiving Chemotherapy  Today you received the following chemotherapy agents: cyramza, paclitaxel.  To help prevent nausea and vomiting after your treatment, we encourage you to take your nausea medication as directed.    If you develop nausea and vomiting that is not controlled by your nausea medication, call the clinic.   BELOW ARE SYMPTOMS THAT SHOULD BE REPORTED IMMEDIATELY:  *FEVER GREATER THAN 100.5 F  *CHILLS WITH OR WITHOUT FEVER  NAUSEA AND VOMITING THAT IS NOT CONTROLLED WITH YOUR NAUSEA MEDICATION  *UNUSUAL SHORTNESS OF BREATH  *UNUSUAL BRUISING OR BLEEDING  TENDERNESS IN MOUTH AND THROAT WITH OR WITHOUT PRESENCE OF ULCERS  *URINARY PROBLEMS  *BOWEL PROBLEMS  UNUSUAL RASH Items with * indicate a potential emergency and should be followed up as soon as possible.  Feel free to call the clinic should you have any questions or concerns. The clinic phone number is [phone].  Please show the CHEMO ALERT CARD at check-in to the Emergency Department and triage nurse.   

## 2020-03-26 ENCOUNTER — Ambulatory Visit (HOSPITAL_COMMUNITY)
Admission: RE | Admit: 2020-03-26 | Discharge: 2020-03-26 | Disposition: A | Discharge status: Home / Self Care | Payer: Medicare Other | Source: Ambulatory Visit | Attending: Oncology | Admitting: Oncology

## 2020-03-26 ENCOUNTER — Other Ambulatory Visit: Payer: Self-pay

## 2020-03-26 ENCOUNTER — Encounter (HOSPITAL_COMMUNITY): Payer: Self-pay

## 2020-03-26 DIAGNOSIS — C169 Malignant neoplasm of stomach, unspecified: Secondary | ICD-10-CM

## 2020-03-26 MED ORDER — SODIUM CHLORIDE (PF) 0.9 % IJ SOLN
INTRAMUSCULAR | Status: AC
  Filled 2020-03-26: qty 50

## 2020-03-26 MED ORDER — HEPARIN SOD (PORK) LOCK FLUSH 100 UNIT/ML IV SOLN
500.0000 [IU] | Freq: Once | INTRAVENOUS | Status: AC
  Administered 2020-03-26: 500 [IU] via INTRAVENOUS

## 2020-03-26 MED ORDER — HEPARIN SOD (PORK) LOCK FLUSH 100 UNIT/ML IV SOLN
INTRAVENOUS | Status: AC
  Filled 2020-03-26: qty 5

## 2020-03-26 MED ORDER — IOHEXOL 300 MG/ML  SOLN
100.0000 mL | Freq: Once | INTRAMUSCULAR | Status: AC | PRN
  Administered 2020-03-26: 100 mL via INTRAVENOUS

## 2020-03-28 ENCOUNTER — Other Ambulatory Visit: Payer: Self-pay | Admitting: Oncology

## 2020-03-30 ENCOUNTER — Other Ambulatory Visit: Payer: Medicare Other

## 2020-03-30 ENCOUNTER — Other Ambulatory Visit: Payer: Self-pay

## 2020-03-30 ENCOUNTER — Inpatient Hospital Stay: Payer: Medicare Other

## 2020-03-30 ENCOUNTER — Ambulatory Visit: Payer: Medicare Other

## 2020-03-30 ENCOUNTER — Inpatient Hospital Stay (HOSPITAL_BASED_OUTPATIENT_CLINIC_OR_DEPARTMENT_OTHER): Payer: Medicare Other | Admitting: Oncology

## 2020-03-30 ENCOUNTER — Inpatient Hospital Stay: Payer: Medicare Other | Attending: Oncology

## 2020-03-30 ENCOUNTER — Ambulatory Visit: Payer: Medicare Other | Admitting: Nurse Practitioner

## 2020-03-30 VITALS — BP 121/71 | HR 95 | Temp 97.1°F | Resp 17 | Ht 64.0 in | Wt 195.7 lb

## 2020-03-30 DIAGNOSIS — Z853 Personal history of malignant neoplasm of breast: Secondary | ICD-10-CM | POA: Insufficient documentation

## 2020-03-30 DIAGNOSIS — C169 Malignant neoplasm of stomach, unspecified: Secondary | ICD-10-CM | POA: Diagnosis present

## 2020-03-30 DIAGNOSIS — R1110 Vomiting, unspecified: Secondary | ICD-10-CM | POA: Insufficient documentation

## 2020-03-30 DIAGNOSIS — I10 Essential (primary) hypertension: Secondary | ICD-10-CM | POA: Insufficient documentation

## 2020-03-30 DIAGNOSIS — G629 Polyneuropathy, unspecified: Secondary | ICD-10-CM | POA: Diagnosis not present

## 2020-03-30 DIAGNOSIS — Z5111 Encounter for antineoplastic chemotherapy: Secondary | ICD-10-CM | POA: Diagnosis present

## 2020-03-30 DIAGNOSIS — D649 Anemia, unspecified: Secondary | ICD-10-CM | POA: Diagnosis not present

## 2020-03-30 DIAGNOSIS — Z5112 Encounter for antineoplastic immunotherapy: Secondary | ICD-10-CM | POA: Insufficient documentation

## 2020-03-30 DIAGNOSIS — Z95828 Presence of other vascular implants and grafts: Secondary | ICD-10-CM

## 2020-03-30 LAB — CBC WITH DIFFERENTIAL (CANCER CENTER ONLY)
Abs Immature Granulocytes: 0.01 10*3/uL (ref 0.00–0.07)
Basophils Absolute: 0 10*3/uL (ref 0.0–0.1)
Basophils Relative: 0 %
Eosinophils Absolute: 0 10*3/uL (ref 0.0–0.5)
Eosinophils Relative: 0 %
HCT: 27.2 % — ABNORMAL LOW (ref 36.0–46.0)
Hemoglobin: 8.2 g/dL — ABNORMAL LOW (ref 12.0–15.0)
Immature Granulocytes: 0 %
Lymphocytes Relative: 38 %
Lymphs Abs: 1.5 10*3/uL (ref 0.7–4.0)
MCH: 24.4 pg — ABNORMAL LOW (ref 26.0–34.0)
MCHC: 30.1 g/dL (ref 30.0–36.0)
MCV: 81 fL (ref 80.0–100.0)
Monocytes Absolute: 0.6 10*3/uL (ref 0.1–1.0)
Monocytes Relative: 14 %
Neutro Abs: 1.9 10*3/uL (ref 1.7–7.7)
Neutrophils Relative %: 48 %
Platelet Count: 327 10*3/uL (ref 150–400)
RBC: 3.36 MIL/uL — ABNORMAL LOW (ref 3.87–5.11)
RDW: 21.2 % — ABNORMAL HIGH (ref 11.5–15.5)
WBC Count: 4 10*3/uL (ref 4.0–10.5)
nRBC: 0 % (ref 0.0–0.2)

## 2020-03-30 LAB — SAMPLE TO BLOOD BANK

## 2020-03-30 LAB — CMP (CANCER CENTER ONLY)
ALT: 8 U/L (ref 0–44)
AST: 14 U/L — ABNORMAL LOW (ref 15–41)
Albumin: 3.3 g/dL — ABNORMAL LOW (ref 3.5–5.0)
Alkaline Phosphatase: 63 U/L (ref 38–126)
Anion gap: 6 (ref 5–15)
BUN: 10 mg/dL (ref 6–20)
CO2: 26 mmol/L (ref 22–32)
Calcium: 8.6 mg/dL — ABNORMAL LOW (ref 8.9–10.3)
Chloride: 107 mmol/L (ref 98–111)
Creatinine: 0.87 mg/dL (ref 0.44–1.00)
GFR, Estimated: >60 mL/min (ref >60)
Glucose, Bld: 91 mg/dL (ref 70–99)
Potassium: 3.8 mmol/L (ref 3.5–5.1)
Sodium: 139 mmol/L (ref 135–145)
Total Bilirubin: 0.4 mg/dL (ref 0.3–1.2)
Total Protein: 6.8 g/dL (ref 6.5–8.1)

## 2020-03-30 MED ORDER — DIPHENHYDRAMINE HCL 50 MG/ML IJ SOLN
INTRAMUSCULAR | Status: AC
  Filled 2020-03-30: qty 1

## 2020-03-30 MED ORDER — SODIUM CHLORIDE 0.9% FLUSH
10.0000 mL | Freq: Once | INTRAVENOUS | Status: AC
Start: 2020-03-30 — End: 2020-03-30
  Administered 2020-03-30: 10 mL
  Filled 2020-03-30: qty 10

## 2020-03-30 MED ORDER — SODIUM CHLORIDE 0.9% FLUSH
10.0000 mL | INTRAVENOUS | Status: DC | PRN
  Administered 2020-03-30: 10 mL
  Filled 2020-03-30: qty 10

## 2020-03-30 MED ORDER — HEPARIN SOD (PORK) LOCK FLUSH 100 UNIT/ML IV SOLN
500.0000 [IU] | Freq: Once | INTRAVENOUS | Status: AC | PRN
  Administered 2020-03-30: 500 [IU]
  Filled 2020-03-30: qty 5

## 2020-03-30 MED ORDER — FAMOTIDINE IN NACL 20-0.9 MG/50ML-% IV SOLN
20.0000 mg | Freq: Once | INTRAVENOUS | Status: AC
  Administered 2020-03-30: 20 mg via INTRAVENOUS

## 2020-03-30 MED ORDER — SODIUM CHLORIDE 0.9 % IV SOLN
Freq: Once | INTRAVENOUS | Status: AC
  Filled 2020-03-30: qty 250

## 2020-03-30 MED ORDER — DEXAMETHASONE SODIUM PHOSPHATE 100 MG/10ML IJ SOLN
10.0000 mg | Freq: Once | INTRAMUSCULAR | Status: AC
  Administered 2020-03-30: 10 mg via INTRAVENOUS
  Filled 2020-03-30: qty 10

## 2020-03-30 MED ORDER — DIPHENHYDRAMINE HCL 50 MG/ML IJ SOLN
25.0000 mg | Freq: Once | INTRAMUSCULAR | Status: AC
  Administered 2020-03-30: 25 mg via INTRAVENOUS

## 2020-03-30 MED ORDER — SODIUM CHLORIDE 0.9 % IV SOLN
7.0000 mg/kg | Freq: Once | INTRAVENOUS | Status: AC
  Administered 2020-03-30: 700 mg via INTRAVENOUS
  Filled 2020-03-30: qty 20

## 2020-03-30 MED ORDER — FAMOTIDINE IN NACL 20-0.9 MG/50ML-% IV SOLN
INTRAVENOUS | Status: AC
  Filled 2020-03-30: qty 50

## 2020-03-30 MED ORDER — SODIUM CHLORIDE 0.9 % IV SOLN
60.0000 mg/m2 | Freq: Once | INTRAVENOUS | Status: AC
  Administered 2020-03-30: 126 mg via INTRAVENOUS
  Filled 2020-03-30: qty 21

## 2020-03-30 NOTE — Patient Instructions (Signed)
[facility] Discharge Instructions for Patients Receiving Chemotherapy  Today you received the following chemotherapy agents: cyramza, paclitaxel.  To help prevent nausea and vomiting after your treatment, we encourage you to take your nausea medication as directed.    If you develop nausea and vomiting that is not controlled by your nausea medication, call the clinic.   BELOW ARE SYMPTOMS THAT SHOULD BE REPORTED IMMEDIATELY:  *FEVER GREATER THAN 100.5 F  *CHILLS WITH OR WITHOUT FEVER  NAUSEA AND VOMITING THAT IS NOT CONTROLLED WITH YOUR NAUSEA MEDICATION  *UNUSUAL SHORTNESS OF BREATH  *UNUSUAL BRUISING OR BLEEDING  TENDERNESS IN MOUTH AND THROAT WITH OR WITHOUT PRESENCE OF ULCERS  *URINARY PROBLEMS  *BOWEL PROBLEMS  UNUSUAL RASH Items with * indicate a potential emergency and should be followed up as soon as possible.  Feel free to call the clinic should you have any questions or concerns. The clinic phone number is [phone].  Please show the CHEMO ALERT CARD at check-in to the Emergency Department and triage nurse.   

## 2020-03-30 NOTE — Patient Instructions (Signed)

## 2020-03-30 NOTE — Progress Notes (Signed)
Patient with new weight 88.8 kg  OK with Dr Benay Spice to update Cyramza dose to 700 mg (8 mg/kg).  Received instructions to update future doses to new weight.  Orders modified to reflect above.  T.O. Dr Darletta Moll, PharmD

## 2020-03-30 NOTE — Progress Notes (Signed)
[facility] OFFICE PROGRESS NOTE   Diagnosis: Gastric cancer  INTERVAL HISTORY:   [name] returns for a scheduled visit.  She completed another treatment with Taxol/ramucirumab on 03/16/2020.  She continues to have episodes of vomiting approximately twice daily.  Stable neuropathy symptoms.  This does not interfere with activity.  Stable hematuria.  No other bleeding.  No new complaint.  Objective:  Vital signs in last 24 hours:  Blood pressure 121/71, pulse 95, temperature (!) 97.1 F (36.2 C), temperature source Tympanic, resp. rate 17, height _0  (1.626 m), weight 195 lb 11.2 oz (88.8 kg), SpO2 100 %.    HEENT: No thrush or ulcers Resp: Lungs clear bilaterally Cardio: Regular rate and rhythm GI: No mass, nontender, no hepatosplenomegaly Vascular: No leg edema    Portacath/PICC-without erythema  Lab Results:  Lab Results  Component Value Date   WBC 4.0 03/30/2020   HGB 8.2 (L) 03/30/2020   HCT 27.2 (L) 03/30/2020   MCV 81.0 03/30/2020   PLT 327 03/30/2020   NEUTROABS 1.9 03/30/2020    CMP  Lab Results  Component Value Date   NA 140 03/16/2020   K 3.6 03/16/2020   CL 110 03/16/2020   CO2 23 03/16/2020   GLUCOSE 98 03/16/2020   BUN 10 03/16/2020   CREATININE 0.83 03/16/2020   CALCIUM 8.3 (L) 03/16/2020   PROT 6.2 (L) 03/16/2020   ALBUMIN 3.0 (L) 03/16/2020   AST 12 (L) 03/16/2020   ALT 8 03/16/2020   ALKPHOS 61 03/16/2020   BILITOT 0.3 03/16/2020   GFRNONAA >60 03/16/2020   GFRAA >60 01/06/2020    Lab Results  Component Value Date   CEA1 3.45 09/04/2019    No results found for: INR  Imaging:  CT CHEST W CONTRAST  Result Date: 03/28/2020 CLINICAL DATA:  Restaging history of gastric cancer currently undergoing chemo. Complaints of shortness of breath on exertion as well as hematuria EXAM: CT CHEST, ABDOMEN, AND PELVIS WITH CONTRAST TECHNIQUE: Multidetector CT imaging of the chest, abdomen and pelvis was performed following  the standard protocol during bolus administration of intravenous contrast. CONTRAST:  115m OMNIPAQUE IOHEXOL 300 MG/ML  SOLN COMPARISON:  CT chest abdomen and pelvis November 24, 2019. FINDINGS: CT CHEST FINDINGS Cardiovascular: Accessed left chest wall port with tip in the right atrium. Normal size heart. No pericardial effusion. Mediastinum/Nodes: No enlarged mediastinal, hilar, or axillary lymph nodes. Changes of prior left thyroidectomy. Unchanged symmetric wall thickening of the lower esophagus (series 2, image 39). With similar fluid-filled upper esophagus. Trachea demonstrates no significant findings. Lungs/Pleura: Lungs are clear. No pleural effusion or pneumothorax. Musculoskeletal: No chest wall mass or suspicious bone lesions identified. CT ABDOMEN PELVIS FINDINGS Hepatobiliary: Mild diffuse hepatic steatosis. No suspicious hepatic lesions. Prior cholecystectomy. No biliary ductal dilatation. Pancreas: Unremarkable. No pancreatic ductal dilatation or surrounding inflammatory changes. Spleen: Splenic granuloma. Otherwise, normal in size without focal abnormality. Adrenals/Urinary Tract: Adrenal glands are unremarkable. Kidneys are normal, without renal calculi, focal lesion, or hydronephrosis. Similar eccentric thickening of the superior left aspect urinary bladder, measuring up to 1.4 cm in thickness (series 2, image 108). Stomach/Bowel: Stomach is incompletely distended limiting evaluation gastric wall. Within this limitation there is apparent similar diffuse thickening of stomach wall near the gastroesophageal junction along the gastric body and antrum (series 2, image 46). Appendix appears normal. 5 decreased soft tissue thickening of the transverse colon about the hepatic flexure (series 2, image 56). Vascular/Lymphatic: No significant vascular findings are present. No enlarged abdominal  or pelvic lymph nodes. Reproductive: Uterus and bilateral adnexa are unremarkable. Other: No abdominal wall hernia or  abnormality. No abdominopelvic ascites. Musculoskeletal: No acute or significant osseous findings. IMPRESSION: 1. Given degree of distension there is apparent similar diffuse thickening of the distal esophagus, stomach wall near the gastroesophageal junction, gastric body and antrum, consistent with known infiltrative primary gastric malignancy. 2. Slightly decreased soft tissue thickening of the transverse colon about the hepatic flexure, consistent with post treatment change in the region of the biopsy-proven metastatic cancer. 3. Unchanged symmetric wall thickening of the lower esophagus, consistent with known metastatic disease. 4. No evidence of metastatic disease within the chest. Electronically Signed   By: Dahlia Bailiff MD   On: 03/28/2020 12:26   CT ABDOMEN PELVIS W CONTRAST  Result Date: 03/28/2020 CLINICAL DATA:  Restaging history of gastric cancer currently undergoing chemo. Complaints of shortness of breath on exertion as well as hematuria EXAM: CT CHEST, ABDOMEN, AND PELVIS WITH CONTRAST TECHNIQUE: Multidetector CT imaging of the chest, abdomen and pelvis was performed following the standard protocol during bolus administration of intravenous contrast. CONTRAST:  143m OMNIPAQUE IOHEXOL 300 MG/ML  SOLN COMPARISON:  CT chest abdomen and pelvis November 24, 2019. FINDINGS: CT CHEST FINDINGS Cardiovascular: Accessed left chest wall port with tip in the right atrium. Normal size heart. No pericardial effusion. Mediastinum/Nodes: No enlarged mediastinal, hilar, or axillary lymph nodes. Changes of prior left thyroidectomy. Unchanged symmetric wall thickening of the lower esophagus (series 2, image 39). With similar fluid-filled upper esophagus. Trachea demonstrates no significant findings. Lungs/Pleura: Lungs are clear. No pleural effusion or pneumothorax. Musculoskeletal: No chest wall mass or suspicious bone lesions identified. CT ABDOMEN PELVIS FINDINGS Hepatobiliary: Mild diffuse hepatic steatosis. No  suspicious hepatic lesions. Prior cholecystectomy. No biliary ductal dilatation. Pancreas: Unremarkable. No pancreatic ductal dilatation or surrounding inflammatory changes. Spleen: Splenic granuloma. Otherwise, normal in size without focal abnormality. Adrenals/Urinary Tract: Adrenal glands are unremarkable. Kidneys are normal, without renal calculi, focal lesion, or hydronephrosis. Similar eccentric thickening of the superior left aspect urinary bladder, measuring up to 1.4 cm in thickness (series 2, image 108). Stomach/Bowel: Stomach is incompletely distended limiting evaluation gastric wall. Within this limitation there is apparent similar diffuse thickening of stomach wall near the gastroesophageal junction along the gastric body and antrum (series 2, image 46). Appendix appears normal. 5 decreased soft tissue thickening of the transverse colon about the hepatic flexure (series 2, image 56). Vascular/Lymphatic: No significant vascular findings are present. No enlarged abdominal or pelvic lymph nodes. Reproductive: Uterus and bilateral adnexa are unremarkable. Other: No abdominal wall hernia or abnormality. No abdominopelvic ascites. Musculoskeletal: No acute or significant osseous findings. IMPRESSION: 1. Given degree of distension there is apparent similar diffuse thickening of the distal esophagus, stomach wall near the gastroesophageal junction, gastric body and antrum, consistent with known infiltrative primary gastric malignancy. 2. Slightly decreased soft tissue thickening of the transverse colon about the hepatic flexure, consistent with post treatment change in the region of the biopsy-proven metastatic cancer. 3. Unchanged symmetric wall thickening of the lower esophagus, consistent with known metastatic disease. 4. No evidence of metastatic disease within the chest. Electronically Signed   By: JDahlia BailiffMD   On: 03/28/2020 12:26    Medications: I have reviewed the patient's current  medications.   Assessment/Plan: 1. Gastric cancer ? Presenting with dysphagia spring 2019 ? Upper endoscopy 08/17/2017 revealed gastric and esophageal erosion, biopsies from the distal esophagus, gastric erosion, and random stomach biopsies confirmed poorly differentiated invasive  adenocarcinoma with signet ring cell morphology, no loss of mismatch repair protein expression, HER-2 negative by FISH, GATA3negative, ER negative ? CTs revealed wall thickening and luminal narrowing of the colon at the hepatic flexure and cecum ? PET scan with no evidence of distant metastatic disease or abnormal uptake other than thickening of the distal esophagus and proximal stomach ? 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 CTs 10/23/2017-diffuse prominence of the gastric wall, especially the antrum, focal wall thickening of the distal ascending colon and hepatic flexure, thickening of the cecum, nonspecific haziness of the mesenteric fat in the pelvis, mild prominence of lymph nodes at the greater curvature of the stomach ? Treatment with FOLFOX beginning July 2019 ? CTs October 2019-stable disease, FOLFOX continued ? CTs December 2019-stable disease ? January 2020 treatment transition to Xeloda maintenance, care transition to [place] ? CTs in June 2020 in September 2020-stable disease, Xeloda continued ? CTs 06/23/2019-nonspecific thickening of the GE junction, intraluminal bladder mass ? Cystoscopy-metastatic signet ring cell adenocarcinoma ? Cycle 1 day 1 ramucirumab/paclitaxel 08/19/2019(given atanother facility) ? Cycle 1 day 1  ramucirumab/paclitaxel 09/04/2019 ? Cycle 1 day 8 Taxol 09/10/19 ? Cycle 1 day 15 ramucirumab/paclitaxel 6/2/2021canceled d/t neutropenia ? Ramucirumab/pactlitaxelevery 2 weeks 09/24/2019 ? Ramucirumab/paclitaxel 10/08/2019 ? Ramucirumab/paclitaxel 10/22/2019 ? Ramucirumab/paclitaxel 11/05/2019 ? CTs 11/24/2019-unchanged thickening of the distal esophagus/upper stomach, gastric body and antrum. Unchanged thickening of the transverse colon at the hepatic flexure, unchanged thickening of the urinary bladder, no evidence of progressive disease ? Ramucirumab/paclitaxel 11/25/2019 ? Ramucirumab/paclitaxel 12/09/2019 ? Ramucirumab/paclitaxel 12/23/2019 ? Ramucirumab/paclitaxel9/21/2021 ? Ramucirumab/paclitaxel 01/20/2020 ? Ramucirumab/paclitaxel 02/04/2020 ? Ramucirumab/paclitaxel 02/17/2020 ? Ramucirumab/paclitaxel 03/02/2020 ? Ramucirumab/paclitaxel 03/16/2020 ? CTs 03/28/2020-stable thickening of the distal esophagus, GE junction, gastric body, and antrum.  Decreased soft tissue thickening of the transverse colon at the hepatic flexure, no evidence of metastatic disease to the chest ? Ramucirumab/paclitaxel 03/30/2020  2.Left breast cancer 2008,pT1c,pN0, status post a left lumpectomy with adjuvant chemotherapy and radiation, ER positive, PR positive, HER-2 positive  3.Mixed connective tissue disease/SLE  4.Lower extremity deep vein thrombosis maintained on apixaban  5.Family history of multiple cancers including breast and ovarian cancer  6.Dysphagia and intermittent vomiting secondary to #1  7.Hypertension  8.Peripheral neuropathy  9.Masslike fullness at the posterior right parotid/angle of the jaw  10. Dyspnea on exertion, ongoing for about a month, worsened after taxol/ramucirumab on 09/04/19 requiring ED visit on 5/23. CBC, CMP, troponin, BNP and chest xray negative  11.  Anemia likely secondary to combination of chemotherapy and blood  loss   Disposition: [name] appears stable.  I reviewed the restaging CT results and images with [name] and her mother.  There is no radiologic evidence of disease progression.  The plan is to continue Taxol/ramucirumab.  She has stable neuropathy symptoms.  She continues to vomit daily.  She may benefit from a course of palliative radiation to the stomach.  I will refer her to Dr. Lisbeth Renshaw.  She will return for an office visit and chemotherapy in 2 weeks.  Betsy Coder, MD  03/30/2020  8:56 AM

## 2020-04-05 NOTE — Progress Notes (Signed)
GI Location of Tumor / Histology: Malignant neoplasm of stomach  Michelle Cain presented with dysphagia spring 2019.  She has been on chemotherapy since July 2019-December 2019 FOLFOX.  Care transitioned to cancer treatment center of [place] January 2020- Xeloda maintenance  Chemotherapy restarted 08/19/2019-Ongoing ramucirumab/paclitaxel  CT CAP 03/28/2020: Stable thickening of the distal esophagus, GE junction, gastric body, and antrum.  Decreased soft tissue thickening of the transverse colon at the hepatic flexure, no evidence of metastatic disease to the chest.  CT AP 11/24/2019: Unchanged thickening of the distal esophagus/upper stomach, gastric body and antrum.  Unchanged thickening of the transverse colon at the hepatic flexure, unchanged thickening of the urinary bladder, no evidence of progressive disease.  Cystoscopy: metastatic signet ring cell adenocarcinoma  CT AP 06/23/2019: non-specific thickening of the GE junction, intraluminal bladder mass.  Biopsies of   Past/Anticipated interventions by surgeon, if any:  Past/Anticipated interventions by medical oncology, if any:  Dr. Benay Spice 03/30/2020 - I reviewed the restaging CT results and images with Michelle Cain and her mother.  There is no radiologic evidence of disease progression.  The plan is to continue Taxol/ramucirumab.  She has stable neuropathy symptoms. -She continues to vomit daily.  She may benefit from a course of palliative radiation to the stomach.  I will refer her to Dr. Lisbeth Renshaw. -She will return for an office visit and chemotherapy in 2 weeks.  Weight changes, if any: Lost about 4 pounds in a week.  Wt is up and down.  Bowel/Bladder complaints, if any: No changes noted.  She states she has blood in her urine, bright red in color.  Increased in amount since last Tuesday.  Nausea / Vomiting, if any: Vomiting, mainly right after eating.  Pain issues, if any:  She has constant pain due to Lupus.  Any  blood per rectum: None noted.    SAFETY ISSUES:  Prior radiation? Left Breast lumpectomy with radiation 2008   Pacemaker/ICD? No  Possible current pregnancy? Postmenopausal  Is the patient on methotrexate? No  Current Complaints/Details: Bladder Biopsy 07/30/2019

## 2020-04-06 ENCOUNTER — Encounter: Payer: Self-pay | Admitting: Radiation Oncology

## 2020-04-06 ENCOUNTER — Ambulatory Visit
Admission: RE | Admit: 2020-04-06 | Discharge: 2020-04-06 | Disposition: A | Discharge status: Home / Self Care | Payer: Medicare Other | Source: Ambulatory Visit | Attending: Radiation Oncology | Admitting: Radiation Oncology

## 2020-04-06 ENCOUNTER — Ambulatory Visit: Payer: Medicare Other | Admitting: Radiation Oncology

## 2020-04-06 ENCOUNTER — Other Ambulatory Visit: Payer: Self-pay

## 2020-04-06 VITALS — BP 100/68 | HR 102 | Temp 97.5°F | Resp 20 | Ht 64.0 in | Wt 191.5 lb

## 2020-04-06 DIAGNOSIS — I7300 Raynaud's syndrome without gangrene: Secondary | ICD-10-CM | POA: Insufficient documentation

## 2020-04-06 DIAGNOSIS — Z923 Personal history of irradiation: Secondary | ICD-10-CM | POA: Diagnosis not present

## 2020-04-06 DIAGNOSIS — Z7901 Long term (current) use of anticoagulants: Secondary | ICD-10-CM | POA: Diagnosis not present

## 2020-04-06 DIAGNOSIS — M329 Systemic lupus erythematosus, unspecified: Secondary | ICD-10-CM | POA: Insufficient documentation

## 2020-04-06 DIAGNOSIS — Z510 Encounter for antineoplastic radiation therapy: Secondary | ICD-10-CM | POA: Diagnosis not present

## 2020-04-06 DIAGNOSIS — C168 Malignant neoplasm of overlapping sites of stomach: Secondary | ICD-10-CM | POA: Insufficient documentation

## 2020-04-06 DIAGNOSIS — Z79899 Other long term (current) drug therapy: Secondary | ICD-10-CM | POA: Diagnosis not present

## 2020-04-06 DIAGNOSIS — C7911 Secondary malignant neoplasm of bladder: Secondary | ICD-10-CM | POA: Insufficient documentation

## 2020-04-06 DIAGNOSIS — Z853 Personal history of malignant neoplasm of breast: Secondary | ICD-10-CM | POA: Diagnosis not present

## 2020-04-06 NOTE — Progress Notes (Signed)
Radiation Oncology         [phone] ________________________________  Name: Michelle Cain        MRN: [id number]  Date of Service: 04/06/2020 DOB: March 08, 1966  [id number], Michelle Harder, DO  Michelle Pier, MD     REFERRING PHYSICIAN: Ladell Pier, MD   DIAGNOSIS: The encounter diagnosis was Malignant neoplasm of overlapping sites of stomach [facility]).   HISTORY OF PRESENT ILLNESS: Michelle Cain is a 54 y.o. female seen at the request of Dr. Benay Spice for history of stomach cancer. 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  She is relocated her care here to [facility], and recent CT imaging on 03/28/2020 showed stable thickening of the distal esophagus GE junction gastric body and antrum as well as decreased soft tissue thickening of the transverse colon at the hepatic flexure and no evidence of metastatic disease in the chest.  The patient has had difficulty with postprandial emesis, and is seen today to discuss options of palliative radiotherapy to the stomach.   PREVIOUS RADIATION THERAPY: Yes   2008:  The patient reports that the left breast was treated with adjuvant radiotherapy over what she recalls is being about 6 weeks of radiation in [place].  She does not  recall any other details about the treatment.  Of note she did have a diagnosis of lupus at that time and reports that she did very well during her radiotherapy to the breast without incident   PAST MEDICAL HISTORY:  Past Medical History:  Diagnosis Date  . Breast CA ([place]) dx'd 2007   left  . Lupus ([place])   . Raynaud disease   . stomach/colon ca dx'd 2020       PAST SURGICAL HISTORY: Past Surgical History:  Procedure Laterality Date  . BREAST SURGERY    . CHOLECYSTECTOMY       FAMILY HISTORY:  Family History  Problem Relation Age of Onset  . Sudden death Neg Hx   . Hypertension Neg Hx   . Hyperlipidemia Neg Hx   . Heart attack Neg Hx   . Diabetes Neg Hx      SOCIAL HISTORY:  reports that she has never smoked. She has never used smokeless tobacco. She reports that she does not drink alcohol and does not use drugs.  The patient is divorced.  She has custody of her grandchild.  She is accompanied by her mother.   ALLERGIES: Patient has no known allergies.   MEDICATIONS:  Current Outpatient Medications  Medication Sig Dispense Refill  . amLODipine (NORVASC) 5 MG tablet Take 5 mg by mouth daily.    Marland Kitchen apixaban (ELIQUIS) 5 MG TABS tablet Take 5 mg by mouth in the morning and at bedtime.    Marland Kitchen azaTHIOprine (IMURAN) 50 MG tablet Take 50 mg by mouth 2 (two) times daily.    . Cholecalciferol 50 MCG (  2000 UT) CAPS Take 2,000 Units by mouth daily.    . cyclobenzaprine (FLEXERIL) 10 MG tablet Take 1 tablet by mouth 3 (three) times daily as needed.    . docusate sodium (COLACE) 100 MG capsule Take 100 mg by mouth at bedtime.    . gabapentin (NEURONTIN) 300 MG capsule Take 300 mg by mouth in the morning and at bedtime.    Marland Kitchen HYDROcodone-acetaminophen (NORCO) 10-325 MG tablet Take 1 tablet by mouth every 6 (six) hours as needed for moderate pain or severe pain.     . hydroxychloroquine (PLAQUENIL) 200 MG tablet Take 200 mg by mouth 2 (two) times daily.    Marland Kitchen LORazepam (ATIVAN) 0.5 MG  tablet Place 1 tablet (0.5 mg total) under the tongue every 6 (six) hours as needed (nausea). 30 tablet 2  . meloxicam (MOBIC) 15 MG tablet Take 1 tablet (15 mg total) by mouth daily. 30 tablet 1  . metoCLOPramide (REGLAN) 10 MG tablet Take 10 mg by mouth 3 (three) times daily before meals.    Marland Kitchen omeprazole (PRILOSEC) 20 MG capsule Take 20 mg by mouth daily.    . ondansetron (ZOFRAN) 8 MG tablet Take 1 tablet (8 mg total) by mouth every 8 (eight) hours as needed for nausea or vomiting. 30 tablet 1  . Polyethylene Glycol 3350 (MIRALAX PO) Take 17 g by mouth in the morning and at bedtime.    . potassium chloride (KLOR-CON) 10 MEQ tablet Take 10 mEq by mouth daily.    . prochlorperazine (COMPAZINE) 10 MG tablet Take 10 mg by mouth every 6 (six) hours as needed for nausea or vomiting.     . traMADol (ULTRAM) 50 MG tablet Take 1 tablet (50 mg total) by mouth every 8 (eight) hours as needed for pain. 90 tablet 1  . furosemide (LASIX) 20 MG tablet Take 20 mg by mouth daily as needed for fluid or edema.  (Patient not taking: No sig reported)    . loperamide (IMODIUM) 2 MG capsule Take 2 mg by mouth as needed for diarrhea or loose stools.  (Patient not taking: No sig reported)    . magic mouthwash SOLN Take 5-10 mLs by mouth 4 (four) times daily as needed for mouth pain. Swish and spit (Patient not taking: No sig reported) 400 mL 1   No current facility-administered medications for this encounter.   Facility-Administered Medications Ordered in Other Encounters  Medication Dose Route Frequency Provider Last Rate Last Admin  . sodium chloride flush (NS) 0.9 % injection 10 mL  10 mL Intravenous PRN Michelle Pier, MD   10 mL at 09/17/19 1109     REVIEW OF SYSTEMS: On review of systems, the patient reports that she is doing okay.  She describes postprandial nausea and vomiting that occurs most days especially after her evening meal.  She states that she is usually able to back and finish the meal and this  does stay down, but is not more than 2 minutes after eating that she starts feeling this way.  She denies any other episodes of nauseousness.  She denies any coffee-ground emesis or frank hematemesis.  She states that her bowels are moving well without any significant difficulties.  She does have bright red blood in her urine that has increased since last Tuesday and is felt to be persistent due to her known disease in the bladder.  She is lost about 4 pounds in the last week, and reports that she has constant pain due to  her lupus.  She does not take immunomodulating therapy for this.  No other complaints are verbalized.  PHYSICAL EXAM:  Wt Readings from Last 3 Encounters:  04/06/20 191 lb 8 oz (86.9 kg)  03/30/20 195 lb 11.2 oz (88.8 kg)  03/16/20 199 lb 8 oz (90.5 kg)   Temp Readings from Last 3 Encounters:  04/06/20 (!) 97.5 F (36.4 C) (Temporal)  03/30/20 (!) 97.1 F (36.2 C) (Tympanic)  03/16/20 97.7 F (36.5 C) (Tympanic)   BP Readings from Last 3 Encounters:  04/06/20 100/68  03/30/20 121/71  03/16/20 118/73   Pulse Readings from Last 3 Encounters:  04/06/20 (!) 102  03/30/20 95  03/16/20 90   Pain Assessment Pain Score: 8  Pain Loc: Generalized (She has Lupus, has constant pain)/10  In general this is a well appearing African American female in no acute distress. She's alert and oriented x4 and appropriate throughout the examination. Cardiopulmonary assessment is negative for acute distress and she exhibits normal effort.     ECOG =1  0 - Asymptomatic (Fully active, able to carry on all predisease activities without restriction)  1 - Symptomatic but completely ambulatory (Restricted in physically strenuous activity but ambulatory and able to carry out work of a light or sedentary nature. For example, light housework, office work)  2 - Symptomatic, <50% in bed during the day (Ambulatory and capable of all self care but unable to carry out any work activities. Up and  about more than 50% of waking hours)  3 - Symptomatic, >50% in bed, but not bedbound (Capable of only limited self-care, confined to bed or chair 50% or more of waking hours)  4 - Bedbound (Completely disabled. Cannot carry on any self-care. Totally confined to bed or chair)  5 - Death   Eustace Pen MM, Creech RH, Tormey DC, et al. [phone]). "Toxicity and response criteria of the [facility]". [place] Oncol. 5 (6): 649-55    LABORATORY DATA:  Lab Results  Component Value Date   WBC 4.0 03/30/2020   HGB 8.2 (L) 03/30/2020   HCT 27.2 (L) 03/30/2020   MCV 81.0 03/30/2020   PLT 327 03/30/2020   Lab Results  Component Value Date   NA 139 03/30/2020   K 3.8 03/30/2020   CL 107 03/30/2020   CO2 26 03/30/2020   Lab Results  Component Value Date   ALT 8 03/30/2020   AST 14 (L) 03/30/2020   ALKPHOS 63 03/30/2020   BILITOT 0.4 03/30/2020      RADIOGRAPHY: CT CHEST W CONTRAST  Result Date: 03/28/2020 CLINICAL DATA:  Restaging history of gastric cancer currently undergoing chemo. Complaints of shortness of breath on exertion as well as hematuria EXAM: CT CHEST, ABDOMEN, AND PELVIS WITH CONTRAST TECHNIQUE: Multidetector CT imaging of the chest, abdomen and pelvis was performed following the standard protocol during bolus administration of intravenous contrast. CONTRAST:  136mL OMNIPAQUE IOHEXOL 300 MG/ML  SOLN COMPARISON:  CT chest abdomen and pelvis November 24, 2019. FINDINGS: CT CHEST FINDINGS Cardiovascular: Accessed left chest wall port with tip in the right atrium. Normal size heart. No pericardial effusion. Mediastinum/Nodes: No enlarged mediastinal, hilar, or axillary lymph nodes. Changes of prior left thyroidectomy. Unchanged symmetric wall thickening of the lower esophagus (series 2, image 39). With similar fluid-filled upper esophagus. Trachea demonstrates no significant findings. Lungs/Pleura: Lungs are clear. No pleural effusion or pneumothorax. Musculoskeletal:  No chest wall mass or suspicious bone lesions identified. CT ABDOMEN PELVIS FINDINGS Hepatobiliary: Mild diffuse  hepatic steatosis. No suspicious hepatic lesions. Prior cholecystectomy. No biliary ductal dilatation. Pancreas: Unremarkable. No pancreatic ductal dilatation or surrounding inflammatory changes. Spleen: Splenic granuloma. Otherwise, normal in size without focal abnormality. Adrenals/Urinary Tract: Adrenal glands are unremarkable. Kidneys are normal, without renal calculi, focal lesion, or hydronephrosis. Similar eccentric thickening of the superior left aspect urinary bladder, measuring up to 1.4 cm in thickness (series 2, image 108). Stomach/Bowel: Stomach is incompletely distended limiting evaluation gastric wall. Within this limitation there is apparent similar diffuse thickening of stomach wall near the gastroesophageal junction along the gastric body and antrum (series 2, image 46). Appendix appears normal. 5 decreased soft tissue thickening of the transverse colon about the hepatic flexure (series 2, image 56). Vascular/Lymphatic: No significant vascular findings are present. No enlarged abdominal or pelvic lymph nodes. Reproductive: Uterus and bilateral adnexa are unremarkable. Other: No abdominal wall hernia or abnormality. No abdominopelvic ascites. Musculoskeletal: No acute or significant osseous findings. IMPRESSION: 1. Given degree of distension there is apparent similar diffuse thickening of the distal esophagus, stomach wall near the gastroesophageal junction, gastric body and antrum, consistent with known infiltrative primary gastric malignancy. 2. Slightly decreased soft tissue thickening of the transverse colon about the hepatic flexure, consistent with post treatment change in the region of the biopsy-proven metastatic cancer. 3. Unchanged symmetric wall thickening of the lower esophagus, consistent with known metastatic disease. 4. No evidence of metastatic disease within the chest.  Electronically Signed   By: Dahlia Bailiff MD   On: 03/28/2020 12:26   CT ABDOMEN PELVIS W CONTRAST  Result Date: 03/28/2020 CLINICAL DATA:  Restaging history of gastric cancer currently undergoing chemo. Complaints of shortness of breath on exertion as well as hematuria EXAM: CT CHEST, ABDOMEN, AND PELVIS WITH CONTRAST TECHNIQUE: Multidetector CT imaging of the chest, abdomen and pelvis was performed following the standard protocol during bolus administration of intravenous contrast. CONTRAST:  113mL OMNIPAQUE IOHEXOL 300 MG/ML  SOLN COMPARISON:  CT chest abdomen and pelvis November 24, 2019. FINDINGS: CT CHEST FINDINGS Cardiovascular: Accessed left chest wall port with tip in the right atrium. Normal size heart. No pericardial effusion. Mediastinum/Nodes: No enlarged mediastinal, hilar, or axillary lymph nodes. Changes of prior left thyroidectomy. Unchanged symmetric wall thickening of the lower esophagus (series 2, image 39). With similar fluid-filled upper esophagus. Trachea demonstrates no significant findings. Lungs/Pleura: Lungs are clear. No pleural effusion or pneumothorax. Musculoskeletal: No chest wall mass or suspicious bone lesions identified. CT ABDOMEN PELVIS FINDINGS Hepatobiliary: Mild diffuse hepatic steatosis. No suspicious hepatic lesions. Prior cholecystectomy. No biliary ductal dilatation. Pancreas: Unremarkable. No pancreatic ductal dilatation or surrounding inflammatory changes. Spleen: Splenic granuloma. Otherwise, normal in size without focal abnormality. Adrenals/Urinary Tract: Adrenal glands are unremarkable. Kidneys are normal, without renal calculi, focal lesion, or hydronephrosis. Similar eccentric thickening of the superior left aspect urinary bladder, measuring up to 1.4 cm in thickness (series 2, image 108). Stomach/Bowel: Stomach is incompletely distended limiting evaluation gastric wall. Within this limitation there is apparent similar diffuse thickening of stomach wall near the  gastroesophageal junction along the gastric body and antrum (series 2, image 46). Appendix appears normal. 5 decreased soft tissue thickening of the transverse colon about the hepatic flexure (series 2, image 56). Vascular/Lymphatic: No significant vascular findings are present. No enlarged abdominal or pelvic lymph nodes. Reproductive: Uterus and bilateral adnexa are unremarkable. Other: No abdominal wall hernia or abnormality. No abdominopelvic ascites. Musculoskeletal: No acute or significant osseous findings. IMPRESSION: 1. Given degree of distension there is apparent similar diffuse  thickening of the distal esophagus, stomach wall near the gastroesophageal junction, gastric body and antrum, consistent with known infiltrative primary gastric malignancy. 2. Slightly decreased soft tissue thickening of the transverse colon about the hepatic flexure, consistent with post treatment change in the region of the biopsy-proven metastatic cancer. 3. Unchanged symmetric wall thickening of the lower esophagus, consistent with known metastatic disease. 4. No evidence of metastatic disease within the chest. Electronically Signed   By: Dahlia Bailiff MD   On: 03/28/2020 12:26       IMPRESSION/PLAN: 1. 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  Dr. Lisbeth Renshaw discusses the findings and rationale to consider palliative radiotherapy in this setting.  He discusses the risks, benefits, short and long-term effects of radiation as well as the delivery and logistics.  With her ongoing chemo and immunotherapy, Dr. Lisbeth Renshaw recommends that we administer this treatment every 3 weeks time.  She is in agreement with this.  Written consent is obtained a copy was provided to the patient and another copy placed in her chart.  She will simulate this morning and begin treatment on Monday.  She will continue her systemic therapy and follow-up with urology as well as needed given her symptoms from the  bladder. 2. Remote history of breast cancer.  The patient continues to follow along with Dr. Benay Spice for her gastric cancer and this will be followed expectantly.  In a visit lasting 45 minutes, greater than 50% of the time was spent face to face discussing the patient's condition, in preparation for the discussion, and coordinating the patient's care.   The above documentation reflects my direct findings during this shared patient visit. Please see the separate note by Dr. Lisbeth Renshaw on this date for the remainder of the patient's plan of care.    Carola Rhine, PAC

## 2020-04-07 ENCOUNTER — Encounter: Payer: Self-pay | Admitting: *Deleted

## 2020-04-07 DIAGNOSIS — Z510 Encounter for antineoplastic radiation therapy: Secondary | ICD-10-CM | POA: Diagnosis not present

## 2020-04-07 NOTE — Progress Notes (Signed)
[facility] Psychosocial Distress Screening Clinical Social Work  Clinical Social Work was referred by distress screening protocol.  The patient scored a 8 on the Psychosocial Distress Thermometer which indicates moderate distress. Clinical Social Worker contacted patient by phone to assess for distress and other psychosocial needs.  Patient stated she was doing "ok", and felt comfortable after meeting with her healthcare team.  CSW provided education on CSW role and support services at [facility].  Patient did not identify any needs at this time.  CSW encouraged patient to call with questions or concerns.      ONCBCN DISTRESS SCREENING 04/06/2020  Screening Type Initial Screening  Distress experienced in past week (1-10) 8  Physical Problem type Pain;Nausea/vomiting;Bathing/dressing;Breathing;Changes in urination;Tingling hands/feet  Physician notified of physical symptoms   Referral to clinical psychology   Referral to clinical social work   Referral to dietition   Referral to financial advocate   Referral to support programs   Referral to palliative care   Other Contact via phone [phone]    Johnnye Lana, MSW, LCSW, OSW-C Clinical Social Worker [place] [phone]

## 2020-04-08 ENCOUNTER — Telehealth: Payer: Self-pay | Admitting: *Deleted

## 2020-04-08 DIAGNOSIS — D649 Anemia, unspecified: Secondary | ICD-10-CM

## 2020-04-08 DIAGNOSIS — C169 Malignant neoplasm of stomach, unspecified: Secondary | ICD-10-CM

## 2020-04-08 NOTE — Telephone Encounter (Addendum)
Called to report SOB again over past 3 days--only occurs w/exertion (walking around). Not short of breath with rest or conversation. No cough or sinus issues. No fever or swelling or chest pain.  Asking what she needs to do? She has been having more blood in urine intermittently. Per Dr. Benay Spice, most likely due to anemia. Can come in today to check labs and arrange transfusion somewhere over weekend. She declines and would like to wait till Monday when here for SBRT appointment. Scheduled lab for 12/27 at 11:30. She agrees to avoid exertion and understands to go to ER if necessary for worse shortness of breath or chest pain or fever.

## 2020-04-11 ENCOUNTER — Other Ambulatory Visit: Payer: Self-pay | Admitting: Oncology

## 2020-04-12 ENCOUNTER — Other Ambulatory Visit: Payer: Self-pay | Admitting: *Deleted

## 2020-04-12 ENCOUNTER — Other Ambulatory Visit: Payer: Self-pay

## 2020-04-12 ENCOUNTER — Inpatient Hospital Stay: Payer: Medicare Other

## 2020-04-12 ENCOUNTER — Telehealth: Payer: Self-pay | Admitting: Emergency Medicine

## 2020-04-12 ENCOUNTER — Other Ambulatory Visit: Payer: Self-pay | Admitting: Oncology

## 2020-04-12 ENCOUNTER — Ambulatory Visit
Admission: RE | Admit: 2020-04-12 | Discharge: 2020-04-12 | Disposition: A | Discharge status: Home / Self Care | Payer: Medicare Other | Source: Ambulatory Visit | Attending: Radiation Oncology | Admitting: Radiation Oncology

## 2020-04-12 DIAGNOSIS — C169 Malignant neoplasm of stomach, unspecified: Secondary | ICD-10-CM

## 2020-04-12 DIAGNOSIS — Z95828 Presence of other vascular implants and grafts: Secondary | ICD-10-CM

## 2020-04-12 DIAGNOSIS — Z5112 Encounter for antineoplastic immunotherapy: Secondary | ICD-10-CM | POA: Diagnosis not present

## 2020-04-12 DIAGNOSIS — D649 Anemia, unspecified: Secondary | ICD-10-CM

## 2020-04-12 DIAGNOSIS — Z510 Encounter for antineoplastic radiation therapy: Secondary | ICD-10-CM | POA: Diagnosis not present

## 2020-04-12 LAB — CBC WITH DIFFERENTIAL (CANCER CENTER ONLY)
Abs Immature Granulocytes: 0.01 10*3/uL (ref 0.00–0.07)
Basophils Absolute: 0 10*3/uL (ref 0.0–0.1)
Basophils Relative: 0 %
Eosinophils Absolute: 0 10*3/uL (ref 0.0–0.5)
Eosinophils Relative: 1 %
HCT: 20.2 % — ABNORMAL LOW (ref 36.0–46.0)
Hemoglobin: 6 g/dL — CL (ref 12.0–15.0)
Immature Granulocytes: 0 %
Lymphocytes Relative: 32 %
Lymphs Abs: 1.3 10*3/uL (ref 0.7–4.0)
MCH: 24.2 pg — ABNORMAL LOW (ref 26.0–34.0)
MCHC: 29.7 g/dL — ABNORMAL LOW (ref 30.0–36.0)
MCV: 81.5 fL (ref 80.0–100.0)
Monocytes Absolute: 0.5 10*3/uL (ref 0.1–1.0)
Monocytes Relative: 11 %
Neutro Abs: 2.2 10*3/uL (ref 1.7–7.7)
Neutrophils Relative %: 56 %
Platelet Count: 320 10*3/uL (ref 150–400)
RBC: 2.48 MIL/uL — ABNORMAL LOW (ref 3.87–5.11)
RDW: 22 % — ABNORMAL HIGH (ref 11.5–15.5)
WBC Count: 4 10*3/uL (ref 4.0–10.5)
nRBC: 0.5 % — ABNORMAL HIGH (ref 0.0–0.2)

## 2020-04-12 LAB — CMP (CANCER CENTER ONLY)
ALT: 8 U/L (ref 0–44)
AST: 15 U/L (ref 15–41)
Albumin: 3 g/dL — ABNORMAL LOW (ref 3.5–5.0)
Alkaline Phosphatase: 55 U/L (ref 38–126)
Anion gap: 5 (ref 5–15)
BUN: 13 mg/dL (ref 6–20)
CO2: 26 mmol/L (ref 22–32)
Calcium: 8.4 mg/dL — ABNORMAL LOW (ref 8.9–10.3)
Chloride: 107 mmol/L (ref 98–111)
Creatinine: 0.83 mg/dL (ref 0.44–1.00)
GFR, Estimated: >60 mL/min (ref >60)
Glucose, Bld: 123 mg/dL — ABNORMAL HIGH (ref 70–99)
Potassium: 3.7 mmol/L (ref 3.5–5.1)
Sodium: 138 mmol/L (ref 135–145)
Total Bilirubin: 0.2 mg/dL — ABNORMAL LOW (ref 0.3–1.2)
Total Protein: 6.3 g/dL — ABNORMAL LOW (ref 6.5–8.1)

## 2020-04-12 LAB — ABO/RH: ABO/RH(D): A POS

## 2020-04-12 LAB — SAMPLE TO BLOOD BANK

## 2020-04-12 LAB — PREPARE RBC (CROSSMATCH)

## 2020-04-12 MED ORDER — SODIUM CHLORIDE 0.9% FLUSH
10.0000 mL | Freq: Once | INTRAVENOUS | Status: AC
  Administered 2020-04-12: 10 mL
  Filled 2020-04-12: qty 10

## 2020-04-12 MED ORDER — SODIUM CHLORIDE 0.9% FLUSH
10.0000 mL | INTRAVENOUS | Status: AC | PRN
  Administered 2020-04-12: 10 mL
  Filled 2020-04-12: qty 10

## 2020-04-12 MED ORDER — HEPARIN SOD (PORK) LOCK FLUSH 100 UNIT/ML IV SOLN
500.0000 [IU] | Freq: Once | INTRAVENOUS | Status: AC
  Administered 2020-04-12: 500 [IU]
  Filled 2020-04-12: qty 5

## 2020-04-12 MED ORDER — DIPHENHYDRAMINE HCL 25 MG PO CAPS
ORAL_CAPSULE | ORAL | Status: AC
  Filled 2020-04-12: qty 1

## 2020-04-12 MED ORDER — SODIUM CHLORIDE 0.9% IV SOLUTION
250.0000 mL | Freq: Once | INTRAVENOUS | Status: DC
  Filled 2020-04-12: qty 250

## 2020-04-12 MED ORDER — HEPARIN SOD (PORK) LOCK FLUSH 100 UNIT/ML IV SOLN
500.0000 [IU] | Freq: Every day | INTRAVENOUS | Status: AC | PRN
  Administered 2020-04-12: 500 [IU]
  Filled 2020-04-12: qty 5

## 2020-04-12 MED ORDER — DIPHENHYDRAMINE HCL 25 MG PO CAPS
25.0000 mg | ORAL_CAPSULE | Freq: Once | ORAL | Status: AC
  Administered 2020-04-12: 25 mg via ORAL

## 2020-04-12 NOTE — Patient Instructions (Addendum)

## 2020-04-12 NOTE — Progress Notes (Signed)
Patient requesting to speak with a nurse prior to initial radiation treatment. Patient accompanied by her mother. Patient questions, "do I have to take radiation since I am eating well, not throwing up and feeling much better." Referenced radiation consult note. Explained that per the consult note radiation is recommended at this time for maximum benefit of all her cancer treatment. Answered all questions to the best of my ability. Patient agreed to begin treatment. Patient's mother wheeled patient back to L1 for treatment.

## 2020-04-12 NOTE — Progress Notes (Signed)
Notified blood bank of transfusion orders. They will call when blood is ready. Informed tech in [place] [facility] to have patient come upstairs for transfusion today per Dr. Truett Perna orders.

## 2020-04-12 NOTE — Telephone Encounter (Signed)
Received critical lab value at 1153- Hgb 6.0.  MD Sherrill's office made aware.

## 2020-04-12 NOTE — Progress Notes (Signed)
Per Dr. Benay Spice, 2nd unit PRBC may be infused at 300 mL/hr after first 15 mins.

## 2020-04-13 ENCOUNTER — Inpatient Hospital Stay: Payer: Medicare Other

## 2020-04-13 ENCOUNTER — Other Ambulatory Visit: Payer: Self-pay

## 2020-04-13 ENCOUNTER — Ambulatory Visit
Admission: RE | Admit: 2020-04-13 | Discharge: 2020-04-13 | Disposition: A | Discharge status: Home / Self Care | Payer: Medicare Other | Source: Ambulatory Visit | Attending: Radiation Oncology | Admitting: Radiation Oncology

## 2020-04-13 ENCOUNTER — Inpatient Hospital Stay (HOSPITAL_BASED_OUTPATIENT_CLINIC_OR_DEPARTMENT_OTHER): Payer: Medicare Other | Admitting: Oncology

## 2020-04-13 ENCOUNTER — Ambulatory Visit: Payer: Medicare Other

## 2020-04-13 ENCOUNTER — Other Ambulatory Visit: Payer: Medicare Other

## 2020-04-13 ENCOUNTER — Ambulatory Visit: Payer: Medicare Other | Admitting: Oncology

## 2020-04-13 VITALS — BP 128/68 | HR 102 | Temp 97.7°F | Resp 18 | Ht 64.0 in | Wt 196.6 lb

## 2020-04-13 VITALS — HR 84

## 2020-04-13 DIAGNOSIS — C169 Malignant neoplasm of stomach, unspecified: Secondary | ICD-10-CM

## 2020-04-13 DIAGNOSIS — Z5112 Encounter for antineoplastic immunotherapy: Secondary | ICD-10-CM | POA: Diagnosis not present

## 2020-04-13 DIAGNOSIS — Z510 Encounter for antineoplastic radiation therapy: Secondary | ICD-10-CM | POA: Diagnosis not present

## 2020-04-13 LAB — TYPE AND SCREEN
ABO/RH(D): A POS
Antibody Screen: NEGATIVE
Unit division: 0
Unit division: 0

## 2020-04-13 LAB — BPAM RBC
Blood Product Expiration Date: 202201132359
Blood Product Expiration Date: 202201132359
ISSUE DATE / TIME: 202112271405
ISSUE DATE / TIME: 202112271405
Unit Type and Rh: 6200
Unit Type and Rh: 6200

## 2020-04-13 MED ORDER — SODIUM CHLORIDE 0.9 % IV SOLN
60.0000 mg/m2 | Freq: Once | INTRAVENOUS | Status: AC
  Administered 2020-04-13: 126 mg via INTRAVENOUS
  Filled 2020-04-13: qty 21

## 2020-04-13 MED ORDER — SODIUM CHLORIDE 0.9% FLUSH
10.0000 mL | INTRAVENOUS | Status: DC | PRN
  Administered 2020-04-13: 10 mL
  Filled 2020-04-13: qty 10

## 2020-04-13 MED ORDER — SODIUM CHLORIDE 0.9 % IV SOLN
10.0000 mg | Freq: Once | INTRAVENOUS | Status: AC
  Administered 2020-04-13: 10 mg via INTRAVENOUS
  Filled 2020-04-13: qty 10

## 2020-04-13 MED ORDER — SODIUM CHLORIDE 0.9 % IV SOLN
Freq: Once | INTRAVENOUS | Status: AC
  Filled 2020-04-13: qty 250

## 2020-04-13 MED ORDER — FAMOTIDINE IN NACL 20-0.9 MG/50ML-% IV SOLN
20.0000 mg | Freq: Once | INTRAVENOUS | Status: AC
  Administered 2020-04-13: 20 mg via INTRAVENOUS

## 2020-04-13 MED ORDER — SODIUM CHLORIDE 0.9 % IV SOLN
8.0000 mg/kg | Freq: Once | INTRAVENOUS | Status: AC
  Administered 2020-04-13: 700 mg via INTRAVENOUS
  Filled 2020-04-13: qty 20

## 2020-04-13 MED ORDER — HEPARIN SOD (PORK) LOCK FLUSH 100 UNIT/ML IV SOLN
500.0000 [IU] | Freq: Once | INTRAVENOUS | Status: AC | PRN
  Administered 2020-04-13: 500 [IU]
  Filled 2020-04-13: qty 5

## 2020-04-13 MED ORDER — FAMOTIDINE IN NACL 20-0.9 MG/50ML-% IV SOLN
INTRAVENOUS | Status: AC
  Filled 2020-04-13: qty 50

## 2020-04-13 MED ORDER — DIPHENHYDRAMINE HCL 50 MG/ML IJ SOLN
25.0000 mg | Freq: Once | INTRAMUSCULAR | Status: AC
  Administered 2020-04-13: 25 mg via INTRAVENOUS

## 2020-04-13 MED ORDER — DIPHENHYDRAMINE HCL 50 MG/ML IJ SOLN
INTRAMUSCULAR | Status: AC
  Filled 2020-04-13: qty 1

## 2020-04-13 NOTE — Progress Notes (Signed)
Per Dr. Truett Perna: OK to treat with Hgb on 04/12/20 of 6.0--she received transfusion of 2 units. OK to treat without a urine protein--patient has hematuria and will always test positive.

## 2020-04-13 NOTE — Progress Notes (Signed)
[place] OFFICE PROGRESS NOTE   Diagnosis: Gastric cancer  INTERVAL HISTORY:   [name] returns as scheduled.  She completed another treatment with Taxol/ramucirumab on 03/30/2020.  She began palliative radiation to the stomach yesterday.  She developed symptomatic anemia over the past week.  She was transfused with 2 units of packed red blood cells yesterday.  She reports feeling better today.  Objective:  Vital signs in last 24 hours:  Blood pressure 128/68, pulse (!) 102, temperature 97.7 F (36.5 C), temperature source Tympanic, resp. rate 18, height 5' 4" (1.626 m), weight 196 lb 9.6 oz (89.2 kg), SpO2 100 %.    HEENT: No thrush or ulcers Resp: Lungs clear bilaterally Cardio: Regular rate and rhythm GI: No hepatosplenomegaly, no mass, nontender Vascular: No leg edema    Portacath/PICC-without erythema  Lab Results:  Lab Results  Component Value Date   WBC 4.0 04/12/2020   HGB 6.0 (LL) 04/12/2020   HCT 20.2 (L) 04/12/2020   MCV 81.5 04/12/2020   PLT 320 04/12/2020   NEUTROABS 2.2 04/12/2020    CMP  Lab Results  Component Value Date   NA 138 04/12/2020   K 3.7 04/12/2020   CL 107 04/12/2020   CO2 26 04/12/2020   GLUCOSE 123 (H) 04/12/2020   BUN 13 04/12/2020   CREATININE 0.83 04/12/2020   CALCIUM 8.4 (L) 04/12/2020   PROT 6.3 (L) 04/12/2020   ALBUMIN 3.0 (L) 04/12/2020   AST 15 04/12/2020   ALT 8 04/12/2020   ALKPHOS 55 04/12/2020   BILITOT 0.2 (L) 04/12/2020   GFRNONAA >60 04/12/2020   GFRAA >60 01/06/2020    Lab Results  Component Value Date   CEA1 3.45 09/04/2019     Medications: I have reviewed the patient's current medications.   Assessment/Plan: 1. Gastric cancer ? Presenting with dysphagia spring 2019 ? 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 CTs revealed wall thickening and luminal narrowing of the colon at the hepatic flexure and cecum ? PET scan with no evidence of distant metastatic disease or abnormal uptake other than thickening of the distal esophagus and proximal stomach ? 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 CTs 10/23/2017-diffuse prominence of the gastric wall, especially the antrum, focal wall thickening of the distal ascending colon and hepatic flexure, thickening of the cecum, nonspecific haziness of the mesenteric fat in the pelvis, mild prominence of lymph nodes at the greater curvature of the stomach ? Treatment with FOLFOX beginning July 2019 ? CTs October 2019-stable disease, FOLFOX continued ? CTs December 2019-stable disease ? January 2020 treatment transition to Xeloda maintenance, care transition to [place] ? CTs in June 2020 in September 2020-stable disease, Xeloda continued ? CTs 06/23/2019-nonspecific thickening of the GE junction, intraluminal bladder mass ? Cystoscopy-metastatic signet ring cell adenocarcinoma ? Cycle 1 day 1 ramucirumab/paclitaxel 08/19/2019(given atanother facility) ? Cycle 1 day 1 ramucirumab/paclitaxel 09/04/2019 ? Cycle 1 day 8 Taxol 09/10/19 ? Cycle 1 day 15 ramucirumab/paclitaxel 6/2/2021canceled d/t neutropenia ?  Ramucirumab/pactlitaxelevery 2 weeks 09/24/2019 ? Ramucirumab/paclitaxel 10/08/2019 ? Ramucirumab/paclitaxel 10/22/2019 ? Ramucirumab/paclitaxel 11/05/2019 ? CTs 11/24/2019-unchanged  thickening of the distal esophagus/upper stomach, gastric body and antrum. Unchanged thickening of the transverse colon at the hepatic flexure, unchanged thickening of the urinary bladder, no evidence of progressive disease ? Ramucirumab/paclitaxel 11/25/2019 ? Ramucirumab/paclitaxel 12/09/2019 ? Ramucirumab/paclitaxel 12/23/2019 ? Ramucirumab/paclitaxel9/21/2021 ? Ramucirumab/paclitaxel 01/20/2020 ? Ramucirumab/paclitaxel 02/04/2020 ? Ramucirumab/paclitaxel 02/17/2020 ? Ramucirumab/paclitaxel 03/02/2020 ? Ramucirumab/paclitaxel 03/16/2020 ? CTs 03/28/2020-stable thickening of the distal esophagus, GE junction, gastric body, and antrum.  Decreased soft tissue thickening of the transverse colon at the hepatic flexure, no evidence of metastatic disease to the chest ? Ramucirumab/paclitaxel 03/30/2020 ? SBRT to the stomach starting 04/12/2020 ? Ramucirumab/paclitaxel 04/05/2020  2.Left breast cancer 2008,pT1c,pN0, status post a left lumpectomy with adjuvant chemotherapy and radiation, ER positive, PR positive, HER-2 positive  3.Mixed connective tissue disease/SLE  4.Lower extremity deep vein thrombosis maintained on apixaban  5.Family history of multiple cancers including breast and ovarian cancer  6.Dysphagia and intermittent vomiting secondary to #1  7.Hypertension  8.Peripheral neuropathy  9.Masslike fullness at the posterior right parotid/angle of the jaw  10. Dyspnea on exertion, ongoing for about a month, worsened after taxol/ramucirumab on 09/04/19 requiring ED visit on 5/23. CBC, CMP, troponin, BNP and chest xray negative  11.  Anemia likely secondary to combination of chemotherapy and blood loss  2 units packed red blood cells 04/12/2020     Disposition: [name] developed symptomatic anemia last week.  She was transfused with 2 units of packed red cells yesterday and feels better.  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 She will complete another treatment with Taxol/ramucirumab today.  She continues palliative radiation to the stomach.  I will ask radiation oncology to consider palliative treatment to the bladder.  She will return for an office visit in the next cycle of chemotherapy on 04/27/2020.  We will check a CBC when she is here for radiation next week.  Betsy Coder, MD  04/13/2020  9:47 AM

## 2020-04-13 NOTE — Patient Instructions (Signed)
[facility] Discharge Instructions for Patients Receiving Chemotherapy  Today you received the following chemotherapy agents: cyramza, paclitaxel.  To help prevent nausea and vomiting after your treatment, we encourage you to take your nausea medication as directed.    If you develop nausea and vomiting that is not controlled by your nausea medication, call the clinic.   BELOW ARE SYMPTOMS THAT SHOULD BE REPORTED IMMEDIATELY:  *FEVER GREATER THAN 100.5 F  *CHILLS WITH OR WITHOUT FEVER  NAUSEA AND VOMITING THAT IS NOT CONTROLLED WITH YOUR NAUSEA MEDICATION  *UNUSUAL SHORTNESS OF BREATH  *UNUSUAL BRUISING OR BLEEDING  TENDERNESS IN MOUTH AND THROAT WITH OR WITHOUT PRESENCE OF ULCERS  *URINARY PROBLEMS  *BOWEL PROBLEMS  UNUSUAL RASH Items with * indicate a potential emergency and should be followed up as soon as possible.  Feel free to call the clinic should you have any questions or concerns. The clinic phone number is [phone].  Please show the CHEMO ALERT CARD at check-in to the Emergency Department and triage nurse.   

## 2020-04-14 ENCOUNTER — Telehealth: Payer: Self-pay | Admitting: Oncology

## 2020-04-14 ENCOUNTER — Ambulatory Visit
Admission: RE | Admit: 2020-04-14 | Discharge: 2020-04-14 | Disposition: A | Discharge status: Home / Self Care | Payer: Medicare Other | Source: Ambulatory Visit | Attending: Radiation Oncology | Admitting: Radiation Oncology

## 2020-04-14 ENCOUNTER — Other Ambulatory Visit: Payer: Self-pay

## 2020-04-14 DIAGNOSIS — C169 Malignant neoplasm of stomach, unspecified: Secondary | ICD-10-CM

## 2020-04-14 DIAGNOSIS — Z510 Encounter for antineoplastic radiation therapy: Secondary | ICD-10-CM | POA: Diagnosis not present

## 2020-04-14 NOTE — Telephone Encounter (Signed)
Scheduled appointments per 12/28 los. Called patient, no answer, voicemail was full so was unable to leave a message. Mailed updated calendar to patient with appointments dates and times.

## 2020-04-15 ENCOUNTER — Other Ambulatory Visit: Payer: Self-pay | Admitting: *Deleted

## 2020-04-15 ENCOUNTER — Ambulatory Visit
Admission: RE | Admit: 2020-04-15 | Discharge: 2020-04-15 | Disposition: A | Discharge status: Home / Self Care | Payer: Medicare Other | Source: Ambulatory Visit | Attending: Radiation Oncology | Admitting: Radiation Oncology

## 2020-04-15 DIAGNOSIS — D649 Anemia, unspecified: Secondary | ICD-10-CM

## 2020-04-15 DIAGNOSIS — Z510 Encounter for antineoplastic radiation therapy: Secondary | ICD-10-CM | POA: Diagnosis not present

## 2020-04-15 DIAGNOSIS — C169 Malignant neoplasm of stomach, unspecified: Secondary | ICD-10-CM

## 2020-04-19 ENCOUNTER — Inpatient Hospital Stay: Payer: Medicare Other

## 2020-04-19 ENCOUNTER — Ambulatory Visit: Payer: Medicare Other | Admitting: Radiation Oncology

## 2020-04-19 ENCOUNTER — Telehealth: Payer: Self-pay | Admitting: Nurse Practitioner

## 2020-04-19 NOTE — Telephone Encounter (Signed)
Cancelled appointment for today per 1/3 schedule message. Patient is aware.

## 2020-04-20 ENCOUNTER — Ambulatory Visit: Payer: Medicare Other

## 2020-04-20 ENCOUNTER — Ambulatory Visit
Admission: RE | Admit: 2020-04-20 | Discharge: 2020-04-20 | Disposition: A | Discharge status: Home / Self Care | Payer: Medicare Other | Source: Ambulatory Visit | Attending: Radiation Oncology | Admitting: Radiation Oncology

## 2020-04-20 DIAGNOSIS — Z510 Encounter for antineoplastic radiation therapy: Secondary | ICD-10-CM | POA: Insufficient documentation

## 2020-04-20 DIAGNOSIS — C168 Malignant neoplasm of overlapping sites of stomach: Secondary | ICD-10-CM | POA: Insufficient documentation

## 2020-04-21 ENCOUNTER — Inpatient Hospital Stay: Payer: Medicare Other | Attending: Oncology

## 2020-04-21 ENCOUNTER — Inpatient Hospital Stay: Payer: Medicare Other

## 2020-04-21 ENCOUNTER — Ambulatory Visit
Admission: RE | Admit: 2020-04-21 | Discharge: 2020-04-21 | Disposition: A | Discharge status: Home / Self Care | Payer: Medicare Other | Source: Ambulatory Visit | Attending: Radiation Oncology | Admitting: Radiation Oncology

## 2020-04-21 ENCOUNTER — Other Ambulatory Visit: Payer: Self-pay

## 2020-04-21 DIAGNOSIS — Z95828 Presence of other vascular implants and grafts: Secondary | ICD-10-CM

## 2020-04-21 DIAGNOSIS — C169 Malignant neoplasm of stomach, unspecified: Secondary | ICD-10-CM | POA: Diagnosis present

## 2020-04-21 DIAGNOSIS — Z853 Personal history of malignant neoplasm of breast: Secondary | ICD-10-CM | POA: Insufficient documentation

## 2020-04-21 DIAGNOSIS — G629 Polyneuropathy, unspecified: Secondary | ICD-10-CM | POA: Insufficient documentation

## 2020-04-21 DIAGNOSIS — Z5112 Encounter for antineoplastic immunotherapy: Secondary | ICD-10-CM | POA: Diagnosis present

## 2020-04-21 DIAGNOSIS — Z5111 Encounter for antineoplastic chemotherapy: Secondary | ICD-10-CM | POA: Diagnosis present

## 2020-04-21 DIAGNOSIS — R634 Abnormal weight loss: Secondary | ICD-10-CM | POA: Insufficient documentation

## 2020-04-21 DIAGNOSIS — I10 Essential (primary) hypertension: Secondary | ICD-10-CM | POA: Insufficient documentation

## 2020-04-21 DIAGNOSIS — D649 Anemia, unspecified: Secondary | ICD-10-CM | POA: Diagnosis not present

## 2020-04-21 DIAGNOSIS — Z510 Encounter for antineoplastic radiation therapy: Secondary | ICD-10-CM | POA: Diagnosis not present

## 2020-04-21 DIAGNOSIS — Z452 Encounter for adjustment and management of vascular access device: Secondary | ICD-10-CM | POA: Diagnosis not present

## 2020-04-21 LAB — CBC WITH DIFFERENTIAL (CANCER CENTER ONLY)
Abs Immature Granulocytes: 0.03 10*3/uL (ref 0.00–0.07)
Basophils Absolute: 0 10*3/uL (ref 0.0–0.1)
Basophils Relative: 0 %
Eosinophils Absolute: 0 10*3/uL (ref 0.0–0.5)
Eosinophils Relative: 0 %
HCT: 27.5 % — ABNORMAL LOW (ref 36.0–46.0)
Hemoglobin: 8.7 g/dL — ABNORMAL LOW (ref 12.0–15.0)
Immature Granulocytes: 1 %
Lymphocytes Relative: 11 %
Lymphs Abs: 0.4 10*3/uL — ABNORMAL LOW (ref 0.7–4.0)
MCH: 26 pg (ref 26.0–34.0)
MCHC: 31.6 g/dL (ref 30.0–36.0)
MCV: 82.1 fL (ref 80.0–100.0)
Monocytes Absolute: 0.4 10*3/uL (ref 0.1–1.0)
Monocytes Relative: 11 %
Neutro Abs: 2.5 10*3/uL (ref 1.7–7.7)
Neutrophils Relative %: 77 %
Platelet Count: 277 10*3/uL (ref 150–400)
RBC: 3.35 MIL/uL — ABNORMAL LOW (ref 3.87–5.11)
RDW: 19.9 % — ABNORMAL HIGH (ref 11.5–15.5)
WBC Count: 3.3 10*3/uL — ABNORMAL LOW (ref 4.0–10.5)
nRBC: 0 % (ref 0.0–0.2)

## 2020-04-21 LAB — SAMPLE TO BLOOD BANK

## 2020-04-21 MED ORDER — SODIUM CHLORIDE 0.9% FLUSH
10.0000 mL | Freq: Once | INTRAVENOUS | Status: AC
  Administered 2020-04-21: 10 mL
  Filled 2020-04-21: qty 10

## 2020-04-21 MED ORDER — HEPARIN SOD (PORK) LOCK FLUSH 100 UNIT/ML IV SOLN
500.0000 [IU] | Freq: Once | INTRAVENOUS | Status: AC
  Administered 2020-04-21: 500 [IU]
  Filled 2020-04-21: qty 5

## 2020-04-21 NOTE — Patient Instructions (Signed)

## 2020-04-22 ENCOUNTER — Ambulatory Visit
Admission: RE | Admit: 2020-04-22 | Discharge: 2020-04-22 | Disposition: A | Discharge status: Home / Self Care | Payer: Medicare Other | Source: Ambulatory Visit | Attending: Radiation Oncology | Admitting: Radiation Oncology

## 2020-04-22 DIAGNOSIS — Z510 Encounter for antineoplastic radiation therapy: Secondary | ICD-10-CM | POA: Diagnosis not present

## 2020-04-23 ENCOUNTER — Other Ambulatory Visit: Payer: Self-pay

## 2020-04-23 ENCOUNTER — Ambulatory Visit
Admission: RE | Admit: 2020-04-23 | Discharge: 2020-04-23 | Disposition: A | Discharge status: Home / Self Care | Payer: Medicare Other | Source: Ambulatory Visit | Attending: Radiation Oncology | Admitting: Radiation Oncology

## 2020-04-23 DIAGNOSIS — Z510 Encounter for antineoplastic radiation therapy: Secondary | ICD-10-CM | POA: Diagnosis not present

## 2020-04-25 ENCOUNTER — Other Ambulatory Visit: Payer: Self-pay | Admitting: Oncology

## 2020-04-26 ENCOUNTER — Other Ambulatory Visit: Payer: Self-pay

## 2020-04-26 ENCOUNTER — Ambulatory Visit
Admission: RE | Admit: 2020-04-26 | Discharge: 2020-04-26 | Disposition: A | Discharge status: Home / Self Care | Payer: Medicare Other | Source: Ambulatory Visit | Attending: Radiation Oncology | Admitting: Radiation Oncology

## 2020-04-26 DIAGNOSIS — Z510 Encounter for antineoplastic radiation therapy: Secondary | ICD-10-CM | POA: Diagnosis not present

## 2020-04-27 ENCOUNTER — Other Ambulatory Visit: Payer: Self-pay

## 2020-04-27 ENCOUNTER — Inpatient Hospital Stay: Payer: Medicare Other

## 2020-04-27 ENCOUNTER — Inpatient Hospital Stay (HOSPITAL_BASED_OUTPATIENT_CLINIC_OR_DEPARTMENT_OTHER): Payer: Medicare Other | Admitting: Nurse Practitioner

## 2020-04-27 ENCOUNTER — Ambulatory Visit: Payer: Medicare Other | Admitting: Oncology

## 2020-04-27 ENCOUNTER — Ambulatory Visit: Payer: Medicare Other

## 2020-04-27 ENCOUNTER — Other Ambulatory Visit: Payer: Medicare Other

## 2020-04-27 ENCOUNTER — Encounter: Payer: Self-pay | Admitting: Nurse Practitioner

## 2020-04-27 ENCOUNTER — Ambulatory Visit
Admission: RE | Admit: 2020-04-27 | Discharge: 2020-04-27 | Disposition: A | Discharge status: Home / Self Care | Payer: Medicare Other | Source: Ambulatory Visit | Attending: Radiation Oncology | Admitting: Radiation Oncology

## 2020-04-27 VITALS — HR 92

## 2020-04-27 VITALS — BP 122/56 | HR 103 | Temp 97.0°F | Resp 18 | Ht 64.0 in | Wt 188.4 lb

## 2020-04-27 DIAGNOSIS — C169 Malignant neoplasm of stomach, unspecified: Secondary | ICD-10-CM

## 2020-04-27 DIAGNOSIS — Z5112 Encounter for antineoplastic immunotherapy: Secondary | ICD-10-CM | POA: Diagnosis not present

## 2020-04-27 DIAGNOSIS — Z95828 Presence of other vascular implants and grafts: Secondary | ICD-10-CM

## 2020-04-27 DIAGNOSIS — Z510 Encounter for antineoplastic radiation therapy: Secondary | ICD-10-CM | POA: Diagnosis not present

## 2020-04-27 LAB — CBC WITH DIFFERENTIAL (CANCER CENTER ONLY)
Abs Immature Granulocytes: 0.01 10*3/uL (ref 0.00–0.07)
Basophils Absolute: 0 10*3/uL (ref 0.0–0.1)
Basophils Relative: 0 %
Eosinophils Absolute: 0.1 10*3/uL (ref 0.0–0.5)
Eosinophils Relative: 2 %
HCT: 29.5 % — ABNORMAL LOW (ref 36.0–46.0)
Hemoglobin: 9.1 g/dL — ABNORMAL LOW (ref 12.0–15.0)
Immature Granulocytes: 1 %
Lymphocytes Relative: 14 %
Lymphs Abs: 0.3 10*3/uL — ABNORMAL LOW (ref 0.7–4.0)
MCH: 25.3 pg — ABNORMAL LOW (ref 26.0–34.0)
MCHC: 30.8 g/dL (ref 30.0–36.0)
MCV: 82.2 fL (ref 80.0–100.0)
Monocytes Absolute: 0.5 10*3/uL (ref 0.1–1.0)
Monocytes Relative: 22 %
Neutro Abs: 1.4 10*3/uL — ABNORMAL LOW (ref 1.7–7.7)
Neutrophils Relative %: 61 %
Platelet Count: 313 10*3/uL (ref 150–400)
RBC: 3.59 MIL/uL — ABNORMAL LOW (ref 3.87–5.11)
RDW: 20.3 % — ABNORMAL HIGH (ref 11.5–15.5)
WBC Count: 2.2 10*3/uL — ABNORMAL LOW (ref 4.0–10.5)
nRBC: 0 % (ref 0.0–0.2)

## 2020-04-27 LAB — CMP (CANCER CENTER ONLY)
ALT: 6 U/L (ref 0–44)
AST: 13 U/L — ABNORMAL LOW (ref 15–41)
Albumin: 3.2 g/dL — ABNORMAL LOW (ref 3.5–5.0)
Alkaline Phosphatase: 58 U/L (ref 38–126)
Anion gap: 7 (ref 5–15)
BUN: 10 mg/dL (ref 6–20)
CO2: 26 mmol/L (ref 22–32)
Calcium: 8.7 mg/dL — ABNORMAL LOW (ref 8.9–10.3)
Chloride: 105 mmol/L (ref 98–111)
Creatinine: 0.69 mg/dL (ref 0.44–1.00)
GFR, Estimated: >60 mL/min (ref >60)
Glucose, Bld: 129 mg/dL — ABNORMAL HIGH (ref 70–99)
Potassium: 3.6 mmol/L (ref 3.5–5.1)
Sodium: 138 mmol/L (ref 135–145)
Total Bilirubin: 0.4 mg/dL (ref 0.3–1.2)
Total Protein: 6.8 g/dL (ref 6.5–8.1)

## 2020-04-27 MED ORDER — DIPHENHYDRAMINE HCL 50 MG/ML IJ SOLN
INTRAMUSCULAR | Status: AC
  Filled 2020-04-27: qty 1

## 2020-04-27 MED ORDER — SODIUM CHLORIDE 0.9 % IV SOLN
60.0000 mg/m2 | Freq: Once | INTRAVENOUS | Status: AC
  Administered 2020-04-27: 126 mg via INTRAVENOUS
  Filled 2020-04-27: qty 21

## 2020-04-27 MED ORDER — SODIUM CHLORIDE 0.9% FLUSH
10.0000 mL | Freq: Once | INTRAVENOUS | Status: AC
  Administered 2020-04-27: 10 mL
  Filled 2020-04-27: qty 10

## 2020-04-27 MED ORDER — SODIUM CHLORIDE 0.9% FLUSH
10.0000 mL | INTRAVENOUS | Status: DC | PRN
  Administered 2020-04-27: 10 mL
  Filled 2020-04-27: qty 10

## 2020-04-27 MED ORDER — FAMOTIDINE IN NACL 20-0.9 MG/50ML-% IV SOLN
20.0000 mg | Freq: Once | INTRAVENOUS | Status: AC
  Administered 2020-04-27: 20 mg via INTRAVENOUS

## 2020-04-27 MED ORDER — FAMOTIDINE IN NACL 20-0.9 MG/50ML-% IV SOLN
INTRAVENOUS | Status: AC
  Filled 2020-04-27: qty 50

## 2020-04-27 MED ORDER — DIPHENHYDRAMINE HCL 50 MG/ML IJ SOLN
25.0000 mg | Freq: Once | INTRAMUSCULAR | Status: AC
  Administered 2020-04-27: 25 mg via INTRAVENOUS

## 2020-04-27 MED ORDER — SODIUM CHLORIDE 0.9 % IV SOLN
10.0000 mg | Freq: Once | INTRAVENOUS | Status: AC
  Administered 2020-04-27: 10 mg via INTRAVENOUS
  Filled 2020-04-27: qty 10

## 2020-04-27 MED ORDER — HEPARIN SOD (PORK) LOCK FLUSH 100 UNIT/ML IV SOLN
500.0000 [IU] | Freq: Once | INTRAVENOUS | Status: AC | PRN
  Administered 2020-04-27: 500 [IU]
  Filled 2020-04-27: qty 5

## 2020-04-27 MED ORDER — SODIUM CHLORIDE 0.9 % IV SOLN
Freq: Once | INTRAVENOUS | Status: AC
  Filled 2020-04-27: qty 250

## 2020-04-27 MED ORDER — SODIUM CHLORIDE 0.9 % IV SOLN
8.0000 mg/kg | Freq: Once | INTRAVENOUS | Status: AC
  Administered 2020-04-27: 700 mg via INTRAVENOUS
  Filled 2020-04-27: qty 50

## 2020-04-27 MED ORDER — METOCLOPRAMIDE HCL 10 MG PO TABS: 10.0000 mg | ORAL_TABLET | Freq: Three times a day (TID) | ORAL | 0 refills | Status: DC

## 2020-04-27 NOTE — Progress Notes (Signed)
Okay to treat with ANC and no urine protein per Ned Card NP

## 2020-04-27 NOTE — Progress Notes (Signed)
[place] OFFICE PROGRESS NOTE   Diagnosis: Gastric cancer  INTERVAL HISTORY:   [name] returns as scheduled.  She completed another cycle of Taxol/ramucirumab 04/13/2020.  She is completing a course of SBRT to the gastric region.  She continues to have intermittent nausea/vomiting.  She notes less episodes of regurgitation.  She describes her appetite is good.  Her mother disagrees.  No mouth sores.  No diarrhea.  No bleeding except with urine.  She denies fever, cough, shortness of breath.  Objective:  Vital signs in last 24 hours:  Blood pressure (!) 122/56, pulse (!) 103, temperature (!) 97 F (36.1 C), temperature source Tympanic, resp. rate 18, height _0  (1.626 m), weight 188 lb 6.4 oz (85.5 kg), SpO2 99 %.    HEENT: No thrush or ulcers. Resp: Lungs clear bilaterally. Cardio: Regular rate and rhythm. GI: Abdomen soft and nontender.  No hepatosplenomegaly.  No mass. Vascular: No leg edema.  Port-A-Cath without erythema  Lab Results:  Lab Results  Component Value Date   WBC 2.2 (L) 04/27/2020   HGB 9.1 (L) 04/27/2020   HCT 29.5 (L) 04/27/2020   MCV 82.2 04/27/2020   PLT 313 04/27/2020   NEUTROABS 1.4 (L) 04/27/2020    Imaging:  No results found.  Medications: I have reviewed the patient's current medications.  Assessment/Plan: 1. Gastric cancer ? Presenting with dysphagia spring 2019 ? 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 CTs revealed wall thickening and luminal narrowing of the colon at the hepatic flexure and cecum ? PET scan with no evidence of distant metastatic disease or abnormal uptake other than thickening of the distal esophagus and  proximal stomach ? 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 CTs 10/23/2017-diffuse prominence of the gastric wall, especially the antrum, focal wall thickening of the distal ascending colon and hepatic flexure, thickening of the cecum, nonspecific haziness of the mesenteric fat in the pelvis, mild prominence of lymph nodes at the greater curvature of the stomach ? Treatment with FOLFOX beginning July 2019 ? CTs October 2019-stable disease, FOLFOX continued ? CTs December 2019-stable disease ? January 2020 treatment transition to Xeloda maintenance, care transition to [place] ? CTs in June 2020 in September 2020-stable disease, Xeloda continued ? CTs 06/23/2019-nonspecific thickening of the GE junction, intraluminal bladder mass ? Cystoscopy-metastatic signet ring cell adenocarcinoma ? Cycle 1 day 1 ramucirumab/paclitaxel 08/19/2019(given atanother facility) ? Cycle 1 day 1 ramucirumab/paclitaxel 09/04/2019 ? Cycle 1 day 8 Taxol 09/10/19 ? Cycle 1 day 15 ramucirumab/paclitaxel 6/2/2021canceled d/t neutropenia ? Ramucirumab/pactlitaxelevery 2 weeks 09/24/2019 ? Ramucirumab/paclitaxel 10/08/2019 ? Ramucirumab/paclitaxel 10/22/2019 ? Ramucirumab/paclitaxel 11/05/2019 ? CTs 11/24/2019-unchanged thickening of the distal esophagus/upper stomach, gastric body and antrum. Unchanged thickening of the transverse colon at the hepatic flexure, unchanged thickening of the urinary bladder, no evidence of progressive disease ? Ramucirumab/paclitaxel 11/25/2019 ? Ramucirumab/paclitaxel 12/09/2019 ? Ramucirumab/paclitaxel  12/23/2019 ? Ramucirumab/paclitaxel9/21/2021 ? Ramucirumab/paclitaxel 01/20/2020 ? Ramucirumab/paclitaxel 02/04/2020 ? Ramucirumab/paclitaxel 02/17/2020 ? Ramucirumab/paclitaxel 03/02/2020 ? Ramucirumab/paclitaxel 03/16/2020 ? CTs 03/28/2020-stable thickening  of the distal esophagus, GE junction, gastric body, and antrum.  Decreased soft tissue thickening of the transverse colon at the hepatic flexure, no evidence of metastatic disease to the chest ? Ramucirumab/paclitaxel 03/30/2020 ? SBRT to the stomach starting 04/12/2020 ? Ramucirumab/paclitaxel 04/05/2020 ? Ramucirumab/paclitaxel 04/13/2020 ? Ramucirumab/paclitaxel 04/27/2020  2.Left breast cancer 2008,pT1c,pN0, status post a left lumpectomy with adjuvant chemotherapy and radiation, ER positive, PR positive, HER-2 positive  3.Mixed connective tissue disease/SLE  4.Lower extremity deep vein thrombosis maintained on apixaban  5.Family history of multiple cancers including breast and ovarian cancer  6.Dysphagia and intermittent vomiting secondary to #1  7.Hypertension  8.Peripheral neuropathy  9.Masslike fullness at the posterior right parotid/angle of the jaw  10. Dyspnea on exertion, ongoing for about a month, worsened after taxol/ramucirumab on 09/04/19 requiring ED visit on 5/23. CBC, CMP, troponin, BNP and chest xray negative  11.Anemia likely secondary to combination of chemotherapy and blood loss  2 units packed red blood cells 04/12/2020     Disposition: [name] appears stable.  She is completing SBRT to the gastric region.  She continues every 2-week Taxol/ramucirumab.  We will ask radiation oncology about treating the bladder.  We reviewed the CBC from today.  Counts adequate to proceed with treatment.  We discussed the weight loss.  She will focus on nutrition over the next 2 weeks.  She has an appointment with the [facility] dietitian today.  She declines the  COVID-vaccine.  She will return for lab, follow-up in Taxol/ramucirumab in 2 weeks.  She will contact the office in the interim with any problems.    Ned Card ANP/GNP-BC   04/27/2020  10:20 AM

## 2020-04-27 NOTE — Progress Notes (Signed)
Nutrition Follow-up:  Patient with gastric cancer, receiving chemotherapy and radiation.   Spoke with patient during infusion today, uncovered face from blanket when RD spoke.  Patient reports eating few bites of chicken noodle soup sitting at chairside during infusion.  Patient says that nausea is better.  Reports that she is drinking 2 juice type shakes per day. Denies mouth sores and bowels are moving normally     Medications: reviewed  Labs: reviewed  Anthropometrics:   Weight 188 lb decreased from 199 lb on 11/30  5% weight loss in the last month, significant   NUTRITION DIAGNOSIS: Unintentional weight loss continues   INTERVENTION:  Encouraged increasing juice shakes to TID if able to tolerate for more calories and protein.  Expressed concern for significant weight loss recently.      MONITORING, EVALUATION, GOAL: weight trends, intake   NEXT VISIT: Wednesday, Feb 9 during infusion  Vong Garringer B. Zenia Resides, [place], [place] Registered Dietitian [phone] (mobile)

## 2020-04-28 ENCOUNTER — Ambulatory Visit
Admission: RE | Admit: 2020-04-28 | Discharge: 2020-04-28 | Disposition: A | Discharge status: Home / Self Care | Payer: Medicare Other | Source: Ambulatory Visit | Attending: Radiation Oncology | Admitting: Radiation Oncology

## 2020-04-28 ENCOUNTER — Telehealth: Payer: Self-pay | Admitting: Nurse Practitioner

## 2020-04-28 DIAGNOSIS — Z510 Encounter for antineoplastic radiation therapy: Secondary | ICD-10-CM | POA: Diagnosis not present

## 2020-04-28 NOTE — Telephone Encounter (Signed)
Scheduled appointments per 1/11 los. Spoke to patient who is aware of appointments dates and times.  

## 2020-04-29 ENCOUNTER — Ambulatory Visit
Admission: RE | Admit: 2020-04-29 | Discharge: 2020-04-29 | Disposition: A | Discharge status: Home / Self Care | Payer: Medicare Other | Source: Ambulatory Visit | Attending: Radiation Oncology | Admitting: Radiation Oncology

## 2020-04-29 ENCOUNTER — Telehealth: Payer: Self-pay | Admitting: Radiation Oncology

## 2020-04-29 DIAGNOSIS — Z510 Encounter for antineoplastic radiation therapy: Secondary | ICD-10-CM | POA: Diagnosis not present

## 2020-04-29 NOTE — Telephone Encounter (Signed)
I spoke with the patient and she is still having bleeding from her bladder that is painless. Dr. Lisbeth Renshaw has recommended proceeding with palliative radiotherapy. She has been doing well with her palliative treatment to the stomach but is hoping to have a break from her stomach radiation prior to proceeding with simulation to her bladder. I'll ask our simulation staff to coordinate with her at her convenience but she will sign consent for treatment to the bladder tomorrow during her under treatment visit.

## 2020-04-30 ENCOUNTER — Ambulatory Visit
Admission: RE | Admit: 2020-04-30 | Discharge: 2020-04-30 | Disposition: A | Discharge status: Home / Self Care | Payer: Medicare Other | Source: Ambulatory Visit | Attending: Radiation Oncology | Admitting: Radiation Oncology

## 2020-04-30 DIAGNOSIS — Z510 Encounter for antineoplastic radiation therapy: Secondary | ICD-10-CM | POA: Diagnosis not present

## 2020-05-03 ENCOUNTER — Ambulatory Visit: Payer: Medicare Other

## 2020-05-04 ENCOUNTER — Ambulatory Visit: Payer: Medicare Other

## 2020-05-05 ENCOUNTER — Ambulatory Visit: Payer: Medicare Other

## 2020-05-05 ENCOUNTER — Ambulatory Visit
Admission: RE | Admit: 2020-05-05 | Discharge: 2020-05-05 | Disposition: A | Discharge status: Home / Self Care | Payer: Medicare Other | Source: Ambulatory Visit | Attending: Radiation Oncology | Admitting: Radiation Oncology

## 2020-05-05 DIAGNOSIS — Z510 Encounter for antineoplastic radiation therapy: Secondary | ICD-10-CM | POA: Diagnosis not present

## 2020-05-06 ENCOUNTER — Other Ambulatory Visit: Payer: Self-pay

## 2020-05-06 ENCOUNTER — Ambulatory Visit
Admission: RE | Admit: 2020-05-06 | Discharge: 2020-05-06 | Disposition: A | Discharge status: Home / Self Care | Payer: Medicare Other | Source: Ambulatory Visit | Attending: Radiation Oncology | Admitting: Radiation Oncology

## 2020-05-06 ENCOUNTER — Encounter: Payer: Self-pay | Admitting: Radiation Oncology

## 2020-05-06 DIAGNOSIS — Z510 Encounter for antineoplastic radiation therapy: Secondary | ICD-10-CM | POA: Diagnosis not present

## 2020-05-07 ENCOUNTER — Ambulatory Visit: Payer: Medicare Other

## 2020-05-09 ENCOUNTER — Other Ambulatory Visit: Payer: Self-pay | Admitting: Oncology

## 2020-05-10 ENCOUNTER — Ambulatory Visit: Payer: Medicare Other

## 2020-05-11 ENCOUNTER — Other Ambulatory Visit: Payer: Self-pay

## 2020-05-11 ENCOUNTER — Ambulatory Visit (HOSPITAL_BASED_OUTPATIENT_CLINIC_OR_DEPARTMENT_OTHER): Payer: Medicare Other | Admitting: Medical

## 2020-05-11 ENCOUNTER — Emergency Department (HOSPITAL_BASED_OUTPATIENT_CLINIC_OR_DEPARTMENT_OTHER): Payer: Medicare Other

## 2020-05-11 ENCOUNTER — Inpatient Hospital Stay: Payer: Medicare Other

## 2020-05-11 ENCOUNTER — Inpatient Hospital Stay (HOSPITAL_BASED_OUTPATIENT_CLINIC_OR_DEPARTMENT_OTHER)
Admission: EM | Admit: 2020-05-11 | Discharge: 2020-05-13 | DRG: 194 | Disposition: A | Discharge status: Home / Self Care | Payer: Medicare Other | Attending: Internal Medicine | Admitting: Internal Medicine

## 2020-05-11 ENCOUNTER — Encounter: Payer: Self-pay | Admitting: Nurse Practitioner

## 2020-05-11 ENCOUNTER — Encounter (HOSPITAL_BASED_OUTPATIENT_CLINIC_OR_DEPARTMENT_OTHER): Payer: Self-pay | Admitting: *Deleted

## 2020-05-11 ENCOUNTER — Inpatient Hospital Stay (HOSPITAL_BASED_OUTPATIENT_CLINIC_OR_DEPARTMENT_OTHER): Payer: Medicare Other | Admitting: Nurse Practitioner

## 2020-05-11 VITALS — BP 115/67 | HR 114 | Temp 97.7°F | Resp 16 | Ht 64.0 in | Wt 181.3 lb

## 2020-05-11 VITALS — BP 117/77 | HR 111 | Temp 97.7°F | Resp 16 | Ht 64.0 in | Wt 181.3 lb

## 2020-05-11 DIAGNOSIS — J189 Pneumonia, unspecified organism: Principal | ICD-10-CM | POA: Diagnosis present

## 2020-05-11 DIAGNOSIS — C169 Malignant neoplasm of stomach, unspecified: Secondary | ICD-10-CM

## 2020-05-11 DIAGNOSIS — I7300 Raynaud's syndrome without gangrene: Secondary | ICD-10-CM | POA: Diagnosis present

## 2020-05-11 DIAGNOSIS — Z85038 Personal history of other malignant neoplasm of large intestine: Secondary | ICD-10-CM

## 2020-05-11 DIAGNOSIS — Z7901 Long term (current) use of anticoagulants: Secondary | ICD-10-CM

## 2020-05-11 DIAGNOSIS — E871 Hypo-osmolality and hyponatremia: Secondary | ICD-10-CM | POA: Diagnosis present

## 2020-05-11 DIAGNOSIS — Z79899 Other long term (current) drug therapy: Secondary | ICD-10-CM

## 2020-05-11 DIAGNOSIS — R0902 Hypoxemia: Secondary | ICD-10-CM | POA: Diagnosis present

## 2020-05-11 DIAGNOSIS — Z85028 Personal history of other malignant neoplasm of stomach: Secondary | ICD-10-CM

## 2020-05-11 DIAGNOSIS — M329 Systemic lupus erythematosus, unspecified: Secondary | ICD-10-CM | POA: Diagnosis present

## 2020-05-11 DIAGNOSIS — R059 Cough, unspecified: Secondary | ICD-10-CM

## 2020-05-11 DIAGNOSIS — D709 Neutropenia, unspecified: Secondary | ICD-10-CM | POA: Diagnosis present

## 2020-05-11 DIAGNOSIS — Z853 Personal history of malignant neoplasm of breast: Secondary | ICD-10-CM

## 2020-05-11 DIAGNOSIS — R509 Fever, unspecified: Secondary | ICD-10-CM

## 2020-05-11 DIAGNOSIS — E876 Hypokalemia: Secondary | ICD-10-CM | POA: Diagnosis present

## 2020-05-11 DIAGNOSIS — Z95828 Presence of other vascular implants and grafts: Secondary | ICD-10-CM

## 2020-05-11 DIAGNOSIS — E860 Dehydration: Secondary | ICD-10-CM | POA: Diagnosis present

## 2020-05-11 DIAGNOSIS — Z20822 Contact with and (suspected) exposure to covid-19: Secondary | ICD-10-CM | POA: Diagnosis present

## 2020-05-11 DIAGNOSIS — C155 Malignant neoplasm of lower third of esophagus: Secondary | ICD-10-CM | POA: Diagnosis present

## 2020-05-11 DIAGNOSIS — R0602 Shortness of breath: Secondary | ICD-10-CM | POA: Diagnosis not present

## 2020-05-11 DIAGNOSIS — Z923 Personal history of irradiation: Secondary | ICD-10-CM

## 2020-05-11 DIAGNOSIS — Z791 Long term (current) use of non-steroidal anti-inflammatories (NSAID): Secondary | ICD-10-CM

## 2020-05-11 LAB — CBC WITH DIFFERENTIAL (CANCER CENTER ONLY)
Abs Immature Granulocytes: 0.05 10*3/uL (ref 0.00–0.07)
Basophils Absolute: 0 10*3/uL (ref 0.0–0.1)
Basophils Relative: 0 %
Eosinophils Absolute: 0 10*3/uL (ref 0.0–0.5)
Eosinophils Relative: 1 %
HCT: 27.8 % — ABNORMAL LOW (ref 36.0–46.0)
Hemoglobin: 8.6 g/dL — ABNORMAL LOW (ref 12.0–15.0)
Immature Granulocytes: 2 %
Lymphocytes Relative: 5 %
Lymphs Abs: 0.2 10*3/uL — ABNORMAL LOW (ref 0.7–4.0)
MCH: 26.1 pg (ref 26.0–34.0)
MCHC: 30.9 g/dL (ref 30.0–36.0)
MCV: 84.5 fL (ref 80.0–100.0)
Monocytes Absolute: 0.5 10*3/uL (ref 0.1–1.0)
Monocytes Relative: 13 %
Neutro Abs: 2.7 10*3/uL (ref 1.7–7.7)
Neutrophils Relative %: 79 %
Platelet Count: 352 10*3/uL (ref 150–400)
RBC: 3.29 MIL/uL — ABNORMAL LOW (ref 3.87–5.11)
RDW: 22.5 % — ABNORMAL HIGH (ref 11.5–15.5)
WBC Count: 3.4 10*3/uL — ABNORMAL LOW (ref 4.0–10.5)
nRBC: 0.9 % — ABNORMAL HIGH (ref 0.0–0.2)

## 2020-05-11 LAB — CMP (CANCER CENTER ONLY)
ALT: 7 U/L (ref 0–44)
AST: 13 U/L — ABNORMAL LOW (ref 15–41)
Albumin: 2.5 g/dL — ABNORMAL LOW (ref 3.5–5.0)
Alkaline Phosphatase: 62 U/L (ref 38–126)
Anion gap: 8 (ref 5–15)
BUN: 13 mg/dL (ref 6–20)
CO2: 28 mmol/L (ref 22–32)
Calcium: 8.2 mg/dL — ABNORMAL LOW (ref 8.9–10.3)
Chloride: 102 mmol/L (ref 98–111)
Creatinine: 0.63 mg/dL (ref 0.44–1.00)
GFR, Estimated: >60 mL/min (ref >60)
Glucose, Bld: 148 mg/dL — ABNORMAL HIGH (ref 70–99)
Potassium: 3.4 mmol/L — ABNORMAL LOW (ref 3.5–5.1)
Sodium: 138 mmol/L (ref 135–145)
Total Bilirubin: 0.3 mg/dL (ref 0.3–1.2)
Total Protein: 6.2 g/dL — ABNORMAL LOW (ref 6.5–8.1)

## 2020-05-11 LAB — COMPREHENSIVE METABOLIC PANEL
ALT: 8 U/L (ref 0–44)
AST: 15 U/L (ref 15–41)
Albumin: 2.5 g/dL — ABNORMAL LOW (ref 3.5–5.0)
Alkaline Phosphatase: 50 U/L (ref 38–126)
Anion gap: 8 (ref 5–15)
BUN: 13 mg/dL (ref 6–20)
CO2: 23 mmol/L (ref 22–32)
Calcium: 7.3 mg/dL — ABNORMAL LOW (ref 8.9–10.3)
Chloride: 102 mmol/L (ref 98–111)
Creatinine, Ser: 0.55 mg/dL (ref 0.44–1.00)
GFR, Estimated: >60 mL/min (ref >60)
Glucose, Bld: 92 mg/dL (ref 70–99)
Potassium: 3.1 mmol/L — ABNORMAL LOW (ref 3.5–5.1)
Sodium: 133 mmol/L — ABNORMAL LOW (ref 135–145)
Total Bilirubin: <0.1 mg/dL — ABNORMAL LOW (ref 0.3–1.2)
Total Protein: 5.7 g/dL — ABNORMAL LOW (ref 6.5–8.1)

## 2020-05-11 LAB — CBC WITH DIFFERENTIAL/PLATELET
Abs Immature Granulocytes: 0.05 10*3/uL (ref 0.00–0.07)
Basophils Absolute: 0 10*3/uL (ref 0.0–0.1)
Basophils Relative: 0 %
Eosinophils Absolute: 0 10*3/uL (ref 0.0–0.5)
Eosinophils Relative: 1 %
HCT: 27.9 % — ABNORMAL LOW (ref 36.0–46.0)
Hemoglobin: 8.7 g/dL — ABNORMAL LOW (ref 12.0–15.0)
Immature Granulocytes: 2 %
Lymphocytes Relative: 8 %
Lymphs Abs: 0.2 10*3/uL — ABNORMAL LOW (ref 0.7–4.0)
MCH: 26.2 pg (ref 26.0–34.0)
MCHC: 31.2 g/dL (ref 30.0–36.0)
MCV: 84 fL (ref 80.0–100.0)
Monocytes Absolute: 0.6 10*3/uL (ref 0.1–1.0)
Monocytes Relative: 19 %
Neutro Abs: 2 10*3/uL (ref 1.7–7.7)
Neutrophils Relative %: 70 %
Platelets: 316 10*3/uL (ref 150–400)
RBC: 3.32 MIL/uL — ABNORMAL LOW (ref 3.87–5.11)
RDW: 22.3 % — ABNORMAL HIGH (ref 11.5–15.5)
Smear Review: NORMAL
WBC: 2.9 10*3/uL — ABNORMAL LOW (ref 4.0–10.5)
nRBC: 0 % (ref 0.0–0.2)

## 2020-05-11 LAB — SARS CORONAVIRUS 2 (TAT 6-24 HRS): SARS Coronavirus 2: NEGATIVE

## 2020-05-11 LAB — BRAIN NATRIURETIC PEPTIDE: B Natriuretic Peptide: 19.4 pg/mL (ref 0.0–100.0)

## 2020-05-11 LAB — SARS CORONAVIRUS 2 BY RT PCR (HOSPITAL ORDER, PERFORMED IN ~~LOC~~ HOSPITAL LAB): SARS Coronavirus 2: NEGATIVE

## 2020-05-11 LAB — TROPONIN I (HIGH SENSITIVITY)
Troponin I (High Sensitivity): 7 ng/L (ref <18)
Troponin I (High Sensitivity): 7 ng/L (ref <18)

## 2020-05-11 MED ORDER — IOHEXOL 350 MG/ML SOLN
100.0000 mL | Freq: Once | INTRAVENOUS | Status: AC | PRN
  Administered 2020-05-11: 100 mL via INTRAVENOUS

## 2020-05-11 MED ORDER — SODIUM CHLORIDE 0.9 % IV SOLN
2.0000 g | Freq: Three times a day (TID) | INTRAVENOUS | Status: DC
  Administered 2020-05-12 (×3): 2 g via INTRAVENOUS
  Filled 2020-05-11 (×4): qty 2

## 2020-05-11 MED ORDER — VANCOMYCIN HCL 1250 MG/250ML IV SOLN
1250.0000 mg | INTRAVENOUS | Status: DC
  Administered 2020-05-12: 1250 mg via INTRAVENOUS
  Filled 2020-05-11: qty 250

## 2020-05-11 MED ORDER — APIXABAN 5 MG PO TABS
5.0000 mg | ORAL_TABLET | Freq: Two times a day (BID) | ORAL | Status: DC
  Administered 2020-05-11 – 2020-05-13 (×4): 5 mg via ORAL
  Filled 2020-05-11: qty 2
  Filled 2020-05-11 (×2): qty 1
  Filled 2020-05-11: qty 2

## 2020-05-11 MED ORDER — SODIUM CHLORIDE 0.9 % IV BOLUS
500.0000 mL | Freq: Once | INTRAVENOUS | Status: AC
  Administered 2020-05-11: 500 mL via INTRAVENOUS

## 2020-05-11 MED ORDER — VANCOMYCIN HCL IN DEXTROSE 1-5 GM/200ML-% IV SOLN
1000.0000 mg | Freq: Once | INTRAVENOUS | Status: AC
  Administered 2020-05-11: 1000 mg via INTRAVENOUS
  Filled 2020-05-11: qty 200

## 2020-05-11 MED ORDER — POTASSIUM CHLORIDE CRYS ER 20 MEQ PO TBCR
40.0000 meq | EXTENDED_RELEASE_TABLET | Freq: Once | ORAL | Status: AC
  Administered 2020-05-11: 40 meq via ORAL
  Filled 2020-05-11: qty 2

## 2020-05-11 MED ORDER — SODIUM CHLORIDE 0.9% FLUSH
10.0000 mL | Freq: Once | INTRAVENOUS | Status: AC
  Administered 2020-05-11: 10 mL
  Filled 2020-05-11: qty 10

## 2020-05-11 MED ORDER — SODIUM CHLORIDE 0.9 % IV SOLN
2.0000 g | Freq: Once | INTRAVENOUS | Status: AC
  Administered 2020-05-11: 2 g via INTRAVENOUS
  Filled 2020-05-11: qty 2

## 2020-05-11 NOTE — ED Notes (Signed)
Pt walked around the room with her pulse ox.  Pt sitting was:  Pulse: 101 and Oxygen: 100%. When pt first stood up pulse remained the same but oxygen rapidly fell down to 90% Pt stated "I feel a little lightheaded". Pt sat down. Pt stood back up after her lightheadedness passed. Pt walking ws: Pulse: 131 and Oxygen: 90% RN notified.

## 2020-05-11 NOTE — ED Provider Notes (Signed)
[facility] EMERGENCY DEPARTMENT Provider Note   CSN: [id number] Arrival date & time: 05/11/20  1513     History Chief Complaint  Patient presents with  . Shortness of Breath    Michelle Cain is a 55 y.o. female.  Michelle Cain is a 55 y.o. female with a history of gastric cancer, lupus, renal hospital who presents to the emergency department with from oncology clinic for evaluation of shortness of breath.  Patient reports she has been having shortness of breath over the past 2 to 3 days.  She reports shortness of breath is worse with exertion and improves with rest.  She has had an associated nonproductive cough.  She denies any associated fevers.  No nasal congestion or sore throat.  No lightheadedness or syncope.  Denies any associated abdominal pain, nausea, vomiting or diarrhea.  Denies any known sick contacts.  She had lab work and a send out Covid test completed at her oncologist's office, but she was found to be tachycardic at 128 there and they sent her here for PE study and further evaluation of her shortness of breath.  She is followed by Dr. Earlie Server with oncology for her gastric cancer, completed 2 weeks of radiation last week and is currently on chemotherapy.        Past Medical History:  Diagnosis Date  . Breast CA ([place]) dx'd 2007   left  . Lupus ([place])   . Raynaud disease   . stomach/colon ca dx'd 2020    Patient Active Problem List   Diagnosis Date Noted  . CAP (community acquired pneumonia) 05/11/2020  . Port-A-Cath in place 10/22/2019  . Genetic testing 09/19/2019  . Malignant neoplasm of stomach ([place]) 08/26/2019  . Goals of care, counseling/discussion 08/26/2019  . Plantar fasciitis 07/31/2012  . Right knee pain 05/28/2012    Past Surgical History:  Procedure Laterality Date  . BREAST SURGERY    . CHOLECYSTECTOMY       OB History   No obstetric history on file.     Family History  Problem Relation Age of Onset  . Sudden death  Neg Hx   . Hypertension Neg Hx   . Hyperlipidemia Neg Hx   . Heart attack Neg Hx   . Diabetes Neg Hx     Social History   Tobacco Use  . Smoking status: Never Smoker  . Smokeless tobacco: Never Used  Substance Use Topics  . Alcohol use: No  . Drug use: No    Home Medications Prior to Admission medications   Medication Sig Start Date End Date Taking? Authorizing Provider  amLODipine (NORVASC) 5 MG tablet Take 5 mg by mouth daily.    [provider]  apixaban (ELIQUIS) 5 MG TABS tablet Take 5 mg by mouth in the morning and at bedtime. 05/16/18   [provider]  azaTHIOprine (IMURAN) 50 MG tablet Take 50 mg by mouth 2 (two) times daily.    [provider]  Cholecalciferol 50 MCG (2000 UT) CAPS Take 2,000 Units by mouth daily.    [provider]  cyclobenzaprine (FLEXERIL) 10 MG tablet Take 1 tablet by mouth 3 (three) times daily as needed. 10/27/19   [provider]  docusate sodium (COLACE) 100 MG capsule Take 100 mg by mouth at bedtime.    [provider]  furosemide (LASIX) 20 MG tablet Take 20 mg by mouth daily as needed for fluid or edema.  Patient not taking: No sig reported 11/10/14   [provider]  gabapentin (NEURONTIN) 300 MG capsule Take 300 mg by mouth in the morning and at bedtime. 05/29/18   [provider]  HYDROcodone-acetaminophen (NORCO) 10-325 MG tablet Take 1 tablet by mouth every 6 (six) hours as needed for moderate pain or severe pain.  10/22/17   [provider]  hydroxychloroquine (PLAQUENIL) 200 MG tablet Take 200 mg by mouth 2 (two) times daily.    [provider]  loperamide (IMODIUM) 2 MG capsule Take 2 mg by mouth as needed for diarrhea or loose stools.  Patient not taking: No sig reported 10/23/17   [provider]  LORazepam (ATIVAN) 0.5 MG tablet Place 1 tablet (0.5 mg total) under the tongue every 6 (six) hours as needed (nausea). 12/23/19   Ladell Pier, MD   magic mouthwash SOLN Take 5-10 mLs by mouth 4 (four) times daily as needed for mouth pain. Swish and spit Patient not taking: No sig reported 09/24/19   Alla Feeling, NP  meloxicam (MOBIC) 15 MG tablet Take 1 tablet (15 mg total) by mouth daily. 08/08/12   Hudnall, Sharyn Lull, MD  metoCLOPramide (REGLAN) 10 MG tablet Take 1 tablet (10 mg total) by mouth 3 (three) times daily before meals. 04/27/20   Owens Shark, NP  omeprazole (PRILOSEC) 20 MG capsule Take 20 mg by mouth daily.    [provider]  ondansetron (ZOFRAN) 8 MG tablet Take 1 tablet (8 mg total) by mouth every 8 (eight) hours as needed for nausea or vomiting. 03/09/20   Ladell Pier, MD  Polyethylene Glycol 3350 (MIRALAX PO) Take 17 g by mouth in the morning and at bedtime.    [provider]  potassium chloride (KLOR-CON) 10 MEQ tablet Take 10 mEq by mouth daily. 11/10/14   [provider]  prochlorperazine (COMPAZINE) 10 MG tablet Take 10 mg by mouth every 6 (six) hours as needed for nausea or vomiting.  10/23/17   [provider]  traMADol (ULTRAM) 50 MG tablet Take 1 tablet (50 mg total) by mouth every 8 (eight) hours as needed for pain. 07/30/12   Dene Gentry, MD    Allergies    Patient has no known allergies.  Review of Systems   Review of Systems  Constitutional: Negative for chills and fever.  HENT: Negative.   Respiratory: Positive for cough and shortness of breath.   Cardiovascular: Negative for chest pain and leg swelling.  Gastrointestinal: Negative for abdominal pain, diarrhea, nausea and vomiting.  Genitourinary: Negative for dysuria and frequency.  Musculoskeletal: Negative for arthralgias and myalgias.  Skin: Negative for color change and rash.  Neurological: Negative for dizziness, syncope and light-headedness.  All other systems reviewed and are negative.   Physical Exam Updated Vital Signs BP (!) 108/57   Pulse 73   Temp 98.3 F (36.8 C) (Oral)   Resp 14   Ht  5\' 4"  (1.626 m)   Wt 82.1 kg   SpO2 100%   BMI 31.07 kg/m   Physical Exam Vitals and nursing note reviewed.  Constitutional:      General: She is not in acute distress.    Appearance: She is well-developed, normal weight and well-nourished. She is not diaphoretic.     Comments: Alert, chronically ill-appearing but in no acute distress  HENT:     Head: Normocephalic and atraumatic.     Mouth/Throat:     Mouth: Oropharynx is clear and moist. Mucous membranes are moist.     Pharynx:  Oropharynx is clear.  Eyes:     General:        Right eye: No discharge.        Left eye: No discharge.     Extraocular Movements: EOM normal.     Pupils: Pupils are equal, round, and reactive to light.  Cardiovascular:     Rate and Rhythm: Regular rhythm. Tachycardia present.     Pulses: Normal pulses and intact distal pulses.     Heart sounds: Normal heart sounds. No murmur heard. No friction rub. No gallop.      Comments: Tachycardia in the 110s with regular rhythm. Pulmonary:     Effort: Pulmonary effort is normal. No respiratory distress.     Breath sounds: Decreased breath sounds present. No wheezing, rhonchi or rales.     Comments: At rest patient with normal respiratory effort, satting at 100%, when patient ambulated she quickly dropped down to 90 with increased work of breathing.  On auscultation she has some slightly decreased air movement bilaterally, worse on the left but no focal wheezes, rales or rhonchi. Chest:     Chest wall: No tenderness.  Abdominal:     General: Bowel sounds are normal. There is no distension.     Palpations: Abdomen is soft. There is no mass.     Tenderness: There is no abdominal tenderness. There is no guarding.     Comments: Abdomen soft, nondistended, nontender to palpation in all quadrants without guarding or peritoneal signs   Musculoskeletal:        General: No deformity or edema.     Cervical back: Neck supple.     Right lower leg: No tenderness. No  edema.     Left lower leg: No tenderness. No edema.  Skin:    General: Skin is warm and dry.     Capillary Refill: Capillary refill takes less than 2 seconds.  Neurological:     Mental Status: She is alert and oriented to person, place, and time.     Coordination: Coordination normal.     Comments: Speech is clear, able to follow commands Moves extremities without ataxia, coordination intact  Psychiatric:        Mood and Affect: Mood normal.        Behavior: Behavior normal.     ED Results / Procedures / Treatments   Labs (all labs ordered are listed, but only abnormal results are displayed) Labs Reviewed  COMPREHENSIVE METABOLIC PANEL - Abnormal; Notable for the following components:      Result Value   Sodium 133 (*)    Potassium 3.1 (*)    Calcium 7.3 (*)    Total Protein 5.7 (*)    Albumin 2.5 (*)    Total Bilirubin <0.1 (*)    All other components within normal limits  CBC WITH DIFFERENTIAL/PLATELET - Abnormal; Notable for the following components:   WBC 2.9 (*)    RBC 3.32 (*)    Hemoglobin 8.7 (*)    HCT 27.9 (*)    RDW 22.3 (*)    Lymphs Abs 0.2 (*)    All other components within normal limits  SARS CORONAVIRUS 2 BY RT PCR (HOSPITAL ORDER, [facility])  CULTURE, BLOOD (ROUTINE X 2)  CULTURE, BLOOD (ROUTINE X 2)  MRSA PCR SCREENING  BRAIN NATRIURETIC PEPTIDE  CBC WITH DIFFERENTIAL/PLATELET  TROPONIN I (HIGH SENSITIVITY)  TROPONIN I (HIGH SENSITIVITY)    EKG EKG Interpretation  Date/Time:  Tuesday May 11 2020  22:00:55 EST Ventricular Rate:  99 PR Interval:    QRS Duration: 84 QT Interval:  369 QTC Calculation: 474 R Axis:   17 Text Interpretation: Sinus rhythm Baseline wander in lead(s) V1 No significant change was found Confirmed by Ezequiel Essex [phone]) on 05/11/2020 10:02:50 PM   Radiology CT Angio Chest PE W and/or Wo Contrast  Result Date: 05/11/2020 CLINICAL DATA:  Concern for pulmonary embolism. History of  gastric cancer. EXAM: CT ANGIOGRAPHY CHEST WITH CONTRAST TECHNIQUE: Multidetector CT imaging of the chest was performed using the standard protocol during bolus administration of intravenous contrast. Multiplanar CT image reconstructions and MIPs were obtained to evaluate the vascular anatomy. CONTRAST:  146mL OMNIPAQUE IOHEXOL 350 MG/ML SOLN COMPARISON:  Chest CT dated 03/26/2020 FINDINGS: Cardiovascular: There is no cardiomegaly or pericardial effusion. The thoracic aorta is unremarkable. No pulmonary artery embolus identified. Mediastinum/Nodes: No hilar adenopathy. Diffuse circumferential thickening of the distal esophagus as seen on the prior CT and in keeping with history of infiltrative malignancy. No mediastinal fluid collection. Postsurgical changes of left thyroidectomy. A left-sided Port-A-Cath is noted with tip in the right atrium. Lungs/Pleura: Left upper and left lower lobe streaky and ground-glass densities most consistent with pneumonia, possibly viral or atypical in etiology. Aspiration is not excluded. Clinical correlation is recommended. No lobar consolidation. There is no pleural effusion pneumothorax. The central airways are patent. Upper Abdomen: Cholecystectomy. Thickened appearance of the gastric wall. Periadrenal stranding noted. Musculoskeletal: No chest wall abnormality. No acute or significant osseous findings. Review of the MIP images confirms the above findings. IMPRESSION: 1. No CT evidence of pulmonary artery embolus. 2. Left upper and left lower lobe streaky and ground-glass densities most consistent with pneumonia, possibly viral or atypical in etiology. Aspiration is not excluded. Clinical correlation is recommended. 3. Diffuse circumferential thickening of the distal esophagus as seen on the prior CT and in keeping with history of infiltrative malignancy. 4. Thickened appearance of the gastric wall. Electronically Signed   By: Anner Crete M.D.   On: 05/11/2020 16:22     Procedures Procedures   Medications Ordered in ED Medications  vancomycin (VANCOCIN) IVPB 1000 mg/200 mL premix (1,000 mg Intravenous New Bag/Given 05/11/20 2007)    And  vancomycin (VANCOCIN) IVPB 1000 mg/200 mL premix (has no administration in time range)  vancomycin (VANCOREADY) IVPB 1250 mg/250 mL (has no administration in time range)  potassium chloride SA (KLOR-CON) CR tablet 40 mEq (has no administration in time range)  iohexol (OMNIPAQUE) 350 MG/ML injection 100 mL (100 mLs Intravenous Contrast Given 05/11/20 1554)  ceFEPIme (MAXIPIME) 2 g in sodium chloride 0.9 % 100 mL IVPB (0 g Intravenous Stopped 05/11/20 2000)  sodium chloride 0.9 % bolus 500 mL (500 mLs Intravenous New Bag/Given 05/11/20 1913)    ED Course  I have reviewed the triage vital signs and the nursing notes.  Pertinent labs & imaging results that were available during my care of the patient were reviewed by me and considered in my medical decision making (see chart for details).    MDM Rules/Calculators/A&P                          55 year old female sent from oncology clinic for evaluation of shortness of breath and tachycardia, symptoms present worsening for the past 2 to 3 days.  Shortness of breath worse on exertion, associated with nonproductive cough, no fevers.  No known sick contacts.  Patient has not been vaccinated for Covid.  Is  currently on chemotherapy and immunocompromise.  No associated chest pain.  No abdominal pain, nausea or vomiting.  On arrival patient i chronically ill-appearing but not in acute distress, tachycardic in the 110s with regular rhythm, at rest she has normal O2 sats and work of breathing, but with ambulation patient quickly drops her sats to 90% and becomes tachycardic into the 130s with increased work of breathing which is concerning.  Sent from cancer center for PE study which was ordered from triage.  PE study with no evidence of pulmonary embolism.  In the left upper and lower  lobes streaky and groundglass densities noted most consistent with pneumonia which could be viral or atypical in etiology.  Aspiration is not excluded but patient denies history of choking, does not have difficulty with aphasia and has not had recent vomiting.  There is diffuse circumferential thickening in the distal esophagus as seen on prior CT consistent with history of infiltrative malignancy.  Thickening of the gastric wall as well.  CTA more consistent with pneumonia, no signs of PE thankfully.  Will check additional cardiac labs as well as basic labs and blood cultures.  Patient had Covid swab done here in the emergency department which is negative.  Given patient's immunocompromise state of more concern for bacterial pneumonia.  Lab work shows mild neutropenia with WBC count of 2.9 with absolute neutrophil count of 2.0, slightly improved from prior lab work.  Patient also with mild hyponatremia of 133, potassium of 3.1, p.o. potassium replacement and small fluid bolus given, normal renal function.  Calcium of 7.3 likely in the setting of malignancy, normal LFTs. Troponin is not elevated at 7.  No elevation in BNP to suggest any underlying pulmonary edema.  Blood cultures are pending.  Patient currently does not meet SIRS criteria, but is immunocompromise and therefore at increased risk for severe infection.  I am concerned given that her O2 sats quickly dropped to 90% with activity that she is at high risk for clinical worsening.  Will start on broad-spectrum antibiotics with IV vancomycin and cefepime and plan for hospital admission.  Patient is in agreement with this plan.  Case discussed with Dr. Roel Cluck with [facility] hospitalist who accepts patient for transfer and admission.  Final Clinical Impression(s) / ED Diagnoses Final diagnoses:  Pneumonia of left lung due to infectious organism, unspecified part of lung  Neutropenia, unspecified type [facility])    Rx / DC Orders ED Discharge Orders     None       Janet Berlin 05/11/20 2342    Ezequiel Essex, MD 05/11/20 2348

## 2020-05-11 NOTE — ED Notes (Signed)
Pt. Vomited after K+ given approx. 130mls .. green liquid  At 2132

## 2020-05-11 NOTE — Progress Notes (Signed)
[facility] OFFICE PROGRESS NOTE   Diagnosis: Gastric cancer  INTERVAL HISTORY:   [name] returns as scheduled.  She completed another cycle of Taxol/ramucirumab 04/27/2020.  Denies nausea/vomiting.  No diarrhea.  Appetite is poor.  She reports adequate fluid intake.  No bleeding.  She became markedly short of breath on exertion 2 days ago.  No chest pain.  No fever.  She has had a cough which seems to be improving.  She denies nasal congestion.  No sore throat.  No leg swelling or calf pain.   Objective:  Vital signs in last 24 hours:  Blood pressure 115/67, pulse (!) 128, temperature 97.7 F (36.5 C), temperature source Tympanic, resp. rate 16, height _0  (1.626 m), weight 181 lb 4.8 oz (82.2 kg), SpO2 99 %.   She appears comfortable at rest, with no distress.  With exertion she becomes markedly dyspneic.  Per nurse report with minimal ambulation patient had to remove her mask, pursed lip breathing, stated she could not continue walking due to dyspnea. HEENT: No thrush or ulcers. Resp: Lungs clear bilaterally.  No respiratory distress. Cardio: Tachycardia.  No JVD. GI: Abdomen soft and nontender.  No hepatomegaly. Vascular: No leg edema. Port-A-Cath without erythema.  Lab Results:  Lab Results  Component Value Date   WBC 3.4 (L) 05/11/2020   HGB 8.6 (L) 05/11/2020   HCT 27.8 (L) 05/11/2020   MCV 84.5 05/11/2020   PLT 352 05/11/2020   NEUTROABS 2.7 05/11/2020    Imaging:  No results found.  Medications: I have reviewed the patient's current medications.  Assessment/Plan: 1. Gastric cancer ? Presenting with dysphagia spring 2019 ? 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 CTs revealed wall thickening and luminal narrowing of the colon at the hepatic flexure and cecum ? PET scan with no evidence of distant metastatic disease or abnormal uptake other than thickening of the distal esophagus and proximal stomach ? Colonoscopy 10/05/2017 at [facility] revealed multiple foci of thickening/masses at the cecum, hepatic flexure, and distal rectum, biopsy from the cecum and hepatic flexure revealed metastatic adenocarcinoma of gastric origin, 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, PD-L1 combined positive score 2 on hepatic flexure biopsy ? CTs 10/23/2017-diffuse prominence of the gastric wall, especially the antrum, focal wall thickening of the distal ascending colon and hepatic flexure, thickening of the cecum, nonspecific haziness of the mesenteric fat in the pelvis, mild prominence of lymph nodes at the greater curvature of the stomach ? Treatment with FOLFOX beginning July 2019 ? CTs October 2019-stable disease, FOLFOX continued ? CTs December 2019-stable disease ? January 2020 treatment transition to Xeloda maintenance, care transition to [facility] ? CTs in June 2020 in September 2020-stable disease, Xeloda continued ? CTs 06/23/2019-nonspecific thickening of the GE junction, intraluminal bladder mass ? Cystoscopy-metastatic signet ring cell adenocarcinoma ? Cycle 1 day 1 ramucirumab/paclitaxel 08/19/2019(given atanother facility) ? Cycle 1 day 1 ramucirumab/paclitaxel 09/04/2019 ? Cycle 1 day 8 Taxol 09/10/19 ? Cycle 1 day 15 ramucirumab/paclitaxel 6/2/2021canceled d/t neutropenia ? Ramucirumab/pactlitaxelevery 2 weeks 09/24/2019 ? Ramucirumab/paclitaxel 10/08/2019 ? Ramucirumab/paclitaxel 10/22/2019 ? Ramucirumab/paclitaxel 11/05/2019 ? CTs 11/24/2019-unchanged thickening of the distal esophagus/upper stomach, gastric body and antrum. Unchanged thickening of  the transverse colon at  the hepatic flexure, unchanged thickening of the urinary bladder, no evidence of progressive disease ? Ramucirumab/paclitaxel 11/25/2019 ? Ramucirumab/paclitaxel 12/09/2019 ? Ramucirumab/paclitaxel 12/23/2019 ? Ramucirumab/paclitaxel9/21/2021 ? Ramucirumab/paclitaxel 01/20/2020 ? Ramucirumab/paclitaxel 02/04/2020 ? Ramucirumab/paclitaxel 02/17/2020 ? Ramucirumab/paclitaxel 03/02/2020 ? Ramucirumab/paclitaxel 03/16/2020 ? CTs 03/28/2020-stable thickening of the distal esophagus, GE junction, gastric body, and antrum. Decreased soft tissue thickening of the transverse colon at the hepatic flexure, no evidence of metastatic disease to the chest ? Ramucirumab/paclitaxel 03/30/2020 ? SBRT to the stomach starting 04/12/2020 ? Ramucirumab/paclitaxel 04/05/2020 ? Ramucirumab/paclitaxel 04/13/2020 ? Ramucirumab/paclitaxel 04/27/2020  2.Left breast cancer 2008,pT1c,pN0, status post a left lumpectomy with adjuvant chemotherapy and radiation, ER positive, PR positive, HER-2 positive  3.Mixed connective tissue disease/SLE  4.Lower extremity deep vein thrombosis maintained on apixaban  5.Family history of multiple cancers including breast and ovarian cancer  6.Dysphagia and intermittent vomiting secondary to #1  7.Hypertension  8.Peripheral neuropathy  9.Masslike fullness at the posterior right parotid/angle of the jaw  10. Dyspnea on exertion, ongoing for about a month, worsened after taxol/ramucirumab on 09/04/19 requiring ED visit on 5/23. CBC, CMP, troponin, BNP and chest xray negative  11.Anemia likely secondary to combination of chemotherapy and blood loss  2 units packed red blood cells 04/12/2020   Disposition: [name] has gastric cancer.  She is on active treatment with Taxol/ramucirumab.  She presents today for routine follow-up prior to treatment.  She reports sudden onset of marked dyspnea on exertion 2 days ago, recent  cough, no fever.  She is tachycardic at rest.  Hemoglobin is stable over the past 3 weeks.  We initially discussed evaluation in the emergency department.  However, given the wait time in the ED we are referring her for a stat chest CT to rule out PE, other etiology of dyspnea.  Covid test will also be obtained.  She will be seen back in the office after the scan by Sandi Mealy, PA with further plans at that time.  I discussed the above with Dr. Julien Nordmann.    Ned Card ANP/GNP-BC   05/11/2020  12:27 PM

## 2020-05-11 NOTE — ED Notes (Signed)
[facility] made aware of consult for admit

## 2020-05-11 NOTE — ED Notes (Signed)
Patient denies pain and is resting comfortably.  

## 2020-05-11 NOTE — ED Triage Notes (Signed)
She came from the cancer center with sob. She has had a Covid test with results pending. She has had lab studies prior to arrival to the ER. She needs a PE study. Oxygen sats 100%. No resp distress. Her mother is with her.

## 2020-05-11 NOTE — Progress Notes (Addendum)
Pharmacy Antibiotic Note  Michelle Cain is a 55 y.o. female with gastric cancer admitted on 05/11/2020 from cancer center with SOB.  Pharmacy has been consulted for vancomycin dosing for PNA. Scr at baseline.   Plan: Vancomycin 2000mg  x1 loading dose  Followed by vancomycin 1250mg  q24h (eAUC 480 using Scr 0.8 and Vd 0.5) Monitor renal function  Height: 5\' 4"  (162.6 cm) Weight: 82.1 kg (181 lb) IBW/kg (Calculated) : 54.7  Temp (24hrs), Avg:97.9 F (36.6 C), Min:97.7 F (36.5 C), Max:98.3 F (36.8 C)  Recent Labs  Lab 05/11/20 1130 05/11/20 1644 05/11/20 1830  WBC 3.4*  --  2.9*  CREATININE 0.63 0.55  --     Estimated Creatinine Clearance: 85.3 mL/min (by C-G formula based on SCr of 0.55 mg/dL).    No Known Allergies  Antimicrobials this admission: Vancomycin 1/25 >> Cefepime x1 1/25   Dose adjustments this admission: N/a  Microbiology results: 1/25 bcx:   Thank you for allowing pharmacy to be a part of this patient's care.  Cristela Felt, PharmD Clinical Pharmacist  05/11/2020 7:01 PM   Addendum: One time dose of cefepime ordered in ED. Discussed with Dr. Wyvonnia Dusky and will continue cefepime. Pharmacy to dose.    Plan:  - Start cefepime 2g IV q8h  - Watch renal function

## 2020-05-11 NOTE — ED Notes (Signed)
Pt requesting at this time that no attempt be made at obtaining Second Set of Blood Cultures.

## 2020-05-12 ENCOUNTER — Ambulatory Visit: Payer: Medicare Other

## 2020-05-12 DIAGNOSIS — C169 Malignant neoplasm of stomach, unspecified: Secondary | ICD-10-CM

## 2020-05-12 DIAGNOSIS — E876 Hypokalemia: Secondary | ICD-10-CM

## 2020-05-12 DIAGNOSIS — E871 Hypo-osmolality and hyponatremia: Secondary | ICD-10-CM

## 2020-05-12 DIAGNOSIS — D709 Neutropenia, unspecified: Secondary | ICD-10-CM | POA: Diagnosis present

## 2020-05-12 DIAGNOSIS — Z85038 Personal history of other malignant neoplasm of large intestine: Secondary | ICD-10-CM | POA: Diagnosis not present

## 2020-05-12 DIAGNOSIS — Z20822 Contact with and (suspected) exposure to covid-19: Secondary | ICD-10-CM | POA: Diagnosis present

## 2020-05-12 DIAGNOSIS — Z923 Personal history of irradiation: Secondary | ICD-10-CM | POA: Diagnosis not present

## 2020-05-12 DIAGNOSIS — R0902 Hypoxemia: Secondary | ICD-10-CM | POA: Diagnosis present

## 2020-05-12 DIAGNOSIS — I7300 Raynaud's syndrome without gangrene: Secondary | ICD-10-CM | POA: Diagnosis present

## 2020-05-12 DIAGNOSIS — Z7901 Long term (current) use of anticoagulants: Secondary | ICD-10-CM | POA: Diagnosis not present

## 2020-05-12 DIAGNOSIS — R0602 Shortness of breath: Secondary | ICD-10-CM | POA: Diagnosis present

## 2020-05-12 DIAGNOSIS — Z853 Personal history of malignant neoplasm of breast: Secondary | ICD-10-CM | POA: Diagnosis not present

## 2020-05-12 DIAGNOSIS — M329 Systemic lupus erythematosus, unspecified: Secondary | ICD-10-CM | POA: Diagnosis present

## 2020-05-12 DIAGNOSIS — Z85028 Personal history of other malignant neoplasm of stomach: Secondary | ICD-10-CM | POA: Diagnosis not present

## 2020-05-12 DIAGNOSIS — J189 Pneumonia, unspecified organism: Secondary | ICD-10-CM | POA: Diagnosis present

## 2020-05-12 DIAGNOSIS — E860 Dehydration: Secondary | ICD-10-CM | POA: Diagnosis present

## 2020-05-12 DIAGNOSIS — C155 Malignant neoplasm of lower third of esophagus: Secondary | ICD-10-CM | POA: Diagnosis present

## 2020-05-12 DIAGNOSIS — Z791 Long term (current) use of non-steroidal anti-inflammatories (NSAID): Secondary | ICD-10-CM | POA: Diagnosis not present

## 2020-05-12 DIAGNOSIS — Z79899 Other long term (current) drug therapy: Secondary | ICD-10-CM | POA: Diagnosis not present

## 2020-05-12 LAB — CBC WITH DIFFERENTIAL/PLATELET
Abs Immature Granulocytes: 0.03 10*3/uL (ref 0.00–0.07)
Basophils Absolute: 0 10*3/uL (ref 0.0–0.1)
Basophils Relative: 0 %
Eosinophils Absolute: 0 10*3/uL (ref 0.0–0.5)
Eosinophils Relative: 1 %
HCT: 29.2 % — ABNORMAL LOW (ref 36.0–46.0)
Hemoglobin: 8.5 g/dL — ABNORMAL LOW (ref 12.0–15.0)
Immature Granulocytes: 1 %
Lymphocytes Relative: 10 %
Lymphs Abs: 0.3 10*3/uL — ABNORMAL LOW (ref 0.7–4.0)
MCH: 26 pg (ref 26.0–34.0)
MCHC: 29.1 g/dL — ABNORMAL LOW (ref 30.0–36.0)
MCV: 89.3 fL (ref 80.0–100.0)
Monocytes Absolute: 0.4 10*3/uL (ref 0.1–1.0)
Monocytes Relative: 14 %
Neutro Abs: 2 10*3/uL (ref 1.7–7.7)
Neutrophils Relative %: 74 %
Platelets: 322 10*3/uL (ref 150–400)
RBC: 3.27 MIL/uL — ABNORMAL LOW (ref 3.87–5.11)
RDW: 23.2 % — ABNORMAL HIGH (ref 11.5–15.5)
WBC: 2.8 10*3/uL — ABNORMAL LOW (ref 4.0–10.5)
nRBC: 0.7 % — ABNORMAL HIGH (ref 0.0–0.2)

## 2020-05-12 LAB — CREATININE, SERUM
Creatinine, Ser: 0.5 mg/dL (ref 0.44–1.00)
GFR, Estimated: >60 mL/min (ref >60)

## 2020-05-12 LAB — MRSA PCR SCREENING: MRSA by PCR: NEGATIVE

## 2020-05-12 MED ORDER — HYDROCODONE-ACETAMINOPHEN 10-325 MG PO TABS
1.0000 | ORAL_TABLET | Freq: Four times a day (QID) | ORAL | Status: DC | PRN
Start: 2020-05-12 — End: 2020-05-13
  Filled 2020-05-12: qty 1

## 2020-05-12 MED ORDER — SODIUM CHLORIDE 0.9 % IV SOLN
500.0000 mg | INTRAVENOUS | Status: DC
  Administered 2020-05-12: 500 mg via INTRAVENOUS
  Filled 2020-05-12 (×2): qty 500

## 2020-05-12 MED ORDER — VITAMIN D 25 MCG (1000 UNIT) PO TABS
2000.0000 [IU] | ORAL_TABLET | Freq: Every day | ORAL | Status: DC
  Administered 2020-05-12 – 2020-05-13 (×2): 2000 [IU] via ORAL
  Filled 2020-05-12 (×2): qty 2

## 2020-05-12 MED ORDER — ENOXAPARIN SODIUM 40 MG/0.4ML ~~LOC~~ SOLN: 40.0000 mg | SUBCUTANEOUS | Status: DC

## 2020-05-12 MED ORDER — APIXABAN 2.5 MG PO TABS: 5.0000 mg | ORAL_TABLET | Freq: Two times a day (BID) | ORAL | Status: DC

## 2020-05-12 MED ORDER — PROCHLORPERAZINE MALEATE 10 MG PO TABS: 10.0000 mg | ORAL_TABLET | Freq: Four times a day (QID) | ORAL | Status: DC | PRN

## 2020-05-12 MED ORDER — GABAPENTIN 300 MG PO CAPS
300.0000 mg | ORAL_CAPSULE | Freq: Two times a day (BID) | ORAL | Status: DC
  Administered 2020-05-12 – 2020-05-13 (×2): 300 mg via ORAL
  Filled 2020-05-12 (×2): qty 1

## 2020-05-12 MED ORDER — SODIUM CHLORIDE 0.9 % IV SOLN: INTRAVENOUS | Status: DC

## 2020-05-12 MED ORDER — VANCOMYCIN HCL 500 MG IV SOLR
INTRAVENOUS | Status: AC
  Filled 2020-05-12: qty 500

## 2020-05-12 MED ORDER — AZATHIOPRINE 50 MG PO TABS
50.0000 mg | ORAL_TABLET | Freq: Two times a day (BID) | ORAL | Status: DC
  Administered 2020-05-12 – 2020-05-13 (×2): 50 mg via ORAL
  Filled 2020-05-12 (×5): qty 1

## 2020-05-12 MED ORDER — ONDANSETRON HCL 8 MG PO TABS: 8.0000 mg | ORAL_TABLET | Freq: Three times a day (TID) | ORAL | Status: DC | PRN

## 2020-05-12 MED ORDER — ONDANSETRON 4 MG PO TBDP
8.0000 mg | ORAL_TABLET | Freq: Three times a day (TID) | ORAL | Status: DC | PRN
  Administered 2020-05-12: 8 mg via ORAL
  Filled 2020-05-12: qty 2

## 2020-05-12 MED ORDER — SODIUM CHLORIDE 0.9 % IV SOLN
2.0000 g | INTRAVENOUS | Status: DC
  Administered 2020-05-13: 2 g via INTRAVENOUS
  Filled 2020-05-12: qty 2

## 2020-05-12 MED ORDER — SODIUM CHLORIDE 0.9 % IV SOLN
INTRAVENOUS | Status: DC | PRN
  Administered 2020-05-12: 500 mL via INTRAVENOUS

## 2020-05-12 MED ORDER — VANCOMYCIN HCL 1000 MG IV SOLR
INTRAVENOUS | Status: AC
  Filled 2020-05-12: qty 1000

## 2020-05-12 MED ORDER — HYDROXYCHLOROQUINE SULFATE 200 MG PO TABS
200.0000 mg | ORAL_TABLET | Freq: Two times a day (BID) | ORAL | Status: DC
  Administered 2020-05-12 – 2020-05-13 (×2): 200 mg via ORAL
  Filled 2020-05-12 (×5): qty 1

## 2020-05-12 MED ORDER — AMLODIPINE BESYLATE 5 MG PO TABS
5.0000 mg | ORAL_TABLET | Freq: Every day | ORAL | Status: DC
  Administered 2020-05-12 – 2020-05-13 (×2): 5 mg via ORAL
  Filled 2020-05-12 (×2): qty 1

## 2020-05-12 MED ORDER — VANCOMYCIN HCL 10 G IV SOLR
1250.0000 mg | Freq: Once | INTRAVENOUS | Status: DC
  Filled 2020-05-12: qty 1250

## 2020-05-12 NOTE — H&P (Signed)
History and Physical   Michelle Cain GGY:[id number] DOB: 29-May-1965 DOA: 05/11/2020  Referring MD/NP/PA: Benedetto Goad, PA  PCP: Laurette Schimke, DO   Outpatient Specialists: Betsy Coder, oncology  Patient coming from: [facility]  Chief Complaint: Shortness of breath  HPI: Michelle Cain is a 55 y.o. female with medical history significant of breast cancer, Raynaud's phenomenon, stomach and colon cancer who presented to [facility] with shortness of breath cough and fever. Symptoms have been going on for a few days. Patient was noted to be hypoxic on arrival. Further treatment and management showed lobar pneumonia. Patient was COVID-19 negative. She is however immunocompromise from her previous underlying medical problems. Patient subsequently admitted to the hospital therefore for treatment of community-acquired pneumonia. She has improved since admission yesterday. Denied any fever or chills today. She still has a cough and weakness. Patient has gradually been taken off oxygen this evening..  ED Course: Temperature 99.3 blood pressure 120/65. Her pulse is 102 respirate of 21 oxygen sat 78% on room air. White count is 2.8 hemoglobin 8.5 and platelets 322. Sodium is 133 potassium 3.1. BNP of 19. COVID-19 screen was negative. CT abdomen pelvis showed no evidence of PE. Left upper and left lower lobe groundglass densities consistent with pneumonia. Evidence of infiltrative lower esophageal malignancy. Patient therefore admitted with community-acquired pneumonia. No recent hospitalization in the last 3 months.  Review of Systems: As per HPI otherwise 10 point review of systems negative.    Past Medical History:  Diagnosis Date  . Breast CA ([place]) dx'd 2007   left  . Lupus ([place])   . Raynaud disease   . stomach/colon ca dx'd 2020    Past Surgical History:  Procedure Laterality Date  . BREAST SURGERY    . CHOLECYSTECTOMY       reports that she has never  smoked. She has never used smokeless tobacco. She reports that she does not drink alcohol and does not use drugs.  No Known Allergies  Family History  Problem Relation Age of Onset  . Sudden death Neg Hx   . Hypertension Neg Hx   . Hyperlipidemia Neg Hx   . Heart attack Neg Hx   . Diabetes Neg Hx      Prior to Admission medications   Medication Sig Start Date End Date Taking? Authorizing Provider  apixaban (ELIQUIS) 5 MG TABS tablet Take 5 mg by mouth in the morning and at bedtime. 05/16/18  Yes [provider]  amLODipine (NORVASC) 5 MG tablet Take 5 mg by mouth daily.    [provider]  azaTHIOprine (IMURAN) 50 MG tablet Take 50 mg by mouth 2 (two) times daily.    [provider]  Cholecalciferol 50 MCG (2000 UT) CAPS Take 2,000 Units by mouth daily.    [provider]  cyclobenzaprine (FLEXERIL) 10 MG tablet Take 1 tablet by mouth 3 (three) times daily as needed. 10/27/19   [provider]  gabapentin (NEURONTIN) 300 MG capsule Take 300 mg by mouth in the morning and at bedtime. 05/29/18   [provider]  HYDROcodone-acetaminophen (NORCO) 10-325 MG tablet Take 1 tablet by mouth every 6 (six) hours as needed for moderate pain or severe pain.  10/22/17   [provider]  hydroxychloroquine (PLAQUENIL) 200 MG tablet Take 200 mg by mouth 2 (two) times daily.    [provider]  magic mouthwash SOLN Take 5-10 mLs by mouth 4 (four) times daily as needed for mouth  pain. Swish and spit Patient not taking: No sig reported 09/24/19   Alla Feeling, NP  meloxicam (MOBIC) 15 MG tablet Take 1 tablet (15 mg total) by mouth daily. 08/08/12   Hudnall, Sharyn Lull, MD  metoCLOPramide (REGLAN) 10 MG tablet Take 1 tablet (10 mg total) by mouth 3 (three) times daily before meals. 04/27/20   Owens Shark, NP  ondansetron (ZOFRAN) 8 MG tablet Take 1 tablet (8 mg total) by mouth every 8 (eight) hours as needed for nausea or vomiting. 03/09/20    Ladell Pier, MD  Polyethylene Glycol 3350 (MIRALAX PO) Take 17 g by mouth in the morning and at bedtime.    [provider]  potassium chloride (KLOR-CON) 10 MEQ tablet Take 10 mEq by mouth daily. 11/10/14   [provider]  prochlorperazine (COMPAZINE) 10 MG tablet Take 10 mg by mouth every 6 (six) hours as needed for nausea or vomiting.  10/23/17   [provider]  traMADol (ULTRAM) 50 MG tablet Take 1 tablet (50 mg total) by mouth every 8 (eight) hours as needed for pain. 07/30/12   Dene Gentry, MD    Physical Exam: Vitals:   05/12/20 1830 05/12/20 2005 05/12/20 2104 05/12/20 2128  BP: 115/68 104/80 119/65   Pulse: (!) 102  100   Resp: 20 15 16    Temp:   99.3 F (37.4 C)   TempSrc:   Oral   SpO2: 100% 100% 100%   Weight:    82.4 kg  Height:    5\' 4"  (1.626 m)      Constitutional: Chronically ill looking, no distress Vitals:   05/12/20 1830 05/12/20 2005 05/12/20 2104 05/12/20 2128  BP: 115/68 104/80 119/65   Pulse: (!) 102  100   Resp: 20 15 16    Temp:   99.3 F (37.4 C)   TempSrc:   Oral   SpO2: 100% 100% 100%   Weight:    82.4 kg  Height:    5\' 4"  (1.626 m)   Eyes: PERRL, lids and conjunctivae normal ENMT: Mucous membranes are moist. Posterior pharynx clear of any exudate or lesions.Normal dentition.  Neck: normal, supple, no masses, no thyromegaly Respiratory: Decreased air entry bilaterally with mild basal crackles normal respiratory effort. No accessory muscle use.  Cardiovascular: Sinus tachycardia, no murmurs / rubs / gallops. No extremity edema. 2+ pedal pulses. No carotid bruits.  Abdomen: no tenderness, no masses palpated. No hepatosplenomegaly. Bowel sounds positive.  Musculoskeletal: no clubbing / cyanosis. No joint deformity upper and lower extremities. Good ROM, no contractures. Normal muscle tone.  Skin: no rashes, lesions, ulcers. No induration Neurologic: CN 2-12 grossly intact. Sensation intact, DTR normal. Strength 5/5  in all 4.  Psychiatric: Normal judgment and insight. Alert and oriented x 3. Normal mood.     Labs on Admission: I have personally reviewed following labs and imaging studies  CBC: Recent Labs  Lab 05/11/20 1130 05/11/20 1830 05/12/20 2058  WBC 3.4* 2.9* 2.8*  NEUTROABS 2.7 2.0 2.0  HGB 8.6* 8.7* 8.5*  HCT 27.8* 27.9* 29.2*  MCV 84.5 84.0 89.3  PLT 352 316 [id number]   Basic Metabolic Panel: Recent Labs  Lab 05/11/20 1130 05/11/20 1644  NA 138 133*  K 3.4* 3.1*  CL 102 102  CO2 28 23  GLUCOSE 148* 92  BUN 13 13  CREATININE 0.63 0.55  CALCIUM 8.2* 7.3*   GFR: Estimated Creatinine Clearance: 85.4 mL/min (by C-G formula based on SCr of 0.55 mg/dL).  Liver Function Tests: Recent Labs  Lab 05/11/20 1130 05/11/20 1644  AST 13* 15  ALT 7 8  ALKPHOS 62 50  BILITOT 0.3 <0.1*  PROT 6.2* 5.7*  ALBUMIN 2.5* 2.5*   No results for input(s): LIPASE, AMYLASE in the last 168 hours. No results for input(s): AMMONIA in the last 168 hours. Coagulation Profile: No results for input(s): INR, PROTIME in the last 168 hours. Cardiac Enzymes: No results for input(s): CKTOTAL, CKMB, CKMBINDEX, TROPONINI in the last 168 hours. BNP (last 3 results) No results for input(s): PROBNP in the last 8760 hours. HbA1C: No results for input(s): HGBA1C in the last 72 hours. CBG: No results for input(s): GLUCAP in the last 168 hours. Lipid Profile: No results for input(s): CHOL, HDL, LDLCALC, TRIG, CHOLHDL, LDLDIRECT in the last 72 hours. Thyroid Function Tests: No results for input(s): TSH, T4TOTAL, FREET4, T3FREE, THYROIDAB in the last 72 hours. Anemia Panel: No results for input(s): VITAMINB12, FOLATE, FERRITIN, TIBC, IRON, RETICCTPCT in the last 72 hours. Urine analysis:    Component Value Date/Time   COLORURINE AMBER (A) 10/22/2019 1000   APPEARANCEUR CLOUDY (A) 10/22/2019 1000   LABSPEC 1.024 10/22/2019 1000   PHURINE 5.0 10/22/2019 1000   GLUCOSEU NEGATIVE 10/22/2019 1000   HGBUR  LARGE (A) 10/22/2019 1000   BILIRUBINUR NEGATIVE 10/22/2019 1000   KETONESUR NEGATIVE 10/22/2019 1000   PROTEINUR 100 (A) 03/02/2020 0950   NITRITE NEGATIVE 10/22/2019 1000   LEUKOCYTESUR SMALL (A) 10/22/2019 1000   Sepsis Labs: @LABRCNTIP (procalcitonin:4,lacticidven:4) ) Recent Results (from the past 240 hour(s))  SARS Coronavirus 2 (TAT 6-24 hrs)     Status: None   Collection Time: 05/11/20  2:12 PM   Specimen: Nasopharyngeal Swab  Result Value Ref Range Status   SARS Coronavirus 2 NEGATIVE NEGATIVE Final    Comment: (NOTE) SARS-CoV-2 target nucleic acids are NOT DETECTED.  The SARS-CoV-2 RNA is generally detectable in upper and lower respiratory specimens during the acute phase of infection. Negative results do not preclude SARS-CoV-2 infection, do not rule out co-infections with other pathogens, and should not be used as the sole basis for treatment or other patient management decisions. Negative results must be combined with clinical observations, patient history, and epidemiological information. The expected result is Negative.  Fact Sheet for Patients: SugarRoll.be  Fact Sheet for Healthcare Providers: https://www.woods-mathews.com/  This test is not yet approved or cleared by the [place] FDA and  has been authorized for detection and/or diagnosis of SARS-CoV-2 by FDA under an Emergency Use Authorization (EUA). This EUA will remain  in effect (meaning this test can be used) for the duration of the COVID-19 declaration under Se ction 564(b)(1) of the Act, 21 U.S.C. section 360bbb-3(b)(1), unless the authorization is terminated or revoked sooner.  Performed at [facility], [place] [address] [place], [place] [address]   SARS Coronavirus 2 by RT PCR (hospital order, performed in [facility] hospital lab) Nasopharyngeal Nasopharyngeal Swab     Status: None   Collection Time: 05/11/20  4:44 PM   Specimen:  Nasopharyngeal Swab  Result Value Ref Range Status   SARS Coronavirus 2 NEGATIVE NEGATIVE Final    Comment: (NOTE) SARS-CoV-2 target nucleic acids are NOT DETECTED.  The SARS-CoV-2 RNA is generally detectable in upper and lower respiratory specimens during the acute phase of infection. The lowest concentration of SARS-CoV-2 viral copies this assay can detect is 250 copies / mL. A negative result does not preclude SARS-CoV-2 infection and should not be used as the  sole basis for treatment or other patient management decisions.  A negative result may occur with improper specimen collection / handling, submission of specimen other than nasopharyngeal swab, presence of viral mutation(s) within the areas targeted by this assay, and inadequate number of viral copies (<250 copies / mL). A negative result must be combined with clinical observations, patient history, and epidemiological information.  Fact Sheet for Patients:   StrictlyIdeas.no  Fact Sheet for Healthcare Providers: BankingDealers.co.za  This test is not yet approved or  cleared by the [place] FDA and has been authorized for detection and/or diagnosis of SARS-CoV-2 by FDA under an Emergency Use Authorization (EUA).  This EUA will remain in effect (meaning this test can be used) for the duration of the COVID-19 declaration under Section 564(b)(1) of the Act, 21 U.S.C. section 360bbb-3(b)(1), unless the authorization is terminated or revoked sooner.  Performed at [facility], [address] [place], [place] [address]   Culture, blood (routine x 2)     Status: None (Preliminary result)   Collection Time: 05/11/20  6:36 PM   Specimen: Porta Cath; Blood  Result Value Ref Range Status   Specimen Description   Final    PORTA CATH CHEST UPPER Performed at [facility], [address] [place], [place] [address]    Special Requests   Final    BOTTLES  DRAWN AEROBIC AND ANAEROBIC Blood Culture adequate volume Performed at [facility], [address] [place], [place] [address]    Culture   Final    NO GROWTH < 12 HOURS Performed at [facility], [place] [address] [place], [place] [address]    Report Status PENDING  Incomplete  MRSA PCR Screening     Status: None   Collection Time: 05/11/20  9:10 PM   Specimen: Nasal Mucosa; Nasopharyngeal  Result Value Ref Range Status   MRSA by PCR NEGATIVE NEGATIVE Final    Comment:        The GeneXpert MRSA Assay (FDA approved for NASAL specimens only), is one component of a comprehensive MRSA colonization surveillance program. It is not intended to diagnose MRSA infection nor to guide or monitor treatment for MRSA infections. Performed at [facility], [place] [address] [place], [place] [address]      Radiological Exams on Admission: CT Angio Chest PE W and/or Wo Contrast  Result Date: 05/11/2020 CLINICAL DATA:  Concern for pulmonary embolism. History of gastric cancer. EXAM: CT ANGIOGRAPHY CHEST WITH CONTRAST TECHNIQUE: Multidetector CT imaging of the chest was performed using the standard protocol during bolus administration of intravenous contrast. Multiplanar CT image reconstructions and MIPs were obtained to evaluate the vascular anatomy. CONTRAST:  158mL OMNIPAQUE IOHEXOL 350 MG/ML SOLN COMPARISON:  Chest CT dated 03/26/2020 FINDINGS: Cardiovascular: There is no cardiomegaly or pericardial effusion. The thoracic aorta is unremarkable. No pulmonary artery embolus identified. Mediastinum/Nodes: No hilar adenopathy. Diffuse circumferential thickening of the distal esophagus as seen on the prior CT and in keeping with history of infiltrative malignancy. No mediastinal fluid collection. Postsurgical changes of left thyroidectomy. A left-sided Port-A-Cath is noted with tip in the right atrium. Lungs/Pleura: Left upper and left lower lobe streaky and ground-glass densities most  consistent with pneumonia, possibly viral or atypical in etiology. Aspiration is not excluded. Clinical correlation is recommended. No lobar consolidation. There is no pleural effusion pneumothorax. The central airways are patent. Upper Abdomen: Cholecystectomy. Thickened appearance of the gastric wall. Periadrenal stranding  noted. Musculoskeletal: No chest wall abnormality. No acute or significant osseous findings. Review of the MIP images confirms the above findings. IMPRESSION: 1. No CT evidence of pulmonary artery embolus. 2. Left upper and left lower lobe streaky and ground-glass densities most consistent with pneumonia, possibly viral or atypical in etiology. Aspiration is not excluded. Clinical correlation is recommended. 3. Diffuse circumferential thickening of the distal esophagus as seen on the prior CT and in keeping with history of infiltrative malignancy. 4. Thickened appearance of the gastric wall. Electronically Signed   By: Anner Crete M.D.   On: 05/11/2020 16:22      Assessment/Plan Principal Problem:   CAP (community acquired pneumonia) Active Problems:   Malignant neoplasm of stomach (HCC)   Hypokalemia   Hyponatremia     #1 community-acquired pneumonia: Patient will be admitted and initiated on IV Rocephin and Zithromax. Oxygen and breathing treatment as needed. Continue supportive care.  #2 breast and stomach cancer: Follow-up outpatient with oncology.  #3 hyponatremia: Most likely dehydration. Hydrate and monitor sodium level.  #4 hypokalemia: Replete potassium   DVT prophylaxis: Lovenox Code Status: Full code Family Communication: No family at bedside Disposition Plan: Home Consults called: None Admission status: Inpatient  Severity of Illness: The appropriate patient status for this patient is INPATIENT. Inpatient status is judged to be reasonable and necessary in order to provide the required intensity of service to ensure the patient's safety. The  patient's presenting symptoms, physical exam findings, and initial radiographic and laboratory data in the context of their chronic comorbidities is felt to place them at high risk for further clinical deterioration. Furthermore, it is not anticipated that the patient will be medically stable for discharge from the hospital within 2 midnights of admission. The following factors support the patient status of inpatient.   " The patient's presenting symptoms include shortness of breath and cough. " The worrisome physical exam findings include coarse breath sound bilaterally. " The initial radiographic and laboratory data are worrisome because of left lung pneumonia. " The chronic co-morbidities include history of stomach cancer.   * I certify that at the point of admission it is my clinical judgment that the patient will require inpatient hospital care spanning beyond 2 midnights from the point of admission due to high intensity of service, high risk for further deterioration and high frequency of surveillance required.Barbette Merino MD [facility] Pager [phone]  If 7PM-7AM, please contact night-coverage [email or web address] Password [facility]  05/12/2020, 9:42 PM

## 2020-05-13 DIAGNOSIS — J189 Pneumonia, unspecified organism: Principal | ICD-10-CM

## 2020-05-13 LAB — CBC WITH DIFFERENTIAL/PLATELET
Abs Immature Granulocytes: 0.02 10*3/uL (ref 0.00–0.07)
Basophils Absolute: 0 10*3/uL (ref 0.0–0.1)
Basophils Relative: 0 %
Eosinophils Absolute: 0 10*3/uL (ref 0.0–0.5)
Eosinophils Relative: 0 %
HCT: 25.6 % — ABNORMAL LOW (ref 36.0–46.0)
Hemoglobin: 7.5 g/dL — ABNORMAL LOW (ref 12.0–15.0)
Immature Granulocytes: 1 %
Lymphocytes Relative: 9 %
Lymphs Abs: 0.3 10*3/uL — ABNORMAL LOW (ref 0.7–4.0)
MCH: 26.4 pg (ref 26.0–34.0)
MCHC: 29.3 g/dL — ABNORMAL LOW (ref 30.0–36.0)
MCV: 90.1 fL (ref 80.0–100.0)
Monocytes Absolute: 0.5 10*3/uL (ref 0.1–1.0)
Monocytes Relative: 17 %
Neutro Abs: 2 10*3/uL (ref 1.7–7.7)
Neutrophils Relative %: 73 %
Platelets: 304 10*3/uL (ref 150–400)
RBC: 2.84 MIL/uL — ABNORMAL LOW (ref 3.87–5.11)
RDW: 23.1 % — ABNORMAL HIGH (ref 11.5–15.5)
WBC: 2.8 10*3/uL — ABNORMAL LOW (ref 4.0–10.5)
nRBC: 0 % (ref 0.0–0.2)

## 2020-05-13 LAB — COMPREHENSIVE METABOLIC PANEL
ALT: 9 U/L (ref 0–44)
AST: 16 U/L (ref 15–41)
Albumin: 2.4 g/dL — ABNORMAL LOW (ref 3.5–5.0)
Alkaline Phosphatase: 48 U/L (ref 38–126)
Anion gap: 7 (ref 5–15)
BUN: 9 mg/dL (ref 6–20)
CO2: 23 mmol/L (ref 22–32)
Calcium: 7.8 mg/dL — ABNORMAL LOW (ref 8.9–10.3)
Chloride: 106 mmol/L (ref 98–111)
Creatinine, Ser: 0.56 mg/dL (ref 0.44–1.00)
GFR, Estimated: >60 mL/min (ref >60)
Glucose, Bld: 103 mg/dL — ABNORMAL HIGH (ref 70–99)
Potassium: 3.7 mmol/L (ref 3.5–5.1)
Sodium: 136 mmol/L (ref 135–145)
Total Bilirubin: 0.5 mg/dL (ref 0.3–1.2)
Total Protein: 5.4 g/dL — ABNORMAL LOW (ref 6.5–8.1)

## 2020-05-13 LAB — STREP PNEUMONIAE URINARY ANTIGEN: Strep Pneumo Urinary Antigen: NEGATIVE

## 2020-05-13 LAB — HIV ANTIBODY (ROUTINE TESTING W REFLEX): HIV Screen 4th Generation wRfx: NONREACTIVE

## 2020-05-13 MED ORDER — CHLORHEXIDINE GLUCONATE CLOTH 2 % EX PADS: 6.0000 | MEDICATED_PAD | Freq: Every day | CUTANEOUS | Status: DC

## 2020-05-13 MED ORDER — AMOXICILLIN-POT CLAVULANATE 875-125 MG PO TABS: 1.0000 | ORAL_TABLET | Freq: Two times a day (BID) | ORAL | 0 refills | Status: AC

## 2020-05-13 MED ORDER — HEPARIN SOD (PORK) LOCK FLUSH 100 UNIT/ML IV SOLN
500.0000 [IU] | Freq: Once | INTRAVENOUS | Status: AC
  Administered 2020-05-13: 500 [IU] via INTRAVENOUS
  Filled 2020-05-13: qty 5

## 2020-05-13 NOTE — Discharge Summary (Signed)
Physician Discharge Summary  Michelle Cain [id number] DOB: 14-May-1965 DOA: 05/11/2020  PCP: Laurette Schimke, DO  Admit date: 05/11/2020 Discharge date: 05/13/2020  Admitted From: Home Disposition: Home Recommendations for Outpatient Follow-up:  1. Follow up with PCP in 1-2 weeks 2. Please obtain BMP/CBC in one week 3. Please follow up with oncology  Home Health: None Equipment/Devices none  Discharge Condition: Stable CODE STATUS: Full code Diet recommendation: Cardiac Brief/Interim Summary:Michelle Cain is a 55 y.o. female with medical history significant of breast cancer, Raynaud's phenomenon, stomach and colon cancer who presented to [facility] with shortness of breath cough and fever. Symptoms have been going on for a few days. Patient was noted to be hypoxic on arrival. Further treatment and management showed lobar pneumonia. Patient was COVID-19 negative. She is however immunocompromise from her previous underlying medical problems. Patient subsequently admitted to the hospital therefore for treatment of community-acquired pneumonia. She has improved since admission yesterday. Denied any fever or chills today. She still has a cough and weakness. Patient has gradually been taken off oxygen this evening..  ED Course: Temperature 99.3 blood pressure 120/65. Her pulse is 102 respirate of 21 oxygen sat 78% on room air. White count is 2.8 hemoglobin 8.5 and platelets 322. Sodium is 133 potassium 3.1. BNP of 19. COVID-19 screen was negative. CT abdomen pelvis showed no evidence of PE. Left upper and left lower lobe groundglass densities consistent with pneumonia. Evidence of infiltrative lower esophageal malignancy. Patient therefore admitted with community-acquired pneumonia. No recent hospitalization in the last 3 months.  Discharge Diagnoses:  Principal Problem:   CAP (community acquired pneumonia) Active Problems:   Malignant neoplasm of stomach (HCC)    Hypokalemia   Hyponatremia   #1 community-acquired pneumonia in a patient with history of breast cancer and stomach cancer-patient was resting comfortably on the day of discharge on room air.  She was afebrile and her vital signs remained normal with normal. She received Rocephin and azithromycin.  She will be discharged on Augmentin for 5 days.  She will follow up with her oncologist.  Estimated body mass index is 31.18 kg/m as calculated from the following:   Height as of this encounter: 5\' 4"  (1.626 m).   Weight as of this encounter: 82.4 kg.  Discharge Instructions  Discharge Instructions    Call MD for:  difficulty breathing, headache or visual disturbances   Complete by: As directed    Diet - low sodium heart healthy   Complete by: As directed    Increase activity slowly   Complete by: As directed      Allergies as of 05/13/2020   No Known Allergies     Medication List    TAKE these medications   amLODipine 5 MG tablet Commonly known as: NORVASC Take 5 mg by mouth daily.   amoxicillin-clavulanate 875-125 MG tablet Commonly known as: Augmentin Take 1 tablet by mouth 2 (two) times daily for 5 days.   apixaban 5 MG Tabs tablet Commonly known as: ELIQUIS Take 5 mg by mouth in the morning and at bedtime.   azaTHIOprine 50 MG tablet Commonly known as: IMURAN Take 50 mg by mouth 2 (two) times daily.   Cholecalciferol 50 MCG (2000 UT) Caps Take 2,000 Units by mouth daily.   cyclobenzaprine 10 MG tablet Commonly known as: FLEXERIL Take 1 tablet by mouth 3 (three) times daily as needed for muscle spasms.   gabapentin 300 MG capsule Commonly known as: NEURONTIN Take 300 mg by mouth  in the morning and at bedtime.   HYDROcodone-acetaminophen 10-325 MG tablet Commonly known as: NORCO Take 1 tablet by mouth every 6 (six) hours as needed for moderate pain or severe pain.   hydroxychloroquine 200 MG tablet Commonly known as: PLAQUENIL Take 200 mg by mouth 2 (two)  times daily.   meloxicam 15 MG tablet Commonly known as: MOBIC Take 1 tablet (15 mg total) by mouth daily.   metoCLOPramide 10 MG tablet Commonly known as: REGLAN Take 1 tablet (10 mg total) by mouth 3 (three) times daily before meals.   MIRALAX PO Take 17 g by mouth in the morning and at bedtime.   ondansetron 8 MG tablet Commonly known as: ZOFRAN Take 1 tablet (8 mg total) by mouth every 8 (eight) hours as needed for nausea or vomiting.   potassium chloride 10 MEQ tablet Commonly known as: KLOR-CON Take 10 mEq by mouth daily.   prochlorperazine 10 MG tablet Commonly known as: COMPAZINE Take 10 mg by mouth every 6 (six) hours as needed for nausea or vomiting.   traMADol 50 MG tablet Commonly known as: ULTRAM Take 1 tablet (50 mg total) by mouth every 8 (eight) hours as needed for pain.       Follow-up Information    Bennie Pierini A, DO Follow up.   Specialty: Internal Medicine Contact information: [place] [address] [phone]              No Known Allergies  Consultations:  None   Procedures/Studies: CT Angio Chest PE W and/or Wo Contrast  Result Date: 05/11/2020 CLINICAL DATA:  Concern for pulmonary embolism. History of gastric cancer. EXAM: CT ANGIOGRAPHY CHEST WITH CONTRAST TECHNIQUE: Multidetector CT imaging of the chest was performed using the standard protocol during bolus administration of intravenous contrast. Multiplanar CT image reconstructions and MIPs were obtained to evaluate the vascular anatomy. CONTRAST:  199mL OMNIPAQUE IOHEXOL 350 MG/ML SOLN COMPARISON:  Chest CT dated 03/26/2020 FINDINGS: Cardiovascular: There is no cardiomegaly or pericardial effusion. The thoracic aorta is unremarkable. No pulmonary artery embolus identified. Mediastinum/Nodes: No hilar adenopathy. Diffuse circumferential thickening of the distal esophagus as seen on the prior CT and in keeping with history of infiltrative malignancy. No mediastinal  fluid collection. Postsurgical changes of left thyroidectomy. A left-sided Port-A-Cath is noted with tip in the right atrium. Lungs/Pleura: Left upper and left lower lobe streaky and ground-glass densities most consistent with pneumonia, possibly viral or atypical in etiology. Aspiration is not excluded. Clinical correlation is recommended. No lobar consolidation. There is no pleural effusion pneumothorax. The central airways are patent. Upper Abdomen: Cholecystectomy. Thickened appearance of the gastric wall. Periadrenal stranding noted. Musculoskeletal: No chest wall abnormality. No acute or significant osseous findings. Review of the MIP images confirms the above findings. IMPRESSION: 1. No CT evidence of pulmonary artery embolus. 2. Left upper and left lower lobe streaky and ground-glass densities most consistent with pneumonia, possibly viral or atypical in etiology. Aspiration is not excluded. Clinical correlation is recommended. 3. Diffuse circumferential thickening of the distal esophagus as seen on the prior CT and in keeping with history of infiltrative malignancy. 4. Thickened appearance of the gastric wall. Electronically Signed   By: Anner Crete M.D.   On: 05/11/2020 16:22    (Echo, Carotid, EGD, Colonoscopy, ERCP)    Subjective:  She is resting in bed denies any new complaints Breathing has improved denies any cough  Discharge Exam: Vitals:   05/13/20 0458 05/13/20 1002  BP: 110/77 122/82  Pulse: (!) 102 95  Resp: 16 16  Temp: 98.3 F (36.8 C) 98.6 F (37 C)  SpO2: 97% 99%   Vitals:   05/12/20 2104 05/12/20 2128 05/13/20 0458 05/13/20 1002  BP: 119/65  110/77 122/82  Pulse: 100  (!) 102 95  Resp: 16  16 16   Temp: 99.3 F (37.4 C)  98.3 F (36.8 C) 98.6 F (37 C)  TempSrc: Oral  Oral Oral  SpO2: 100%  97% 99%  Weight:  82.4 kg    Height:  5\' 4"  (1.626 m)      General: Pt is alert, awake, not in acute distress Cardiovascular: RRR, S1/S2 +, no rubs, no  gallops Respiratory few rhonchi bilaterally, no wheezing, no rhonchi Abdominal: Soft, NT, ND, bowel sounds + Extremities: no edema, no cyanosis    The results of significant diagnostics from this hospitalization (including imaging, microbiology, ancillary and laboratory) are listed below for reference.     Microbiology: Recent Results (from the past 240 hour(s))  SARS Coronavirus 2 (TAT 6-24 hrs)     Status: None   Collection Time: 05/11/20  2:12 PM   Specimen: Nasopharyngeal Swab  Result Value Ref Range Status   SARS Coronavirus 2 NEGATIVE NEGATIVE Final    Comment: (NOTE) SARS-CoV-2 target nucleic acids are NOT DETECTED.  The SARS-CoV-2 RNA is generally detectable in upper and lower respiratory specimens during the acute phase of infection. Negative results do not preclude SARS-CoV-2 infection, do not rule out co-infections with other pathogens, and should not be used as the sole basis for treatment or other patient management decisions. Negative results must be combined with clinical observations, patient history, and epidemiological information. The expected result is Negative.  Fact Sheet for Patients: SugarRoll.be  Fact Sheet for Healthcare Providers: https://www.woods-.com/  This test is not yet approved or cleared by the [place] FDA and  has been authorized for detection and/or diagnosis of SARS-CoV-2 by FDA under an Emergency Use Authorization (EUA). This EUA will remain  in effect (meaning this test can be used) for the duration of the COVID-19 declaration under Se ction 564(b)(1) of the Act, 21 U.S.C. section 360bbb-3(b)(1), unless the authorization is terminated or revoked sooner.  Performed at [facility], [place] [address] [place], [place] [address]   SARS Coronavirus 2 by RT PCR (hospital order, performed in [facility] hospital lab) Nasopharyngeal Nasopharyngeal Swab     Status: None   Collection  Time: 05/11/20  4:44 PM   Specimen: Nasopharyngeal Swab  Result Value Ref Range Status   SARS Coronavirus 2 NEGATIVE NEGATIVE Final    Comment: (NOTE) SARS-CoV-2 target nucleic acids are NOT DETECTED.  The SARS-CoV-2 RNA is generally detectable in upper and lower respiratory specimens during the acute phase of infection. The lowest concentration of SARS-CoV-2 viral copies this assay can detect is 250 copies / mL. A negative result does not preclude SARS-CoV-2 infection and should not be used as the sole basis for treatment or other patient management decisions.  A negative result may occur with improper specimen collection / handling, submission of specimen other than nasopharyngeal swab, presence of viral mutation(s) within the areas targeted by this assay, and inadequate number of viral copies (<250 copies / mL). A negative result must be combined with clinical observations, patient history, and epidemiological information.  Fact Sheet for Patients:   StrictlyIdeas.no  Fact Sheet for Healthcare Providers: BankingDealers.co.za  This test is not yet approved or  cleared by the [place] FDA and  has been authorized for detection and/or diagnosis of SARS-CoV-2 by FDA under an Emergency Use Authorization (EUA).  This EUA will remain in effect (meaning this test can be used) for the duration of the COVID-19 declaration under Section 564(b)(1) of the Act, 21 U.S.C. section 360bbb-3(b)(1), unless the authorization is terminated or revoked sooner.  Performed at [facility], [address] [place], [place] [address]   Culture, blood (routine x 2)     Status: None (Preliminary result)   Collection Time: 05/11/20  6:36 PM   Specimen: Porta Cath; Blood  Result Value Ref Range Status   Specimen Description   Final    PORTA CATH CHEST UPPER Performed at [facility], [address] [place], [place] [address]     Special Requests   Final    BOTTLES DRAWN AEROBIC AND ANAEROBIC Blood Culture adequate volume Performed at [facility], [address] [place], [place] [address]    Culture   Final    NO GROWTH 2 DAYS Performed at [facility], [place] [address] [place], [place] [address]    Report Status PENDING  Incomplete  MRSA PCR Screening     Status: None   Collection Time: 05/11/20  9:10 PM   Specimen: Nasal Mucosa; Nasopharyngeal  Result Value Ref Range Status   MRSA by PCR NEGATIVE NEGATIVE Final    Comment:        The GeneXpert MRSA Assay (FDA approved for NASAL specimens only), is one component of a comprehensive MRSA colonization surveillance program. It is not intended to diagnose MRSA infection nor to guide or monitor treatment for MRSA infections. Performed at [facility], [place] [address], [place], [place] [address]      Labs: BNP (last 3 results) Recent Labs    09/07/19 2120 05/11/20 1644  BNP 16.6 [id number]   Basic Metabolic Panel: Recent Labs  Lab 05/11/20 1130 05/11/20 1644 05/12/20 2141 05/13/20 0603  NA 138 133*  --  136  K 3.4* 3.1*  --  3.7  CL 102 102  --  106  CO2 28 23  --  23  GLUCOSE 148* 92  --  103*  BUN 13 13  --  9  CREATININE 0.63 0.55 0.50 0.56  CALCIUM 8.2* 7.3*  --  7.8*   Liver Function Tests: Recent Labs  Lab 05/11/20 1130 05/11/20 1644 05/13/20 0603  AST 13* 15 16  ALT 7 8 9   ALKPHOS 62 50 48  BILITOT 0.3 <0.1* 0.5  PROT 6.2* 5.7* 5.4*  ALBUMIN 2.5* 2.5* 2.4*   No results for input(s): LIPASE, AMYLASE in the last 168 hours. No results for input(s): AMMONIA in the last 168 hours. CBC: Recent Labs  Lab 05/11/20 1130 05/11/20 1830 05/12/20 2058 05/13/20 0603  WBC 3.4* 2.9* 2.8* 2.8*  NEUTROABS 2.7 2.0 2.0 2.0  HGB 8.6* 8.7* 8.5* 7.5*  HCT 27.8* 27.9* 29.2* 25.6*  MCV 84.5 84.0 89.3 90.1  PLT 352 316 322 304   Cardiac Enzymes: No results for input(s): CKTOTAL, CKMB, CKMBINDEX, TROPONINI in the last  168 hours. BNP: Invalid input(s): POCBNP CBG: No results for input(s): GLUCAP in the last 168 hours. D-Dimer No results for input(s): DDIMER in the last 72 hours. Hgb A1c No results for input(s): HGBA1C in the last 72 hours. Lipid Profile No results for input(s): CHOL, HDL, LDLCALC, TRIG, CHOLHDL, LDLDIRECT in the last 72 hours. Thyroid function studies No results for  input(s): TSH, T4TOTAL, T3FREE, THYROIDAB in the last 72 hours.  Invalid input(s): FREET3 Anemia work up No results for input(s): VITAMINB12, FOLATE, FERRITIN, TIBC, IRON, RETICCTPCT in the last 72 hours. Urinalysis    Component Value Date/Time   COLORURINE AMBER (A) 10/22/2019 1000   APPEARANCEUR CLOUDY (A) 10/22/2019 1000   LABSPEC 1.024 10/22/2019 1000   PHURINE 5.0 10/22/2019 1000   GLUCOSEU NEGATIVE 10/22/2019 1000   HGBUR LARGE (A) 10/22/2019 1000   BILIRUBINUR NEGATIVE 10/22/2019 1000   KETONESUR NEGATIVE 10/22/2019 1000   PROTEINUR 100 (A) 03/02/2020 0950   NITRITE NEGATIVE 10/22/2019 1000   LEUKOCYTESUR SMALL (A) 10/22/2019 1000   Sepsis Labs Invalid input(s): PROCALCITONIN,  WBC,  LACTICIDVEN Microbiology Recent Results (from the past 240 hour(s))  SARS Coronavirus 2 (TAT 6-24 hrs)     Status: None   Collection Time: 05/11/20  2:12 PM   Specimen: Nasopharyngeal Swab  Result Value Ref Range Status   SARS Coronavirus 2 NEGATIVE NEGATIVE Final    Comment: (NOTE) SARS-CoV-2 target nucleic acids are NOT DETECTED.  The SARS-CoV-2 RNA is generally detectable in upper and lower respiratory specimens during the acute phase of infection. Negative results do not preclude SARS-CoV-2 infection, do not rule out co-infections with other pathogens, and should not be used as the sole basis for treatment or other patient management decisions. Negative results must be combined with clinical observations, patient history, and epidemiological information. The expected result is Negative.  Fact Sheet for  Patients: SugarRoll.be  Fact Sheet for Healthcare Providers: https://www.woods-.com/  This test is not yet approved or cleared by the [place] FDA and  has been authorized for detection and/or diagnosis of SARS-CoV-2 by FDA under an Emergency Use Authorization (EUA). This EUA will remain  in effect (meaning this test can be used) for the duration of the COVID-19 declaration under Se ction 564(b)(1) of the Act, 21 U.S.C. section 360bbb-3(b)(1), unless the authorization is terminated or revoked sooner.  Performed at [facility], [place] [address] [place], [place] [address]   SARS Coronavirus 2 by RT PCR (hospital order, performed in [facility] hospital lab) Nasopharyngeal Nasopharyngeal Swab     Status: None   Collection Time: 05/11/20  4:44 PM   Specimen: Nasopharyngeal Swab  Result Value Ref Range Status   SARS Coronavirus 2 NEGATIVE NEGATIVE Final    Comment: (NOTE) SARS-CoV-2 target nucleic acids are NOT DETECTED.  The SARS-CoV-2 RNA is generally detectable in upper and lower respiratory specimens during the acute phase of infection. The lowest concentration of SARS-CoV-2 viral copies this assay can detect is 250 copies / mL. A negative result does not preclude SARS-CoV-2 infection and should not be used as the sole basis for treatment or other patient management decisions.  A negative result may occur with improper specimen collection / handling, submission of specimen other than nasopharyngeal swab, presence of viral mutation(s) within the areas targeted by this assay, and inadequate number of viral copies (<250 copies / mL). A negative result must be combined with clinical observations, patient history, and epidemiological information.  Fact Sheet for Patients:   StrictlyIdeas.no  Fact Sheet for Healthcare Providers: BankingDealers.co.za  This test is not yet  approved or  cleared by the [place] FDA and has been authorized for detection and/or diagnosis of SARS-CoV-2 by FDA under an Emergency Use Authorization (EUA).  This EUA will remain in effect (meaning this test can be used) for the duration of the COVID-19 declaration under Section 564(b)(1) of the  Act, 21 U.S.C. section 360bbb-3(b)(1), unless the authorization is terminated or revoked sooner.  Performed at [facility], [address] [place], [place] [address]   Culture, blood (routine x 2)     Status: None (Preliminary result)   Collection Time: 05/11/20  6:36 PM   Specimen: Porta Cath; Blood  Result Value Ref Range Status   Specimen Description   Final    PORTA CATH CHEST UPPER Performed at [facility], [address] [place], [place] [address]    Special Requests   Final    BOTTLES DRAWN AEROBIC AND ANAEROBIC Blood Culture adequate volume Performed at [facility], [address] [place], [place] [address]    Culture   Final    NO GROWTH 2 DAYS Performed at [facility], [place] [address], [place], [place] [address]    Report Status PENDING  Incomplete  MRSA PCR Screening     Status: None   Collection Time: 05/11/20  9:10 PM   Specimen: Nasal Mucosa; Nasopharyngeal  Result Value Ref Range Status   MRSA by PCR NEGATIVE NEGATIVE Final    Comment:        The GeneXpert MRSA Assay (FDA approved for NASAL specimens only), is one component of a comprehensive MRSA colonization surveillance program. It is not intended to diagnose MRSA infection nor to guide or monitor treatment for MRSA infections. Performed at [facility], [place] [address], [place], [place] [address]      Time coordinating discharge:  38 minutes  SIGNED:   Georgette Shell, MD  [facility] 05/13/2020, 2:28 PM

## 2020-05-13 NOTE — Progress Notes (Signed)
No blood return with port. Reaccessed by Roselyn Reef, RN. Still no blood return. IV team consult ordered.

## 2020-05-13 NOTE — Progress Notes (Signed)
Patient discharged to home with family. Discharge instructions reviewed with patient who verbalized understanding.  ?

## 2020-05-13 NOTE — Plan of Care (Signed)
  Problem: Safety: Goal: Ability to remain free from injury will improve Description: Patient will be free from injury by 05/17/20 Outcome: Progressing

## 2020-05-13 NOTE — Progress Notes (Signed)
  Radiation Oncology         [phone] ________________________________  Name: Michelle Cain MRN: [id number]  Date: 05/06/2020  DOB: 25-Jun-1965  End of Treatment Note  Diagnosis:   stage IV gastric cancer      Indication for treatment::  palliative       Radiation treatment dates:   04/12/20 - 05/06/20  Site/dose:   The stomach and surrounding region was treated to a dose of 37.5 Gray in 15 fractions at 2.5 Pearline Cables per fraction.  This was accomplished using a 5 field 3D conformal technique.  Narrative: The patient tolerated radiation treatment relatively well.     Plan: The patient has completed radiation treatment. The patient will return to radiation oncology clinic for routine followup in one month. I advised the patient to call or return sooner if they have any questions or concerns related to their recovery or treatment. ________________________________  Jodelle Gross, M.D., Ph.D.

## 2020-05-14 ENCOUNTER — Ambulatory Visit: Payer: Medicare Other

## 2020-05-16 LAB — CULTURE, BLOOD (ROUTINE X 2)
Culture: NO GROWTH
Special Requests: ADEQUATE

## 2020-05-17 ENCOUNTER — Ambulatory Visit: Payer: Medicare Other

## 2020-05-17 NOTE — Progress Notes (Signed)
Patient was seen for Covid testing prior to a hospital admission.  Sandi Mealy, MHS, PA-C Physician Assistant

## 2020-05-18 LAB — CULTURE, BLOOD (ROUTINE X 2)
Culture: NO GROWTH
Special Requests: ADEQUATE

## 2020-05-26 ENCOUNTER — Inpatient Hospital Stay: Payer: Medicare Other

## 2020-05-26 ENCOUNTER — Other Ambulatory Visit: Payer: Self-pay

## 2020-05-26 ENCOUNTER — Inpatient Hospital Stay (HOSPITAL_BASED_OUTPATIENT_CLINIC_OR_DEPARTMENT_OTHER): Payer: Medicare Other | Admitting: Oncology

## 2020-05-26 ENCOUNTER — Inpatient Hospital Stay: Payer: Medicare Other | Attending: Oncology

## 2020-05-26 VITALS — BP 119/80 | HR 120 | Temp 97.8°F | Resp 18 | Ht 64.0 in | Wt 178.0 lb

## 2020-05-26 DIAGNOSIS — Z5111 Encounter for antineoplastic chemotherapy: Secondary | ICD-10-CM | POA: Diagnosis present

## 2020-05-26 DIAGNOSIS — D649 Anemia, unspecified: Secondary | ICD-10-CM | POA: Insufficient documentation

## 2020-05-26 DIAGNOSIS — Z5112 Encounter for antineoplastic immunotherapy: Secondary | ICD-10-CM | POA: Insufficient documentation

## 2020-05-26 DIAGNOSIS — C169 Malignant neoplasm of stomach, unspecified: Secondary | ICD-10-CM

## 2020-05-26 DIAGNOSIS — D6481 Anemia due to antineoplastic chemotherapy: Secondary | ICD-10-CM | POA: Insufficient documentation

## 2020-05-26 DIAGNOSIS — G629 Polyneuropathy, unspecified: Secondary | ICD-10-CM | POA: Insufficient documentation

## 2020-05-26 DIAGNOSIS — I10 Essential (primary) hypertension: Secondary | ICD-10-CM | POA: Insufficient documentation

## 2020-05-26 LAB — CBC WITH DIFFERENTIAL (CANCER CENTER ONLY)
Abs Immature Granulocytes: 0.01 10*3/uL (ref 0.00–0.07)
Basophils Absolute: 0 10*3/uL (ref 0.0–0.1)
Basophils Relative: 0 %
Eosinophils Absolute: 0 10*3/uL (ref 0.0–0.5)
Eosinophils Relative: 0 %
HCT: 28.9 % — ABNORMAL LOW (ref 36.0–46.0)
Hemoglobin: 8.7 g/dL — ABNORMAL LOW (ref 12.0–15.0)
Immature Granulocytes: 0 %
Lymphocytes Relative: 43 %
Lymphs Abs: 2.2 10*3/uL (ref 0.7–4.0)
MCH: 25.5 pg — ABNORMAL LOW (ref 26.0–34.0)
MCHC: 30.1 g/dL (ref 30.0–36.0)
MCV: 84.8 fL (ref 80.0–100.0)
Monocytes Absolute: 0.5 10*3/uL (ref 0.1–1.0)
Monocytes Relative: 10 %
Neutro Abs: 2.3 10*3/uL (ref 1.7–7.7)
Neutrophils Relative %: 47 %
Platelet Count: 255 10*3/uL (ref 150–400)
RBC: 3.41 MIL/uL — ABNORMAL LOW (ref 3.87–5.11)
RDW: 22.4 % — ABNORMAL HIGH (ref 11.5–15.5)
WBC Count: 5 10*3/uL (ref 4.0–10.5)
nRBC: 0 % (ref 0.0–0.2)

## 2020-05-26 LAB — CMP (CANCER CENTER ONLY)
ALT: 9 U/L (ref 0–44)
AST: 18 U/L (ref 15–41)
Albumin: 2.8 g/dL — ABNORMAL LOW (ref 3.5–5.0)
Alkaline Phosphatase: 67 U/L (ref 38–126)
Anion gap: 7 (ref 5–15)
BUN: 11 mg/dL (ref 6–20)
CO2: 24 mmol/L (ref 22–32)
Calcium: 8.4 mg/dL — ABNORMAL LOW (ref 8.9–10.3)
Chloride: 105 mmol/L (ref 98–111)
Creatinine: 0.72 mg/dL (ref 0.44–1.00)
GFR, Estimated: >60 mL/min (ref >60)
Glucose, Bld: 154 mg/dL — ABNORMAL HIGH (ref 70–99)
Potassium: 3.6 mmol/L (ref 3.5–5.1)
Sodium: 136 mmol/L (ref 135–145)
Total Bilirubin: 0.2 mg/dL — ABNORMAL LOW (ref 0.3–1.2)
Total Protein: 6.5 g/dL (ref 6.5–8.1)

## 2020-05-26 MED ORDER — HEPARIN SOD (PORK) LOCK FLUSH 100 UNIT/ML IV SOLN
500.0000 [IU] | Freq: Once | INTRAVENOUS | Status: AC | PRN
  Administered 2020-05-26: 500 [IU]
  Filled 2020-05-26: qty 5

## 2020-05-26 MED ORDER — RAMUCIRUMAB CHEMO INJECTION 500 MG/50ML
8.0000 mg/kg | Freq: Once | INTRAVENOUS | Status: AC
  Administered 2020-05-26: 700 mg via INTRAVENOUS
  Filled 2020-05-26: qty 50

## 2020-05-26 MED ORDER — DIPHENHYDRAMINE HCL 50 MG/ML IJ SOLN
25.0000 mg | Freq: Once | INTRAMUSCULAR | Status: AC
  Administered 2020-05-26: 25 mg via INTRAVENOUS

## 2020-05-26 MED ORDER — ONDANSETRON HCL 8 MG PO TABS: 8.0000 mg | ORAL_TABLET | Freq: Three times a day (TID) | ORAL | 1 refills | Status: DC | PRN

## 2020-05-26 MED ORDER — DIPHENHYDRAMINE HCL 50 MG/ML IJ SOLN
INTRAMUSCULAR | Status: AC
  Filled 2020-05-26: qty 1

## 2020-05-26 MED ORDER — FAMOTIDINE IN NACL 20-0.9 MG/50ML-% IV SOLN
20.0000 mg | Freq: Once | INTRAVENOUS | Status: AC
  Administered 2020-05-26: 20 mg via INTRAVENOUS

## 2020-05-26 MED ORDER — SODIUM CHLORIDE 0.9 % IV SOLN
60.0000 mg/m2 | Freq: Once | INTRAVENOUS | Status: AC
  Administered 2020-05-26: 114 mg via INTRAVENOUS
  Filled 2020-05-26: qty 19

## 2020-05-26 MED ORDER — DEXAMETHASONE SODIUM PHOSPHATE 100 MG/10ML IJ SOLN
10.0000 mg | Freq: Once | INTRAMUSCULAR | Status: AC
  Administered 2020-05-26: 10 mg via INTRAVENOUS
  Filled 2020-05-26: qty 1
  Filled 2020-05-26: qty 10

## 2020-05-26 MED ORDER — SODIUM CHLORIDE 0.9% FLUSH
10.0000 mL | INTRAVENOUS | Status: DC | PRN
  Administered 2020-05-26: 10 mL
  Filled 2020-05-26: qty 10

## 2020-05-26 MED ORDER — SODIUM CHLORIDE 0.9 % IV SOLN
Freq: Once | INTRAVENOUS | Status: AC
  Filled 2020-05-26: qty 250

## 2020-05-26 MED ORDER — FAMOTIDINE IN NACL 20-0.9 MG/50ML-% IV SOLN
INTRAVENOUS | Status: AC
  Filled 2020-05-26: qty 50

## 2020-05-26 NOTE — Progress Notes (Signed)
Per Dr. Stevphen Meuse to treat w/pulse 120

## 2020-05-26 NOTE — Progress Notes (Signed)
[facility] OFFICE PROGRESS NOTE   Diagnosis: Gastric cancer  INTERVAL HISTORY:   [name] returns as scheduled.  She presented with a cough and dyspnea on 05/11/2020.  A CT chest revealed evidence of pneumonia.  There was diffuse thickening of the distal esophagus.  She was admitted and treated for community-acquired pneumonia.  COVID-19 testing was negative.  She was discharged on 05/13/2020 and completed an outpatient course of Augmentin.  She reports feeling better, though she continues to have exertional dyspnea.  She vomits in the evening, but in general the nausea/vomiting has improved following the course of palliative radiation.  Stable neuropathy symptoms.  Hematuria has diminished.  Objective:  Vital signs in last 24 hours:  Blood pressure 119/80, pulse (!) 124, temperature 97.8 F (36.6 C), temperature source Tympanic, resp. rate 18, height 5' 4" (1.626 m), weight 178 lb (80.7 kg), SpO2 100 %.    HEENT: No thrush or ulcers Resp: Lungs clear bilaterally, decreased breath sounds at the bases, no respiratory distress Cardio: Regular rate and rhythm, tachycardia GI: No hepatosplenomegaly, no mass, nontender Vascular: No leg edema    Portacath/PICC-without erythema  Lab Results:  Lab Results  Component Value Date   WBC 5.0 05/26/2020   HGB 8.7 (L) 05/26/2020   HCT 28.9 (L) 05/26/2020   MCV 84.8 05/26/2020   PLT 255 05/26/2020   NEUTROABS 2.3 05/26/2020    CMP  Lab Results  Component Value Date   NA 136 05/26/2020   K 3.6 05/26/2020   CL 105 05/26/2020   CO2 24 05/26/2020   GLUCOSE 154 (H) 05/26/2020   BUN 11 05/26/2020   CREATININE 0.72 05/26/2020   CALCIUM 8.4 (L) 05/26/2020   PROT 6.5 05/26/2020   ALBUMIN 2.8 (L) 05/26/2020   AST 18 05/26/2020   ALT 9 05/26/2020   ALKPHOS 67 05/26/2020   BILITOT 0.2 (L) 05/26/2020   GFRNONAA >60 05/26/2020   GFRAA >60 01/06/2020    Lab Results  Component Value Date   CEA1 3.45 09/04/2019     Medications: I have reviewed the patient's current medications.   Assessment/Plan: 1. Gastric cancer ? Presenting with dysphagia spring 2019 ? 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 CTs revealed wall thickening and luminal narrowing of the colon at the hepatic flexure and cecum ? PET scan with no evidence of distant metastatic disease or abnormal uptake other than thickening of the distal esophagus and proximal stomach ? Colonoscopy 10/05/2017 at [facility] revealed multiple foci of thickening/masses at the cecum, hepatic flexure, and distal rectum, biopsy from the cecum and hepatic flexure revealed metastatic adenocarcinoma of gastric origin, 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, PD-L1 combined positive score 2 on hepatic flexure biopsy ? CTs 10/23/2017-diffuse prominence of the gastric wall, especially the antrum, focal wall thickening of the distal ascending colon and hepatic flexure, thickening of the cecum, nonspecific haziness of the mesenteric fat in the pelvis, mild prominence of lymph nodes at the greater curvature of the stomach ? Treatment with FOLFOX beginning July 2019 ? CTs October 2019-stable disease, FOLFOX continued ? CTs December 2019-stable disease ? January 2020 treatment transition to Xeloda maintenance, care transition to [place] ? CTs in June 2020 in September 2020-stable  disease, Xeloda continued ? CTs 06/23/2019-nonspecific thickening of the GE junction, intraluminal bladder mass ? Cystoscopy-metastatic signet ring cell adenocarcinoma ? Cycle 1 day 1 ramucirumab/paclitaxel  08/19/2019(given atanother facility) ? Cycle 1 day 1 ramucirumab/paclitaxel 09/04/2019 ? Cycle 1 day 8 Taxol 09/10/19 ? Cycle 1 day 15 ramucirumab/paclitaxel 6/2/2021canceled d/t neutropenia ? Ramucirumab/pactlitaxelevery 2 weeks 09/24/2019 ? Ramucirumab/paclitaxel 10/08/2019 ? Ramucirumab/paclitaxel 10/22/2019 ? Ramucirumab/paclitaxel 11/05/2019 ? CTs 11/24/2019-unchanged thickening of the distal esophagus/upper stomach, gastric body and antrum. Unchanged thickening of the transverse colon at the hepatic flexure, unchanged thickening of the urinary bladder, no evidence of progressive disease ? Ramucirumab/paclitaxel 11/25/2019 ? Ramucirumab/paclitaxel 12/09/2019 ? Ramucirumab/paclitaxel 12/23/2019 ? Ramucirumab/paclitaxel9/21/2021 ? Ramucirumab/paclitaxel 01/20/2020 ? Ramucirumab/paclitaxel 02/04/2020 ? Ramucirumab/paclitaxel 02/17/2020 ? Ramucirumab/paclitaxel 03/02/2020 ? Ramucirumab/paclitaxel 03/16/2020 ? CTs 03/28/2020-stable thickening of the distal esophagus, GE junction, gastric body, and antrum. Decreased soft tissue thickening of the transverse colon at the hepatic flexure, no evidence of metastatic disease to the chest ? Ramucirumab/paclitaxel 03/30/2020 ? SBRT to the stomach 04/12/2020-05/06/2020, 37.5 Gray in 15 fractions ? Ramucirumab/paclitaxel 04/05/2020 ? Ramucirumab/paclitaxel 04/13/2020 ? Ramucirumab/paclitaxel 04/27/2020 ? Ramucirumab/paclitaxel 05/25/2020  2.Left breast cancer 2008,pT1c,pN0, status post a left lumpectomy with adjuvant chemotherapy and radiation, ER positive, PR positive, HER-2 positive  3.Mixed connective tissue disease/SLE  4.Lower extremity deep vein thrombosis maintained on apixaban  5.Family history of multiple cancers including breast and ovarian cancer  6.Dysphagia and intermittent vomiting secondary to #1  7.Hypertension  8.Peripheral neuropathy  9.Masslike fullness at the posterior right parotid/angle of the jaw  10.  Dyspnea on exertion, ongoing for about a month, worsened after taxol/ramucirumab on 09/04/19 requiring ED visit on 5/23. CBC, CMP, troponin, BNP and chest xray negative  11.Anemia likely secondary to combination of chemotherapy and blood loss  2 units packed red blood cells 04/12/2020 12.  Admission 05/11/2020 with pneumonia, CT chest revealed left upper and lower lobe opacities     Disposition: [name] will resume treatment with ramucirumab and paclitaxel today.  She appears to be tolerating the treatment well.  Nausea is partially improved following the course of palliative radiation.  She plans to consider radiation to the bladder to begin within the next few weeks.  She is recovering from a recent hospital admission with pneumonia.  I suspect the exertional dyspnea is related to deconditioning, pneumonia, and anemia.  She will return for an office visit and chemotherapy in 2 weeks.  The plan is to continue ramucirumab and paclitaxel until there is clear evidence of disease progression.  Betsy Coder, MD  05/26/2020  10:14 AM

## 2020-05-26 NOTE — Progress Notes (Signed)
  Radiation Oncology         [phone] ________________________________  Name: Michelle Cain MRN: [id number]  Date of Service: 05/31/2020  DOB: 1966-04-06  Post Treatment Telephone Note  Diagnosis:   stage IV gastric cancer    Interval Since Last Radiation:  4 weeks   04/12/20 - 05/06/20, The stomach and surrounding region was treated to a dose of 37.5 Gray in 15 fractions at 2.5 Pearline Cables per fraction.  This was accomplished using a 5 field 3D conformal technique.   Narrative:  The patient was contacted today for routine follow-up. During treatment she did very well with radiotherapy and did not have significant desquamation. She reports that she is feeling fine post radiation treatment.  Impression/Plan: 1. Stage IV gastric cancer. The patient has been doing well since completion of radiotherapy. We discussed that we would be happy to continue to follow her as needed, but she will also continue to follow up with Dr. Benay Spice in medical oncology.    Loma Messing, LPN

## 2020-05-26 NOTE — Progress Notes (Signed)
Patient presents to office today with her Thamas Jaegers. She gave RN verbal permission to perform assessment with him present.

## 2020-05-26 NOTE — Progress Notes (Signed)
Nutrition Follow-up:  Patient with gastric cancer, receiving chemotherapy and radiation. Noted recent hospital admission for pneumonia.  Met with patient in infusion.  Patient took few bites of chicken noodle soup and says she does not want any more.  Taking a few bites of crackers.  States that she started drinking 2 boost breeze shakes a few days ago.  Ate waffle with 2 pieces of [place] bacon this am. Eats oodles and noodles for lunch. Says that nausea is better.     Medications: reviewed  Labs: reviewed  Anthropometrics:   Weight 178 lb decreased from 188 lb on 1/11 199 lb on 11/30  NUTRITION DIAGNOSIS: Unintentional weight loss continues    INTERVENTION:  Increase boost breeze as able for more calories and protein. Patient does not like the milky shakes Encouraged patient to add protein to soups Patient may benefit from trial of appetite stimulant.   MONITORING, EVALUATION, GOAL: weight trends, intake   NEXT VISIT: to be determined with treatment  Orine Goga B. Zenia Resides, [place], [place] Registered Dietitian [phone] (mobile)

## 2020-05-26 NOTE — Patient Instructions (Signed)
[facility] Discharge Instructions for Patients Receiving Chemotherapy  Today you received the following chemotherapy agents: cyramza, paclitaxel.  To help prevent nausea and vomiting after your treatment, we encourage you to take your nausea medication as directed.    If you develop nausea and vomiting that is not controlled by your nausea medication, call the clinic.   BELOW ARE SYMPTOMS THAT SHOULD BE REPORTED IMMEDIATELY:  *FEVER GREATER THAN 100.5 F  *CHILLS WITH OR WITHOUT FEVER  NAUSEA AND VOMITING THAT IS NOT CONTROLLED WITH YOUR NAUSEA MEDICATION  *UNUSUAL SHORTNESS OF BREATH  *UNUSUAL BRUISING OR BLEEDING  TENDERNESS IN MOUTH AND THROAT WITH OR WITHOUT PRESENCE OF ULCERS  *URINARY PROBLEMS  *BOWEL PROBLEMS  UNUSUAL RASH Items with * indicate a potential emergency and should be followed up as soon as possible.  Feel free to call the clinic should you have any questions or concerns. The clinic phone number is [phone].  Please show the [facility] at check-in to the Emergency Department and triage nurse.

## 2020-05-26 NOTE — Patient Instructions (Signed)
Central Line, Adult A central line is a long, thin tube (catheter) that is put into a vein so that it goes to a large vein above your heart. It can be used to:  Give you medicine or fluids.  Give you food and nutrients.  Take blood or give you blood for testing or treatments. Types of central lines There are four main types of central lines:  Peripherally inserted central catheter (PICC) line. This type is usually put in the upper arm and goes up the arm to the heart.  Tunneled central line. This type is placed in a large vein in the neck, chest, or groin. It is tunneled under the skin and brought out through a second incision.  Non-tunneled central line. This type is used for a shorter time than other types, usually for 7 days at the most. It is inserted in the neck, chest, or groin.  Implanted port. This type can stay in place longer than other types of central lines. It is normally put in the upper chest but can also be placed in the upper arm or the belly. Surgery is needed to put it in and take it out. The type of central line you get will depend on how long you need it and your medical condition.   Tell a doctor about:  Any allergies you have.  All medicines you are taking. These include vitamins, herbs, eye drops, creams, and over-the-counter medicines.  Any problems you or family members have had with anesthetic medicines.  Any blood disorders you have.  Any surgeries you have had.  Any medical conditions you have.  Whether you are pregnant or may be pregnant. What are the risks? Generally, central lines are safe. However, problems may occur, including:  Infection.  A blood clot.  Bleeding from the place where the central line was inserted.  Getting a hole or crack in the central line. If this happens, the central line will need to be replaced.  Central line failure.  The catheter moving or coming out of place. What happens before the  procedure? Medicines  Ask your doctor about changing or stopping: ? Your normal medicines. ? Vitamins, herbs, and supplements. ? Over-the-counter medicines.  Do not take aspirin or ibuprofen unless you are told to. General instructions  Follow instructions from your doctor about eating or drinking.  For your safety, your doctor may: ? Mark the area of the procedure. ? Remove hair at the procedure site. ? Ask you to wash with a soap that kills germs.  Plan to have a responsible adult take you home from the hospital or clinic.  If you will be going home right after the procedure, plan to have a responsible adult care for you for the time you are told. This is important. What happens during the procedure?  An IV tube will be put into one of your veins.  You may be given: ? A sedative. This medicine helps you relax. ? Anesthetics. These medicines numb certain areas of your body.  Your skin will be cleaned with a germ-killing (antiseptic) solution. You may be covered with clean drapes.  Your blood pressure, heart rate, breathing rate, and blood oxygen level will be monitored during the procedure.  The central line will be put into the vein and moved through it to the correct spot. The doctor may use X-ray equipment to help guide the central line to the right place.  A bandage (dressing) will be placed over the insertion area.   The procedure may vary among doctors and hospitals. What can I expect after the procedure?  You will be monitored until you leave the hospital or clinic. This includes checking your blood pressure, heart rate, breathing rate, and blood oxygen level.  Caps may be placed on the ends of the central line tubing.  If you were given a sedative during your procedure, do not drive or use machines until your doctor says that it is safe. Follow these instructions at home: Caring for the tube  Follow instructions from your doctor about: ? Flushing the  tube. ? Cleaning the tube and the area around it.  Only use germ-free (sterile) supplies to flush. The supplies should be from your doctor, a pharmacy, or another place that your doctor recommends.  Before you flush the tube or clean the area around the tube: ? Wash your hands with soap and water for at least 20 seconds. If you cannot use soap and water, use hand sanitizer. ? Clean the central line hub with rubbing alcohol. To do this:  Scrub it using a twisting motion and rub for 10 to 15 seconds or for 30 twists. Follow the manufacturer's instructions.  Be sure you scrub the top of the hub, not just the sides. Never reuse alcohol pads.  Let the hub dry before use. Keep it from touching anything while drying.   Caring for your skin  Check the skin around the central line every day for signs of infection. Check for: ? Redness, swelling, or pain. ? Fluid or blood. ? Warmth. ? Pus or a bad smell.  Keep the area where the tube was put in clean and dry.  Change bandages only as told by your doctor.  Keep your bandage dry. If a bandage gets wet, have it changed right away. General instructions  Keep the tube clamped, unless it is being used.  If you or someone else accidentally pulls on the tube, make sure: ? The bandage is okay. ? There is no bleeding. ? The tube has not been pulled out.  Do not use scissors or sharp objects near the tube.  Do not take baths, swim, or use a hot tub until your doctor says it is okay. Ask your doctor if you may take showers. You may only be allowed to take sponge baths.  Ask your doctor what activities are safe for you. Your doctor may tell you not to lift anything or move your arm too much.  Take over-the-counter and prescription medicines only as told by your doctor.  Keep all follow-up visits. Storing and throwing away supplies  Keep your supplies in a clean, dry location.  Throw away any used syringes in a container that is only for  sharp items (sharps container). You can buy a sharps container from a pharmacy, or you can make one by using an empty hard plastic bottle with a cover.  Place any used bandages or infusion bags into a plastic bag. Throw that bag in the trash. Contact a doctor if:  You have any of these signs of infection where the tube was put in: ? Redness, swelling, or pain. ? Fluid or blood. ? Warmth. ? Pus or a bad smell. Get help right away if:  You have: ? A fever or chills. ? Shortness of breath. ? Pain in your chest. ? A fast heartbeat. ? Swelling in your neck, face, chest, or arm.  You feel dizzy or you faint.  There are red lines coming from   where the tube was put in.  The area where the tube was put in is bleeding and the bleeding will not stop.  Your tube is hard to flush.  You do not get a blood return from the tube.  The tube gets loose or comes out.  The tube has a hole or a tear.  The tube leaks. Summary  A central line is a long, thin tube (catheter) that is put in your vein. It can be used to give you medicine, food, or fluids.  Follow instructions from your doctor about flushing and cleaning the tube.  Keep the area where the tube was put in clean and dry.  Ask your doctor what activities are safe for you. This information is not intended to replace advice given to you by your health care provider. Make sure you discuss any questions you have with your health care provider. Document Revised: 12/04/2019 Document Reviewed: 12/04/2019 Elsevier Patient Education  2021 Elsevier Inc.  

## 2020-05-31 ENCOUNTER — Telehealth: Payer: Self-pay

## 2020-05-31 ENCOUNTER — Ambulatory Visit
Admission: RE | Admit: 2020-05-31 | Discharge: 2020-05-31 | Disposition: A | Discharge status: Home / Self Care | Payer: Medicare Other | Source: Ambulatory Visit | Attending: Radiation Oncology | Admitting: Radiation Oncology

## 2020-05-31 ENCOUNTER — Other Ambulatory Visit: Payer: Self-pay

## 2020-05-31 DIAGNOSIS — C169 Malignant neoplasm of stomach, unspecified: Secondary | ICD-10-CM | POA: Insufficient documentation

## 2020-05-31 NOTE — Telephone Encounter (Signed)
Spoke with patient to follow-up with post radiation treatment symptoms. TM

## 2020-06-01 ENCOUNTER — Telehealth: Payer: Self-pay | Admitting: Oncology

## 2020-06-01 NOTE — Telephone Encounter (Signed)
Scheduled appointments per 2/9 los. Spoke to patient who is aware of appointments date and times. Scheduled appointments on 2/25 per MD approval.

## 2020-06-05 ENCOUNTER — Other Ambulatory Visit: Payer: Self-pay | Admitting: Oncology

## 2020-06-08 ENCOUNTER — Other Ambulatory Visit: Payer: Medicare Other

## 2020-06-08 ENCOUNTER — Ambulatory Visit: Payer: Medicare Other | Admitting: Oncology

## 2020-06-10 ENCOUNTER — Other Ambulatory Visit: Payer: Medicare Other

## 2020-06-10 ENCOUNTER — Ambulatory Visit: Payer: Medicare Other | Admitting: Nurse Practitioner

## 2020-06-11 ENCOUNTER — Inpatient Hospital Stay: Payer: Medicare Other

## 2020-06-11 ENCOUNTER — Other Ambulatory Visit: Payer: Self-pay

## 2020-06-11 ENCOUNTER — Encounter: Payer: Self-pay | Admitting: Nurse Practitioner

## 2020-06-11 ENCOUNTER — Inpatient Hospital Stay (HOSPITAL_BASED_OUTPATIENT_CLINIC_OR_DEPARTMENT_OTHER): Payer: Medicare Other | Admitting: Nurse Practitioner

## 2020-06-11 VITALS — BP 116/74 | HR 111 | Temp 97.1°F | Resp 16 | Ht 64.0 in | Wt 178.4 lb

## 2020-06-11 DIAGNOSIS — C169 Malignant neoplasm of stomach, unspecified: Secondary | ICD-10-CM

## 2020-06-11 DIAGNOSIS — Z95828 Presence of other vascular implants and grafts: Secondary | ICD-10-CM

## 2020-06-11 DIAGNOSIS — Z5112 Encounter for antineoplastic immunotherapy: Secondary | ICD-10-CM | POA: Diagnosis not present

## 2020-06-11 LAB — CBC WITH DIFFERENTIAL (CANCER CENTER ONLY)
Abs Immature Granulocytes: 0 10*3/uL (ref 0.00–0.07)
Basophils Absolute: 0 10*3/uL (ref 0.0–0.1)
Basophils Relative: 0 %
Eosinophils Absolute: 0 10*3/uL (ref 0.0–0.5)
Eosinophils Relative: 0 %
HCT: 29 % — ABNORMAL LOW (ref 36.0–46.0)
Hemoglobin: 8.8 g/dL — ABNORMAL LOW (ref 12.0–15.0)
Immature Granulocytes: 0 %
Lymphocytes Relative: 55 %
Lymphs Abs: 2.1 10*3/uL (ref 0.7–4.0)
MCH: 25.3 pg — ABNORMAL LOW (ref 26.0–34.0)
MCHC: 30.3 g/dL (ref 30.0–36.0)
MCV: 83.3 fL (ref 80.0–100.0)
Monocytes Absolute: 0.7 10*3/uL (ref 0.1–1.0)
Monocytes Relative: 17 %
Neutro Abs: 1.1 10*3/uL — ABNORMAL LOW (ref 1.7–7.7)
Neutrophils Relative %: 28 %
Platelet Count: 297 10*3/uL (ref 150–400)
RBC: 3.48 MIL/uL — ABNORMAL LOW (ref 3.87–5.11)
RDW: 21.6 % — ABNORMAL HIGH (ref 11.5–15.5)
WBC Count: 3.9 10*3/uL — ABNORMAL LOW (ref 4.0–10.5)
nRBC: 0 % (ref 0.0–0.2)

## 2020-06-11 LAB — CMP (CANCER CENTER ONLY)
ALT: 10 U/L (ref 0–44)
AST: 20 U/L (ref 15–41)
Albumin: 3.2 g/dL — ABNORMAL LOW (ref 3.5–5.0)
Alkaline Phosphatase: 68 U/L (ref 38–126)
Anion gap: 8 (ref 5–15)
BUN: 10 mg/dL (ref 6–20)
CO2: 24 mmol/L (ref 22–32)
Calcium: 8.7 mg/dL — ABNORMAL LOW (ref 8.9–10.3)
Chloride: 106 mmol/L (ref 98–111)
Creatinine: 0.67 mg/dL (ref 0.44–1.00)
GFR, Estimated: >60 mL/min (ref >60)
Glucose, Bld: 105 mg/dL — ABNORMAL HIGH (ref 70–99)
Potassium: 4 mmol/L (ref 3.5–5.1)
Sodium: 138 mmol/L (ref 135–145)
Total Bilirubin: 0.3 mg/dL (ref 0.3–1.2)
Total Protein: 7.1 g/dL (ref 6.5–8.1)

## 2020-06-11 MED ORDER — FAMOTIDINE IN NACL 20-0.9 MG/50ML-% IV SOLN
INTRAVENOUS | Status: AC
  Filled 2020-06-11: qty 50

## 2020-06-11 MED ORDER — HEPARIN SOD (PORK) LOCK FLUSH 100 UNIT/ML IV SOLN
500.0000 [IU] | Freq: Once | INTRAVENOUS | Status: AC | PRN
Start: 2020-06-11 — End: 2020-06-11
  Administered 2020-06-11: 500 [IU]
  Filled 2020-06-11: qty 5

## 2020-06-11 MED ORDER — SODIUM CHLORIDE 0.9% FLUSH
10.0000 mL | Freq: Once | INTRAVENOUS | Status: AC
  Administered 2020-06-11: 10 mL
  Filled 2020-06-11: qty 10

## 2020-06-11 MED ORDER — SODIUM CHLORIDE 0.9 % IV SOLN
Freq: Once | INTRAVENOUS | Status: AC
  Filled 2020-06-11: qty 250

## 2020-06-11 MED ORDER — SODIUM CHLORIDE 0.9 % IV SOLN
8.0000 mg/kg | Freq: Once | INTRAVENOUS | Status: AC
  Administered 2020-06-11: 700 mg via INTRAVENOUS
  Filled 2020-06-11: qty 50

## 2020-06-11 MED ORDER — SODIUM CHLORIDE 0.9% FLUSH
10.0000 mL | INTRAVENOUS | Status: DC | PRN
  Administered 2020-06-11: 10 mL
  Filled 2020-06-11: qty 10

## 2020-06-11 MED ORDER — SODIUM CHLORIDE 0.9 % IV SOLN
10.0000 mg | Freq: Once | INTRAVENOUS | Status: AC
  Administered 2020-06-11: 10 mg via INTRAVENOUS
  Filled 2020-06-11: qty 10

## 2020-06-11 MED ORDER — SODIUM CHLORIDE 0.9 % IV SOLN
60.0000 mg/m2 | Freq: Once | INTRAVENOUS | Status: AC
  Administered 2020-06-11: 114 mg via INTRAVENOUS
  Filled 2020-06-11: qty 19

## 2020-06-11 MED ORDER — DIPHENHYDRAMINE HCL 50 MG/ML IJ SOLN
25.0000 mg | Freq: Once | INTRAMUSCULAR | Status: AC
  Administered 2020-06-11: 25 mg via INTRAVENOUS

## 2020-06-11 MED ORDER — FAMOTIDINE IN NACL 20-0.9 MG/50ML-% IV SOLN
20.0000 mg | Freq: Once | INTRAVENOUS | Status: AC
  Administered 2020-06-11: 20 mg via INTRAVENOUS

## 2020-06-11 MED ORDER — DIPHENHYDRAMINE HCL 50 MG/ML IJ SOLN
INTRAMUSCULAR | Status: AC
  Filled 2020-06-11: qty 1

## 2020-06-11 NOTE — Progress Notes (Signed)
Per Ned Card NP, ok to treat with elevated HR and ANC 1.1.

## 2020-06-11 NOTE — Patient Instructions (Signed)
[facility] Discharge Instructions for Patients Receiving Chemotherapy  Today you received the following chemotherapy agents: ramucirumab and paclitaxel.  To help prevent nausea and vomiting after your treatment, we encourage you to take your nausea medication as directed.   If you develop nausea and vomiting that is not controlled by your nausea medication, call the clinic.   BELOW ARE SYMPTOMS THAT SHOULD BE REPORTED IMMEDIATELY:  *FEVER GREATER THAN 100.5 F  *CHILLS WITH OR WITHOUT FEVER  NAUSEA AND VOMITING THAT IS NOT CONTROLLED WITH YOUR NAUSEA MEDICATION  *UNUSUAL SHORTNESS OF BREATH  *UNUSUAL BRUISING OR BLEEDING  TENDERNESS IN MOUTH AND THROAT WITH OR WITHOUT PRESENCE OF ULCERS  *URINARY PROBLEMS  *BOWEL PROBLEMS  UNUSUAL RASH Items with * indicate a potential emergency and should be followed up as soon as possible.  Feel free to call the clinic should you have any questions or concerns. The clinic phone number is [phone].  Please show the [facility] at check-in to the Emergency Department and triage nurse.

## 2020-06-11 NOTE — Patient Instructions (Signed)
Implanted Port Insertion, Care After This sheet gives you information about how to care for yourself after your procedure. Your health care provider may also give you more specific instructions. If you have problems or questions, contact your health care provider. What can I expect after the procedure? After the procedure, it is common to have:  Discomfort at the port insertion site.  Bruising on the skin over the port. This should improve over 3-4 days. Follow these instructions at home: Port care  After your port is placed, you will get a manufacturer's information card. The card has information about your port. Keep this card with you at all times.  Take care of the port as told by your health care provider. Ask your health care provider if you or a family member can get training for taking care of the port at home. A home health care nurse may also take care of the port.  Make sure to remember what type of port you have. Incision care  Follow instructions from your health care provider about how to take care of your port insertion site. Make sure you: ? Wash your hands with soap and water before and after you change your bandage (dressing). If soap and water are not available, use hand sanitizer. ? Change your dressing as told by your health care provider. ? Leave stitches (sutures), skin glue, or adhesive strips in place. These skin closures may need to stay in place for 2 weeks or longer. If adhesive strip edges start to loosen and curl up, you may trim the loose edges. Do not remove adhesive strips completely unless your health care provider tells you to do that.  Check your port insertion site every day for signs of infection. Check for: ? Redness, swelling, or pain. ? Fluid or blood. ? Warmth. ? Pus or a bad smell.      Activity  Return to your normal activities as told by your health care provider. Ask your health care provider what activities are safe for you.  Do not  lift anything that is heavier than 10 lb (4.5 kg), or the limit that you are told, until your health care provider says that it is safe. General instructions  Take over-the-counter and prescription medicines only as told by your health care provider.  Do not take baths, swim, or use a hot tub until your health care provider approves. Ask your health care provider if you may take showers. You may only be allowed to take sponge baths.  Do not drive for 24 hours if you were given a sedative during your procedure.  Wear a medical alert bracelet in case of an emergency. This will tell any health care providers that you have a port.  Keep all follow-up visits as told by your health care provider. This is important. Contact a health care provider if:  You cannot flush your port with saline as directed, or you cannot draw blood from the port.  You have a fever or chills.  You have redness, swelling, or pain around your port insertion site.  You have fluid or blood coming from your port insertion site.  Your port insertion site feels warm to the touch.  You have pus or a bad smell coming from the port insertion site. Get help right away if:  You have chest pain or shortness of breath.  You have bleeding from your port that you cannot control. Summary  Take care of the port as told by your   health care provider. Keep the manufacturer's information card with you at all times.  Change your dressing as told by your health care provider.  Contact a health care provider if you have a fever or chills or if you have redness, swelling, or pain around your port insertion site.  Keep all follow-up visits as told by your health care provider. This information is not intended to replace advice given to you by your health care provider. Make sure you discuss any questions you have with your health care provider. Document Revised: 10/30/2017 Document Reviewed: 10/30/2017 Elsevier Patient Education   2021 Elsevier Inc.  

## 2020-06-11 NOTE — Progress Notes (Signed)
[place] OFFICE PROGRESS NOTE   Diagnosis: Gastric cancer  INTERVAL HISTORY:   [name] returns as scheduled.  She completed a cycle of Taxol ramucirumab 05/26/2020.  She denies nausea/vomiting.  No mouth sores.  No diarrhea.  She reports recent mid to low left-sided back pain.  She has been applying a "cream" as recommended by her PCP.  She denies pain today.  No known injury.  No cough or fever.  Dyspnea on exertion continues to improve.  No bleeding.  Objective:  Vital signs in last 24 hours:  Blood pressure 116/74, pulse (!) 111, temperature (!) 97.1 F (36.2 C), temperature source Tympanic, resp. rate 16, height _0  (1.626 m), weight 178 lb 6.4 oz (80.9 kg), SpO2 100 %.    HEENT: No thrush or ulcers. Resp: Lungs clear bilaterally. Cardio: Regular rate and rhythm. GI: Abdomen soft and nontender.  No hepatomegaly. Vascular: No leg edema. Musculoskeletal: Tender left mid back.  No palpable mass. Skin: No rash. Port-A-Cath without erythema.  Lab Results:  Lab Results  Component Value Date   WBC 3.9 (L) 06/11/2020   HGB 8.8 (L) 06/11/2020   HCT 29.0 (L) 06/11/2020   MCV 83.3 06/11/2020   PLT 297 06/11/2020   NEUTROABS 1.1 (L) 06/11/2020    Imaging:  No results found.  Medications: I have reviewed the patient's current medications.  Assessment/Plan: 1. Gastric cancer ? Presenting with dysphagia spring 2019 ? 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 CTs revealed wall thickening and luminal narrowing of the colon at the hepatic flexure and cecum ? PET scan with no evidence of distant metastatic disease or abnormal uptake other than thickening of the  distal esophagus and proximal stomach ? 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 CTs 10/23/2017-diffuse prominence of the gastric wall, especially the antrum, focal wall thickening of the distal ascending colon and hepatic flexure, thickening of the cecum, nonspecific haziness of the mesenteric fat in the pelvis, mild prominence of lymph nodes at the greater curvature of the stomach ? Treatment with FOLFOX beginning July 2019 ? CTs October 2019-stable disease, FOLFOX continued ? CTs December 2019-stable disease ? January 2020 treatment transition to Xeloda maintenance, care transition to [place] ? CTs in June 2020 in September 2020-stable disease, Xeloda continued ? CTs 06/23/2019-nonspecific thickening of the GE junction, intraluminal bladder mass ? Cystoscopy-metastatic signet ring cell adenocarcinoma ? Cycle 1 day 1 ramucirumab/paclitaxel 08/19/2019(given atanother facility) ? Cycle 1 day 1 ramucirumab/paclitaxel 09/04/2019 ? Cycle 1 day 8 Taxol 09/10/19 ? Cycle 1 day 15 ramucirumab/paclitaxel 6/2/2021canceled d/t neutropenia ? Ramucirumab/pactlitaxelevery 2 weeks 09/24/2019 ? Ramucirumab/paclitaxel 10/08/2019 ? Ramucirumab/paclitaxel 10/22/2019 ? Ramucirumab/paclitaxel 11/05/2019 ? CTs 11/24/2019-unchanged thickening of the distal esophagus/upper stomach, gastric body and antrum. Unchanged thickening of the transverse colon at the hepatic flexure, unchanged thickening of the urinary bladder, no evidence of progressive disease ? Ramucirumab/paclitaxel 11/25/2019 ? Ramucirumab/paclitaxel 12/09/2019 ? Ramucirumab/paclitaxel  12/23/2019 ? Ramucirumab/paclitaxel9/21/2021 ? Ramucirumab/paclitaxel 01/20/2020 ? Ramucirumab/paclitaxel 02/04/2020 ? Ramucirumab/paclitaxel 02/17/2020 ? Ramucirumab/paclitaxel 03/02/2020 ?  Ramucirumab/paclitaxel 03/16/2020 ? CTs 03/28/2020-stable thickening of the distal esophagus, GE junction, gastric body, and antrum. Decreased soft tissue thickening of the transverse colon at the hepatic flexure, no evidence of metastatic disease to the chest ? Ramucirumab/paclitaxel 03/30/2020 ? SBRT to the stomach 04/12/2020-05/06/2020, 37.5 Gray in 15 fractions ? Ramucirumab/paclitaxel 04/05/2020 ? Ramucirumab/paclitaxel 04/13/2020 ? Ramucirumab/paclitaxel 04/27/2020 ? Ramucirumab/paclitaxel 05/25/2020 ? Ramucirumab/paclitaxel 06/11/2020  2.Left breast cancer 2008,pT1c,pN0, status post a left lumpectomy with adjuvant chemotherapy and radiation, ER positive, PR positive, HER-2 positive  3.Mixed connective tissue disease/SLE  4.Lower extremity deep vein thrombosis maintained on apixaban  5.Family history of multiple cancers including breast and ovarian cancer  6.Dysphagia and intermittent vomiting secondary to #1  7.Hypertension  8.Peripheral neuropathy  9.Masslike fullness at the posterior right parotid/angle of the jaw  10. Dyspnea on exertion, ongoing for about a month, worsened after taxol/ramucirumab on 09/04/19 requiring ED visit on 5/23. CBC, CMP, troponin, BNP and chest xray negative  11.Anemia likely secondary to combination of chemotherapy and blood loss  2 units packed red blood cells 04/12/2020 12.  Admission 05/11/2020 with pneumonia, CT chest revealed left upper and lower lobe opacities   Disposition: [name] appears stable.  She is on active treatment with Taxol/ramucirumab.  There is no clinical evidence of disease progression.  Plan to proceed with treatment today as scheduled.  We reviewed the CBC from today.  Labs adequate to proceed with  treatment.  She has mild neutropenia.  She will contact the office with fever, chills, other signs of infection.  The back pain is likely benign musculoskeletal pain as it has improved over the past few days and she is having no pain today.  She will contact the office if the pain recurs.  She will return for follow-up and treatment in 2 weeks.  She will contact the office in the interim as outlined above or with any other problems.  Plan reviewed with Dr. Benay Spice.   Ned Card ANP/GNP-BC   06/11/2020  1:08 PM

## 2020-06-14 ENCOUNTER — Telehealth: Payer: Self-pay | Admitting: Nurse Practitioner

## 2020-06-14 ENCOUNTER — Telehealth: Payer: Self-pay | Admitting: *Deleted

## 2020-06-14 NOTE — Telephone Encounter (Signed)
Reports she had syncope episode on 06/13/20 going out to dinner. She started feeling hot and then next thing she knew, she was in the floor. She thinks she may have been out of it for ~ 3 minutes. Saw PCP today and they did labs at 3 pm and told her to stop her amlodipine 5 mg. Thinks she may have been dehydrated. Will call her w/results tomorrow. Informed her this office will f/u on her labs, but if she is called first ask them to send [place] results.

## 2020-06-14 NOTE — Telephone Encounter (Signed)
Scheduled appointments per 2/25 los. Spoke to patient who is aware of appointments date and times.

## 2020-06-20 ENCOUNTER — Other Ambulatory Visit: Payer: Self-pay | Admitting: Oncology

## 2020-06-22 ENCOUNTER — Inpatient Hospital Stay (HOSPITAL_BASED_OUTPATIENT_CLINIC_OR_DEPARTMENT_OTHER): Payer: Medicare Other | Admitting: Oncology

## 2020-06-22 ENCOUNTER — Other Ambulatory Visit: Payer: Self-pay

## 2020-06-22 ENCOUNTER — Inpatient Hospital Stay: Payer: Medicare Other | Attending: Oncology

## 2020-06-22 ENCOUNTER — Inpatient Hospital Stay: Payer: Medicare Other

## 2020-06-22 ENCOUNTER — Ambulatory Visit: Payer: Medicare Other | Admitting: Nurse Practitioner

## 2020-06-22 VITALS — HR 86

## 2020-06-22 VITALS — BP 118/73 | HR 113 | Temp 97.0°F | Resp 18 | Ht 64.0 in | Wt 178.6 lb

## 2020-06-22 DIAGNOSIS — R1310 Dysphagia, unspecified: Secondary | ICD-10-CM | POA: Insufficient documentation

## 2020-06-22 DIAGNOSIS — R319 Hematuria, unspecified: Secondary | ICD-10-CM | POA: Diagnosis not present

## 2020-06-22 DIAGNOSIS — C169 Malignant neoplasm of stomach, unspecified: Secondary | ICD-10-CM

## 2020-06-22 DIAGNOSIS — Z5111 Encounter for antineoplastic chemotherapy: Secondary | ICD-10-CM | POA: Insufficient documentation

## 2020-06-22 DIAGNOSIS — Z853 Personal history of malignant neoplasm of breast: Secondary | ICD-10-CM | POA: Insufficient documentation

## 2020-06-22 DIAGNOSIS — G629 Polyneuropathy, unspecified: Secondary | ICD-10-CM | POA: Insufficient documentation

## 2020-06-22 DIAGNOSIS — I10 Essential (primary) hypertension: Secondary | ICD-10-CM | POA: Insufficient documentation

## 2020-06-22 DIAGNOSIS — Z95828 Presence of other vascular implants and grafts: Secondary | ICD-10-CM

## 2020-06-22 DIAGNOSIS — Z5112 Encounter for antineoplastic immunotherapy: Secondary | ICD-10-CM | POA: Insufficient documentation

## 2020-06-22 DIAGNOSIS — D709 Neutropenia, unspecified: Secondary | ICD-10-CM | POA: Diagnosis not present

## 2020-06-22 DIAGNOSIS — D649 Anemia, unspecified: Secondary | ICD-10-CM | POA: Insufficient documentation

## 2020-06-22 DIAGNOSIS — R112 Nausea with vomiting, unspecified: Secondary | ICD-10-CM | POA: Insufficient documentation

## 2020-06-22 LAB — CMP (CANCER CENTER ONLY)
ALT: 17 U/L (ref 0–44)
AST: 25 U/L (ref 15–41)
Albumin: 3 g/dL — ABNORMAL LOW (ref 3.5–5.0)
Alkaline Phosphatase: 69 U/L (ref 38–126)
Anion gap: 6 (ref 5–15)
BUN: 10 mg/dL (ref 6–20)
CO2: 24 mmol/L (ref 22–32)
Calcium: 8.4 mg/dL — ABNORMAL LOW (ref 8.9–10.3)
Chloride: 105 mmol/L (ref 98–111)
Creatinine: 0.75 mg/dL (ref 0.44–1.00)
GFR, Estimated: >60 mL/min (ref >60)
Glucose, Bld: 135 mg/dL — ABNORMAL HIGH (ref 70–99)
Potassium: 4 mmol/L (ref 3.5–5.1)
Sodium: 135 mmol/L (ref 135–145)
Total Bilirubin: 0.3 mg/dL (ref 0.3–1.2)
Total Protein: 6.6 g/dL (ref 6.5–8.1)

## 2020-06-22 LAB — CBC WITH DIFFERENTIAL (CANCER CENTER ONLY)
Abs Immature Granulocytes: 0.01 10*3/uL (ref 0.00–0.07)
Basophils Absolute: 0 10*3/uL (ref 0.0–0.1)
Basophils Relative: 0 %
Eosinophils Absolute: 0 10*3/uL (ref 0.0–0.5)
Eosinophils Relative: 0 %
HCT: 27.7 % — ABNORMAL LOW (ref 36.0–46.0)
Hemoglobin: 8.3 g/dL — ABNORMAL LOW (ref 12.0–15.0)
Immature Granulocytes: 0 %
Lymphocytes Relative: 48 %
Lymphs Abs: 1.5 10*3/uL (ref 0.7–4.0)
MCH: 24.6 pg — ABNORMAL LOW (ref 26.0–34.0)
MCHC: 30 g/dL (ref 30.0–36.0)
MCV: 82 fL (ref 80.0–100.0)
Monocytes Absolute: 0.3 10*3/uL (ref 0.1–1.0)
Monocytes Relative: 8 %
Neutro Abs: 1.4 10*3/uL — ABNORMAL LOW (ref 1.7–7.7)
Neutrophils Relative %: 44 %
Platelet Count: 249 10*3/uL (ref 150–400)
RBC: 3.38 MIL/uL — ABNORMAL LOW (ref 3.87–5.11)
RDW: 21.9 % — ABNORMAL HIGH (ref 11.5–15.5)
WBC Count: 3.2 10*3/uL — ABNORMAL LOW (ref 4.0–10.5)
nRBC: 0 % (ref 0.0–0.2)

## 2020-06-22 MED ORDER — SODIUM CHLORIDE 0.9 % IV SOLN
Freq: Once | INTRAVENOUS | Status: AC
  Filled 2020-06-22: qty 250

## 2020-06-22 MED ORDER — SODIUM CHLORIDE 0.9% FLUSH
10.0000 mL | INTRAVENOUS | Status: DC | PRN
  Administered 2020-06-22: 10 mL
  Filled 2020-06-22: qty 10

## 2020-06-22 MED ORDER — SODIUM CHLORIDE 0.9% FLUSH
10.0000 mL | Freq: Once | INTRAVENOUS | Status: AC
  Administered 2020-06-22: 10 mL
  Filled 2020-06-22: qty 10

## 2020-06-22 MED ORDER — DIPHENHYDRAMINE HCL 50 MG/ML IJ SOLN
25.0000 mg | Freq: Once | INTRAMUSCULAR | Status: AC
  Administered 2020-06-22: 25 mg via INTRAVENOUS

## 2020-06-22 MED ORDER — SODIUM CHLORIDE 0.9 % IV SOLN
60.0000 mg/m2 | Freq: Once | INTRAVENOUS | Status: AC
  Administered 2020-06-22: 114 mg via INTRAVENOUS
  Filled 2020-06-22: qty 19

## 2020-06-22 MED ORDER — FAMOTIDINE IN NACL 20-0.9 MG/50ML-% IV SOLN
20.0000 mg | Freq: Once | INTRAVENOUS | Status: AC
  Administered 2020-06-22: 20 mg via INTRAVENOUS

## 2020-06-22 MED ORDER — FAMOTIDINE IN NACL 20-0.9 MG/50ML-% IV SOLN
INTRAVENOUS | Status: AC
  Filled 2020-06-22: qty 50

## 2020-06-22 MED ORDER — SODIUM CHLORIDE 0.9 % IV SOLN
8.0000 mg/kg | Freq: Once | INTRAVENOUS | Status: AC
  Administered 2020-06-22: 700 mg via INTRAVENOUS
  Filled 2020-06-22: qty 50

## 2020-06-22 MED ORDER — FAMOTIDINE IN NACL 20-0.9 MG/50ML-% IV SOLN: 20.0000 mg | Freq: Once | INTRAVENOUS | Status: DC

## 2020-06-22 MED ORDER — DIPHENHYDRAMINE HCL 50 MG/ML IJ SOLN
INTRAMUSCULAR | Status: AC
  Filled 2020-06-22: qty 1

## 2020-06-22 MED ORDER — SODIUM CHLORIDE 0.9 % IV SOLN
10.0000 mg | Freq: Once | INTRAVENOUS | Status: AC
  Administered 2020-06-22: 10 mg via INTRAVENOUS
  Filled 2020-06-22: qty 10

## 2020-06-22 MED ORDER — HEPARIN SOD (PORK) LOCK FLUSH 100 UNIT/ML IV SOLN
500.0000 [IU] | Freq: Once | INTRAVENOUS | Status: AC | PRN
  Administered 2020-06-22: 500 [IU]
  Filled 2020-06-22: qty 5

## 2020-06-22 NOTE — Progress Notes (Signed)
Per Dr. Benay Spice: OK to treat w/ANC 1.4

## 2020-06-22 NOTE — Patient Instructions (Signed)
Implanted Port Insertion, Care After This sheet gives you information about how to care for yourself after your procedure. Your health care provider may also give you more specific instructions. If you have problems or questions, contact your health care provider. What can I expect after the procedure? After the procedure, it is common to have:  Discomfort at the port insertion site.  Bruising on the skin over the port. This should improve over 3-4 days. Follow these instructions at home: Port care  After your port is placed, you will get a manufacturer's information card. The card has information about your port. Keep this card with you at all times.  Take care of the port as told by your health care provider. Ask your health care provider if you or a family member can get training for taking care of the port at home. A home health care nurse may also take care of the port.  Make sure to remember what type of port you have. Incision care  Follow instructions from your health care provider about how to take care of your port insertion site. Make sure you: ? Wash your hands with soap and water before and after you change your bandage (dressing). If soap and water are not available, use hand sanitizer. ? Change your dressing as told by your health care provider. ? Leave stitches (sutures), skin glue, or adhesive strips in place. These skin closures may need to stay in place for 2 weeks or longer. If adhesive strip edges start to loosen and curl up, you may trim the loose edges. Do not remove adhesive strips completely unless your health care provider tells you to do that.  Check your port insertion site every day for signs of infection. Check for: ? Redness, swelling, or pain. ? Fluid or blood. ? Warmth. ? Pus or a bad smell.      Activity  Return to your normal activities as told by your health care provider. Ask your health care provider what activities are safe for you.  Do not  lift anything that is heavier than 10 lb (4.5 kg), or the limit that you are told, until your health care provider says that it is safe. General instructions  Take over-the-counter and prescription medicines only as told by your health care provider.  Do not take baths, swim, or use a hot tub until your health care provider approves. Ask your health care provider if you may take showers. You may only be allowed to take sponge baths.  Do not drive for 24 hours if you were given a sedative during your procedure.  Wear a medical alert bracelet in case of an emergency. This will tell any health care providers that you have a port.  Keep all follow-up visits as told by your health care provider. This is important. Contact a health care provider if:  You cannot flush your port with saline as directed, or you cannot draw blood from the port.  You have a fever or chills.  You have redness, swelling, or pain around your port insertion site.  You have fluid or blood coming from your port insertion site.  Your port insertion site feels warm to the touch.  You have pus or a bad smell coming from the port insertion site. Get help right away if:  You have chest pain or shortness of breath.  You have bleeding from your port that you cannot control. Summary  Take care of the port as told by your   health care provider. Keep the manufacturer's information card with you at all times.  Change your dressing as told by your health care provider.  Contact a health care provider if you have a fever or chills or if you have redness, swelling, or pain around your port insertion site.  Keep all follow-up visits as told by your health care provider. This information is not intended to replace advice given to you by your health care provider. Make sure you discuss any questions you have with your health care provider. Document Revised: 10/30/2017 Document Reviewed: 10/30/2017 Elsevier Patient Education   2021 Elsevier Inc.  

## 2020-06-22 NOTE — Progress Notes (Signed)
Nutrition Follow-up:   Patient with gastric cancer, receiving chemotherapy and radiation.  Noted syncope event recently.   Met with patient during infusion.  Patient reports that her appetite is better.  She has been drinking boost breeze 3 times per day between meals.  Yesterday was able to eat bagel with bacon and cheese. Dinner last night was [place] wing, rice and collard greens.  Snacks on peanut butter and jelly sandwiches or peanut butter crackers.    Reports less nausea.  Medications: reviewed  Labs: reviewed  Anthropometrics:   Weight 178 lb 9.6 oz today stable from weight on 178 lb on 2/9  188 lb on 1/11 199 lb on 11/30   NUTRITION DIAGNOSIS: Unintentional weight loss stable   INTERVENTION:  Continue boost breeze TID between meals for added nutrition Encouraged patient to continue to increase calories and protein    MONITORING, EVALUATION, GOAL: weight trends, intake   NEXT VISIT: March 22 f/u in infusion  Michelle Cain, [place], [place] Registered Dietitian [phone] (mobile)

## 2020-06-22 NOTE — Progress Notes (Signed)
[place] OFFICE PROGRESS NOTE   Diagnosis: Gastric cancer  INTERVAL HISTORY:   [name] completed another treatment with Taxol/ramucirumab on 06/11/2020.  She had a syncope event while eating in a restaurant on 06/14/2020.  She was with her mother and family.  She felt "hot "and slid to the floor with the assistance of a family member.  She saw her primary provider and amlodipine was discontinued.  No recurrent syncope.  She reports nausea and dysphagia have improved.  Hematuria has also improved.  Stable neuropathy symptoms.  She reports intermittent pain at the left upper back.  Objective:  Vital signs in last 24 hours:  Blood pressure 118/73, pulse (!) 113, temperature (!) 97 F (36.1 C), temperature source Tympanic, resp. rate 18, height _0  (1.626 m), weight 178 lb 9.6 oz (81 kg), SpO2 100 %.    HEENT: No thrush or ulcers Resp: Lungs clear bilaterally Cardio: Regular rate and rhythm GI: Fullness in the upper abdomen without a discrete mass Vascular: No leg edema Musculoskeletal: No mass or rash at the left upper back.  No tenderness.  Portacath/PICC-without erythema  Lab Results:  Lab Results  Component Value Date   WBC 3.2 (L) 06/22/2020   HGB 8.3 (L) 06/22/2020   HCT 27.7 (L) 06/22/2020   MCV 82.0 06/22/2020   PLT 249 06/22/2020   NEUTROABS 1.4 (L) 06/22/2020    CMP  Lab Results  Component Value Date   NA 135 06/22/2020   K 4.0 06/22/2020   CL 105 06/22/2020   CO2 24 06/22/2020   GLUCOSE 135 (H) 06/22/2020   BUN 10 06/22/2020   CREATININE 0.75 06/22/2020   CALCIUM 8.4 (L) 06/22/2020   PROT 6.6 06/22/2020   ALBUMIN 3.0 (L) 06/22/2020   AST 25 06/22/2020   ALT 17 06/22/2020   ALKPHOS 69 06/22/2020   BILITOT 0.3 06/22/2020   GFRNONAA >60 06/22/2020   GFRAA >60 01/06/2020    Lab Results  Component Value Date   CEA1 3.45 09/04/2019     Medications: I have reviewed the patient's current  medications.   Assessment/Plan: 1. Gastric cancer ? Presenting with dysphagia spring 2019 ? 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 CTs revealed wall thickening and luminal narrowing of the colon at the hepatic flexure and cecum ? PET scan with no evidence of distant metastatic disease or abnormal uptake other than thickening of the distal esophagus and proximal stomach ? 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 CTs 10/23/2017-diffuse prominence of the gastric wall, especially the antrum, focal wall thickening of the distal ascending colon and hepatic flexure, thickening of the cecum, nonspecific haziness of the mesenteric fat in the pelvis, mild prominence of lymph nodes at the greater curvature of the stomach ? Treatment with FOLFOX beginning July 2019 ? CTs October 2019-stable disease, FOLFOX continued ? CTs December 2019-stable disease ? January 2020 treatment transition to Xeloda maintenance, care transition to [place] ? CTs in June 2020 in September 2020-stable disease,  Xeloda continued ? CTs 06/23/2019-nonspecific thickening of the GE junction, intraluminal bladder mass ? Cystoscopy-metastatic signet ring cell adenocarcinoma ? Cycle 1 day 1 ramucirumab/paclitaxel 08/19/2019(given atanother facility) ? Cycle 1 day 1  ramucirumab/paclitaxel 09/04/2019 ? Cycle 1 day 8 Taxol 09/10/19 ? Cycle 1 day 15 ramucirumab/paclitaxel 6/2/2021canceled d/t neutropenia ? Ramucirumab/pactlitaxelevery 2 weeks 09/24/2019 ? Ramucirumab/paclitaxel 10/08/2019 ? Ramucirumab/paclitaxel 10/22/2019 ? Ramucirumab/paclitaxel 11/05/2019 ? CTs 11/24/2019-unchanged thickening of the distal esophagus/upper stomach, gastric body and antrum. Unchanged thickening of the transverse colon at the hepatic flexure, unchanged thickening of the urinary bladder, no evidence of progressive disease ? Ramucirumab/paclitaxel 11/25/2019 ? Ramucirumab/paclitaxel 12/09/2019 ? Ramucirumab/paclitaxel 12/23/2019 ? Ramucirumab/paclitaxel9/21/2021 ? Ramucirumab/paclitaxel 01/20/2020 ? Ramucirumab/paclitaxel 02/04/2020 ? Ramucirumab/paclitaxel 02/17/2020 ? Ramucirumab/paclitaxel 03/02/2020 ? Ramucirumab/paclitaxel 03/16/2020 ? CTs 03/28/2020-stable thickening of the distal esophagus, GE junction, gastric body, and antrum. Decreased soft tissue thickening of the transverse colon at the hepatic flexure, no evidence of metastatic disease to the chest ? Ramucirumab/paclitaxel 03/30/2020 ? SBRT to the stomach 04/12/2020-05/06/2020, 37.5 Gray in 15 fractions ? Ramucirumab/paclitaxel 04/05/2020 ? Ramucirumab/paclitaxel 04/13/2020 ? Ramucirumab/paclitaxel 04/27/2020 ? Ramucirumab/paclitaxel 05/25/2020 ? Ramucirumab/paclitaxel 06/11/2020 ? Ramucirumab/paclitaxel 06/22/2020  2.Left breast cancer 2008,pT1c,pN0, status post a left lumpectomy with adjuvant chemotherapy and radiation, ER positive, PR positive, HER-2 positive  3.Mixed connective tissue disease/SLE  4.Lower extremity deep vein thrombosis maintained on apixaban  5.Family history of multiple cancers including breast and ovarian cancer  6.Dysphagia and intermittent vomiting secondary to #1  7.Hypertension  8.Peripheral neuropathy  9.Masslike fullness at the posterior right parotid/angle  of the jaw  10. Dyspnea on exertion, ongoing for about a month, worsened after taxol/ramucirumab on 09/04/19 requiring ED visit on 5/23. CBC, CMP, troponin, BNP and chest xray negative  11.Anemia likely secondary to combination of chemotherapy and blood loss  2 units packed red blood cells 04/12/2020 12.  Admission 05/11/2020 with pneumonia, CT chest revealed left upper and lower lobe opacities     Disposition: [name] appears stable.  The plan is to continue Taxol/ramucirumab.  She will complete another cycle today.  Mild neutropenia today.  She has been treated with a neutrophil count at this level in the past.  She will call for a fever.  She will call for recurrent syncope or increased hematuria.  She is scheduled for an office visit and chemotherapy in 2 weeks.  We will plan for a restaging CT within the next 1-2 months.  Betsy Coder, MD  06/22/2020  10:44 AM

## 2020-06-22 NOTE — Patient Instructions (Signed)
[facility] Discharge Instructions for Patients Receiving Chemotherapy  Today you received the following chemotherapy agents: Cyramza, Taxol  To help prevent nausea and vomiting after your treatment, we encourage you to take your nausea medication as directed.   If you develop nausea and vomiting that is not controlled by your nausea medication, call the clinic.   BELOW ARE SYMPTOMS THAT SHOULD BE REPORTED IMMEDIATELY:  *FEVER GREATER THAN 100.5 F  *CHILLS WITH OR WITHOUT FEVER  NAUSEA AND VOMITING THAT IS NOT CONTROLLED WITH YOUR NAUSEA MEDICATION  *UNUSUAL SHORTNESS OF BREATH  *UNUSUAL BRUISING OR BLEEDING  TENDERNESS IN MOUTH AND THROAT WITH OR WITHOUT PRESENCE OF ULCERS  *URINARY PROBLEMS  *BOWEL PROBLEMS  UNUSUAL RASH Items with * indicate a potential emergency and should be followed up as soon as possible.  Feel free to call the clinic should you have any questions or concerns. The clinic phone number is [phone].  Please show the [facility] at check-in to the Emergency Department and triage nurse.

## 2020-07-03 ENCOUNTER — Other Ambulatory Visit: Payer: Self-pay | Admitting: Oncology

## 2020-07-06 ENCOUNTER — Inpatient Hospital Stay: Payer: Medicare Other

## 2020-07-06 ENCOUNTER — Encounter: Payer: Self-pay | Admitting: Nurse Practitioner

## 2020-07-06 ENCOUNTER — Other Ambulatory Visit: Payer: Self-pay

## 2020-07-06 ENCOUNTER — Inpatient Hospital Stay (HOSPITAL_BASED_OUTPATIENT_CLINIC_OR_DEPARTMENT_OTHER): Payer: Medicare Other | Admitting: Nurse Practitioner

## 2020-07-06 VITALS — BP 109/75 | HR 112 | Temp 97.0°F | Resp 18 | Ht 64.0 in | Wt 175.2 lb

## 2020-07-06 VITALS — HR 114

## 2020-07-06 DIAGNOSIS — C169 Malignant neoplasm of stomach, unspecified: Secondary | ICD-10-CM

## 2020-07-06 DIAGNOSIS — Z5112 Encounter for antineoplastic immunotherapy: Secondary | ICD-10-CM | POA: Diagnosis not present

## 2020-07-06 DIAGNOSIS — Z95828 Presence of other vascular implants and grafts: Secondary | ICD-10-CM

## 2020-07-06 LAB — CBC WITH DIFFERENTIAL (CANCER CENTER ONLY)
Abs Immature Granulocytes: 0.01 10*3/uL (ref 0.00–0.07)
Basophils Absolute: 0 10*3/uL (ref 0.0–0.1)
Basophils Relative: 0 %
Eosinophils Absolute: 0 10*3/uL (ref 0.0–0.5)
Eosinophils Relative: 0 %
HCT: 26.9 % — ABNORMAL LOW (ref 36.0–46.0)
Hemoglobin: 8 g/dL — ABNORMAL LOW (ref 12.0–15.0)
Immature Granulocytes: 0 %
Lymphocytes Relative: 31 %
Lymphs Abs: 0.9 10*3/uL (ref 0.7–4.0)
MCH: 24.2 pg — ABNORMAL LOW (ref 26.0–34.0)
MCHC: 29.7 g/dL — ABNORMAL LOW (ref 30.0–36.0)
MCV: 81.3 fL (ref 80.0–100.0)
Monocytes Absolute: 0.5 10*3/uL (ref 0.1–1.0)
Monocytes Relative: 16 %
Neutro Abs: 1.6 10*3/uL — ABNORMAL LOW (ref 1.7–7.7)
Neutrophils Relative %: 53 %
Platelet Count: 305 10*3/uL (ref 150–400)
RBC: 3.31 MIL/uL — ABNORMAL LOW (ref 3.87–5.11)
RDW: 21.8 % — ABNORMAL HIGH (ref 11.5–15.5)
WBC Count: 3 10*3/uL — ABNORMAL LOW (ref 4.0–10.5)
nRBC: 0 % (ref 0.0–0.2)

## 2020-07-06 LAB — CMP (CANCER CENTER ONLY)
ALT: 10 U/L (ref 0–44)
AST: 16 U/L (ref 15–41)
Albumin: 3.1 g/dL — ABNORMAL LOW (ref 3.5–5.0)
Alkaline Phosphatase: 75 U/L (ref 38–126)
Anion gap: 9 (ref 5–15)
BUN: 12 mg/dL (ref 6–20)
CO2: 25 mmol/L (ref 22–32)
Calcium: 8.6 mg/dL — ABNORMAL LOW (ref 8.9–10.3)
Chloride: 106 mmol/L (ref 98–111)
Creatinine: 0.69 mg/dL (ref 0.44–1.00)
GFR, Estimated: >60 mL/min (ref >60)
Glucose, Bld: 135 mg/dL — ABNORMAL HIGH (ref 70–99)
Potassium: 3.9 mmol/L (ref 3.5–5.1)
Sodium: 140 mmol/L (ref 135–145)
Total Bilirubin: 0.3 mg/dL (ref 0.3–1.2)
Total Protein: 6.8 g/dL (ref 6.5–8.1)

## 2020-07-06 MED ORDER — SODIUM CHLORIDE 0.9 % IV SOLN
10.0000 mg | Freq: Once | INTRAVENOUS | Status: AC
  Administered 2020-07-06: 10 mg via INTRAVENOUS
  Filled 2020-07-06: qty 10

## 2020-07-06 MED ORDER — DIPHENHYDRAMINE HCL 50 MG/ML IJ SOLN
25.0000 mg | Freq: Once | INTRAMUSCULAR | Status: AC
Start: 2020-07-06 — End: 2020-07-06
  Administered 2020-07-06: 25 mg via INTRAVENOUS

## 2020-07-06 MED ORDER — FAMOTIDINE IN NACL 20-0.9 MG/50ML-% IV SOLN
INTRAVENOUS | Status: AC
  Filled 2020-07-06: qty 50

## 2020-07-06 MED ORDER — SODIUM CHLORIDE 0.9% FLUSH
10.0000 mL | Freq: Once | INTRAVENOUS | Status: AC
  Administered 2020-07-06: 10 mL
  Filled 2020-07-06: qty 10

## 2020-07-06 MED ORDER — SODIUM CHLORIDE 0.9% FLUSH
10.0000 mL | INTRAVENOUS | Status: DC | PRN
  Administered 2020-07-06: 10 mL
  Filled 2020-07-06: qty 10

## 2020-07-06 MED ORDER — SODIUM CHLORIDE 0.9 % IV SOLN
60.0000 mg/m2 | Freq: Once | INTRAVENOUS | Status: AC
  Administered 2020-07-06: 114 mg via INTRAVENOUS
  Filled 2020-07-06: qty 19

## 2020-07-06 MED ORDER — SODIUM CHLORIDE 0.9 % IV SOLN
8.0000 mg/kg | Freq: Once | INTRAVENOUS | Status: AC
  Administered 2020-07-06: 700 mg via INTRAVENOUS
  Filled 2020-07-06: qty 50

## 2020-07-06 MED ORDER — FAMOTIDINE IN NACL 20-0.9 MG/50ML-% IV SOLN
20.0000 mg | Freq: Once | INTRAVENOUS | Status: AC
  Administered 2020-07-06: 20 mg via INTRAVENOUS

## 2020-07-06 MED ORDER — HEPARIN SOD (PORK) LOCK FLUSH 100 UNIT/ML IV SOLN
500.0000 [IU] | Freq: Once | INTRAVENOUS | Status: AC | PRN
  Administered 2020-07-06: 500 [IU]
  Filled 2020-07-06: qty 5

## 2020-07-06 MED ORDER — DIPHENHYDRAMINE HCL 50 MG/ML IJ SOLN
INTRAMUSCULAR | Status: AC
  Filled 2020-07-06: qty 1

## 2020-07-06 MED ORDER — SODIUM CHLORIDE 0.9 % IV SOLN
Freq: Once | INTRAVENOUS | Status: AC
  Filled 2020-07-06: qty 250

## 2020-07-06 NOTE — Patient Instructions (Signed)
[facility] Discharge Instructions for Patients Receiving Chemotherapy  Today you received the following chemotherapy agents: ramucirumab and paclitaxel.  To help prevent nausea and vomiting after your treatment, we encourage you to take your nausea medication as directed.   If you develop nausea and vomiting that is not controlled by your nausea medication, call the clinic.   BELOW ARE SYMPTOMS THAT SHOULD BE REPORTED IMMEDIATELY:  *FEVER GREATER THAN 100.5 F  *CHILLS WITH OR WITHOUT FEVER  NAUSEA AND VOMITING THAT IS NOT CONTROLLED WITH YOUR NAUSEA MEDICATION  *UNUSUAL SHORTNESS OF BREATH  *UNUSUAL BRUISING OR BLEEDING  TENDERNESS IN MOUTH AND THROAT WITH OR WITHOUT PRESENCE OF ULCERS  *URINARY PROBLEMS  *BOWEL PROBLEMS  UNUSUAL RASH Items with * indicate a potential emergency and should be followed up as soon as possible.  Feel free to call the clinic should you have any questions or concerns. The clinic phone number is [phone].  Please show the [facility] at check-in to the Emergency Department and triage nurse.

## 2020-07-06 NOTE — Progress Notes (Signed)
[place] [facility] OFFICE PROGRESS NOTE   Diagnosis: Gastric cancer  INTERVAL HISTORY:   [name] returns as scheduled.  She completed another cycle of Taxol/ramucirumab 06/22/2020.  No increase in baseline intermittent nausea/vomiting following the most recent treatment.  She has noted less episodes in general.  Bowels are moving.  She reports a good appetite.  She continues to have intermittent hematuria.  No other bleeding.  Stable dyspnea on exertion.  No fever or cough.  No areas of new pain.  She relates her pain to lupus.  She denies abdominal pain.  Objective:  Vital signs in last 24 hours:  Blood pressure 109/75, pulse (!) 114, temperature (!) 97 F (36.1 C), temperature source Tympanic, resp. rate 18, height _0  (1.626 m), weight 175 lb 3.2 oz (79.5 kg), SpO2 100 %.    HEENT: No thrush or ulcers. Resp: Lungs clear bilaterally. Cardio: Regular, tachycardic. GI: Abdomen soft and nontender.  No hepatomegaly.  No mass. Vascular: No leg edema.  Calves soft and nontender.  Skin: No rash. Port-A-Cath without erythema.   Lab Results:  Lab Results  Component Value Date   WBC 3.0 (L) 07/06/2020   HGB 8.0 (L) 07/06/2020   HCT 26.9 (L) 07/06/2020   MCV 81.3 07/06/2020   PLT 305 07/06/2020   NEUTROABS 1.6 (L) 07/06/2020    Imaging:  No results found.  Medications: I have reviewed the patient's current medications.  Assessment/Plan: 1. Gastric cancer ? Presenting with dysphagia spring 2019 ? 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 CTs revealed wall thickening and luminal narrowing of the colon at the hepatic flexure and cecum ? PET scan with no evidence of distant  metastatic disease or abnormal uptake other than thickening of the distal esophagus and proximal stomach ? 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 CTs 10/23/2017-diffuse prominence of the gastric wall, especially the antrum, focal wall thickening of the distal ascending colon and hepatic flexure, thickening of the cecum, nonspecific haziness of the mesenteric fat in the pelvis, mild prominence of lymph nodes at the greater curvature of the stomach ? Treatment with FOLFOX beginning July 2019 ? CTs October 2019-stable disease, FOLFOX continued ? CTs December 2019-stable disease ? January 2020 treatment transition to Xeloda maintenance, care transition to [place] ? CTs in June 2020 in September 2020-stable disease, Xeloda continued ? CTs 06/23/2019-nonspecific thickening of the GE junction, intraluminal bladder mass ? Cystoscopy-metastatic signet ring cell adenocarcinoma ? Cycle 1 day 1 ramucirumab/paclitaxel 08/19/2019(given atanother facility) ? Cycle 1 day 1 ramucirumab/paclitaxel 09/04/2019 ? Cycle 1 day 8 Taxol 09/10/19 ? Cycle 1 day 15 ramucirumab/paclitaxel 6/2/2021canceled d/t neutropenia ? Ramucirumab/pactlitaxelevery 2 weeks 09/24/2019 ? Ramucirumab/paclitaxel 10/08/2019 ? Ramucirumab/paclitaxel 10/22/2019 ? Ramucirumab/paclitaxel 11/05/2019 ? CTs 11/24/2019-unchanged thickening of the distal esophagus/upper stomach, gastric body and antrum. Unchanged thickening of the transverse colon at the hepatic flexure, unchanged thickening of the urinary bladder, no evidence of progressive disease ? Ramucirumab/paclitaxel  11/25/2019 ? Ramucirumab/paclitaxel 12/09/2019 ? Ramucirumab/paclitaxel 12/23/2019 ? Ramucirumab/paclitaxel9/21/2021 ? Ramucirumab/paclitaxel  01/20/2020 ? Ramucirumab/paclitaxel 02/04/2020 ? Ramucirumab/paclitaxel 02/17/2020 ? Ramucirumab/paclitaxel 03/02/2020 ? Ramucirumab/paclitaxel 03/16/2020 ? CTs 03/28/2020-stable thickening of the distal esophagus, GE junction, gastric body, and antrum. Decreased soft tissue thickening of the transverse colon at the hepatic flexure, no evidence of metastatic disease to the chest ? Ramucirumab/paclitaxel 03/30/2020 ? SBRT to the stomach 04/12/2020-05/06/2020, 37.5 Gray in 15 fractions ? Ramucirumab/paclitaxel 04/05/2020 ? Ramucirumab/paclitaxel 04/13/2020 ? Ramucirumab/paclitaxel 04/27/2020 ? Ramucirumab/paclitaxel 05/25/2020 ? Ramucirumab/paclitaxel 06/11/2020 ? Ramucirumab/paclitaxel 06/22/2020 ? Ramucirumab/paclitaxel 07/06/2020  2.Left breast cancer 2008,pT1c,pN0, status post a left lumpectomy with adjuvant chemotherapy and radiation, ER positive, PR positive, HER-2 positive  3.Mixed connective tissue disease/SLE  4.Lower extremity deep vein thrombosis maintained on apixaban  5.Family history of multiple cancers including breast and ovarian cancer  6.Dysphagia and intermittent vomiting secondary to #1  7.Hypertension  8.Peripheral neuropathy  9.Masslike fullness at the posterior right parotid/angle of the jaw  10. Dyspnea on exertion, ongoing for about a month, worsened after taxol/ramucirumab on 09/04/19 requiring ED visit on 5/23. CBC, CMP, troponin, BNP and chest xray negative  11.Anemia likely secondary to combination of chemotherapy and blood loss  2 units packed red blood cells 04/12/2020 12.Admission 05/11/2020 with pneumonia, CT chest revealed left upper and lower lobe opacities    Disposition: [name] appears stable.  There is no clinical evidence of disease progression.  Plan to continue  ramucirumab/paclitaxel every 2 weeks.  Restaging CTs in 4 to 6 weeks.  We reviewed the CBC from today.  Counts adequate to proceed with treatment.  She has mild neutropenia, improved as compared to 2 weeks ago.  She will contact the office with fever.  She has progressive anemia.  She appears asymptomatic.  Signs/symptoms suggestive of progressive anemia reviewed with her today.  She understands to contact the office should she develop any of these.  She will return for lab, follow-up, treatment in 2 weeks.  She will contact the office in the interim with any problems.   Ned Card ANP/GNP-BC   07/06/2020  9:49 AM

## 2020-07-06 NOTE — Progress Notes (Signed)
Per Ned Card, NP ok to proceed with treatment with HR 112.

## 2020-07-06 NOTE — Progress Notes (Signed)
Nutrition Follow-up:  Patient with gastric cancer, receiving chemotherapy and radiation.  Patient receiving taxol/ramucirumab.   Met with patient during infusion.  Patient reports that she was able to eat bagel with cheese and bacon this am.  Reports that typically gets up and gets grandchild ready for school bus then goes back to bed and gets up later in the morning.  Eats around noon and evening meal.  Drinks 2-3 Boost Breeze shakes daily.  Last night at turkey wing, cooked greens and rice and gravy.    Reports some nausea but better    Medications: reviewed  Labs: reviewed  Anthropometrics:   Weight 175 lb 3.2 oz today decreased from 178 lb 9.6 oz on 3/8.  178 lb  On 2/9 188 lb on 1/11 199 lb on 11/30    NUTRITION DIAGNOSIS: Unintentional weight loss continues   INTERVENTION:  Patient willing to try and eat something for breakfast when gets up with grandson and noon time meal.  Continue boost breeze shakes 2-3 times per day. If weight continues to decrease recommend trial of appetite stimulant     MONITORING, EVALUATION, GOAL: weight trends, intake   NEXT VISIT: to be determined   B. , RD, LDN Registered Dietitian [phone] (mobile)     

## 2020-07-07 ENCOUNTER — Telehealth: Payer: Self-pay | Admitting: Oncology

## 2020-07-07 ENCOUNTER — Telehealth: Payer: Self-pay | Admitting: Nutrition

## 2020-07-07 NOTE — Telephone Encounter (Signed)
Scheduled appt per 3/22 los - pt to get an updated schedule next visit.   

## 2020-07-07 NOTE — Telephone Encounter (Signed)
Scheduled appt per 3/22 sch msg. Pt aware.  

## 2020-07-17 ENCOUNTER — Other Ambulatory Visit: Payer: Self-pay | Admitting: Oncology

## 2020-07-20 ENCOUNTER — Ambulatory Visit: Payer: Medicare Other

## 2020-07-20 ENCOUNTER — Inpatient Hospital Stay: Payer: Medicare Other | Attending: Oncology

## 2020-07-20 ENCOUNTER — Telehealth: Payer: Self-pay | Admitting: Oncology

## 2020-07-20 ENCOUNTER — Inpatient Hospital Stay (HOSPITAL_BASED_OUTPATIENT_CLINIC_OR_DEPARTMENT_OTHER): Payer: Medicare Other | Admitting: Oncology

## 2020-07-20 ENCOUNTER — Other Ambulatory Visit: Payer: Medicare Other

## 2020-07-20 ENCOUNTER — Inpatient Hospital Stay: Payer: Medicare Other

## 2020-07-20 ENCOUNTER — Other Ambulatory Visit: Payer: Self-pay

## 2020-07-20 VITALS — HR 115

## 2020-07-20 VITALS — BP 95/71 | HR 121 | Temp 98.1°F | Resp 18 | Ht 64.0 in | Wt 172.2 lb

## 2020-07-20 DIAGNOSIS — Z5111 Encounter for antineoplastic chemotherapy: Secondary | ICD-10-CM | POA: Diagnosis present

## 2020-07-20 DIAGNOSIS — G629 Polyneuropathy, unspecified: Secondary | ICD-10-CM | POA: Diagnosis not present

## 2020-07-20 DIAGNOSIS — Z5112 Encounter for antineoplastic immunotherapy: Secondary | ICD-10-CM | POA: Diagnosis present

## 2020-07-20 DIAGNOSIS — R109 Unspecified abdominal pain: Secondary | ICD-10-CM | POA: Insufficient documentation

## 2020-07-20 DIAGNOSIS — C169 Malignant neoplasm of stomach, unspecified: Secondary | ICD-10-CM

## 2020-07-20 DIAGNOSIS — D6481 Anemia due to antineoplastic chemotherapy: Secondary | ICD-10-CM | POA: Diagnosis not present

## 2020-07-20 DIAGNOSIS — Z95828 Presence of other vascular implants and grafts: Secondary | ICD-10-CM

## 2020-07-20 DIAGNOSIS — Z853 Personal history of malignant neoplasm of breast: Secondary | ICD-10-CM | POA: Insufficient documentation

## 2020-07-20 LAB — CMP (CANCER CENTER ONLY)
ALT: 6 U/L (ref 0–44)
AST: 12 U/L — ABNORMAL LOW (ref 15–41)
Albumin: 3.7 g/dL (ref 3.5–5.0)
Alkaline Phosphatase: 66 U/L (ref 38–126)
Anion gap: 8 (ref 5–15)
BUN: 14 mg/dL (ref 6–20)
CO2: 25 mmol/L (ref 22–32)
Calcium: 8.6 mg/dL — ABNORMAL LOW (ref 8.9–10.3)
Chloride: 103 mmol/L (ref 98–111)
Creatinine: 0.55 mg/dL (ref 0.44–1.00)
GFR, Estimated: >60 mL/min (ref >60)
Glucose, Bld: 128 mg/dL — ABNORMAL HIGH (ref 70–99)
Potassium: 3.5 mmol/L (ref 3.5–5.1)
Sodium: 136 mmol/L (ref 135–145)
Total Bilirubin: 0.4 mg/dL (ref 0.3–1.2)
Total Protein: 6.9 g/dL (ref 6.5–8.1)

## 2020-07-20 LAB — CBC WITH DIFFERENTIAL (CANCER CENTER ONLY)
Abs Immature Granulocytes: 0.01 10*3/uL (ref 0.00–0.07)
Basophils Absolute: 0 10*3/uL (ref 0.0–0.1)
Basophils Relative: 0 %
Eosinophils Absolute: 0 10*3/uL (ref 0.0–0.5)
Eosinophils Relative: 1 %
HCT: 25.9 % — ABNORMAL LOW (ref 36.0–46.0)
Hemoglobin: 7.8 g/dL — ABNORMAL LOW (ref 12.0–15.0)
Immature Granulocytes: 0 %
Lymphocytes Relative: 31 %
Lymphs Abs: 1 10*3/uL (ref 0.7–4.0)
MCH: 24.2 pg — ABNORMAL LOW (ref 26.0–34.0)
MCHC: 30.1 g/dL (ref 30.0–36.0)
MCV: 80.4 fL (ref 80.0–100.0)
Monocytes Absolute: 0.6 10*3/uL (ref 0.1–1.0)
Monocytes Relative: 18 %
Neutro Abs: 1.5 10*3/uL — ABNORMAL LOW (ref 1.7–7.7)
Neutrophils Relative %: 50 %
Platelet Count: 387 10*3/uL (ref 150–400)
RBC: 3.22 MIL/uL — ABNORMAL LOW (ref 3.87–5.11)
RDW: 22.3 % — ABNORMAL HIGH (ref 11.5–15.5)
WBC Count: 3.1 10*3/uL — ABNORMAL LOW (ref 4.0–10.5)
nRBC: 0.6 % — ABNORMAL HIGH (ref 0.0–0.2)

## 2020-07-20 LAB — SAMPLE TO BLOOD BANK

## 2020-07-20 LAB — PREPARE RBC (CROSSMATCH)

## 2020-07-20 MED ORDER — SODIUM CHLORIDE 0.9% FLUSH
10.0000 mL | Freq: Once | INTRAVENOUS | Status: DC
  Filled 2020-07-20: qty 10

## 2020-07-20 MED ORDER — HEPARIN SOD (PORK) LOCK FLUSH 100 UNIT/ML IV SOLN
500.0000 [IU] | Freq: Once | INTRAVENOUS | Status: AC | PRN
  Administered 2020-07-20: 500 [IU]
  Filled 2020-07-20: qty 5

## 2020-07-20 MED ORDER — SODIUM CHLORIDE 0.9 % IV SOLN
60.0000 mg/m2 | Freq: Once | INTRAVENOUS | Status: AC
  Administered 2020-07-20: 114 mg via INTRAVENOUS
  Filled 2020-07-20: qty 19

## 2020-07-20 MED ORDER — SODIUM CHLORIDE 0.9 % IV SOLN
10.0000 mg | Freq: Once | INTRAVENOUS | Status: AC
  Administered 2020-07-20: 10 mg via INTRAVENOUS
  Filled 2020-07-20: qty 10

## 2020-07-20 MED ORDER — SODIUM CHLORIDE 0.9 % IV SOLN
8.0000 mg/kg | Freq: Once | INTRAVENOUS | Status: AC
  Administered 2020-07-20: 700 mg via INTRAVENOUS
  Filled 2020-07-20: qty 20

## 2020-07-20 MED ORDER — SODIUM CHLORIDE 0.9% FLUSH
10.0000 mL | INTRAVENOUS | Status: DC | PRN
  Administered 2020-07-20: 10 mL
  Filled 2020-07-20: qty 10

## 2020-07-20 MED ORDER — DIPHENHYDRAMINE HCL 50 MG/ML IJ SOLN
25.0000 mg | Freq: Once | INTRAMUSCULAR | Status: AC
  Administered 2020-07-20: 25 mg via INTRAVENOUS
  Filled 2020-07-20: qty 1

## 2020-07-20 MED ORDER — SODIUM CHLORIDE 0.9 % IV SOLN
Freq: Once | INTRAVENOUS | Status: AC
  Filled 2020-07-20: qty 250

## 2020-07-20 MED ORDER — FAMOTIDINE IN NACL 20-0.9 MG/50ML-% IV SOLN
20.0000 mg | Freq: Once | INTRAVENOUS | Status: AC
Start: 2020-07-20 — End: 2020-07-20
  Administered 2020-07-20: 20 mg via INTRAVENOUS
  Filled 2020-07-20: qty 50

## 2020-07-20 NOTE — Addendum Note (Signed)
Addended by: Lenox Ponds E on: 07/20/2020 01:29 PM   Modules accepted: Orders, SmartSet

## 2020-07-20 NOTE — Addendum Note (Signed)
Addended by: Lenox Ponds E on: 07/20/2020 11:10 AM   Modules accepted: Orders

## 2020-07-20 NOTE — Progress Notes (Signed)
Per Dr. Benay Spice: okay to treat today with elevated HR. Recheck Vitals after fluids. Okay to treat with Hgb of 7.8, getting blood transfusion tomorrow.

## 2020-07-20 NOTE — Patient Instructions (Signed)
[place] [facility] Discharge Instructions for Patients Receiving Chemotherapy  Today you received the following chemotherapy agents Ramucirumab (CYRAMZA) & Paclitaxel (TAXOL).  To help prevent nausea and vomiting after your treatment, we encourage you to take your nausea medication as prescribed.   If you develop nausea and vomiting that is not controlled by your nausea medication, call the clinic.   BELOW ARE SYMPTOMS THAT SHOULD BE REPORTED IMMEDIATELY:  *FEVER GREATER THAN 100.5 F  *CHILLS WITH OR WITHOUT FEVER  NAUSEA AND VOMITING THAT IS NOT CONTROLLED WITH YOUR NAUSEA MEDICATION  *UNUSUAL SHORTNESS OF BREATH  *UNUSUAL BRUISING OR BLEEDING  TENDERNESS IN MOUTH AND THROAT WITH OR WITHOUT PRESENCE OF ULCERS  *URINARY PROBLEMS  *BOWEL PROBLEMS  UNUSUAL RASH Items with * indicate a potential emergency and should be followed up as soon as possible.  Feel free to call the clinic should you have any questions or concerns at The clinic phone number is [phone].  Please show the [facility] at check-in to the Emergency Department and triage nurse.

## 2020-07-20 NOTE — Progress Notes (Signed)
[facility] OFFICE PROGRESS NOTE   Diagnosis: Gastric cancer  INTERVAL HISTORY:   [name] returns for scheduled visit.  She is here today with her mother.  She completed another treatment with Taxol/ramucirumab on 07/06/2020.  She continues to have intermittent hematuria.  No other bleeding.  No change in peripheral neuropathy symptoms.  She feels "weak "today.  She has exertional dyspnea.  No fever. Nausea and vomiting has improved.  She feels the gastric radiation helped. Objective:  Vital signs in last 24 hours:  Blood pressure 95/71, pulse (!) 121, temperature 98.1 F (36.7 C), temperature source Tympanic, resp. rate 18, height _0  (1.626 m), weight 172 lb 3.2 oz (78.1 kg), SpO2 100 %.    HEENT: No thrush or ulcers Resp: End inspiratory bilateral expiratory wheeze, no respiratory distress Cardio: Regular rhythm, tachycardia GI: Nontender, no hepatosplenomegaly Vascular: No leg edema   Portacath/PICC-without erythema  Lab Results:  Lab Results  Component Value Date   WBC 3.1 (L) 07/20/2020   HGB 7.8 (L) 07/20/2020   HCT 25.9 (L) 07/20/2020   MCV 80.4 07/20/2020   PLT 387 07/20/2020   NEUTROABS 1.5 (L) 07/20/2020    CMP  Lab Results  Component Value Date   NA 140 07/06/2020   K 3.9 07/06/2020   CL 106 07/06/2020   CO2 25 07/06/2020   GLUCOSE 135 (H) 07/06/2020   BUN 12 07/06/2020   CREATININE 0.69 07/06/2020   CALCIUM 8.6 (L) 07/06/2020   PROT 6.8 07/06/2020   ALBUMIN 3.1 (L) 07/06/2020   AST 16 07/06/2020   ALT 10 07/06/2020   ALKPHOS 75 07/06/2020   BILITOT 0.3 07/06/2020   GFRNONAA >60 07/06/2020   GFRAA >60 01/06/2020    Lab Results  Component Value Date   CEA1 3.45 09/04/2019    No results found for: INR  Imaging:  No results found.  Medications: I have reviewed the patient's current medications.   Assessment/Plan: 1. Gastric cancer ? Presenting with dysphagia spring 2019 ? 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 CTs revealed wall thickening and luminal narrowing of the colon at the hepatic flexure and cecum ? PET scan with no evidence of distant metastatic disease or abnormal uptake other than thickening of the distal esophagus and proximal stomach ? 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 CTs 10/23/2017-diffuse prominence of the gastric wall, especially the antrum, focal wall thickening of the distal ascending colon and hepatic flexure, thickening of the cecum, nonspecific haziness of the mesenteric fat in the pelvis, mild prominence of lymph nodes at the greater curvature of the stomach ? Treatment with FOLFOX beginning July 2019 ? CTs October 2019-stable disease, FOLFOX continued ? CTs December 2019-stable disease ? January 2020 treatment transition to Xeloda maintenance, care transition to [place] ? CTs in June 2020 in September 2020-stable disease, Xeloda continued ? CTs 06/23/2019-nonspecific thickening of the GE junction, intraluminal bladder mass ? Cystoscopy-metastatic signet ring cell adenocarcinoma ? Cycle 1 day 1 ramucirumab/paclitaxel 08/19/2019(given  atanother facility) ? Cycle 1 day 1 ramucirumab/paclitaxel 09/04/2019 ? Cycle 1 day 8 Taxol 09/10/19 ? Cycle 1 day 15 ramucirumab/paclitaxel 6/2/2021canceled d/t  neutropenia ? Ramucirumab/pactlitaxelevery 2 weeks 09/24/2019 ? Ramucirumab/paclitaxel 10/08/2019 ? Ramucirumab/paclitaxel 10/22/2019 ? Ramucirumab/paclitaxel 11/05/2019 ? CTs 11/24/2019-unchanged thickening of the distal esophagus/upper stomach, gastric body and antrum. Unchanged thickening of the transverse colon at the hepatic flexure, unchanged thickening of the urinary bladder, no evidence of progressive disease ? Ramucirumab/paclitaxel 11/25/2019 ? Ramucirumab/paclitaxel 12/09/2019 ? Ramucirumab/paclitaxel 12/23/2019 ? Ramucirumab/paclitaxel9/21/2021 ? Ramucirumab/paclitaxel 01/20/2020 ? Ramucirumab/paclitaxel 02/04/2020 ? Ramucirumab/paclitaxel 02/17/2020 ? Ramucirumab/paclitaxel 03/02/2020 ? Ramucirumab/paclitaxel 03/16/2020 ? CTs 03/28/2020-stable thickening of the distal esophagus, GE junction, gastric body, and antrum. Decreased soft tissue thickening of the transverse colon at the hepatic flexure, no evidence of metastatic disease to the chest ? Ramucirumab/paclitaxel 03/30/2020 ? SBRT to the stomach 04/12/2020-05/06/2020, 37.5 Gray in 15 fractions ? Ramucirumab/paclitaxel 04/05/2020 ? Ramucirumab/paclitaxel 04/13/2020 ? Ramucirumab/paclitaxel 04/27/2020 ? Ramucirumab/paclitaxel 05/25/2020 ? Ramucirumab/paclitaxel 06/11/2020 ? Ramucirumab/paclitaxel 06/22/2020 ? Ramucirumab/paclitaxel 07/06/2020 ? Ramucirumab/paclitaxel 07/20/2020  2.Left breast cancer 2008,pT1c,pN0, status post a left lumpectomy with adjuvant chemotherapy and radiation, ER positive, PR positive, HER-2 positive  3.Mixed connective tissue disease/SLE  4.Lower extremity deep vein thrombosis maintained on apixaban  5.Family history of multiple cancers including breast and ovarian cancer  6.Dysphagia and intermittent vomiting secondary to #1  7.Hypertension  8.Peripheral neuropathy  9.Masslike fullness at the posterior right parotid/angle of the jaw  10. Dyspnea on exertion, ongoing for about  a month, worsened after taxol/ramucirumab on 09/04/19 requiring ED visit on 5/23. 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  There is no clinical evidence of disease progression.  She will continue treatment with Taxol/ramucirumab.  She will complete another cycle today.  She will undergo a restaging CT prior to a follow-up visit in 2 weeks.  She has anemia secondary to chemotherapy, chronic disease, and bleeding.  I suspect the exertional dyspnea and tachycardia are related to anemia.  She agrees to a red cell transfusion.  We will schedule a transfusion with 2 units of packed red blood cells for tomorrow.    Betsy Coder, MD  07/20/2020  9:32 AM

## 2020-07-20 NOTE — Telephone Encounter (Signed)
Scheduled appt per 4/5 los - gave patient AVS and calender per  [place].

## 2020-07-20 NOTE — Telephone Encounter (Signed)
Ct Scan appointment was scheduled with [facility] and I have printed out calendar for patient and provided her with contrast & written instructions for scan as well.

## 2020-07-20 NOTE — Progress Notes (Signed)
Today's Cyramza dose maintained at 700 mg (right at 10%) Roselind Messier, PharmD

## 2020-07-21 ENCOUNTER — Inpatient Hospital Stay: Payer: Medicare Other

## 2020-07-21 ENCOUNTER — Other Ambulatory Visit: Payer: Self-pay

## 2020-07-21 VITALS — BP 126/71 | HR 85 | Temp 98.7°F | Resp 18

## 2020-07-21 DIAGNOSIS — C169 Malignant neoplasm of stomach, unspecified: Secondary | ICD-10-CM

## 2020-07-21 DIAGNOSIS — Z5112 Encounter for antineoplastic immunotherapy: Secondary | ICD-10-CM | POA: Diagnosis not present

## 2020-07-21 DIAGNOSIS — D649 Anemia, unspecified: Secondary | ICD-10-CM

## 2020-07-21 MED ORDER — SODIUM CHLORIDE 0.9% IV SOLUTION
250.0000 mL | Freq: Once | INTRAVENOUS | Status: AC
  Administered 2020-07-21: 250 mL via INTRAVENOUS
  Filled 2020-07-21: qty 250

## 2020-07-21 MED ORDER — SODIUM CHLORIDE 0.9% FLUSH
10.0000 mL | INTRAVENOUS | Status: AC | PRN
  Administered 2020-07-21: 10 mL
  Filled 2020-07-21: qty 10

## 2020-07-21 MED ORDER — HEPARIN SOD (PORK) LOCK FLUSH 100 UNIT/ML IV SOLN
500.0000 [IU] | Freq: Every day | INTRAVENOUS | Status: AC | PRN
  Administered 2020-07-21: 500 [IU]
  Filled 2020-07-21: qty 5

## 2020-07-21 MED ORDER — DIPHENHYDRAMINE HCL 25 MG PO CAPS
25.0000 mg | ORAL_CAPSULE | Freq: Once | ORAL | Status: AC
  Administered 2020-07-21: 25 mg via ORAL

## 2020-07-21 NOTE — Patient Instructions (Signed)

## 2020-07-22 LAB — TYPE AND SCREEN
ABO/RH(D): A POS
Antibody Screen: NEGATIVE
Unit division: 0
Unit division: 0

## 2020-07-22 LAB — BPAM RBC
Blood Product Expiration Date: 202204242359
Blood Product Expiration Date: 202204252359
ISSUE DATE / TIME: 202204060740
ISSUE DATE / TIME: 202204060740
Unit Type and Rh: 6200
Unit Type and Rh: 6200

## 2020-07-27 ENCOUNTER — Ambulatory Visit (HOSPITAL_BASED_OUTPATIENT_CLINIC_OR_DEPARTMENT_OTHER): Admission: RE | Admit: 2020-07-27 | Payer: Medicare Other | Source: Ambulatory Visit

## 2020-07-27 ENCOUNTER — Inpatient Hospital Stay: Payer: Medicare Other

## 2020-07-28 ENCOUNTER — Other Ambulatory Visit: Payer: Self-pay | Admitting: Oncology

## 2020-07-30 ENCOUNTER — Other Ambulatory Visit: Payer: Self-pay | Admitting: Oncology

## 2020-08-03 ENCOUNTER — Ambulatory Visit (HOSPITAL_BASED_OUTPATIENT_CLINIC_OR_DEPARTMENT_OTHER)
Admission: RE | Admit: 2020-08-03 | Discharge: 2020-08-03 | Disposition: A | Discharge status: Home / Self Care | Payer: Medicare Other | Source: Ambulatory Visit | Attending: Oncology | Admitting: Oncology

## 2020-08-03 ENCOUNTER — Inpatient Hospital Stay (HOSPITAL_BASED_OUTPATIENT_CLINIC_OR_DEPARTMENT_OTHER): Payer: Medicare Other | Admitting: Oncology

## 2020-08-03 ENCOUNTER — Inpatient Hospital Stay: Payer: Medicare Other

## 2020-08-03 ENCOUNTER — Ambulatory Visit (HOSPITAL_BASED_OUTPATIENT_CLINIC_OR_DEPARTMENT_OTHER): Admission: RE | Admit: 2020-08-03 | Payer: Medicare Other | Source: Ambulatory Visit

## 2020-08-03 ENCOUNTER — Other Ambulatory Visit: Payer: Self-pay

## 2020-08-03 ENCOUNTER — Other Ambulatory Visit: Payer: Self-pay | Admitting: *Deleted

## 2020-08-03 VITALS — BP 119/83 | HR 100 | Temp 98.1°F | Resp 18 | Ht 64.0 in | Wt 172.9 lb

## 2020-08-03 DIAGNOSIS — C169 Malignant neoplasm of stomach, unspecified: Secondary | ICD-10-CM

## 2020-08-03 LAB — CBC WITH DIFFERENTIAL (CANCER CENTER ONLY)
Abs Immature Granulocytes: 0.01 10*3/uL (ref 0.00–0.07)
Basophils Absolute: 0 10*3/uL (ref 0.0–0.1)
Basophils Relative: 0 %
Eosinophils Absolute: 0 10*3/uL (ref 0.0–0.5)
Eosinophils Relative: 0 %
HCT: 30.4 % — ABNORMAL LOW (ref 36.0–46.0)
Hemoglobin: 9.5 g/dL — ABNORMAL LOW (ref 12.0–15.0)
Immature Granulocytes: 0 %
Lymphocytes Relative: 24 %
Lymphs Abs: 0.9 10*3/uL (ref 0.7–4.0)
MCH: 26.2 pg (ref 26.0–34.0)
MCHC: 31.3 g/dL (ref 30.0–36.0)
MCV: 83.7 fL (ref 80.0–100.0)
Monocytes Absolute: 0.6 10*3/uL (ref 0.1–1.0)
Monocytes Relative: 17 %
Neutro Abs: 2.2 10*3/uL (ref 1.7–7.7)
Neutrophils Relative %: 59 %
Platelet Count: 305 10*3/uL (ref 150–400)
RBC: 3.63 MIL/uL — ABNORMAL LOW (ref 3.87–5.11)
RDW: 20.8 % — ABNORMAL HIGH (ref 11.5–15.5)
WBC Count: 3.7 10*3/uL — ABNORMAL LOW (ref 4.0–10.5)
nRBC: 0 % (ref 0.0–0.2)

## 2020-08-03 LAB — CMP (CANCER CENTER ONLY)
ALT: 9 U/L (ref 0–44)
AST: 15 U/L (ref 15–41)
Albumin: 3.5 g/dL (ref 3.5–5.0)
Alkaline Phosphatase: 72 U/L (ref 38–126)
Anion gap: 7 (ref 5–15)
BUN: 9 mg/dL (ref 6–20)
CO2: 26 mmol/L (ref 22–32)
Calcium: 8.6 mg/dL — ABNORMAL LOW (ref 8.9–10.3)
Chloride: 103 mmol/L (ref 98–111)
Creatinine: 0.49 mg/dL (ref 0.44–1.00)
GFR, Estimated: >60 mL/min (ref >60)
Glucose, Bld: 120 mg/dL — ABNORMAL HIGH (ref 70–99)
Potassium: 3.6 mmol/L (ref 3.5–5.1)
Sodium: 136 mmol/L (ref 135–145)
Total Bilirubin: 0.4 mg/dL (ref 0.3–1.2)
Total Protein: 6.6 g/dL (ref 6.5–8.1)

## 2020-08-03 MED ORDER — IOHEXOL 300 MG/ML  SOLN
75.0000 mL | Freq: Once | INTRAMUSCULAR | Status: AC | PRN
  Administered 2020-08-03: 75 mL via INTRAVENOUS

## 2020-08-03 MED ORDER — OXYCODONE HCL 5 MG PO TABS
5.0000 mg | ORAL_TABLET | Freq: Once | ORAL | Status: AC
  Administered 2020-08-03: 5 mg via ORAL
  Filled 2020-08-03: qty 1

## 2020-08-03 NOTE — Progress Notes (Signed)
[facility] OFFICE PROGRESS NOTE   Diagnosis: Gastric cancer  INTERVAL HISTORY:   [name] was last treated with Taxol and ramucirumab on 07/20/2020.  She reports stable neuropathy symptoms.  Her energy level improved after receiving a red cell transfusion on 07/21/2020.  Nausea remains improved following the completion of palliative radiation.  She continues to have intermittent hematuria.  She complains of pain at the right upper abdomen since 07/29/2020.  The pain is constant.  The pain is relieved with hydrocodone.  No dyspnea.  Objective:  Vital signs in last 24 hours:  Blood pressure 119/83, pulse 100, temperature 98.1 F (36.7 C), temperature source Tympanic, resp. rate 18, height _0  (1.626 m), weight 172 lb 14.4 oz (78.4 kg), SpO2 99 %.    HEENT: No thrush or ulcers Resp: Lungs clear bilaterally, no respiratory distress Cardio: Regular rhythm, tachycardia GI: No mass, no hepatosplenomegaly, tender in the left subcostal region Vascular: No leg edema    Portacath/PICC-without erythema  Lab Results:  Lab Results  Component Value Date   WBC 3.7 (L) 08/03/2020   HGB 9.5 (L) 08/03/2020   HCT 30.4 (L) 08/03/2020   MCV 83.7 08/03/2020   PLT 305 08/03/2020   NEUTROABS 2.2 08/03/2020    CMP  Lab Results  Component Value Date   NA 136 07/20/2020   K 3.5 07/20/2020   CL 103 07/20/2020   CO2 25 07/20/2020   GLUCOSE 128 (H) 07/20/2020   BUN 14 07/20/2020   CREATININE 0.55 07/20/2020   CALCIUM 8.6 (L) 07/20/2020   PROT 6.9 07/20/2020   ALBUMIN 3.7 07/20/2020   AST 12 (L) 07/20/2020   ALT 6 07/20/2020   ALKPHOS 66 07/20/2020   BILITOT 0.4 07/20/2020   GFRNONAA >60 07/20/2020   GFRAA >60 01/06/2020    Lab Results  Component Value Date   CEA1 3.45 09/04/2019    Medications: I have reviewed the patient's current medications.   Assessment/Plan: 1. Gastric cancer ? Presenting with dysphagia spring 2019 ? 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 CTs revealed wall thickening and luminal narrowing of the colon at the hepatic flexure and cecum ? PET scan with no evidence of distant metastatic disease or abnormal uptake other than thickening of the distal esophagus and proximal stomach ? Colonoscopy 10/05/2017 at [facility] revealed multiple foci of thickening/masses at the cecum, hepatic flexure, and distal rectum, biopsy from the cecum and hepatic flexure revealed metastatic adenocarcinoma of gastric origin, 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, PD-L1 combined positive score 2 on hepatic flexure biopsy ? CTs 10/23/2017-diffuse prominence of the gastric wall, especially the antrum, focal wall thickening of the distal ascending colon and hepatic flexure, thickening of the cecum, nonspecific haziness of the mesenteric fat in the pelvis, mild prominence of lymph nodes at the greater curvature of the stomach ? Treatment with FOLFOX beginning July 2019 ? CTs October 2019-stable disease, FOLFOX continued ? CTs December 2019-stable disease ? January 2020 treatment transition to Xeloda maintenance, care transition to [place] ? CTs in June 2020 in September 2020-stable disease, Xeloda continued ? CTs 06/23/2019-nonspecific thickening of the GE junction, intraluminal bladder mass ? Cystoscopy-metastatic signet ring cell adenocarcinoma ? Cycle 1 day 1 ramucirumab/paclitaxel  08/19/2019(given atanother facility) ? Cycle 1 day 1 ramucirumab/paclitaxel 09/04/2019 ? Cycle 1 day 8 Taxol 09/10/19 ? Cycle 1 day 15 ramucirumab/paclitaxel 6/2/2021canceled d/t  neutropenia ? Ramucirumab/pactlitaxelevery 2 weeks 09/24/2019 ? Ramucirumab/paclitaxel 10/08/2019 ? Ramucirumab/paclitaxel 10/22/2019 ? Ramucirumab/paclitaxel 11/05/2019 ? CTs 11/24/2019-unchanged thickening of the distal esophagus/upper stomach, gastric body and antrum. Unchanged thickening of the transverse colon at the hepatic flexure, unchanged thickening of the urinary bladder, no evidence of progressive disease ? Ramucirumab/paclitaxel 11/25/2019 ? Ramucirumab/paclitaxel 12/09/2019 ? Ramucirumab/paclitaxel 12/23/2019 ? Ramucirumab/paclitaxel9/21/2021 ? Ramucirumab/paclitaxel 01/20/2020 ? Ramucirumab/paclitaxel 02/04/2020 ? Ramucirumab/paclitaxel 02/17/2020 ? Ramucirumab/paclitaxel 03/02/2020 ? Ramucirumab/paclitaxel 03/16/2020 ? CTs 03/28/2020-stable thickening of the distal esophagus, GE junction, gastric body, and antrum. Decreased soft tissue thickening of the transverse colon at the hepatic flexure, no evidence of metastatic disease to the chest ? Ramucirumab/paclitaxel 03/30/2020 ? SBRT to the stomach 04/12/2020-05/06/2020, 37.5 Gray in 15 fractions ? Ramucirumab/paclitaxel 04/05/2020 ? Ramucirumab/paclitaxel 04/13/2020 ? Ramucirumab/paclitaxel 04/27/2020 ? Ramucirumab/paclitaxel 05/25/2020 ? Ramucirumab/paclitaxel 06/11/2020 ? Ramucirumab/paclitaxel 06/22/2020 ? Ramucirumab/paclitaxel 07/06/2020 ? Ramucirumab/paclitaxel 07/20/2020  2.Left breast cancer 2008,pT1c,pN0, status post a left lumpectomy with adjuvant chemotherapy and radiation, ER positive, PR positive, HER-2 positive  3.Mixed connective tissue disease/SLE  4.Lower extremity deep vein thrombosis maintained on apixaban  5.Family history of multiple cancers including breast and ovarian cancer  6.Dysphagia and intermittent vomiting secondary to #1  7.Hypertension  8.Peripheral neuropathy  9.Masslike fullness at the posterior right parotid/angle of the jaw  10. Dyspnea on exertion, ongoing for about  a month, worsened after taxol/ramucirumab on 09/04/19 requiring ED visit on 5/23. 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  She has developed increased pain in the left upper abdomen/costal margin.  The etiology of the pain is unclear.  Chemotherapy will be held today.  She will be referred for a CT abdomen/pelvis for within the next 1-2 days.  She will return for an office visit tomorrow.  She will continue hydrocodone as needed for pain.  Betsy Coder, MD  08/03/2020  8:55 AM

## 2020-08-03 NOTE — Progress Notes (Signed)
Scheduled for CT scan at 3:15/3:30 today at [facility] radiology. Instructed her NPO after 11:00 and drink contrast (provided) at 1:30 pm and 2:30 pm. D/C'd current port needle and changed to needle for CT scan. She will return to [facility] after scan to be de-accessed. Mother present for instructions as well.

## 2020-08-04 ENCOUNTER — Telehealth: Payer: Self-pay | Admitting: Oncology

## 2020-08-04 ENCOUNTER — Other Ambulatory Visit (HOSPITAL_BASED_OUTPATIENT_CLINIC_OR_DEPARTMENT_OTHER): Payer: Medicare Other

## 2020-08-04 ENCOUNTER — Inpatient Hospital Stay: Payer: Medicare Other | Admitting: Nutrition

## 2020-08-04 ENCOUNTER — Telehealth: Payer: Self-pay | Admitting: Nutrition

## 2020-08-04 ENCOUNTER — Inpatient Hospital Stay (HOSPITAL_BASED_OUTPATIENT_CLINIC_OR_DEPARTMENT_OTHER): Payer: Medicare Other | Admitting: Oncology

## 2020-08-04 VITALS — BP 125/76 | HR 111 | Temp 98.4°F | Resp 20 | Wt 173.0 lb

## 2020-08-04 DIAGNOSIS — C169 Malignant neoplasm of stomach, unspecified: Secondary | ICD-10-CM | POA: Diagnosis not present

## 2020-08-04 DIAGNOSIS — Z5112 Encounter for antineoplastic immunotherapy: Secondary | ICD-10-CM | POA: Diagnosis not present

## 2020-08-04 MED ORDER — OXYCODONE-ACETAMINOPHEN 5-325 MG PO TABS: 1.0000 | ORAL_TABLET | Freq: Four times a day (QID) | ORAL | 0 refills | Status: DC | PRN

## 2020-08-04 MED ORDER — OXYCODONE HCL 5 MG PO TABS
5.0000 mg | ORAL_TABLET | Freq: Once | ORAL | Status: AC
  Administered 2020-08-04: 5 mg via ORAL
  Filled 2020-08-04: qty 1

## 2020-08-04 NOTE — Telephone Encounter (Signed)
Appointments scheduled per 4/20 los, Patient was given updated calendar

## 2020-08-04 NOTE — Telephone Encounter (Signed)
Telephone follow-up completed with patient receiving treatment for gastric cancer.  Noted treatment was canceled yesterday secondary to increased abdominal pain.  CT is pending.  Weight was documented as 172.9 pounds April 19 down from 175 pounds March 22.  Patient reports her appetite is okay and she is eating normally for her.  She continues to drink 2 cartons of boost breeze, berry flavor, daily.  Reports vomiting is under control now.  She has less nausea and she believes this is because she no longer receives radiation therapy.  States she sometimes vomits twice after chemotherapy.  Patient denies any questions or concerns at this time.  Nutrition diagnosis: Unintentional weight loss continues.  Intervention: Educated patient to continue strategies for high-calorie, high-protein foods. Continue boost breeze 2-3 cartons daily. Provided support and encouragement.  Monitoring, evaluation, goals: Patient will increase calories and protein for weight stabilization.  Next visit: To be scheduled as needed.  **Disclaimer: This note was dictated with voice recognition software. Similar sounding words can inadvertently be transcribed and this note may contain transcription errors which may not have been corrected upon publication of note.**

## 2020-08-04 NOTE — Progress Notes (Signed)
[facility] OFFICE PROGRESS NOTE   Diagnosis: Gastric cancer  INTERVAL HISTORY:   [name] returns as scheduled.  She continues to have pain in the left upper abdomen.  Hydrocodone relieves the pain partially, but she had significant pain relief after taking oxycodone yesterday.  No new complaint.  Nausea remains improved.  Objective:  Vital signs in last 24 hours:  Blood pressure 125/76, pulse (!) 111, temperature 98.4 F (36.9 C), temperature source Oral, resp. rate 20, weight 173 lb (78.5 kg), SpO2 100 %.    Resp: Lungs clear bilaterally Cardio: Regular rate and rhythm, tachycardia GI: No hepatosplenomegaly, tender in the left upper abdomen, no mass Vascular: No leg edema Musculoskeletal: No tenderness over the left lower anterior chest wall  Portacath/PICC-without erythema  Lab Results:  Lab Results  Component Value Date   WBC 3.7 (L) 08/03/2020   HGB 9.5 (L) 08/03/2020   HCT 30.4 (L) 08/03/2020   MCV 83.7 08/03/2020   PLT 305 08/03/2020   NEUTROABS 2.2 08/03/2020    CMP  Lab Results  Component Value Date   NA 136 08/03/2020   K 3.6 08/03/2020   CL 103 08/03/2020   CO2 26 08/03/2020   GLUCOSE 120 (H) 08/03/2020   BUN 9 08/03/2020   CREATININE 0.49 08/03/2020   CALCIUM 8.6 (L) 08/03/2020   PROT 6.6 08/03/2020   ALBUMIN 3.5 08/03/2020   AST 15 08/03/2020   ALT 9 08/03/2020   ALKPHOS 72 08/03/2020   BILITOT 0.4 08/03/2020   GFRNONAA >60 08/03/2020   GFRAA >60 01/06/2020     Imaging:  CT Abdomen Pelvis W Contrast  Result Date: 08/03/2020 CLINICAL DATA:  Restaging gastric cancer. Lupus. Left upper quadrant pain and intermittent hematuria. Current chemotherapy. EXAM: CT ABDOMEN AND PELVIS WITH CONTRAST TECHNIQUE: Multidetector CT imaging of the abdomen and pelvis was performed using the standard protocol following bolus administration of intravenous contrast. CONTRAST:  37m OMNIPAQUE IOHEXOL 300 MG/ML  SOLN COMPARISON:  03/26/2020  FINDINGS: Lower chest: Abnormal circumferential wall thickening in the distal esophagus. Hepatobiliary: Cholecystectomy. Stable bandlike density along the gallbladder fossa and inferior right hepatic lobe margin. Pancreas: Unremarkable Spleen: Punctate calcification in the spleen suggesting remote granulomatous disease. Adrenals/Urinary Tract: Both adrenal glands appear normal. Punctate parenchymal calcification along the right kidney upper pole is unchanged. 1.0 cm cyst of the right kidney upper pole. The ureters appear unremarkable. There is continued abnormal wall thickening along the left upper urinary bladder measuring up to 1.3 cm in thickness on image 55 series 5, previously the same on 03/26/2020. Stomach/Bowel: Substantial abnormal wall thickening in the gastric body and antrum diffusely, worsened compared to 03/26/2020. There continues to be indistinct stranding around the descending duodenum and tracking along adjacent portions of the porta hepatis and retroperitoneum, similar to the prior exam. Vascular/Lymphatic: No pathologic adenopathy identified. Reproductive: Unremarkable Other: Small amount of free pelvic fluid. Trace presacral edema similar to the prior exam. Musculoskeletal: Degenerative facet arthropathy at L4-5 and L5-S1. IMPRESSION: 1. There is substantial distal esophageal circumferential wall thickening as well as wall thickening in the stomach body and antrum. Components of this could be inflammatory but tumor is not excluded. 2. There is some continued indistinct stranding around the descending duodenum and adjacent portions of the retroperitoneum and porta hepatis possibly inflammatory or from prior radiation therapy. 3. Small amount of pelvic ascites. 4. Abnormal wall thickening along the left upper urinary bladder similar to the prior exam. Transitional cell carcinoma/bladder tumor not excluded, but not significantly  progressive. Electronically Signed   By: Van Clines M.D.   On:  08/03/2020 16:26    Medications: I have reviewed the patient's current medications.   Assessment/Plan: 1. Gastric cancer ? Presenting with dysphagia spring 2019 ? 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 CTs revealed wall thickening and luminal narrowing of the colon at the hepatic flexure and cecum ? PET scan with no evidence of distant metastatic disease or abnormal uptake other than thickening of the distal esophagus and proximal stomach ? 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 CTs 10/23/2017-diffuse prominence of the gastric wall, especially the antrum, focal wall thickening of the distal ascending colon and hepatic flexure, thickening of the cecum, nonspecific haziness of the mesenteric fat in the pelvis, mild prominence of lymph nodes at the greater curvature of the stomach ? Treatment with FOLFOX beginning July 2019 ? CTs October 2019-stable disease, FOLFOX continued ? CTs December 2019-stable disease ? January 2020 treatment transition to Xeloda maintenance, care transition to [facility] ? CTs in June 2020 in September 2020-stable disease, Xeloda continued ? CTs 06/23/2019-nonspecific thickening of the GE junction, intraluminal bladder mass ? Cystoscopy-metastatic signet ring cell adenocarcinoma ? Cycle 1 day 1  ramucirumab/paclitaxel 08/19/2019(given atanother facility) ? Cycle 1 day 1 ramucirumab/paclitaxel 09/04/2019 ? Cycle 1 day 8 Taxol 09/10/19 ? Cycle 1 day 15 ramucirumab/paclitaxel 6/2/2021canceled d/t neutropenia ? Ramucirumab/pactlitaxelevery 2 weeks 09/24/2019 ? Ramucirumab/paclitaxel 10/08/2019 ? Ramucirumab/paclitaxel 10/22/2019 ? Ramucirumab/paclitaxel 11/05/2019 ? CTs 11/24/2019-unchanged thickening of the distal esophagus/upper stomach, gastric body and antrum. Unchanged thickening of the transverse colon at the hepatic flexure, unchanged thickening of the urinary bladder, no evidence of progressive disease ? Ramucirumab/paclitaxel 11/25/2019 ? Ramucirumab/paclitaxel 12/09/2019 ? Ramucirumab/paclitaxel 12/23/2019 ? Ramucirumab/paclitaxel9/21/2021 ? Ramucirumab/paclitaxel 01/20/2020 ? Ramucirumab/paclitaxel 02/04/2020 ? Ramucirumab/paclitaxel 02/17/2020 ? Ramucirumab/paclitaxel 03/02/2020 ? Ramucirumab/paclitaxel 03/16/2020 ? CTs 03/28/2020-stable thickening of the distal esophagus, GE junction, gastric body, and antrum. Decreased soft tissue thickening of the transverse colon at the hepatic flexure, no evidence of metastatic disease to the chest ? Ramucirumab/paclitaxel 03/30/2020 ? SBRT to the stomach 04/12/2020-05/06/2020, 37.5 Gray in 15 fractions ? Ramucirumab/paclitaxel 04/05/2020 ? Ramucirumab/paclitaxel 04/13/2020 ? Ramucirumab/paclitaxel 04/27/2020 ? Ramucirumab/paclitaxel 05/25/2020 ? Ramucirumab/paclitaxel 06/11/2020 ? Ramucirumab/paclitaxel 06/22/2020 ? Ramucirumab/paclitaxel 07/06/2020 ? Ramucirumab/paclitaxel 07/20/2020 ? CT abdomen/pelvis 08/03/2020- wall thickening at the distal esophagus and stomach body/antrum-progressive, indistinct stranding around the descending duodenum, porta hepatis, and retroperitoneum- stable, abnormal wall thickening at the left upper urinary bladder-stable  2.Left breast cancer 2008,pT1c,pN0, status post a left lumpectomy with adjuvant chemotherapy  and radiation, ER positive, PR positive, HER-2 positive  3.Mixed connective tissue disease/SLE  4.Lower extremity deep vein thrombosis maintained on apixaban  5.Family history of multiple cancers including breast and ovarian cancer  6.Dysphagia and intermittent vomiting secondary to #1  7.Hypertension  8.Peripheral neuropathy  9.Masslike fullness at the posterior right parotid/angle of the jaw  10. Dyspnea on exertion, ongoing for about a month, worsened after taxol/ramucirumab on 09/04/19 requiring ED visit on 5/23. CBC, CMP, troponin, BNP and chest xray negative  11.Anemia  likely secondary to combination of chemotherapy and blood loss  2 units packed red blood cells 04/12/2020, 07/20/2020 12.Admission 05/11/2020 with pneumonia, CT chest revealed left upper and lower lobe opacities 13.  Progressive abdominal pain beginning 07/29/2020- etiology unclear, likely related to progression of tumor in the stomach     Disposition: [name] appears unchanged compared to when I saw her yesterday.  I reviewed the CT images with her.  There are no new findings to suggest clear progression of metastatic disease.  There is increased wall thickening at the stomach, potentially related to radiation.  I suspect the pain is related to tumor involving the stomach.  She will use oxycodone as needed for pain.  She will call for increased pain.  We decided to continue Taxol/ramucirumab for now.  I will review the CT images with radiology.  She will return for the next cycle of chemotherapy within the next 1 week.  Betsy Coder, MD  08/04/2020  1:51 PM

## 2020-08-04 NOTE — Progress Notes (Signed)
See telephone note.

## 2020-08-11 ENCOUNTER — Other Ambulatory Visit: Payer: Self-pay

## 2020-08-11 ENCOUNTER — Inpatient Hospital Stay: Payer: Medicare Other

## 2020-08-11 ENCOUNTER — Inpatient Hospital Stay (HOSPITAL_BASED_OUTPATIENT_CLINIC_OR_DEPARTMENT_OTHER): Payer: Medicare Other | Admitting: Oncology

## 2020-08-11 VITALS — HR 113

## 2020-08-11 VITALS — BP 121/80 | HR 115 | Temp 98.0°F | Resp 18 | Ht 64.0 in | Wt 171.0 lb

## 2020-08-11 DIAGNOSIS — Z5112 Encounter for antineoplastic immunotherapy: Secondary | ICD-10-CM | POA: Diagnosis not present

## 2020-08-11 DIAGNOSIS — C169 Malignant neoplasm of stomach, unspecified: Secondary | ICD-10-CM | POA: Diagnosis not present

## 2020-08-11 LAB — CBC WITH DIFFERENTIAL (CANCER CENTER ONLY)
Abs Immature Granulocytes: 0.04 10*3/uL (ref 0.00–0.07)
Basophils Absolute: 0 10*3/uL (ref 0.0–0.1)
Basophils Relative: 0 %
Eosinophils Absolute: 0.1 10*3/uL (ref 0.0–0.5)
Eosinophils Relative: 1 %
HCT: 27.7 % — ABNORMAL LOW (ref 36.0–46.0)
Hemoglobin: 8.3 g/dL — ABNORMAL LOW (ref 12.0–15.0)
Immature Granulocytes: 1 %
Lymphocytes Relative: 15 %
Lymphs Abs: 1.1 10*3/uL (ref 0.7–4.0)
MCH: 26.2 pg (ref 26.0–34.0)
MCHC: 30 g/dL (ref 30.0–36.0)
MCV: 87.4 fL (ref 80.0–100.0)
Monocytes Absolute: 0.5 10*3/uL (ref 0.1–1.0)
Monocytes Relative: 7 %
Neutro Abs: 5.5 10*3/uL (ref 1.7–7.7)
Neutrophils Relative %: 76 %
Platelet Count: 335 10*3/uL (ref 150–400)
RBC: 3.17 MIL/uL — ABNORMAL LOW (ref 3.87–5.11)
RDW: 22.4 % — ABNORMAL HIGH (ref 11.5–15.5)
WBC Count: 7.3 10*3/uL (ref 4.0–10.5)
nRBC: 0.5 % — ABNORMAL HIGH (ref 0.0–0.2)

## 2020-08-11 LAB — CMP (CANCER CENTER ONLY)
ALT: 9 U/L (ref 0–44)
AST: 17 U/L (ref 15–41)
Albumin: 3.4 g/dL — ABNORMAL LOW (ref 3.5–5.0)
Alkaline Phosphatase: 79 U/L (ref 38–126)
Anion gap: 7 (ref 5–15)
BUN: 14 mg/dL (ref 6–20)
CO2: 26 mmol/L (ref 22–32)
Calcium: 8.6 mg/dL — ABNORMAL LOW (ref 8.9–10.3)
Chloride: 105 mmol/L (ref 98–111)
Creatinine: 0.47 mg/dL (ref 0.44–1.00)
GFR, Estimated: >60 mL/min (ref >60)
Glucose, Bld: 179 mg/dL — ABNORMAL HIGH (ref 70–99)
Potassium: 4 mmol/L (ref 3.5–5.1)
Sodium: 138 mmol/L (ref 135–145)
Total Bilirubin: 0.3 mg/dL (ref 0.3–1.2)
Total Protein: 6.3 g/dL — ABNORMAL LOW (ref 6.5–8.1)

## 2020-08-11 MED ORDER — DIPHENHYDRAMINE HCL 50 MG/ML IJ SOLN
25.0000 mg | Freq: Once | INTRAMUSCULAR | Status: AC
  Administered 2020-08-11: 25 mg via INTRAVENOUS
  Filled 2020-08-11: qty 1

## 2020-08-11 MED ORDER — FAMOTIDINE IN NACL 20-0.9 MG/50ML-% IV SOLN
20.0000 mg | Freq: Once | INTRAVENOUS | Status: AC
Start: 2020-08-11 — End: 2020-08-11
  Administered 2020-08-11: 20 mg via INTRAVENOUS
  Filled 2020-08-11: qty 50

## 2020-08-11 MED ORDER — SODIUM CHLORIDE 0.9 % IV SOLN
Freq: Once | INTRAVENOUS | Status: AC
  Filled 2020-08-11: qty 250

## 2020-08-11 MED ORDER — SODIUM CHLORIDE 0.9 % IV SOLN
8.0000 mg/kg | Freq: Once | INTRAVENOUS | Status: AC
  Administered 2020-08-11: 600 mg via INTRAVENOUS
  Filled 2020-08-11: qty 50

## 2020-08-11 MED ORDER — SODIUM CHLORIDE 0.9% FLUSH
10.0000 mL | INTRAVENOUS | Status: DC | PRN
  Administered 2020-08-11: 10 mL
  Filled 2020-08-11: qty 10

## 2020-08-11 MED ORDER — SODIUM CHLORIDE 0.9 % IV SOLN
60.0000 mg/m2 | Freq: Once | INTRAVENOUS | Status: AC
  Administered 2020-08-11: 114 mg via INTRAVENOUS
  Filled 2020-08-11: qty 19

## 2020-08-11 MED ORDER — HEPARIN SOD (PORK) LOCK FLUSH 100 UNIT/ML IV SOLN
500.0000 [IU] | Freq: Once | INTRAVENOUS | Status: AC | PRN
  Administered 2020-08-11: 500 [IU]
  Filled 2020-08-11: qty 5

## 2020-08-11 MED ORDER — SODIUM CHLORIDE 0.9 % IV SOLN
10.0000 mg | Freq: Once | INTRAVENOUS | Status: AC
  Administered 2020-08-11: 10 mg via INTRAVENOUS
  Filled 2020-08-11: qty 1

## 2020-08-11 NOTE — Patient Instructions (Signed)

## 2020-08-11 NOTE — Progress Notes (Signed)
[name] OFFICE PROGRESS NOTE   Diagnosis: Gastric cancer  INTERVAL HISTORY:   [name] returns as scheduled.  She reports the left upper abdomen pain resolved last week and has returned, though the pain is not as intense.  She takes oxycodone approximately 3 times per day.  Stable neuropathy symptoms.  Stable hematuria.  She had an episode of vomiting last night.  The nausea and vomiting is generally improved.  Objective:  Vital signs in last 24 hours:  Blood pressure 121/80, pulse (!) 115, temperature 98 F (36.7 C), temperature source Tympanic, resp. rate 18, height 5' 4" (1.626 m), weight 171 lb (77.6 kg), SpO2 100 %.    HEENT: No thrush or ulcers Resp: Lungs clear bilaterally Cardio: Regular rate and rhythm GI: Tender in the left upper abdomen, no mass, no hepatomegaly Vascular: No leg edema   Portacath/PICC-without erythema  Lab Results:  Lab Results  Component Value Date   WBC 7.3 08/11/2020   HGB 8.3 (L) 08/11/2020   HCT 27.7 (L) 08/11/2020   MCV 87.4 08/11/2020   PLT 335 08/11/2020   NEUTROABS 5.5 08/11/2020    CMP  Lab Results  Component Value Date   NA 138 08/11/2020   K 4.0 08/11/2020   CL 105 08/11/2020   CO2 26 08/11/2020   GLUCOSE 179 (H) 08/11/2020   BUN 14 08/11/2020   CREATININE 0.47 08/11/2020   CALCIUM 8.6 (L) 08/11/2020   PROT 6.3 (L) 08/11/2020   ALBUMIN 3.4 (L) 08/11/2020   AST 17 08/11/2020   ALT 9 08/11/2020   ALKPHOS 79 08/11/2020   BILITOT 0.3 08/11/2020   GFRNONAA >60 08/11/2020   GFRAA >60 01/06/2020    Lab Results  Component Value Date   CEA1 3.45 09/04/2019     Medications: I have reviewed the patient's current medications.   Assessment/Plan: 1. Gastric cancer ? Presenting with dysphagia spring 2019 ? Upper endoscopy 08/17/2017 revealed gastric and esophageal erosion, biopsies from the distal esophagus, gastric erosion, and random stomach biopsies confirmed poorly differentiated invasive  adenocarcinoma with signet ring cell morphology, no loss of mismatch repair protein expression, HER-2 negative by FISH, GATA3negative, ER negative ? CTs revealed wall thickening and luminal narrowing of the colon at the hepatic flexure and cecum ? PET scan with no evidence of distant metastatic disease or abnormal uptake other than thickening of the distal esophagus and proximal stomach ? 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 CTs 10/23/2017-diffuse prominence of the gastric wall, especially the antrum, focal wall thickening of the distal ascending colon and hepatic flexure, thickening of the cecum, nonspecific haziness of the mesenteric fat in the pelvis, mild prominence of lymph nodes at the greater curvature of the stomach ? Treatment with FOLFOX beginning July 2019 ? CTs October 2019-stable disease, FOLFOX continued ? CTs December 2019-stable disease ? January 2020 treatment transition to Xeloda maintenance, care transition to [facility] ? CTs in June 2020 in September 2020-stable disease, Xeloda continued ? CTs 06/23/2019-nonspecific thickening of the GE junction, intraluminal bladder mass ? Cystoscopy-metastatic signet ring cell adenocarcinoma ? Cycle 1 day 1 ramucirumab/paclitaxel 08/19/2019(given atanother facility) ? Cycle 1 day 1 ramucirumab/paclitaxel 09/04/2019 ? Cycle 1 day 8 Taxol 09/10/19 ?  Cycle 1 day 15 ramucirumab/paclitaxel 6/2/2021canceled d/t neutropenia ? Ramucirumab/pactlitaxelevery 2 weeks 09/24/2019 ? Ramucirumab/paclitaxel 10/08/2019 ? Ramucirumab/paclitaxel 10/22/2019 ? Ramucirumab/paclitaxel 11/05/2019 ? CTs  11/24/2019-unchanged thickening of the distal esophagus/upper stomach, gastric body and antrum. Unchanged thickening of the transverse colon at the hepatic flexure, unchanged thickening of the urinary bladder, no evidence of progressive disease ? Ramucirumab/paclitaxel 11/25/2019 ? Ramucirumab/paclitaxel 12/09/2019 ? Ramucirumab/paclitaxel 12/23/2019 ? Ramucirumab/paclitaxel9/21/2021 ? Ramucirumab/paclitaxel 01/20/2020 ? Ramucirumab/paclitaxel 02/04/2020 ? Ramucirumab/paclitaxel 02/17/2020 ? Ramucirumab/paclitaxel 03/02/2020 ? Ramucirumab/paclitaxel 03/16/2020 ? CTs 03/28/2020-stable thickening of the distal esophagus, GE junction, gastric body, and antrum. Decreased soft tissue thickening of the transverse colon at the hepatic flexure, no evidence of metastatic disease to the chest ? Ramucirumab/paclitaxel 03/30/2020 ? SBRT to the stomach 04/12/2020-05/06/2020, 37.5 Gray in 15 fractions ? Ramucirumab/paclitaxel 04/05/2020 ? Ramucirumab/paclitaxel 04/13/2020 ? Ramucirumab/paclitaxel 04/27/2020 ? Ramucirumab/paclitaxel 05/25/2020 ? Ramucirumab/paclitaxel 06/11/2020 ? Ramucirumab/paclitaxel 06/22/2020 ? Ramucirumab/paclitaxel 07/06/2020 ? Ramucirumab/paclitaxel 07/20/2020 ? CT abdomen/pelvis 08/03/2020- wall thickening at the distal esophagus and stomach body/antrum-progressive, indistinct stranding around the descending duodenum, porta hepatis, and retroperitoneum- stable, abnormal wall thickening at the left upper urinary bladder-stable ? Ramucirumab/paclitaxel 08/11/2020  2.Left breast cancer 2008,pT1c,pN0, status post a left lumpectomy with adjuvant chemotherapy and radiation, ER positive, PR positive, HER-2 positive  3.Mixed connective tissue disease/SLE  4.Lower extremity deep vein thrombosis maintained on apixaban  5.Family history of multiple cancers including breast and ovarian cancer  6.Dysphagia and intermittent vomiting secondary to #1  7.Hypertension  8.Peripheral  neuropathy  9.Masslike fullness at the posterior right parotid/angle of the jaw  10. Dyspnea on exertion, ongoing for about a month, worsened after taxol/ramucirumab on 09/04/19 requiring ED visit on 5/23. CBC, CMP, troponin, BNP and chest xray negative  11.Anemia likely secondary to combination of chemotherapy and blood loss  2 units packed red blood cells 04/12/2020, 07/20/2020 12.Admission 05/11/2020 with pneumonia, CT chest revealed left upper and lower lobe opacities 13.  Progressive abdominal pain beginning 07/29/2020- etiology unclear, likely related to progression of tumor in the stomach       Disposition: Ms.  appears unchanged.  She continues to have anemia and neuropathy symptoms.  She has persistent pain in the left upper abdomen, though this has improved compared to 2 weeks ago.  I discussed treatment options with Ms.  and her mother.  They understand no therapy will be curative.  The plan is to continue the current systemic therapy regimen unless there is clear evidence of disease progression.  She will complete another treatment with Taxol/ramucirumab today.  She will return for an office visit and chemotherapy in 2 weeks.   , MD  08/11/2020  9:27 AM   

## 2020-08-11 NOTE — Patient Instructions (Signed)
Michelle Cain   Discharge Instructions:  Thank you for choosing [facility] to provide your oncology and hematology care.   If you have a lab appointment with the [facility], please go directly to the [facility] and check in at the registration area.   Wear comfortable clothing and clothing appropriate for easy access to any Portacath or PICC line.   We strive to give you quality time with your provider. You may need to reschedule your appointment if you arrive late (15 or more minutes).  Arriving late affects you and other patients whose appointments are after yours.  Also, if you miss three or more appointments without notifying the office, you may be dismissed from the clinic at the provider's discretion.      For prescription refill requests, have your pharmacy contact our office and allow 72 hours for refills to be completed.    Today you received the following chemotherapy and/or immunotherapy agents Ramucirumab (CYRAMZA) & Paclitaxel (TAXOL).     To help prevent nausea and vomiting after your treatment, we encourage you to take your nausea medication as directed.  BELOW ARE SYMPTOMS THAT SHOULD BE REPORTED IMMEDIATELY: . *FEVER GREATER THAN 100.4 F (38 C) OR HIGHER . *CHILLS OR SWEATING . *NAUSEA AND VOMITING THAT IS NOT CONTROLLED WITH YOUR NAUSEA MEDICATION . *UNUSUAL SHORTNESS OF BREATH . *UNUSUAL BRUISING OR BLEEDING . *URINARY PROBLEMS (pain or burning when urinating, or frequent urination) . *BOWEL PROBLEMS (unusual diarrhea, constipation, pain near the anus) . TENDERNESS IN MOUTH AND THROAT WITH OR WITHOUT PRESENCE OF ULCERS (sore throat, sores in mouth, or a toothache) . UNUSUAL RASH, SWELLING OR PAIN  . UNUSUAL VAGINAL DISCHARGE OR ITCHING   Items with * indicate a potential emergency and should be followed up as soon as possible or go to the Emergency Department if any problems should occur.  Please show the CHEMOTHERAPY  ALERT CARD or IMMUNOTHERAPY ALERT CARD at check-in to the Emergency Department and triage nurse.  Should you have questions after your visit or need to cancel or reschedule your appointment, please contact [facility]  Dept: [phone]  and follow the prompts.  Office hours are 8:00 a.m. to 4:30 p.m. Monday - Friday. Please note that voicemails left after 4:00 p.m. may not be returned until the following business day.  We are closed weekends and major holidays. You have access to a nurse at all times for urgent questions. Please call the main number to the clinic Dept: [phone] and follow the prompts.   For any non-urgent questions, you may also contact your provider using MyChart. We now offer e-Visits for anyone 39 and older to request care online for non-urgent symptoms. For details visit [email or web address].   Also download the MyChart app! Go to the app store, search "MyChart", open the app, select [facility], and log in with your MyChart username and password.  Due to Covid, a mask is required upon entering the hospital/clinic. If you do not have a mask, one will be given to you upon arrival. For doctor visits, patients may have 1 support person aged 57 or older with them. For treatment visits, patients cannot have anyone with them due to current Covid guidelines and our immunocompromised population.

## 2020-08-11 NOTE — Progress Notes (Signed)
Informed patient that radiologist informed Dr. Benay Spice she has a high stool burden left colon at area of her pain. Instructed her to take Miralax routinely 17 grams bid and can decrease to daily once having adequate stool. Also begin colace 100 mg bid.

## 2020-08-11 NOTE — Progress Notes (Signed)
Per Dr. Benay Spice: okay to treat with elevated HR of 113.

## 2020-08-16 ENCOUNTER — Inpatient Hospital Stay: Payer: Medicare Other | Attending: Oncology

## 2020-08-16 ENCOUNTER — Ambulatory Visit (HOSPITAL_BASED_OUTPATIENT_CLINIC_OR_DEPARTMENT_OTHER)
Admission: RE | Admit: 2020-08-16 | Discharge: 2020-08-16 | Disposition: A | Discharge status: Home / Self Care | Payer: Medicare Other | Source: Ambulatory Visit | Attending: Oncology | Admitting: Oncology

## 2020-08-16 ENCOUNTER — Other Ambulatory Visit: Payer: Self-pay

## 2020-08-16 ENCOUNTER — Inpatient Hospital Stay (HOSPITAL_BASED_OUTPATIENT_CLINIC_OR_DEPARTMENT_OTHER): Payer: Medicare Other | Admitting: Oncology

## 2020-08-16 ENCOUNTER — Inpatient Hospital Stay: Payer: Medicare Other

## 2020-08-16 VITALS — BP 101/68 | HR 75 | Temp 98.1°F | Resp 20 | Ht 64.0 in | Wt 167.6 lb

## 2020-08-16 DIAGNOSIS — Z452 Encounter for adjustment and management of vascular access device: Secondary | ICD-10-CM | POA: Insufficient documentation

## 2020-08-16 DIAGNOSIS — R0789 Other chest pain: Secondary | ICD-10-CM

## 2020-08-16 DIAGNOSIS — C169 Malignant neoplasm of stomach, unspecified: Secondary | ICD-10-CM

## 2020-08-16 DIAGNOSIS — G629 Polyneuropathy, unspecified: Secondary | ICD-10-CM | POA: Insufficient documentation

## 2020-08-16 DIAGNOSIS — D649 Anemia, unspecified: Secondary | ICD-10-CM | POA: Insufficient documentation

## 2020-08-16 DIAGNOSIS — C7889 Secondary malignant neoplasm of other digestive organs: Secondary | ICD-10-CM | POA: Insufficient documentation

## 2020-08-16 DIAGNOSIS — I10 Essential (primary) hypertension: Secondary | ICD-10-CM | POA: Insufficient documentation

## 2020-08-16 DIAGNOSIS — Z95828 Presence of other vascular implants and grafts: Secondary | ICD-10-CM | POA: Diagnosis not present

## 2020-08-16 DIAGNOSIS — R319 Hematuria, unspecified: Secondary | ICD-10-CM | POA: Insufficient documentation

## 2020-08-16 DIAGNOSIS — K920 Hematemesis: Secondary | ICD-10-CM | POA: Insufficient documentation

## 2020-08-16 DIAGNOSIS — Z853 Personal history of malignant neoplasm of breast: Secondary | ICD-10-CM | POA: Insufficient documentation

## 2020-08-16 LAB — PREPARE RBC (CROSSMATCH)

## 2020-08-16 LAB — CMP (CANCER CENTER ONLY)
ALT: 10 U/L (ref 0–44)
AST: 16 U/L (ref 15–41)
Albumin: 3.4 g/dL — ABNORMAL LOW (ref 3.5–5.0)
Alkaline Phosphatase: 75 U/L (ref 38–126)
Anion gap: 7 (ref 5–15)
BUN: 14 mg/dL (ref 6–20)
CO2: 26 mmol/L (ref 22–32)
Calcium: 8.6 mg/dL — ABNORMAL LOW (ref 8.9–10.3)
Chloride: 102 mmol/L (ref 98–111)
Creatinine: 0.5 mg/dL (ref 0.44–1.00)
GFR, Estimated: >60 mL/min (ref >60)
Glucose, Bld: 159 mg/dL — ABNORMAL HIGH (ref 70–99)
Potassium: 3.6 mmol/L (ref 3.5–5.1)
Sodium: 135 mmol/L (ref 135–145)
Total Bilirubin: 0.6 mg/dL (ref 0.3–1.2)
Total Protein: 6.3 g/dL — ABNORMAL LOW (ref 6.5–8.1)

## 2020-08-16 LAB — CBC WITH DIFFERENTIAL (CANCER CENTER ONLY)
Abs Immature Granulocytes: 0.02 10*3/uL (ref 0.00–0.07)
Basophils Absolute: 0 10*3/uL (ref 0.0–0.1)
Basophils Relative: 0 %
Eosinophils Absolute: 0 10*3/uL (ref 0.0–0.5)
Eosinophils Relative: 0 %
HCT: 26.8 % — ABNORMAL LOW (ref 36.0–46.0)
Hemoglobin: 8.1 g/dL — ABNORMAL LOW (ref 12.0–15.0)
Immature Granulocytes: 0 %
Lymphocytes Relative: 15 %
Lymphs Abs: 1 10*3/uL (ref 0.7–4.0)
MCH: 25.8 pg — ABNORMAL LOW (ref 26.0–34.0)
MCHC: 30.2 g/dL (ref 30.0–36.0)
MCV: 85.4 fL (ref 80.0–100.0)
Monocytes Absolute: 0.1 10*3/uL (ref 0.1–1.0)
Monocytes Relative: 2 %
Neutro Abs: 5.3 10*3/uL (ref 1.7–7.7)
Neutrophils Relative %: 83 %
Platelet Count: 299 10*3/uL (ref 150–400)
RBC: 3.14 MIL/uL — ABNORMAL LOW (ref 3.87–5.11)
RDW: 22.2 % — ABNORMAL HIGH (ref 11.5–15.5)
WBC Count: 6.5 10*3/uL (ref 4.0–10.5)
nRBC: 0.5 % — ABNORMAL HIGH (ref 0.0–0.2)

## 2020-08-16 LAB — SAMPLE TO BLOOD BANK

## 2020-08-16 MED ORDER — IOHEXOL 350 MG/ML SOLN
100.0000 mL | Freq: Once | INTRAVENOUS | Status: AC | PRN
  Administered 2020-08-16: 100 mL via INTRAVENOUS

## 2020-08-16 MED ORDER — SODIUM CHLORIDE 0.9% FLUSH
10.0000 mL | INTRAVENOUS | Status: DC | PRN
  Administered 2020-08-16: 10 mL via INTRAVENOUS
  Filled 2020-08-16: qty 10

## 2020-08-16 NOTE — Progress Notes (Signed)
cbc

## 2020-08-16 NOTE — Progress Notes (Signed)
[facility] OFFICE PROGRESS NOTE   Diagnosis: Gastric cancer  INTERVAL HISTORY:   [name] returns prior to a scheduled visit.  She completed completed another cycle of paclitaxel/ramucirumab.  She presents today with increased dyspnea and left-sided chest discomfort.  The upper abdominal pain has improved.  She complains of pain near the left breast.  She has dyspnea with minimal exertion.  She continues to have intermittent hematuria.  She had nausea and vomiting over the weekend.  Objective:  Vital signs in last 24 hours:  Blood pressure 101/68, pulse 75, temperature 98.1 F (36.7 C), temperature source Oral, resp. rate 20, height _0  (1.626 m), weight 167 lb 9.6 oz (76 kg), SpO2 100 %.    HEENT: No thrush or ulcer Resp: Lungs clear bilaterally, dyspnea with ambulating from the chair to the examination table Cardio: Regular rate and rhythm GI: No hepatosplenomegaly, no mass Vascular: No leg edema Musculoskeletal: Mild tenderness at the left anterior chest wall  Portacath/PICC-without erythema  Lab Results:  Lab Results  Component Value Date   WBC 6.5 08/16/2020   HGB 8.1 (L) 08/16/2020   HCT 26.8 (L) 08/16/2020   MCV 85.4 08/16/2020   PLT 299 08/16/2020   NEUTROABS 5.3 08/16/2020    CMP  Lab Results  Component Value Date   NA 135 08/16/2020   K 3.6 08/16/2020   CL 102 08/16/2020   CO2 26 08/16/2020   GLUCOSE 159 (H) 08/16/2020   BUN 14 08/16/2020   CREATININE 0.50 08/16/2020   CALCIUM 8.6 (L) 08/16/2020   PROT 6.3 (L) 08/16/2020   ALBUMIN 3.4 (L) 08/16/2020   AST 16 08/16/2020   ALT 10 08/16/2020   ALKPHOS 75 08/16/2020   BILITOT 0.6 08/16/2020   GFRNONAA >60 08/16/2020   GFRAA >60 01/06/2020    Lab Results  Component Value Date   CEA1 3.45 09/04/2019    No results found for: INR  Imaging:  CT ANGIO CHEST PE W OR WO CONTRAST  Result Date: 08/16/2020 CLINICAL DATA:  Shortness of breath and chest pain. History of breast,  gastric, and colon cancer. EXAM: CT ANGIOGRAPHY CHEST WITH CONTRAST TECHNIQUE: Multidetector CT imaging of the chest was performed using the standard protocol during bolus administration of intravenous contrast. Multiplanar CT image reconstructions and MIPs were obtained to evaluate the vascular anatomy. CONTRAST:  125m OMNIPAQUE IOHEXOL 350 MG/ML SOLN COMPARISON:  Chest CT May 11, 2020 FINDINGS: Cardiovascular: There is no demonstrable pulmonary embolus. A vessel in the left lower lobe makes an acute turn causing a subtle defect on the axial images which is not confirmed on other images and is not felt to represent pulmonary embolus. There is no thoracic aortic aneurysm or dissection. Visualized great vessels appear unremarkable. Port-A-Cath tip is in the superior vena cava. No evident pericardial effusion or pericardial thickening. Mediastinum/Nodes: Status post left thyroidectomy. Remaining thyroid appears normal. No appreciable adenopathy. There remains circumferential thickening in the distal esophagus which is unchanged from prior study. This area may represent previous treated neoplasm but also may represent esophagitis. The appearance in this area is stable. Lungs/Pleura: There is slight bibasilar atelectasis. No evident edema or airspace opacity. No appreciable pleural effusions. Trachea and major bronchial structures appear patent. No evident pneumothorax. Upper Abdomen: Gallbladder is absent. Occasional presumed calcified granulomas in the spleen. Incomplete visualization of cyst arising from posterior right kidney measuring 0.8 x 0.8 cm. Musculoskeletal: No blastic or lytic bone lesions. Port present on the left anteriorly. No chest wall lesions  appreciable. Review of the MIP images confirms the above findings. IMPRESSION: 1. No demonstrable pulmonary embolus. No thoracic aortic aneurysm or dissection. 2. Circumferential thickening distal esophagus again noted which may represent residua of previous  treated neoplasm but also could represent a degree of esophagitis. 3.  No edema or airspace opacity.  Mild bibasilar atelectasis. 4.  No evident adenopathy. 5. Status post left-sided thyroidectomy. Status post cholecystectomy. 6.  Port-A-Cath tip in superior vena cava near cavoatrial junction. Electronically Signed   By: Lowella Grip III M.D.   On: 08/16/2020 13:31    Medications: I have reviewed the patient's current medications.   Assessment/Plan: 1. Gastric cancer ? Presenting with dysphagia spring 2019 ? 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 CTs revealed wall thickening and luminal narrowing of the colon at the hepatic flexure and cecum ? PET scan with no evidence of distant metastatic disease or abnormal uptake other than thickening of the distal esophagus and proximal stomach ? 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 CTs 10/23/2017-diffuse prominence of the gastric wall, especially the antrum, focal wall thickening of the distal ascending colon and hepatic flexure, thickening of the cecum, nonspecific haziness of the mesenteric fat in the pelvis, mild prominence of lymph nodes at the greater curvature of the stomach ? Treatment with FOLFOX beginning July 2019 ? CTs October 2019-stable disease, FOLFOX continued ? CTs December 2019-stable disease ? January 2020  treatment transition to Xeloda maintenance, care transition to [facility] ? CTs in June 2020 in September 2020-stable disease, Xeloda continued ? CTs 06/23/2019-nonspecific thickening of the GE junction, intraluminal bladder mass ? Cystoscopy-metastatic signet ring cell adenocarcinoma ? Cycle 1 day 1 ramucirumab/paclitaxel 08/19/2019(given atanother facility) ? Cycle 1 day 1 ramucirumab/paclitaxel 09/04/2019 ? Cycle 1 day 8 Taxol 09/10/19 ? Cycle 1 day 15 ramucirumab/paclitaxel 6/2/2021canceled d/t neutropenia ? Ramucirumab/pactlitaxelevery 2 weeks 09/24/2019 ? Ramucirumab/paclitaxel 10/08/2019 ? Ramucirumab/paclitaxel 10/22/2019 ? Ramucirumab/paclitaxel 11/05/2019 ? CTs 11/24/2019-unchanged thickening of the distal esophagus/upper stomach, gastric body and antrum. Unchanged thickening of the transverse colon at the hepatic flexure, unchanged thickening of the urinary bladder, no evidence of progressive disease ? Ramucirumab/paclitaxel 11/25/2019 ? Ramucirumab/paclitaxel 12/09/2019 ? Ramucirumab/paclitaxel 12/23/2019 ? Ramucirumab/paclitaxel9/21/2021 ? Ramucirumab/paclitaxel 01/20/2020 ? Ramucirumab/paclitaxel 02/04/2020 ? Ramucirumab/paclitaxel 02/17/2020 ? Ramucirumab/paclitaxel 03/02/2020 ? Ramucirumab/paclitaxel 03/16/2020 ? CTs 03/28/2020-stable thickening of the distal esophagus, GE junction, gastric body, and antrum. Decreased soft tissue thickening of the transverse colon at the hepatic flexure, no evidence of metastatic disease to the chest ? Ramucirumab/paclitaxel 03/30/2020 ? SBRT to the stomach 04/12/2020-05/06/2020, 37.5 Gray in 15 fractions ? Ramucirumab/paclitaxel 04/05/2020 ? Ramucirumab/paclitaxel 04/13/2020 ? Ramucirumab/paclitaxel 04/27/2020 ? Ramucirumab/paclitaxel 05/25/2020 ? Ramucirumab/paclitaxel 06/11/2020 ? Ramucirumab/paclitaxel 06/22/2020 ? Ramucirumab/paclitaxel 07/06/2020 ? Ramucirumab/paclitaxel 07/20/2020 ? CT abdomen/pelvis 08/03/2020- wall  thickening at the distal esophagus and stomach body/antrum-progressive, indistinct stranding around the descending duodenum, porta hepatis, and retroperitoneum- stable, abnormal wall thickening at the left upper urinary bladder-stable ? 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 Dyspnea on exertion, ongoing for about a month, worsened after taxol/ramucirumab on 09/04/19 requiring ED visit on 5/23. CBC, CMP, troponin, BNP and chest xray negative  11.Anemia likely secondary to combination of chemotherapy and blood loss  2 units packed red blood cells 04/12/2020, 07/20/2020 12.Admission 05/11/2020 with pneumonia, CT chest revealed left upper and lower lobe opacities 13.  Progressive abdominal pain beginning 07/29/2020- etiology unclear, likely related to progression of tumor in the stomach   Disposition: [name] presents today with increased dyspnea and left-sided chest discomfort.  A CT of the chest revealed no evidence for pulmonary embolism.  No evidence of metastatic disease involving the lungs or chest wall.  She has persistent thickening at the distal esophagus.  She is more anemic.  We will arrange for a Red cell transfusion to see if this helps the dyspnea.  She continues anticoagulation therapy for a lower extremity deep vein thrombosis.  She will return for Red cell transfusion on 08/18/2020.  She is scheduled for an office visit on 08/25/2020.  She will call in the  interim for increased dyspnea or new symptoms.  Betsy Coder, MD  08/16/2020  4:19 PM

## 2020-08-17 DIAGNOSIS — Z9289 Personal history of other medical treatment: Secondary | ICD-10-CM

## 2020-08-17 HISTORY — DX: Personal history of other medical treatment: Z92.89

## 2020-08-18 ENCOUNTER — Inpatient Hospital Stay: Payer: Medicare Other

## 2020-08-18 ENCOUNTER — Other Ambulatory Visit: Payer: Self-pay

## 2020-08-18 VITALS — BP 130/87 | HR 97 | Temp 98.2°F | Resp 18

## 2020-08-18 DIAGNOSIS — I10 Essential (primary) hypertension: Secondary | ICD-10-CM | POA: Diagnosis not present

## 2020-08-18 DIAGNOSIS — Z452 Encounter for adjustment and management of vascular access device: Secondary | ICD-10-CM | POA: Diagnosis not present

## 2020-08-18 DIAGNOSIS — K920 Hematemesis: Secondary | ICD-10-CM | POA: Diagnosis not present

## 2020-08-18 DIAGNOSIS — C7889 Secondary malignant neoplasm of other digestive organs: Secondary | ICD-10-CM | POA: Diagnosis not present

## 2020-08-18 DIAGNOSIS — D649 Anemia, unspecified: Secondary | ICD-10-CM | POA: Diagnosis not present

## 2020-08-18 DIAGNOSIS — C169 Malignant neoplasm of stomach, unspecified: Secondary | ICD-10-CM | POA: Diagnosis present

## 2020-08-18 DIAGNOSIS — G629 Polyneuropathy, unspecified: Secondary | ICD-10-CM | POA: Diagnosis not present

## 2020-08-18 DIAGNOSIS — R319 Hematuria, unspecified: Secondary | ICD-10-CM | POA: Diagnosis not present

## 2020-08-18 DIAGNOSIS — Z853 Personal history of malignant neoplasm of breast: Secondary | ICD-10-CM | POA: Diagnosis not present

## 2020-08-18 DIAGNOSIS — Z95828 Presence of other vascular implants and grafts: Secondary | ICD-10-CM

## 2020-08-18 MED ORDER — SODIUM CHLORIDE 0.9% FLUSH
10.0000 mL | Freq: Once | INTRAVENOUS | Status: AC
  Administered 2020-08-18: 10 mL via INTRAVENOUS
  Filled 2020-08-18: qty 10

## 2020-08-18 MED ORDER — HEPARIN SOD (PORK) LOCK FLUSH 100 UNIT/ML IV SOLN
500.0000 [IU] | Freq: Once | INTRAVENOUS | Status: AC
  Administered 2020-08-18: 500 [IU] via INTRAVENOUS
  Filled 2020-08-18: qty 5

## 2020-08-18 MED ORDER — DIPHENHYDRAMINE HCL 25 MG PO CAPS
25.0000 mg | ORAL_CAPSULE | Freq: Once | ORAL | Status: AC
  Administered 2020-08-18: 25 mg via ORAL
  Filled 2020-08-18: qty 1

## 2020-08-18 NOTE — Patient Instructions (Signed)
[facility]  Discharge Instructions: Thank you for choosing [facility] to provide your oncology and hematology care.   If you have a lab appointment with the [facility], please go directly to the [facility] and check in at the registration area.   Wear comfortable clothing and clothing appropriate for easy access to any Portacath or PICC line.   We strive to give you quality time with your provider. You may need to reschedule your appointment if you arrive late (15 or more minutes).  Arriving late affects you and other patients whose appointments are after yours.  Also, if you miss three or more appointments without notifying the office, you may be dismissed from the clinic at the provider's discretion.      For prescription refill requests, have your pharmacy contact our office and allow 72 hours for refills to be completed.    Today you received the following  2 units blood  To help prevent nausea and vomiting after your treatment, we encourage you to take your nausea medication as directed.  BELOW ARE SYMPTOMS THAT SHOULD BE REPORTED IMMEDIATELY: . *FEVER GREATER THAN 100.4 F (38 C) OR HIGHER . *CHILLS OR SWEATING . *NAUSEA AND VOMITING THAT IS NOT CONTROLLED WITH YOUR NAUSEA MEDICATION . *UNUSUAL SHORTNESS OF BREATH . *UNUSUAL BRUISING OR BLEEDING . *URINARY PROBLEMS (pain or burning when urinating, or frequent urination) . *BOWEL PROBLEMS (unusual diarrhea, constipation, pain near the anus) . TENDERNESS IN MOUTH AND THROAT WITH OR WITHOUT PRESENCE OF ULCERS (sore throat, sores in mouth, or a toothache) . UNUSUAL RASH, SWELLING OR PAIN  . UNUSUAL VAGINAL DISCHARGE OR ITCHING   Items with * indicate a potential emergency and should be followed up as soon as possible or go to the Emergency Department if any problems should occur.  Please show the CHEMOTHERAPY ALERT CARD or IMMUNOTHERAPY ALERT CARD at check-in to the Emergency Department  and triage nurse.  Should you have questions after your visit or need to cancel or reschedule your appointment, please contact [facility]  Dept: [phone]  and follow the prompts.  Office hours are 8:00 a.m. to 4:30 p.m. Monday - Friday. Please note that voicemails left after 4:00 p.m. may not be returned until the following business day.  We are closed weekends and major holidays. You have access to a nurse at all times for urgent questions. Please call the main number to the clinic Dept: [phone] and follow the prompts.   For any non-urgent questions, you may also contact your provider using MyChart. We now offer e-Visits for anyone 46 and older to request care online for non-urgent symptoms. For details visit [email or web address].   Also download the MyChart app! Go to the app store, search "MyChart", open the app, select [facility], and log in with your MyChart username and password.  Due to Covid, a mask is required upon entering the hospital/clinic. If you do not have a mask, one will be given to you upon arrival. For doctor visits, patients may have 1 support person aged 48 or older with them. For treatment visits, patients cannot have anyone with them due to current Covid guidelines and our immunocompromised population.     Blood Transfusion, Adult, Care After This sheet gives you information about how to care for yourself after your procedure. Your doctor may also give you more specific instructions. If you have problems or questions, contact your doctor. What can I expect after  the procedure? After the procedure, it is common to have:  Bruising and soreness at the IV site.  A fever or chills on the day of the procedure. This may be your body's response to the new blood cells received.  A headache. Follow these instructions at home: Insertion site care  Follow instructions from your doctor about how to take care of your insertion site. This is  where an IV tube was put into your vein. Make sure you: ? Wash your hands with soap and water before and after you change your bandage (dressing). If you cannot use soap and water, use hand sanitizer. ? Change your bandage as told by your doctor.  Check your insertion site every day for signs of infection. Check for: ? Redness, swelling, or pain. ? Bleeding from the site. ? Warmth. ? Pus or a bad smell.      General instructions  Take over-the-counter and prescription medicines only as told by your doctor.  Rest as told by your doctor.  Go back to your normal activities as told by your doctor.  Keep all follow-up visits as told by your doctor. This is important. Contact a doctor if:  You have itching or red, swollen areas of skin (hives).  You feel worried or nervous (anxious).  You feel weak after doing your normal activities.  You have redness, swelling, warmth, or pain around the insertion site.  You have blood coming from the insertion site, and the blood does not stop with pressure.  You have pus or a bad smell coming from the insertion site. Get help right away if:  You have signs of a serious reaction. This may be coming from an allergy or the body's defense system (immune system). Signs include: ? Trouble breathing or shortness of breath. ? Swelling of the face or feeling warm (flushed). ? Fever or chills. ? Head, chest, or back pain. ? Dark pee (urine) or blood in the pee. ? Widespread rash. ? Fast heartbeat. ? Feeling dizzy or light-headed. You may receive your blood transfusion in an outpatient setting. If so, you will be told whom to contact to report any reactions. These symptoms may be an emergency. Do not wait to see if the symptoms will go away. Get medical help right away. Call your local emergency services (911 in the [place] Do not drive yourself to the hospital. Summary  Bruising and soreness at the IV site are common.  Check your insertion site  every day for signs of infection.  Rest as told by your doctor. Go back to your normal activities as told by your doctor.  Get help right away if you have signs of a serious reaction. This information is not intended to replace advice given to you by your health care provider. Make sure you discuss any questions you have with your health care provider. Document Revised: 09/26/2018 Document Reviewed: 09/26/2018 Elsevier Patient Education  [place].

## 2020-08-19 ENCOUNTER — Inpatient Hospital Stay: Payer: Medicare Other

## 2020-08-19 ENCOUNTER — Telehealth: Payer: Self-pay | Admitting: *Deleted

## 2020-08-19 ENCOUNTER — Encounter: Payer: Self-pay | Admitting: Physician Assistant

## 2020-08-19 ENCOUNTER — Inpatient Hospital Stay (HOSPITAL_BASED_OUTPATIENT_CLINIC_OR_DEPARTMENT_OTHER): Payer: Medicare Other | Admitting: Nurse Practitioner

## 2020-08-19 ENCOUNTER — Encounter: Payer: Self-pay | Admitting: Nurse Practitioner

## 2020-08-19 VITALS — BP 133/76 | HR 115 | Temp 99.6°F | Resp 18 | Ht 64.0 in | Wt 170.0 lb

## 2020-08-19 DIAGNOSIS — C169 Malignant neoplasm of stomach, unspecified: Secondary | ICD-10-CM

## 2020-08-19 DIAGNOSIS — D649 Anemia, unspecified: Secondary | ICD-10-CM

## 2020-08-19 LAB — CBC WITH DIFFERENTIAL (CANCER CENTER ONLY)
Abs Immature Granulocytes: 0.05 10*3/uL (ref 0.00–0.07)
Basophils Absolute: 0 10*3/uL (ref 0.0–0.1)
Basophils Relative: 0 %
Eosinophils Absolute: 0 10*3/uL (ref 0.0–0.5)
Eosinophils Relative: 1 %
HCT: 33.5 % — ABNORMAL LOW (ref 36.0–46.0)
Hemoglobin: 10.8 g/dL — ABNORMAL LOW (ref 12.0–15.0)
Immature Granulocytes: 1 %
Lymphocytes Relative: 21 %
Lymphs Abs: 1 10*3/uL (ref 0.7–4.0)
MCH: 27.1 pg (ref 26.0–34.0)
MCHC: 32.2 g/dL (ref 30.0–36.0)
MCV: 84.2 fL (ref 80.0–100.0)
Monocytes Absolute: 0.3 10*3/uL (ref 0.1–1.0)
Monocytes Relative: 6 %
Neutro Abs: 3.3 10*3/uL (ref 1.7–7.7)
Neutrophils Relative %: 71 %
Platelet Count: 211 10*3/uL (ref 150–400)
RBC: 3.98 MIL/uL (ref 3.87–5.11)
RDW: 19.1 % — ABNORMAL HIGH (ref 11.5–15.5)
WBC Count: 4.6 10*3/uL (ref 4.0–10.5)
nRBC: 2.8 % — ABNORMAL HIGH (ref 0.0–0.2)

## 2020-08-19 LAB — TYPE AND SCREEN
ABO/RH(D): A POS
Antibody Screen: NEGATIVE
Unit division: 0
Unit division: 0

## 2020-08-19 LAB — BPAM RBC
Blood Product Expiration Date: 202205192359
Blood Product Expiration Date: 202205192359
ISSUE DATE / TIME: 202205040751
ISSUE DATE / TIME: 202205040751
Unit Type and Rh: 6200
Unit Type and Rh: 6200

## 2020-08-19 NOTE — Progress Notes (Addendum)
[place] OFFICE PROGRESS NOTE   Diagnosis: Gastric cancer  INTERVAL HISTORY:   [name] returns prior to scheduled follow-up.  She was seen on 08/16/2020 with increased dyspnea and left-sided chest discomfort.  Chest CT showed no evidence for pulmonary embolism.  There was no evidence of metastatic disease involving the lungs or chest wall.  She was more anemic.  She was transfused 2 units of blood yesterday.  She contacted on-call service last night to report an episode of hematemesis.  She was instructed to go to the emergency room but declined to do this.  Our office contacted her this morning to follow-up.  Her mother reported continued shortness of breath and weakness.  [name] reports overall she is feeling better.  She was able to ambulate around the house this morning without shortness of breath.  She woke up during the night last night and vomited bright red blood, estimates about a tablespoon.  No further episodes.  She continues to have left-sided abdominal pain.  Bowels are moving.  No black or maroon stools.  She is eating and drinking today without difficulty.  Objective:  Vital signs in last 24 hours:  Blood pressure 133/76, pulse (!) 115, temperature 99.6 F (37.6 C), temperature source Oral, resp. rate 18, height 5' 4" (1.626 m), weight 170 lb (77.1 kg), SpO2 100 %.    HEENT: No thrush or ulcers. Resp: Lungs clear bilaterally. Cardio: Regular rate and rhythm. GI: Abdomen soft.  Tender at the left upper abdomen.  No mass.  No hepatomegaly. Vascular: No leg edema. Neuro: Alert and oriented. Skin: Warm and dry. Port-A-Cath without erythema.   Lab Results:  Lab Results  Component Value Date   WBC 4.6 08/19/2020   HGB 10.8 (L) 08/19/2020   HCT 33.5 (L) 08/19/2020   MCV 84.2 08/19/2020   PLT 211 08/19/2020   NEUTROABS 3.3 08/19/2020    Imaging:  No results found.  Medications: I have reviewed the patient's current  medications.  Assessment/Plan: 1. Gastric cancer ? Presenting with dysphagia spring 2019 ? 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 CTs revealed wall thickening and luminal narrowing of the colon at the hepatic flexure and cecum ? PET scan with no evidence of distant metastatic disease or abnormal uptake other than thickening of the distal esophagus and proximal stomach ? Colonoscopy 10/05/2017 at [facility] revealed multiple foci of thickening/masses at the cecum, hepatic flexure, and distal rectum, biopsy from the cecum and hepatic flexure revealed metastatic adenocarcinoma of gastric origin, 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, PD-L1 combined positive score 2 on hepatic flexure biopsy ? CTs 10/23/2017-diffuse prominence of the gastric wall, especially the antrum, focal wall thickening of the distal ascending colon and hepatic flexure, thickening of the cecum, nonspecific haziness of the mesenteric fat in the pelvis, mild prominence of lymph nodes at the greater curvature of the stomach ? Treatment with FOLFOX beginning July 2019 ? CTs October 2019-stable disease, FOLFOX continued ? CTs December 2019-stable disease ? January 2020 treatment transition to Xeloda maintenance, care transition to [place] ? CTs in June 2020 in September 2020-stable disease, Xeloda continued ? CTs 06/23/2019-nonspecific thickening of the GE junction, intraluminal bladder mass ? Cystoscopy-metastatic signet ring  cell adenocarcinoma ? Cycle 1 day 1 ramucirumab/paclitaxel 08/19/2019(given atanother facility) ? Cycle 1 day 1  ramucirumab/paclitaxel 09/04/2019 ? Cycle 1 day 8 Taxol 09/10/19 ? Cycle 1 day 15 ramucirumab/paclitaxel 6/2/2021canceled d/t neutropenia ? Ramucirumab/pactlitaxelevery 2 weeks 09/24/2019 ? Ramucirumab/paclitaxel 10/08/2019 ? Ramucirumab/paclitaxel 10/22/2019 ? Ramucirumab/paclitaxel 11/05/2019 ? CTs 11/24/2019-unchanged thickening of the distal esophagus/upper stomach, gastric body and antrum. Unchanged thickening of the transverse colon at the hepatic flexure, unchanged thickening of the urinary bladder, no evidence of progressive disease ? Ramucirumab/paclitaxel 11/25/2019 ? Ramucirumab/paclitaxel 12/09/2019 ? Ramucirumab/paclitaxel 12/23/2019 ? Ramucirumab/paclitaxel9/21/2021 ? Ramucirumab/paclitaxel 01/20/2020 ? Ramucirumab/paclitaxel 02/04/2020 ? Ramucirumab/paclitaxel 02/17/2020 ? Ramucirumab/paclitaxel 03/02/2020 ? Ramucirumab/paclitaxel 03/16/2020 ? CTs 03/28/2020-stable thickening of the distal esophagus, GE junction, gastric body, and antrum. Decreased soft tissue thickening of the transverse colon at the hepatic flexure, no evidence of metastatic disease to the chest ? Ramucirumab/paclitaxel 03/30/2020 ? SBRT to the stomach 04/12/2020-05/06/2020, 37.5 Gray in 15 fractions ? Ramucirumab/paclitaxel 04/05/2020 ? Ramucirumab/paclitaxel 04/13/2020 ? Ramucirumab/paclitaxel 04/27/2020 ? Ramucirumab/paclitaxel 05/25/2020 ? Ramucirumab/paclitaxel 06/11/2020 ? Ramucirumab/paclitaxel 06/22/2020 ? Ramucirumab/paclitaxel 07/06/2020 ? Ramucirumab/paclitaxel 07/20/2020 ? CT abdomen/pelvis 08/03/2020- wall thickening at the distal esophagus and stomach body/antrum-progressive, indistinct stranding around the descending duodenum, porta hepatis, and retroperitoneum- stable, abnormal wall thickening at the left upper urinary bladder-stable ? Ramucirumab/paclitaxel 08/11/2020  2.Left breast cancer 2008,pT1c,pN0, status post a left lumpectomy with adjuvant chemotherapy and radiation, ER positive, PR positive,  HER-2 positive  3.Mixed connective tissue disease/SLE  4.Lower extremity deep vein thrombosis maintained on apixaban-placed on hold due to hematemesis, hematuria 08/19/2020  5.Family history of multiple cancers including breast and ovarian cancer  6.Dysphagia and intermittent vomiting secondary to #1  7.Hypertension  8.Peripheral neuropathy  9.Masslike fullness at the posterior right parotid/angle of the jaw  10. Dyspnea on exertion, ongoing for about a month, worsened after taxol/ramucirumab on 09/04/19 requiring ED visit on 5/23. CBC, CMP, troponin, BNP and chest xray negative  11.Anemia likely secondary to combination of chemotherapy and blood loss  2 units packed red blood cells 04/12/2020, 07/20/2020 12.Admission 05/11/2020 with pneumonia, CT chest revealed left upper and lower lobe opacities 13.  Progressive abdominal pain beginning 07/29/2020- etiology unclear, likely related to progression of tumor in the stomach   Disposition: [name] appears unchanged.  She is accompanied by her mother.  She is on active treatment with Taxol/ramucirumab.  She has had 2 episodes of hematemesis and continues to have intermittent hematuria.  She is maintained on Eliquis for history of a DVT in 2019.  We discussed the risk-benefit ratio for continuing Eliquis in the setting of active bleeding.  Dr. Gearldine Shown recommendation is to place Eliquis on hold.  She understands she is at risk for another blood clot.  She further understands bleeding and clotting are both potentially life-threatening.  She agrees with placing Eliquis on hold.  She will contact the office with signs/symptoms of a blood clot.  We are making an urgent referral to gastroenterology to consider an upper endoscopy.  We are placing Taxol/ramucirumab on hold.  She will return for follow-up on 09/01/2020.  She and her mother understand to seek emergency medical care with recurrent bleeding.  Patient seen  with Dr. Benay Spice.  Ned Card ANP/GNP-BC   08/19/2020  12:02 PM This was a shared visit with Ned Card.  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  She had 2 recent episodes of small-volume hematemesis likely related to tumor in the stomach while on anticoagulation therapy.  She continues to have hematuria.  She has required transfusion support on 2 occasions  over the past month.  We discussed the risk versus benefit of continuing anticoagulation therapy.  We decided to discontinue Eliquis.  She will call for symptoms of venous thrombosis.  We will refer her to gastroenterology for evaluation of the stomach.  Treatment will be placed on hold until after an upper endoscopy.  I was present for greater than 50% of today's visit.  I performed medical decision making.  Julieanne Manson, MD

## 2020-08-19 NOTE — Telephone Encounter (Signed)
Called patient to f/u on [employer] call last night reporting hematemesis last night of ~ 1 tablespoon. She reports no nausea or further vomiting today. Still feels weak and short of breath w/minimal exertion. Was told to go to ER last night and patient declined. Mother came on phone and is insisting on her daughter being admitted to hospital for shortness of breath, weakness, weight loss. Says all she does is take pain pills and sleep. Asking if Dr. Benay Spice will put her in hospital? Is willing to bring her in today for evaluation.

## 2020-08-19 NOTE — Telephone Encounter (Signed)
Per Dr. Benay Spice: Will have NP see her today at 11:15 with CBC prior. High priority scheduling message sent.

## 2020-08-20 ENCOUNTER — Other Ambulatory Visit: Payer: Self-pay

## 2020-08-20 ENCOUNTER — Telehealth: Payer: Self-pay

## 2020-08-20 DIAGNOSIS — C169 Malignant neoplasm of stomach, unspecified: Secondary | ICD-10-CM

## 2020-08-20 NOTE — Telephone Encounter (Signed)
Yes, 24 hours  thanks

## 2020-08-20 NOTE — Telephone Encounter (Signed)
The pt has been advised of the appt for EGD on 5/12 and COVID test on 5/9.  She will keep appt with Anderson Malta on Tuesday.  She will come in for labs on Monday as well. All case information, orders and referral have been made. Instructions in Epic.   Waiting to see if the pt needs to stop Eliquis.

## 2020-08-20 NOTE — Telephone Encounter (Signed)
Dr Ardis Hughs does this pt need to stop her Eliquis?

## 2020-08-20 NOTE — Telephone Encounter (Signed)
-----   Message from Milus Banister, MD sent at 08/20/2020  7:37 AM EDT ----- Leroy Sea, we will take care of this.  Thanks  Anderson Malta and [place], Dr. Benay Spice contacted me yesterday about expediting her care.  She has known metastatic gastric cancer and he would like an idea of the tumor burden in her stomach as she has never had endoscopy locally.  I can add her onto the end of my [facility] Thursday schedule next week, the 12th.  I think it would probably still be a good idea that she shows for her appointment with Anderson Malta that is already scheduled for 1:30 on Tuesday the 10th.  Repeat CBC will be helpful but it sounds like her hematemesis is pretty small volume.   Brittani Purdum,  can you contact her to let her know about this.  She should continue with plans for office visit with Anderson Malta on the 10th and lets get her on the books for EGD with me at [facility] at the end of my current schedule Thursday the 12th, next week.     Thank you all ----- Message ----- From: Ladell Pier, MD Sent: 08/19/2020   5:11 PM EDT To: Milus Banister, MD  This is the patient with metastatic gastric cancer and small-volume hematemesis  Thanks for seeing her  Leroy Sea

## 2020-08-20 NOTE — Telephone Encounter (Signed)
The pt has been advised to hold Eliquis 24 hours prior to the EGD

## 2020-08-23 ENCOUNTER — Other Ambulatory Visit (INDEPENDENT_AMBULATORY_CARE_PROVIDER_SITE_OTHER): Payer: Medicare Other

## 2020-08-23 ENCOUNTER — Other Ambulatory Visit: Payer: Self-pay | Admitting: Oncology

## 2020-08-23 ENCOUNTER — Other Ambulatory Visit: Payer: Self-pay

## 2020-08-23 ENCOUNTER — Encounter (HOSPITAL_COMMUNITY): Payer: Self-pay | Admitting: Gastroenterology

## 2020-08-23 ENCOUNTER — Other Ambulatory Visit (HOSPITAL_COMMUNITY)
Admission: RE | Admit: 2020-08-23 | Discharge: 2020-08-23 | Disposition: A | Discharge status: Home / Self Care | Payer: Medicare Other | Source: Ambulatory Visit | Attending: Gastroenterology | Admitting: Gastroenterology

## 2020-08-23 DIAGNOSIS — Z01812 Encounter for preprocedural laboratory examination: Secondary | ICD-10-CM | POA: Insufficient documentation

## 2020-08-23 DIAGNOSIS — Z20822 Contact with and (suspected) exposure to covid-19: Secondary | ICD-10-CM | POA: Insufficient documentation

## 2020-08-23 DIAGNOSIS — C169 Malignant neoplasm of stomach, unspecified: Secondary | ICD-10-CM

## 2020-08-23 LAB — CBC WITH DIFFERENTIAL/PLATELET
Basophils Absolute: 0 10*3/uL (ref 0.0–0.1)
Basophils Relative: 0.2 % (ref 0.0–3.0)
Eosinophils Absolute: 0 10*3/uL (ref 0.0–0.7)
Eosinophils Relative: 0.9 % (ref 0.0–5.0)
HCT: 33 % — ABNORMAL LOW (ref 36.0–46.0)
Hemoglobin: 10.9 g/dL — ABNORMAL LOW (ref 12.0–15.0)
Lymphocytes Relative: 25.7 % (ref 12.0–46.0)
Lymphs Abs: 1 10*3/uL (ref 0.7–4.0)
MCHC: 33 g/dL (ref 30.0–36.0)
MCV: 85.6 fl (ref 78.0–100.0)
Monocytes Absolute: 0.6 10*3/uL (ref 0.1–1.0)
Monocytes Relative: 15.6 % — ABNORMAL HIGH (ref 3.0–12.0)
Neutro Abs: 2.2 10*3/uL (ref 1.4–7.7)
Neutrophils Relative %: 57.6 % (ref 43.0–77.0)
Platelets: 291 10*3/uL (ref 150.0–400.0)
RBC: 3.86 Mil/uL — ABNORMAL LOW (ref 3.87–5.11)
RDW: 19.3 % — ABNORMAL HIGH (ref 11.5–15.5)
WBC: 3.9 10*3/uL — ABNORMAL LOW (ref 4.0–10.5)

## 2020-08-24 ENCOUNTER — Encounter: Payer: Self-pay | Admitting: Physician Assistant

## 2020-08-24 ENCOUNTER — Ambulatory Visit (INDEPENDENT_AMBULATORY_CARE_PROVIDER_SITE_OTHER): Payer: Medicare Other | Admitting: Physician Assistant

## 2020-08-24 VITALS — BP 92/56 | HR 66 | Ht 64.0 in | Wt 169.0 lb

## 2020-08-24 DIAGNOSIS — K920 Hematemesis: Secondary | ICD-10-CM

## 2020-08-24 DIAGNOSIS — Z85028 Personal history of other malignant neoplasm of stomach: Secondary | ICD-10-CM

## 2020-08-24 LAB — SARS CORONAVIRUS 2 (TAT 6-24 HRS): SARS Coronavirus 2: NEGATIVE

## 2020-08-24 NOTE — Patient Instructions (Signed)
If you are age 53 or older, your body mass index should be between 23-30. Your Body mass index is 29.01 kg/m. If this is out of the aforementioned range listed, please consider follow up with your Primary Care Provider.  If you are age 36 or younger, your body mass index should be between 19-25. Your Body mass index is 29.01 kg/m. If this is out of the aformentioned range listed, please consider follow up with your Primary Care Provider.   Due to recent changes in healthcare laws, you may see the results of your imaging and laboratory studies on MyChart before your provider has had a chance to review them.  We understand that in some cases there may be results that are confusing or concerning to you. Not all laboratory results come back in the same time frame and the provider may be waiting for multiple results in order to interpret others.  Please give [place] 48 hours in order for your provider to thoroughly review all the results before contacting the office for clarification of your results.   Thank you for choosing me and [facility].  Ellouise Newer, PA-C

## 2020-08-24 NOTE — H&P (View-Only) (Signed)
Chief Complaint: Hematemesis  HPI:    Michelle Cain is a 55 year old female, known to Dr. Ardis Hughs, with a past medical history as listed below including DVT, fibromyalgia, lupus and stomach and colon cancer on chemotherapy, who was referred to me by Owens Shark, NP for a complaint of hematemesis.      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    10/05/2017 colonoscopy at the cancer center treatment of [place] in [place] revealed multiple foci of thickening/masses at the cecum, hepatic flexure and distal rectum, biopsies revealed metastatic adenocarcinoma of gastric origin.    08/03/2020 CT the abdomen pelvis with wall thickening of the distal esophagus and stomach body/antrum-progressive, indistinct stranding around the descending duodenum, porta hepatis and retroperitoneum-stable, abnormal wall thickening of the left upper urinary bladder-stable.    08/16/2020 CT angio of the chest PE with or without contrast with circumferential thickening of the distal esophagus again noted which may represent residual of previous treated neoplasm but also could represent a degree of esophagitis.    08/19/2020 patient noted to have hematemesis and hematuria.  Her apixaban was put on hold.    08/23/2020 CBC with a hemoglobin of 10.9 (10.8 on 5/5).  COVID-negative.    Patient already has EGD scheduled with Dr. Ardis Hughs at the hospital on 08/26/2020.    Today, the patient presents to clinic and tells me that she has been having abdominal pain since the end of April and episodes of hematemesis since May 5.  Explains that the first episode was it "a lot of bright red blood", and she has had about 3 or 4 since then with the last episode last week with just about a tablespoon of blood.  Continues with some discomfort in her epigastrium as well as left upper quadrant but tells me she passed a good stool and felt like that  helped with some of the pain.    Denies fever or chills.  Past Medical History:  Diagnosis Date  . Anemia   . Arthritis   . Breast CA ([place]) dx'd 2007   left  . Carpal tunnel syndrome on both sides   . DVT (deep venous thrombosis) ([facility])    Left leg  . Dyspnea    with activity,   . Fibromyalgia   . History of blood transfusion 08/17/2020  . Lupus ([place])   . Pneumonia    History of  . Raynaud disease   . stomach/colon ca dx'd 2020    Past Surgical History:  Procedure Laterality Date  . BREAST SURGERY Left    with lymph nodes  . CHOLECYSTECTOMY    . UPPER GI ENDOSCOPY      Current Outpatient Medications  Medication Sig Dispense Refill  . albuterol (VENTOLIN HFA) 108 (90 Base) MCG/ACT inhaler Inhale 2 puffs into the lungs every 6 (six) hours as needed for shortness of breath or wheezing.    Marland Kitchen apixaban (ELIQUIS) 5 MG TABS tablet Take 5 mg by mouth in the morning and at bedtime. Stopped May 5 or May 6th    . azaTHIOprine (IMURAN) 50 MG tablet Take 50 mg by mouth 2 (two) times daily.    . Cholecalciferol 50 MCG (2000 UT) CAPS Take 2,000 Units by mouth every evening.    . cyclobenzaprine (FLEXERIL) 10 MG tablet Take 10 mg by mouth 3 (three) times daily as needed for muscle spasms.    Marland Kitchen docusate sodium (COLACE) 100 MG capsule  Take 100 mg by mouth 2 (two) times daily as needed (constipation).    . gabapentin (NEURONTIN) 300 MG capsule Take 300 mg by mouth in the morning and at bedtime.    . hydroxychloroquine (PLAQUENIL) 200 MG tablet Take 200 mg by mouth 2 (two) times daily.    . metoCLOPramide (REGLAN) 10 MG tablet Take 1 tablet (10 mg total) by mouth 3 (three) times daily before meals. 90 tablet 0  . ondansetron (ZOFRAN) 8 MG tablet Take 1 tablet (8 mg total) by mouth every 8 (eight) hours as needed for nausea or vomiting. 30 tablet 1  . oxyCODONE-acetaminophen (PERCOCET) 5-325 MG tablet Take 1-2 tablets by mouth every 6 (six) hours as needed for severe pain. 80 tablet 0  .  Polyethylene Glycol 3350 (MIRALAX PO) Take 17 g by mouth 2 (two) times daily as needed (constipation).    . potassium chloride (KLOR-CON) 10 MEQ tablet Take 10 mEq by mouth every evening.    . prochlorperazine (COMPAZINE) 10 MG tablet Take 10 mg by mouth every 6 (six) hours as needed for nausea or vomiting.     . traMADol (ULTRAM) 50 MG tablet Take 1 tablet (50 mg total) by mouth every 8 (eight) hours as needed for pain. 90 tablet 1   No current facility-administered medications for this visit.   Facility-Administered Medications Ordered in Other Visits  Medication Dose Route Frequency Provider Last Rate Last Admin  . sodium chloride flush (NS) 0.9 % injection 10 mL  10 mL Intravenous PRN Ladell Pier, MD   10 mL at 09/17/19 1109    Allergies as of 08/24/2020  . (No Known Allergies)    Family History  Problem Relation Age of Onset  . Sudden death Neg Hx   . Hypertension Neg Hx   . Hyperlipidemia Neg Hx   . Heart attack Neg Hx   . Diabetes Neg Hx     Social History   Socioeconomic History  . Marital status: Divorced    Spouse name: Not on file  . Number of children: Not on file  . Years of education: Not on file  . Highest education level: Not on file  Occupational History  . Not on file  Tobacco Use  . Smoking status: Never Smoker  . Smokeless tobacco: Never Used  Vaping Use  . Vaping Use: Never used  Substance and Sexual Activity  . Alcohol use: No  . Drug use: No  . Sexual activity: Yes    Birth control/protection: Post-menopausal  Other Topics Concern  . Not on file  Social History Narrative  . Not on file   Social Determinants of Health   Financial Resource Strain: Not on file  Food Insecurity: Not on file  Transportation Needs: Not on file  Physical Activity: Not on file  Stress: Not on file  Social Connections: Not on file  Intimate Partner Violence: Not on file    Review of Systems:    Constitutional: No fever or chills Skin: No  rash Cardiovascular: No chest pain Respiratory: No SOB Gastrointestinal: See HPI and otherwise negative Genitourinary: No dysuria Neurological: No headache, dizziness or syncope Musculoskeletal: No new muscle or joint pain Hematologic: No bruising Psychiatric: No history of depression or anxiety   Physical Exam:  Vital signs: BP (!) 92/56   Pulse 66   Ht 5\' 4"  (1.626 m)   Wt 169 lb (76.7 kg)   BMI 29.01 kg/m   Constitutional:   Pleasant AA female appears to be  in NAD, Well developed, Well nourished, alert and cooperative Head:  Normocephalic and atraumatic. Eyes:   PEERL, EOMI. No icterus. Conjunctiva pink. Ears:  Normal auditory acuity. Neck:  Supple Throat: Oral cavity and pharynx without inflammation, swelling or lesion.  Respiratory: Respirations even and unlabored. Lungs clear to auscultation bilaterally.   No wheezes, crackles, or rhonchi.  Cardiovascular: Normal S1, S2. No MRG. Regular rate and rhythm. No peripheral edema, cyanosis or pallor.  Gastrointestinal:  Soft, nondistended, moderate epigastric ttp. No rebound or guarding. Normal bowel sounds. No appreciable masses or hepatomegaly. Rectal:  Not performed.  Msk:  Symmetrical without gross deformities. Without edema, no deformity or joint abnormality. +in wheelchair Neurologic:  Alert and  oriented x4;  grossly normal neurologically.  Skin:   Dry and intact without significant lesions or rashes. Psychiatric:  Demonstrates good judgement and reason without abnormal affect or behaviors.  RELEVANT LABS AND IMAGING: CBC    Component Value Date/Time   WBC 3.9 (L) 08/23/2020 1409   RBC 3.86 (L) 08/23/2020 1409   HGB 10.9 (L) 08/23/2020 1409   HGB 10.8 (L) 08/19/2020 1136   HCT 33.0 (L) 08/23/2020 1409   PLT 291.0 08/23/2020 1409   PLT 211 08/19/2020 1136   MCV 85.6 08/23/2020 1409   MCH 27.1 08/19/2020 1136   MCHC 33.0 08/23/2020 1409   RDW 19.3 (H) 08/23/2020 1409   LYMPHSABS 1.0 08/23/2020 1409   MONOABS 0.6  08/23/2020 1409   EOSABS 0.0 08/23/2020 1409   BASOSABS 0.0 08/23/2020 1409    CMP     Component Value Date/Time   NA 135 08/16/2020 1014   K 3.6 08/16/2020 1014   CL 102 08/16/2020 1014   CO2 26 08/16/2020 1014   GLUCOSE 159 (H) 08/16/2020 1014   BUN 14 08/16/2020 1014   CREATININE 0.50 08/16/2020 1014   CALCIUM 8.6 (L) 08/16/2020 1014   PROT 6.3 (L) 08/16/2020 1014   ALBUMIN 3.4 (L) 08/16/2020 1014   AST 16 08/16/2020 1014   ALT 10 08/16/2020 1014   ALKPHOS 75 08/16/2020 1014   BILITOT 0.6 08/16/2020 1014   GFRNONAA >60 08/16/2020 1014   GFRAA >60 01/06/2020 0954    Assessment: 1.  Hematemesis: 3-4 episodes of hematemesis in a patient with history of gastric cancer and recent imaging showing thickening at the bottom of the esophagus; concern for recurrent cancer versus other  Plan: 1.  Patient is already scheduled for an EGD at [facility] with Dr. Ardis Hughs on 08/26/2020.  Reviewed risks with the patient and she agrees to proceed. 2.  Patient's Eliquis is already on hold.  Her CBC is stable. 3.  Patient to follow in clinic per recommendations from Dr. Ardis Hughs after time of procedure.  Ellouise Newer, PA-C [facility] 08/24/2020, 1:24 PM  Cc: Owens Shark, NP

## 2020-08-24 NOTE — Progress Notes (Signed)
 Chief Complaint: Hematemesis  HPI:    Michelle Cain is a 55-year-old female, known to Dr. Jacobs, with a past medical history as listed below including DVT, fibromyalgia, lupus and stomach and colon cancer on chemotherapy, who was referred to me by Thomas, Lisa K, NP for a complaint of hematemesis.      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    08/03/2020 CT the abdomen pelvis with wall thickening of the distal esophagus and stomach body/antrum-progressive, indistinct stranding around the descending duodenum, porta hepatis and retroperitoneum-stable, abnormal wall thickening of the left upper urinary bladder-stable.    08/16/2020 CT angio of the chest PE with or without contrast with circumferential thickening of the distal esophagus again noted which may represent residual of previous treated neoplasm but also could represent a degree of esophagitis.    08/19/2020 patient noted to have hematemesis and hematuria.  Her apixaban was put on hold.    08/23/2020 CBC with a hemoglobin of 10.9 (10.8 on 5/5).  COVID-negative.    Patient already has EGD scheduled with Dr. Jacobs at the hospital on 08/26/2020.    Today, the patient presents to clinic and tells me that she has been having abdominal pain since the end of April and episodes of hematemesis since May 5.  Explains that the first episode was it "a lot of bright red blood", and she has had about 3 or 4 since then with the last episode last week with just about a tablespoon of blood.  Continues with some discomfort in her epigastrium as well as left upper quadrant but tells me she passed a good stool and felt like that  helped with some of the pain.    Denies fever or chills.  Past Medical History:  Diagnosis Date  . Anemia   . Arthritis   . Breast CA (HCC) dx'd 2007   left  . Carpal tunnel syndrome on both sides   . DVT (deep venous thrombosis) (HCC)    Left leg  . Dyspnea    with activity,   . Fibromyalgia   . History of blood transfusion 08/17/2020  . Lupus (HCC)   . Pneumonia    History of  . Raynaud disease   . stomach/colon ca dx'd 2020    Past Surgical History:  Procedure Laterality Date  . BREAST SURGERY Left    with lymph nodes  . CHOLECYSTECTOMY    . UPPER GI ENDOSCOPY      Current Outpatient Medications  Medication Sig Dispense Refill  . albuterol (VENTOLIN HFA) 108 (90 Base) MCG/ACT inhaler Inhale 2 puffs into the lungs every 6 (six) hours as needed for shortness of breath or wheezing.    . apixaban (ELIQUIS) 5 MG TABS tablet Take 5 mg by mouth in the morning and at bedtime. Stopped May 5 or May 6th    . azaTHIOprine (IMURAN) 50 MG tablet Take 50 mg by mouth 2 (two) times daily.    . Cholecalciferol 50 MCG (2000 UT) CAPS Take 2,000 Units by mouth every evening.    . cyclobenzaprine (FLEXERIL) 10 MG tablet Take 10 mg by mouth 3 (three) times daily as needed for muscle spasms.    . docusate sodium (COLACE) 100 MG capsule   Take 100 mg by mouth 2 (two) times daily as needed (constipation).    . gabapentin (NEURONTIN) 300 MG capsule Take 300 mg by mouth in the morning and at bedtime.    . hydroxychloroquine (PLAQUENIL) 200 MG tablet Take 200 mg by mouth 2 (two) times daily.    . metoCLOPramide (REGLAN) 10 MG tablet Take 1 tablet (10 mg total) by mouth 3 (three) times daily before meals. 90 tablet 0  . ondansetron (ZOFRAN) 8 MG tablet Take 1 tablet (8 mg total) by mouth every 8 (eight) hours as needed for nausea or vomiting. 30 tablet 1  . oxyCODONE-acetaminophen (PERCOCET) 5-325 MG tablet Take 1-2 tablets by mouth every 6 (six) hours as needed for severe pain. 80 tablet 0  .  Polyethylene Glycol 3350 (MIRALAX PO) Take 17 g by mouth 2 (two) times daily as needed (constipation).    . potassium chloride (KLOR-CON) 10 MEQ tablet Take 10 mEq by mouth every evening.    . prochlorperazine (COMPAZINE) 10 MG tablet Take 10 mg by mouth every 6 (six) hours as needed for nausea or vomiting.     . traMADol (ULTRAM) 50 MG tablet Take 1 tablet (50 mg total) by mouth every 8 (eight) hours as needed for pain. 90 tablet 1   No current facility-administered medications for this visit.   Facility-Administered Medications Ordered in Other Visits  Medication Dose Route Frequency Provider Last Rate Last Admin  . sodium chloride flush (NS) 0.9 % injection 10 mL  10 mL Intravenous PRN Sherrill, Gary B, MD   10 mL at 09/17/19 1109    Allergies as of 08/24/2020  . (No Known Allergies)    Family History  Problem Relation Age of Onset  . Sudden death Neg Hx   . Hypertension Neg Hx   . Hyperlipidemia Neg Hx   . Heart attack Neg Hx   . Diabetes Neg Hx     Social History   Socioeconomic History  . Marital status: Divorced    Spouse name: Not on file  . Number of children: Not on file  . Years of education: Not on file  . Highest education level: Not on file  Occupational History  . Not on file  Tobacco Use  . Smoking status: Never Smoker  . Smokeless tobacco: Never Used  Vaping Use  . Vaping Use: Never used  Substance and Sexual Activity  . Alcohol use: No  . Drug use: No  . Sexual activity: Yes    Birth control/protection: Post-menopausal  Other Topics Concern  . Not on file  Social History Narrative  . Not on file   Social Determinants of Health   Financial Resource Strain: Not on file  Food Insecurity: Not on file  Transportation Needs: Not on file  Physical Activity: Not on file  Stress: Not on file  Social Connections: Not on file  Intimate Partner Violence: Not on file    Review of Systems:    Constitutional: No fever or chills Skin: No  rash Cardiovascular: No chest pain Respiratory: No SOB Gastrointestinal: See HPI and otherwise negative Genitourinary: No dysuria Neurological: No headache, dizziness or syncope Musculoskeletal: No new muscle or joint pain Hematologic: No bruising Psychiatric: No history of depression or anxiety   Physical Exam:  Vital signs: BP (!) 92/56   Pulse 66   Ht 5' 4" (1.626 m)   Wt 169 lb (76.7 kg)   BMI 29.01 kg/m   Constitutional:   Pleasant AA female appears to be   in NAD, Well developed, Well nourished, alert and cooperative Head:  Normocephalic and atraumatic. Eyes:   PEERL, EOMI. No icterus. Conjunctiva pink. Ears:  Normal auditory acuity. Neck:  Supple Throat: Oral cavity and pharynx without inflammation, swelling or lesion.  Respiratory: Respirations even and unlabored. Lungs clear to auscultation bilaterally.   No wheezes, crackles, or rhonchi.  Cardiovascular: Normal S1, S2. No MRG. Regular rate and rhythm. No peripheral edema, cyanosis or pallor.  Gastrointestinal:  Soft, nondistended, moderate epigastric ttp. No rebound or guarding. Normal bowel sounds. No appreciable masses or hepatomegaly. Rectal:  Not performed.  Msk:  Symmetrical without gross deformities. Without edema, no deformity or joint abnormality. +in wheelchair Neurologic:  Alert and  oriented x4;  grossly normal neurologically.  Skin:   Dry and intact without significant lesions or rashes. Psychiatric:  Demonstrates good judgement and reason without abnormal affect or behaviors.  RELEVANT LABS AND IMAGING: CBC    Component Value Date/Time   WBC 3.9 (L) 08/23/2020 1409   RBC 3.86 (L) 08/23/2020 1409   HGB 10.9 (L) 08/23/2020 1409   HGB 10.8 (L) 08/19/2020 1136   HCT 33.0 (L) 08/23/2020 1409   PLT 291.0 08/23/2020 1409   PLT 211 08/19/2020 1136   MCV 85.6 08/23/2020 1409   MCH 27.1 08/19/2020 1136   MCHC 33.0 08/23/2020 1409   RDW 19.3 (H) 08/23/2020 1409   LYMPHSABS 1.0 08/23/2020 1409   MONOABS 0.6  08/23/2020 1409   EOSABS 0.0 08/23/2020 1409   BASOSABS 0.0 08/23/2020 1409    CMP     Component Value Date/Time   NA 135 08/16/2020 1014   K 3.6 08/16/2020 1014   CL 102 08/16/2020 1014   CO2 26 08/16/2020 1014   GLUCOSE 159 (H) 08/16/2020 1014   BUN 14 08/16/2020 1014   CREATININE 0.50 08/16/2020 1014   CALCIUM 8.6 (L) 08/16/2020 1014   PROT 6.3 (L) 08/16/2020 1014   ALBUMIN 3.4 (L) 08/16/2020 1014   AST 16 08/16/2020 1014   ALT 10 08/16/2020 1014   ALKPHOS 75 08/16/2020 1014   BILITOT 0.6 08/16/2020 1014   GFRNONAA >60 08/16/2020 1014   GFRAA >60 01/06/2020 0954    Assessment: 1.  Hematemesis: 3-4 episodes of hematemesis in a patient with history of gastric cancer and recent imaging showing thickening at the bottom of the esophagus; concern for recurrent cancer versus other  Plan: 1.  Patient is already scheduled for an EGD at [facility] with Dr. Ardis Hughs on 08/26/2020.  Reviewed risks with the patient and she agrees to proceed. 2.  Patient's Eliquis is already on hold.  Her CBC is stable. 3.  Patient to follow in clinic per recommendations from Dr. Ardis Hughs after time of procedure.  Ellouise Newer, PA-C [facility] 08/24/2020, 1:24 PM  Cc: Owens Shark, NP

## 2020-08-24 NOTE — Progress Notes (Signed)
I agree with the above note, plan 

## 2020-08-25 ENCOUNTER — Ambulatory Visit: Payer: Medicare Other | Admitting: Nurse Practitioner

## 2020-08-25 ENCOUNTER — Other Ambulatory Visit: Payer: Medicare Other

## 2020-08-25 ENCOUNTER — Ambulatory Visit: Payer: Medicare Other

## 2020-08-26 ENCOUNTER — Other Ambulatory Visit: Payer: Self-pay

## 2020-08-26 ENCOUNTER — Ambulatory Visit (HOSPITAL_COMMUNITY): Payer: Medicare Other | Admitting: Anesthesiology

## 2020-08-26 ENCOUNTER — Encounter (HOSPITAL_COMMUNITY)
Admission: RE | Disposition: A | Discharge status: Home / Self Care | Payer: Self-pay | Source: Ambulatory Visit | Attending: Gastroenterology

## 2020-08-26 ENCOUNTER — Ambulatory Visit (HOSPITAL_COMMUNITY)
Admission: RE | Admit: 2020-08-26 | Discharge: 2020-08-26 | Disposition: A | Discharge status: Home / Self Care | Payer: Medicare Other | Source: Ambulatory Visit | Attending: Gastroenterology | Admitting: Gastroenterology

## 2020-08-26 ENCOUNTER — Encounter (HOSPITAL_COMMUNITY): Payer: Self-pay | Admitting: Gastroenterology

## 2020-08-26 DIAGNOSIS — Z79899 Other long term (current) drug therapy: Secondary | ICD-10-CM | POA: Insufficient documentation

## 2020-08-26 DIAGNOSIS — C169 Malignant neoplasm of stomach, unspecified: Secondary | ICD-10-CM

## 2020-08-26 DIAGNOSIS — D490 Neoplasm of unspecified behavior of digestive system: Secondary | ICD-10-CM

## 2020-08-26 DIAGNOSIS — K920 Hematemesis: Secondary | ICD-10-CM | POA: Diagnosis present

## 2020-08-26 HISTORY — DX: Unspecified osteoarthritis, unspecified site: M19.90

## 2020-08-26 HISTORY — DX: Pneumonia, unspecified organism: J18.9

## 2020-08-26 HISTORY — DX: Anemia, unspecified: D64.9

## 2020-08-26 HISTORY — DX: Acute embolism and thrombosis of unspecified deep veins of unspecified lower extremity: I82.409

## 2020-08-26 HISTORY — DX: Carpal tunnel syndrome, bilateral upper limbs: G56.03

## 2020-08-26 HISTORY — DX: Dyspnea, unspecified: R06.00

## 2020-08-26 HISTORY — DX: Fibromyalgia: M79.7

## 2020-08-26 HISTORY — PX: ESOPHAGOGASTRODUODENOSCOPY (EGD) WITH PROPOFOL: SHX5813

## 2020-08-26 SURGERY — ESOPHAGOGASTRODUODENOSCOPY (EGD) WITH PROPOFOL
Anesthesia: Monitor Anesthesia Care

## 2020-08-26 MED ORDER — PROPOFOL 10 MG/ML IV BOLUS
INTRAVENOUS | Status: DC | PRN
  Administered 2020-08-26: 50 mg via INTRAVENOUS

## 2020-08-26 MED ORDER — PHENYLEPHRINE HCL (PRESSORS) 10 MG/ML IV SOLN
INTRAVENOUS | Status: DC | PRN
  Administered 2020-08-26: 120 ug via INTRAVENOUS
  Administered 2020-08-26: 160 ug via INTRAVENOUS

## 2020-08-26 MED ORDER — PROPOFOL 500 MG/50ML IV EMUL
INTRAVENOUS | Status: DC | PRN
  Administered 2020-08-26: 150 ug/kg/min via INTRAVENOUS

## 2020-08-26 MED ORDER — SUCRALFATE 1 GM/10ML PO SUSP: 1.0000 g | Freq: Four times a day (QID) | ORAL | 6 refills | Status: DC

## 2020-08-26 MED ORDER — LIDOCAINE HCL (CARDIAC) PF 100 MG/5ML IV SOSY
PREFILLED_SYRINGE | INTRAVENOUS | Status: DC | PRN
  Administered 2020-08-26: 60 mg via INTRAVENOUS

## 2020-08-26 MED ORDER — GLYCOPYRROLATE PF 0.2 MG/ML IJ SOSY
PREFILLED_SYRINGE | INTRAMUSCULAR | Status: DC | PRN
  Administered 2020-08-26: .1 mg via INTRAVENOUS

## 2020-08-26 MED ORDER — SODIUM CHLORIDE 0.9 % IV SOLN: INTRAVENOUS | Status: DC

## 2020-08-26 MED ORDER — PROPOFOL 500 MG/50ML IV EMUL
INTRAVENOUS | Status: AC
  Filled 2020-08-26: qty 150

## 2020-08-26 MED ORDER — OMEPRAZOLE 40 MG PO CPDR: 40.0000 mg | DELAYED_RELEASE_CAPSULE | Freq: Two times a day (BID) | ORAL | 11 refills | Status: DC

## 2020-08-26 MED ORDER — LACTATED RINGERS IV SOLN
INTRAVENOUS | Status: DC
  Administered 2020-08-26: 1000 mL via INTRAVENOUS

## 2020-08-26 SURGICAL SUPPLY — 14 items

## 2020-08-26 NOTE — Op Note (Signed)
[facility] Patient Name: Michelle Cain Procedure Date: 08/26/2020 MRN: [id number] Attending MD: Milus Banister , MD Date of Birth: 24-Sep-1965 CSN: [id number] Age: 55 Admit Type: Outpatient Procedure:                Upper GI endoscopy Indications:              Hematemesis, known metastatic gastric cancer,                            stopped eliquis 6 days ago Providers:                Milus Banister, MD, Burtis Junes, RN, Laverda Sorenson,                            Technician, Enrigue Catena, CRNA Referring MD:             Julieanne Manson, MD Medicines:                Monitored Anesthesia Care Complications:            No immediate complications. Estimated blood loss:                            None. Estimated Blood Loss:     Estimated blood loss: none. Procedure:                Pre-Anesthesia Assessment:                           - Prior to the procedure, a History and Physical                            was performed, and patient medications and                            allergies were reviewed. The patient's tolerance of                            previous anesthesia was also reviewed. The risks                            and benefits of the procedure and the sedation                            options and risks were discussed with the patient.                            All questions were answered, and informed consent                            was obtained. Prior Anticoagulants: The patient has                            taken Eliquis (apixaban), last dose was 7 days  prior to procedure. ASA Grade Assessment: III - A                            patient with severe systemic disease. After                            reviewing the risks and benefits, the patient was                            deemed in satisfactory condition to undergo the                            procedure.                           After obtaining informed consent, the  endoscope was                            passed under direct vision. Throughout the                            procedure, the patient's blood pressure, pulse, and                            oxygen saturations were monitored continuously. The                            GIF-H190 ([id number]) Olympus gastroscope was                            introduced through the mouth, and advanced to the                            second part of duodenum. The upper GI endoscopy was                            accomplished without difficulty. The patient                            tolerated the procedure well. Scope In: Scope Out: Findings:      The gastric mucosa was nearly completely replaced with cancer. The most       proximal aspect of the stomach was spared but starting at the proximal       gastric body there was extensive, circumferential cancer that continued       into the proximal duodenal bulb. The tumor was ulcerated in some areas,       edematous throughout, very friable with spontaneous oozing. The lumen of       the distal stomach was narrowed due to edematous malignant changes but       this did not limit scope passage.      The exam was otherwise without abnormality. Impression:               - Near complete malignant transformation of her  gastric mucosa leading to spontaenous bleeding. The                            gastric lumen is narrowed for the distal 1/3 of the                            stomach. I do not think she has vomiting from                            anatomic outlet obstruction, rather the malignant                            transformation is probably signficantly limiting                            the normal gastric function, peristalsis.                           - I think the bleeding risks of resuming her                            eliquis blood thinner probably outweigh the                            potential benefits and recommend against  ever                            resuming a blood thinner.                           - She is not on PPI and I am going to start one,                            BID. Also will start carafate TID. This may                            decrease her risk of future bleeding a bit. Moderate Sedation:      Not Applicable - Patient had care per Anesthesia. Recommendation:           - Discharge patient to home.                           - I will communicate these results to Dr. Benay Spice. Procedure Code(s):        --- Professional ---                           [phone], Esophagogastroduodenoscopy, flexible,                            transoral; diagnostic, including collection of                            specimen(s) by brushing or washing, when performed                            (  separate procedure) Diagnosis Code(s):        --- Professional ---                           D49.0, Neoplasm of unspecified behavior of                            digestive system                           K92.0, Hematemesis CPT copyright 2019 American Medical Association. All rights reserved. The codes documented in this report are preliminary and upon coder review may  be revised to meet current compliance requirements. Milus Banister, MD 08/26/2020 11:28:11 AM This report has been signed electronically. Number of Addenda: 0

## 2020-08-26 NOTE — Anesthesia Preprocedure Evaluation (Addendum)
Anesthesia Evaluation  Patient identified by MRN, date of birth, ID band Patient awake    Reviewed: Allergy & Precautions, NPO status , Patient's Chart, lab work & pertinent test results  Airway Mallampati: II  TM Distance: >3 FB Neck ROM: Full    Dental  (+)  Several missing teeth:   Pulmonary shortness of breath and with exertion,    Pulmonary exam normal breath sounds clear to auscultation       Cardiovascular Exercise Tolerance: Good METS: 3 - Mets Normal cardiovascular exam Rhythm:Regular Rate:Normal  H/o DVT   Neuro/Psych negative neurological ROS  negative psych ROS   GI/Hepatic Neg liver ROS, H/o stomach/colon cancer   Endo/Other  Lupus; raynaud's  Renal/GU negative Renal ROS  negative genitourinary   Musculoskeletal  (+) Arthritis , Fibromyalgia -  Abdominal Normal abdominal exam  (+)   Peds negative pediatric ROS (+)  Hematology  (+) anemia ,   Anesthesia Other Findings   Reproductive/Obstetrics negative OB ROS                           Anesthesia Physical Anesthesia Plan  ASA: III  Anesthesia Plan: MAC   Post-op Pain Management:    Induction: Intravenous  PONV Risk Score and Plan: 2 and Propofol infusion, TIVA and Treatment may vary due to age or medical condition  Airway Management Planned: Natural Airway and Nasal Cannula  Additional Equipment: None  Intra-op Plan:   Post-operative Plan:   Informed Consent: I have reviewed the patients History and Physical, chart, labs and discussed the procedure including the risks, benefits and alternatives for the proposed anesthesia with the patient or authorized representative who has indicated his/her understanding and acceptance.       Plan Discussed with: CRNA and Anesthesiologist  Anesthesia Plan Comments: (Patient not on any anticoagulant)       Anesthesia Quick Evaluation

## 2020-08-26 NOTE — Transfer of Care (Signed)
Immediate Anesthesia Transfer of Care Note  Patient: Michelle Cain  Procedure(s) Performed: ESOPHAGOGASTRODUODENOSCOPY (EGD) WITH PROPOFOL (N/A )  Patient Location: PACU and Endoscopy Unit  Anesthesia Type:MAC  Level of Consciousness: awake, alert  and oriented  Airway & Oxygen Therapy: Patient Spontanous Breathing and Patient connected to face mask  Post-op Assessment: Report given to RN and Post -op Vital signs reviewed and stable  Post vital signs: Reviewed and stable  Last Vitals:  Vitals Value Taken Time  BP 116/56 08/26/20 1108  Temp    Pulse 108 08/26/20 1110  Resp 32 08/26/20 1110  SpO2 100 % 08/26/20 1110  Vitals shown include unvalidated device data.  Last Pain:  Vitals:   08/26/20 1020  TempSrc: Oral  PainSc: 0-No pain         Complications: No complications documented.

## 2020-08-26 NOTE — Anesthesia Postprocedure Evaluation (Signed)
Anesthesia Post Note  Patient: Michelle Cain  Procedure(s) Performed: ESOPHAGOGASTRODUODENOSCOPY (EGD) WITH PROPOFOL (N/A )     Patient location during evaluation: Endoscopy Anesthesia Type: MAC Level of consciousness: awake and alert Pain management: pain level controlled Vital Signs Assessment: post-procedure vital signs reviewed and stable Respiratory status: spontaneous breathing, nonlabored ventilation and respiratory function stable Cardiovascular status: blood pressure returned to baseline and stable Postop Assessment: no apparent nausea or vomiting Anesthetic complications: no   No complications documented.  Last Vitals:  Vitals:   08/26/20 1120 08/26/20 1130  BP: 106/70 120/66  Pulse: 88 96  Resp: (!) 21 15  Temp:    SpO2: 97% 100%    Last Pain:  Vitals:   08/26/20 1130  TempSrc:   PainSc: 0-No pain                 [name]

## 2020-08-26 NOTE — Discharge Instructions (Signed)
YOU HAD AN ENDOSCOPIC PROCEDURE TODAY: Refer to the procedure report and other information in the discharge instructions given to you for any specific questions about what was found during the examination. If this information does not answer your questions, please call [place] office at [phone] to clarify.   YOU SHOULD EXPECT: Some feelings of bloating in the abdomen. Passage of more gas than usual. Walking can help get rid of the air that was put into your GI tract during the procedure and reduce the bloating. If you had a lower endoscopy (such as a colonoscopy or flexible sigmoidoscopy) you may notice spotting of blood in your stool or on the toilet paper. Some abdominal soreness may be present for a day or two, also.  DIET: Your first meal following the procedure should be a light meal and then it is ok to progress to your normal diet. A half-sandwich or bowl of soup is an example of a good first meal. Heavy or fried foods are harder to digest and may make you feel nauseous or bloated. Drink plenty of fluids but you should avoid alcoholic beverages for 24 hours. If you had a esophageal dilation, please see attached instructions for diet.    ACTIVITY: Your care partner should take you home directly after the procedure. You should plan to take it easy, moving slowly for the rest of the day. You can resume normal activity the day after the procedure however YOU SHOULD NOT DRIVE, use power tools, machinery or perform tasks that involve climbing or major physical exertion for 24 hours (because of the sedation medicines used during the test).   SYMPTOMS TO REPORT IMMEDIATELY: A gastroenterologist can be reached at any hour. Please call [phone]  for any of the following symptoms:   Following upper endoscopy (EGD, EUS, ERCP, esophageal dilation) Vomiting of blood or coffee ground material  New, significant abdominal pain  New, significant chest pain or pain under the shoulder blades  Painful or  persistently difficult swallowing  New shortness of breath  Black, tarry-looking or red, bloody stools  FOLLOW UP:  If any biopsies were taken you will be contacted by phone or by letter within the next 1-3 weeks. Call [phone]  if you have not heard about the biopsies in 3 weeks.  Please also call with any specific questions about appointments or follow up tests.  

## 2020-08-26 NOTE — Interval H&P Note (Signed)
History and Physical Interval Note:  08/26/2020 9:57 AM  Michelle Cain  has presented today for surgery, with the diagnosis of GASTRIC CANCER.  The various methods of treatment have been discussed with the patient and family. After consideration of risks, benefits and other options for treatment, the patient has consented to  Procedure(s): ESOPHAGOGASTRODUODENOSCOPY (EGD) WITH PROPOFOL (N/A) as a surgical intervention.  The patient's history has been reviewed, patient examined, no change in status, stable for surgery.  I have reviewed the patient's chart and labs.  Questions were answered to the patient's satisfaction.     Michelle Cain

## 2020-08-29 ENCOUNTER — Encounter (HOSPITAL_COMMUNITY): Payer: Self-pay | Admitting: Gastroenterology

## 2020-08-30 ENCOUNTER — Telehealth: Payer: Self-pay | Admitting: Physician Assistant

## 2020-08-30 ENCOUNTER — Telehealth: Payer: Self-pay

## 2020-08-30 NOTE — Telephone Encounter (Signed)
Patient called states her insurance will not pay for the Carafate medication seeking an alternative.

## 2020-08-30 NOTE — Telephone Encounter (Signed)
Prior Authorization has been approved by [employer] . Authorization number :[id number].Marland Kitchen Letter was stamped and sent down to scan.

## 2020-08-30 NOTE — Telephone Encounter (Signed)
Returned patients call and informed her that prior authorization had went through. I also called the pharmacy and had them run the prescription again and it went through with no charge.

## 2020-09-01 ENCOUNTER — Other Ambulatory Visit: Payer: Self-pay

## 2020-09-01 ENCOUNTER — Inpatient Hospital Stay: Payer: Medicare Other

## 2020-09-01 ENCOUNTER — Inpatient Hospital Stay (HOSPITAL_BASED_OUTPATIENT_CLINIC_OR_DEPARTMENT_OTHER): Payer: Medicare Other | Admitting: Oncology

## 2020-09-01 ENCOUNTER — Encounter: Payer: Self-pay | Admitting: Nurse Practitioner

## 2020-09-01 ENCOUNTER — Inpatient Hospital Stay (HOSPITAL_BASED_OUTPATIENT_CLINIC_OR_DEPARTMENT_OTHER): Payer: Medicare Other | Admitting: Nurse Practitioner

## 2020-09-01 ENCOUNTER — Encounter: Payer: Self-pay | Admitting: *Deleted

## 2020-09-01 VITALS — BP 119/85 | HR 125 | Temp 98.1°F | Resp 18 | Ht 64.0 in | Wt 165.6 lb

## 2020-09-01 DIAGNOSIS — Z95828 Presence of other vascular implants and grafts: Secondary | ICD-10-CM

## 2020-09-01 DIAGNOSIS — C169 Malignant neoplasm of stomach, unspecified: Secondary | ICD-10-CM

## 2020-09-01 LAB — CBC WITH DIFFERENTIAL (CANCER CENTER ONLY)
Abs Immature Granulocytes: 0.06 10*3/uL (ref 0.00–0.07)
Basophils Absolute: 0 10*3/uL (ref 0.0–0.1)
Basophils Relative: 0 %
Eosinophils Absolute: 0 10*3/uL (ref 0.0–0.5)
Eosinophils Relative: 0 %
HCT: 29.5 % — ABNORMAL LOW (ref 36.0–46.0)
Hemoglobin: 9.1 g/dL — ABNORMAL LOW (ref 12.0–15.0)
Immature Granulocytes: 1 %
Lymphocytes Relative: 14 %
Lymphs Abs: 1.1 10*3/uL (ref 0.7–4.0)
MCH: 27.7 pg (ref 26.0–34.0)
MCHC: 30.8 g/dL (ref 30.0–36.0)
MCV: 89.7 fL (ref 80.0–100.0)
Monocytes Absolute: 0.7 10*3/uL (ref 0.1–1.0)
Monocytes Relative: 9 %
Neutro Abs: 6.1 10*3/uL (ref 1.7–7.7)
Neutrophils Relative %: 76 %
Platelet Count: 215 10*3/uL (ref 150–400)
RBC: 3.29 MIL/uL — ABNORMAL LOW (ref 3.87–5.11)
RDW: 20 % — ABNORMAL HIGH (ref 11.5–15.5)
WBC Count: 8 10*3/uL (ref 4.0–10.5)
nRBC: 0.2 % (ref 0.0–0.2)

## 2020-09-01 LAB — SAMPLE TO BLOOD BANK

## 2020-09-01 LAB — CMP (CANCER CENTER ONLY)
ALT: 7 U/L (ref 0–44)
AST: 16 U/L (ref 15–41)
Albumin: 3.4 g/dL — ABNORMAL LOW (ref 3.5–5.0)
Alkaline Phosphatase: 85 U/L (ref 38–126)
Anion gap: 8 (ref 5–15)
BUN: 11 mg/dL (ref 6–20)
CO2: 24 mmol/L (ref 22–32)
Calcium: 8.5 mg/dL — ABNORMAL LOW (ref 8.9–10.3)
Chloride: 104 mmol/L (ref 98–111)
Creatinine: 0.43 mg/dL — ABNORMAL LOW (ref 0.44–1.00)
GFR, Estimated: >60 mL/min (ref >60)
Glucose, Bld: 116 mg/dL — ABNORMAL HIGH (ref 70–99)
Potassium: 4 mmol/L (ref 3.5–5.1)
Sodium: 136 mmol/L (ref 135–145)
Total Bilirubin: 0.4 mg/dL (ref 0.3–1.2)
Total Protein: 6.5 g/dL (ref 6.5–8.1)

## 2020-09-01 MED ORDER — SODIUM CHLORIDE 0.9% FLUSH
10.0000 mL | Freq: Once | INTRAVENOUS | Status: DC
  Filled 2020-09-01: qty 10

## 2020-09-01 MED ORDER — HEPARIN SOD (PORK) LOCK FLUSH 100 UNIT/ML IV SOLN
500.0000 [IU] | Freq: Once | INTRAVENOUS | Status: DC
  Filled 2020-09-01: qty 5

## 2020-09-01 NOTE — Progress Notes (Signed)
Faxed referral order, demographics, chemo flow sheets, and all pathology testing to [facility] referral department at  [email or web address] Requested radiology push over her last CT chest and A/P.

## 2020-09-01 NOTE — Progress Notes (Addendum)
[facility] OFFICE PROGRESS NOTE   Diagnosis: Gastric cancer  INTERVAL HISTORY:   [name] returns as scheduled.  She underwent an upper endoscopy to evaluate hematemesis by Dr. Ardis Hughs on 08/26/2020.  The gastric mucosa was nearly completely replaced with cancer.  The most proximal aspect of the stomach was spared but starting at the proximal gastric body there was extensive, circumferential cancer that continued in the proximal duodenal bulb.  The tumor was ulcerated in some areas, edematous throughout, very friable with spontaneous oozing.  The lumen of the distal stomach was narrowed due to edematous malignant changes but this did not limit scope passage.  Dr. Ardis Hughs recommends against ever resuming a blood thinner.  She continues to have periodic episodes of vomiting blood.  No recent hematuria.  She reports a good appetite.  She vomits periodically.  No vomiting for the past several days.  Objective:  Vital signs in last 24 hours:  Blood pressure 119/85, pulse (!) 125, temperature 98.1 F (36.7 C), temperature source Oral, resp. rate 18, height _0  (1.626 m), weight 165 lb 9.6 oz (75.1 kg), SpO2 100 %.    Resp: Lungs clear bilaterally. Cardio: Regular, tachycardic. GI: Abdomen soft and nontender.  No hepatomegaly. Vascular: No leg edema. Port-A-Cath without erythema.  Lab Results:  Lab Results  Component Value Date   WBC 8.0 09/01/2020   HGB 9.1 (L) 09/01/2020   HCT 29.5 (L) 09/01/2020   MCV 89.7 09/01/2020   PLT 215 09/01/2020   NEUTROABS 6.1 09/01/2020    Imaging:  No results found.  Medications: I have reviewed the patient's current medications.  Assessment/Plan: 1. Gastric cancer ? Presenting with dysphagia spring 2019 ? 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 CTs revealed wall thickening and luminal narrowing of the colon at the hepatic flexure and cecum ? PET scan with no evidence of distant metastatic disease or abnormal uptake other than thickening of the distal esophagus and proximal stomach ? 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 CTs 10/23/2017-diffuse prominence of the gastric wall, especially the antrum, focal wall thickening of the distal ascending colon and hepatic flexure, thickening of the cecum, nonspecific haziness of the mesenteric fat in the pelvis, mild prominence of lymph nodes at the greater curvature of the stomach ? Treatment with FOLFOX beginning July 2019 ? CTs October 2019-stable disease, FOLFOX continued ? CTs December 2019-stable disease ? January 2020 treatment transition to Xeloda maintenance, care transition to [facility] ? CTs in June 2020 in September 2020-stable disease, Xeloda continued ? CTs 06/23/2019-nonspecific thickening of the GE junction, intraluminal bladder mass ? Cystoscopy-metastatic signet ring cell adenocarcinoma ? Cycle 1 day 1 ramucirumab/paclitaxel 08/19/2019(given atanother facility) ? Cycle 1 day 1 ramucirumab/paclitaxel 09/04/2019 ? Cycle 1 day 8 Taxol 09/10/19 ? Cycle 1 day 15 ramucirumab/paclitaxel 6/2/2021canceled d/t neutropenia ? Ramucirumab/pactlitaxelevery 2 weeks 09/24/2019 ? Ramucirumab/paclitaxel 10/08/2019 ? Ramucirumab/paclitaxel 10/22/2019 ? Ramucirumab/paclitaxel 11/05/2019 ? CTs 11/24/2019-unchanged thickening  of the distal esophagus/upper stomach, gastric body and antrum. Unchanged  thickening of the transverse colon at the hepatic flexure, unchanged thickening of the urinary bladder, no evidence of progressive disease ? Ramucirumab/paclitaxel 11/25/2019 ? Ramucirumab/paclitaxel 12/09/2019 ? Ramucirumab/paclitaxel 12/23/2019 ? Ramucirumab/paclitaxel9/21/2021 ? Ramucirumab/paclitaxel 01/20/2020 ? Ramucirumab/paclitaxel 02/04/2020 ? Ramucirumab/paclitaxel 02/17/2020 ? Ramucirumab/paclitaxel 03/02/2020 ? Ramucirumab/paclitaxel 03/16/2020 ? CTs 03/28/2020-stable thickening of the distal esophagus, GE junction, gastric body, and antrum. Decreased soft tissue thickening of the transverse colon at the hepatic flexure, no evidence of metastatic disease to the chest ? Ramucirumab/paclitaxel 03/30/2020 ? SBRT to the stomach 04/12/2020-05/06/2020, 37.5 Gray in 15 fractions ? Ramucirumab/paclitaxel 04/05/2020 ? Ramucirumab/paclitaxel 04/13/2020 ? Ramucirumab/paclitaxel 04/27/2020 ? Ramucirumab/paclitaxel 05/25/2020 ? Ramucirumab/paclitaxel 06/11/2020 ? Ramucirumab/paclitaxel 06/22/2020 ? Ramucirumab/paclitaxel 07/06/2020 ? Ramucirumab/paclitaxel 07/20/2020 ? CT abdomen/pelvis 08/03/2020-wall thickening at the distal esophagus and stomach body/antrum-progressive, indistinct stranding around the descending duodenum, porta hepatis, and retroperitoneum-stable, abnormal wall thickening at the left upper urinary bladder-stable ? Ramucirumab/paclitaxel 08/11/2020 ? Upper endoscopy 08/26/2020- gastric mucosa nearly completely replaced with cancer.  Most proximal aspect of the stomach was spared but starting at the proximal gastric body there was extensive, circumferential cancer that continued into the proximal duodenal bulb.  The tumor is ulcerated in some areas, edematous throughout, very friable with spontaneous oozing.  The lumen of the distal stomach was narrowed due to edematous malignant changes but this did not limit scope  passage.  2.Left breast cancer 2008,pT1c,pN0, status post a left lumpectomy with adjuvant chemotherapy and radiation, ER positive, PR positive, HER-2 positive  3.Mixed connective tissue disease/SLE  4.Lower extremity deep vein thrombosis maintained on apixaban-placed on hold due to hematemesis, hematuria 08/19/2020.  See findings of upper endoscopy from 08/26/2020.  Dr. Ardis Hughs recommends against ever resuming a blood thinner.  5.Family history of multiple cancers including breast and ovarian cancer  6.Dysphagia and intermittent vomiting secondary to #1  7.Hypertension  8.Peripheral neuropathy  9.Masslike fullness at the posterior right parotid/angle of the jaw  10. Dyspnea on exertion, ongoing for about a month, worsened after taxol/ramucirumab on 09/04/19 requiring ED visit on 5/23. CBC, CMP, troponin, BNP and chest xray negative  11.Anemia likely secondary to combination of chemotherapy and blood loss  2 units packed red blood cells 04/12/2020, 07/20/2020 12.Admission 05/11/2020 with pneumonia, CT chest revealed left upper and lower lobe opacities 13. Progressive abdominal pain beginning 07/29/2020-etiology unclear, likely related to progression of tumor in the stomach   Disposition: [name] appears unchanged.  The recent upper endoscopy shows near complete replacement of the gastric mucosa with cancer.  We reviewed this finding with [name] and her mother at today's appointment.  Dr. Benay Spice recommends discontinuation of Taxol/ramucirumab.  Dr. Benay Spice discussed salvage systemic therapy with FOLFIRI, referral to [facility] or [facility] for a second opinion/clinical trial availability.  She would like to proceed with the referral to [facility].  We will place a referral today.  Anticoagulation will remain on hold.  She will continue Prilosec and Carafate as prescribed by Dr. Ardis Hughs.  She will return for lab and follow-up in 2 weeks.  We are available to see  her sooner if needed.  Patient seen with Dr. Benay Spice.    Ned Card ANP/GNP-BC   09/01/2020  10:39 AM  This was a shared visit with Ned Card.  Ms. Stalling underwent an upper endoscopy by Dr. Ardis Hughs.  I discussed the case with Dr. Ardis Hughs.  This revealed extensive involvement of the stomach with tumor.  We discussed the endoscopic findings with Ms. Weissberg and her mother.  We discussed treatment options.  They understand no therapy will be  curative.  There is a small chance of a clinical response with standard salvage therapy.  She will be referred for a second opinion.    I was present for greater than 50% of today's visit.  I performed medical decision making.  Julieanne Manson, MD

## 2020-09-02 ENCOUNTER — Telehealth: Payer: Self-pay | Admitting: Oncology

## 2020-09-02 NOTE — Telephone Encounter (Signed)
Release: [id number] Per Urgent Outgoing Referral. Faxed medical records to Dr Oralia Rud or Dr Fanny Skates @ [facility] @ fax # [phone]

## 2020-09-02 NOTE — Progress Notes (Signed)
Confirmed today with [facility] referral line that they did receive the referral and will be reaching out to patient today for appointment.

## 2020-09-08 ENCOUNTER — Other Ambulatory Visit: Payer: Self-pay

## 2020-09-08 ENCOUNTER — Ambulatory Visit: Payer: Medicare Other

## 2020-09-08 ENCOUNTER — Inpatient Hospital Stay (HOSPITAL_COMMUNITY)
Admission: EM | Admit: 2020-09-08 | Discharge: 2020-09-14 | DRG: 164 | Disposition: A | Discharge status: Home Health | Payer: Medicare Other | Attending: Internal Medicine | Admitting: Internal Medicine

## 2020-09-08 ENCOUNTER — Emergency Department (HOSPITAL_COMMUNITY): Payer: Medicare Other

## 2020-09-08 ENCOUNTER — Ambulatory Visit: Payer: Medicare Other | Admitting: Oncology

## 2020-09-08 ENCOUNTER — Other Ambulatory Visit: Payer: Medicare Other

## 2020-09-08 ENCOUNTER — Encounter (HOSPITAL_COMMUNITY): Payer: Self-pay | Admitting: *Deleted

## 2020-09-08 DIAGNOSIS — R531 Weakness: Secondary | ICD-10-CM | POA: Diagnosis not present

## 2020-09-08 DIAGNOSIS — I2602 Saddle embolus of pulmonary artery with acute cor pulmonale: Secondary | ICD-10-CM | POA: Diagnosis present

## 2020-09-08 DIAGNOSIS — E860 Dehydration: Secondary | ICD-10-CM | POA: Diagnosis present

## 2020-09-08 DIAGNOSIS — G629 Polyneuropathy, unspecified: Secondary | ICD-10-CM | POA: Diagnosis present

## 2020-09-08 DIAGNOSIS — Z86718 Personal history of other venous thrombosis and embolism: Secondary | ICD-10-CM

## 2020-09-08 DIAGNOSIS — I82B11 Acute embolism and thrombosis of right subclavian vein: Secondary | ICD-10-CM | POA: Diagnosis present

## 2020-09-08 DIAGNOSIS — E669 Obesity, unspecified: Secondary | ICD-10-CM | POA: Diagnosis present

## 2020-09-08 DIAGNOSIS — E872 Acidosis: Secondary | ICD-10-CM | POA: Diagnosis present

## 2020-09-08 DIAGNOSIS — Z9049 Acquired absence of other specified parts of digestive tract: Secondary | ICD-10-CM | POA: Diagnosis not present

## 2020-09-08 DIAGNOSIS — I82412 Acute embolism and thrombosis of left femoral vein: Secondary | ICD-10-CM | POA: Diagnosis present

## 2020-09-08 DIAGNOSIS — R739 Hyperglycemia, unspecified: Secondary | ICD-10-CM | POA: Diagnosis present

## 2020-09-08 DIAGNOSIS — Z170 Estrogen receptor positive status [ER+]: Secondary | ICD-10-CM

## 2020-09-08 DIAGNOSIS — I82452 Acute embolism and thrombosis of left peroneal vein: Secondary | ICD-10-CM | POA: Diagnosis present

## 2020-09-08 DIAGNOSIS — D500 Iron deficiency anemia secondary to blood loss (chronic): Secondary | ICD-10-CM | POA: Diagnosis not present

## 2020-09-08 DIAGNOSIS — D62 Acute posthemorrhagic anemia: Secondary | ICD-10-CM | POA: Diagnosis present

## 2020-09-08 DIAGNOSIS — Z20822 Contact with and (suspected) exposure to covid-19: Secondary | ICD-10-CM | POA: Diagnosis present

## 2020-09-08 DIAGNOSIS — D649 Anemia, unspecified: Secondary | ICD-10-CM | POA: Diagnosis not present

## 2020-09-08 DIAGNOSIS — R778 Other specified abnormalities of plasma proteins: Secondary | ICD-10-CM | POA: Diagnosis not present

## 2020-09-08 DIAGNOSIS — C168 Malignant neoplasm of overlapping sites of stomach: Secondary | ICD-10-CM | POA: Diagnosis not present

## 2020-09-08 DIAGNOSIS — I82432 Acute embolism and thrombosis of left popliteal vein: Secondary | ICD-10-CM | POA: Diagnosis present

## 2020-09-08 DIAGNOSIS — I2699 Other pulmonary embolism without acute cor pulmonale: Secondary | ICD-10-CM

## 2020-09-08 DIAGNOSIS — Z6830 Body mass index (BMI) 30.0-30.9, adult: Secondary | ICD-10-CM

## 2020-09-08 DIAGNOSIS — I82442 Acute embolism and thrombosis of left tibial vein: Secondary | ICD-10-CM | POA: Diagnosis present

## 2020-09-08 DIAGNOSIS — Z171 Estrogen receptor negative status [ER-]: Secondary | ICD-10-CM

## 2020-09-08 DIAGNOSIS — C189 Malignant neoplasm of colon, unspecified: Secondary | ICD-10-CM | POA: Diagnosis present

## 2020-09-08 DIAGNOSIS — Z7189 Other specified counseling: Secondary | ICD-10-CM | POA: Diagnosis not present

## 2020-09-08 DIAGNOSIS — K921 Melena: Secondary | ICD-10-CM | POA: Diagnosis present

## 2020-09-08 DIAGNOSIS — Z923 Personal history of irradiation: Secondary | ICD-10-CM

## 2020-09-08 DIAGNOSIS — C169 Malignant neoplasm of stomach, unspecified: Secondary | ICD-10-CM | POA: Diagnosis present

## 2020-09-08 DIAGNOSIS — E871 Hypo-osmolality and hyponatremia: Secondary | ICD-10-CM | POA: Diagnosis present

## 2020-09-08 DIAGNOSIS — N179 Acute kidney failure, unspecified: Secondary | ICD-10-CM | POA: Diagnosis present

## 2020-09-08 DIAGNOSIS — I248 Other forms of acute ischemic heart disease: Secondary | ICD-10-CM | POA: Diagnosis present

## 2020-09-08 DIAGNOSIS — I82A11 Acute embolism and thrombosis of right axillary vein: Secondary | ICD-10-CM | POA: Diagnosis present

## 2020-09-08 DIAGNOSIS — I10 Essential (primary) hypertension: Secondary | ICD-10-CM | POA: Diagnosis present

## 2020-09-08 DIAGNOSIS — R609 Edema, unspecified: Secondary | ICD-10-CM | POA: Diagnosis not present

## 2020-09-08 DIAGNOSIS — Z853 Personal history of malignant neoplasm of breast: Secondary | ICD-10-CM

## 2020-09-08 DIAGNOSIS — D6959 Other secondary thrombocytopenia: Secondary | ICD-10-CM | POA: Diagnosis present

## 2020-09-08 DIAGNOSIS — K922 Gastrointestinal hemorrhage, unspecified: Secondary | ICD-10-CM | POA: Diagnosis not present

## 2020-09-08 DIAGNOSIS — M329 Systemic lupus erythematosus, unspecified: Secondary | ICD-10-CM | POA: Diagnosis present

## 2020-09-08 DIAGNOSIS — I7300 Raynaud's syndrome without gangrene: Secondary | ICD-10-CM | POA: Diagnosis present

## 2020-09-08 DIAGNOSIS — Z515 Encounter for palliative care: Secondary | ICD-10-CM

## 2020-09-08 DIAGNOSIS — D630 Anemia in neoplastic disease: Secondary | ICD-10-CM | POA: Diagnosis present

## 2020-09-08 DIAGNOSIS — Z1501 Genetic susceptibility to malignant neoplasm of breast: Secondary | ICD-10-CM

## 2020-09-08 DIAGNOSIS — E876 Hypokalemia: Secondary | ICD-10-CM | POA: Diagnosis present

## 2020-09-08 DIAGNOSIS — M797 Fibromyalgia: Secondary | ICD-10-CM | POA: Diagnosis present

## 2020-09-08 LAB — IRON AND TIBC
Iron: 42 ug/dL (ref 28–170)
Saturation Ratios: 17 % (ref 10.4–31.8)
TIBC: 248 ug/dL — ABNORMAL LOW (ref 250–450)
UIBC: 206 ug/dL

## 2020-09-08 LAB — LIPASE, BLOOD: Lipase: 17 U/L (ref 11–51)

## 2020-09-08 LAB — COMPREHENSIVE METABOLIC PANEL
ALT: 8 U/L (ref 0–44)
AST: 24 U/L (ref 15–41)
Albumin: 2.9 g/dL — ABNORMAL LOW (ref 3.5–5.0)
Alkaline Phosphatase: 101 U/L (ref 38–126)
Anion gap: 10 (ref 5–15)
BUN: 17 mg/dL (ref 6–20)
CO2: 22 mmol/L (ref 22–32)
Calcium: 8.4 mg/dL — ABNORMAL LOW (ref 8.9–10.3)
Chloride: 103 mmol/L (ref 98–111)
Creatinine, Ser: 0.76 mg/dL (ref 0.44–1.00)
GFR, Estimated: >60 mL/min (ref >60)
Glucose, Bld: 185 mg/dL — ABNORMAL HIGH (ref 70–99)
Potassium: 3.9 mmol/L (ref 3.5–5.1)
Sodium: 135 mmol/L (ref 135–145)
Total Bilirubin: 0.2 mg/dL — ABNORMAL LOW (ref 0.3–1.2)
Total Protein: 6.7 g/dL (ref 6.5–8.1)

## 2020-09-08 LAB — TROPONIN I (HIGH SENSITIVITY)
Troponin I (High Sensitivity): 260 ng/L (ref <18)
Troponin I (High Sensitivity): 448 ng/L (ref <18)

## 2020-09-08 LAB — CBC WITH DIFFERENTIAL/PLATELET
Abs Immature Granulocytes: 0.1 10*3/uL — ABNORMAL HIGH (ref 0.00–0.07)
Basophils Absolute: 0 10*3/uL (ref 0.0–0.1)
Basophils Relative: 0 %
Eosinophils Absolute: 0.1 10*3/uL (ref 0.0–0.5)
Eosinophils Relative: 1 %
HCT: 25.2 % — ABNORMAL LOW (ref 36.0–46.0)
Hemoglobin: 7.3 g/dL — ABNORMAL LOW (ref 12.0–15.0)
Immature Granulocytes: 1 %
Lymphocytes Relative: 11 %
Lymphs Abs: 1.3 10*3/uL (ref 0.7–4.0)
MCH: 27 pg (ref 26.0–34.0)
MCHC: 29 g/dL — ABNORMAL LOW (ref 30.0–36.0)
MCV: 93.3 fL (ref 80.0–100.0)
Monocytes Absolute: 0.8 10*3/uL (ref 0.1–1.0)
Monocytes Relative: 7 %
Neutro Abs: 9.7 10*3/uL — ABNORMAL HIGH (ref 1.7–7.7)
Neutrophils Relative %: 80 %
Platelets: 184 10*3/uL (ref 150–400)
RBC: 2.7 MIL/uL — ABNORMAL LOW (ref 3.87–5.11)
RDW: 20.6 % — ABNORMAL HIGH (ref 11.5–15.5)
WBC: 12.1 10*3/uL — ABNORMAL HIGH (ref 4.0–10.5)
nRBC: 1 % — ABNORMAL HIGH (ref 0.0–0.2)

## 2020-09-08 LAB — CBG MONITORING, ED: Glucose-Capillary: 117 mg/dL — ABNORMAL HIGH (ref 70–99)

## 2020-09-08 LAB — MAGNESIUM: Magnesium: 1.8 mg/dL (ref 1.7–2.4)

## 2020-09-08 LAB — LACTIC ACID, PLASMA
Lactic Acid, Venous: 5.4 mmol/L (ref 0.5–1.9)
Lactic Acid, Venous: 2.6 mmol/L (ref 0.5–1.9)
Lactic Acid, Venous: 1.3 mmol/L (ref 0.5–1.9)

## 2020-09-08 LAB — SARS CORONAVIRUS 2 (TAT 6-24 HRS): SARS Coronavirus 2: NEGATIVE

## 2020-09-08 LAB — PREPARE RBC (CROSSMATCH)

## 2020-09-08 LAB — POC OCCULT BLOOD, ED: Fecal Occult Bld: POSITIVE — AB

## 2020-09-08 MED ORDER — SODIUM CHLORIDE 0.9 % IV SOLN
2.0000 g | Freq: Three times a day (TID) | INTRAVENOUS | Status: DC
  Administered 2020-09-09 (×2): 2 g via INTRAVENOUS
  Filled 2020-09-08 (×2): qty 2

## 2020-09-08 MED ORDER — VANCOMYCIN HCL 1500 MG/300ML IV SOLN: 1500.0000 mg | INTRAVENOUS | Status: DC

## 2020-09-08 MED ORDER — SODIUM CHLORIDE 0.9 % IV SOLN
200.0000 mg | Freq: Once | INTRAVENOUS | Status: DC
  Filled 2020-09-08: qty 10

## 2020-09-08 MED ORDER — DRONABINOL 2.5 MG PO CAPS
2.5000 mg | ORAL_CAPSULE | Freq: Two times a day (BID) | ORAL | Status: DC
  Administered 2020-09-08 – 2020-09-14 (×11): 2.5 mg via ORAL
  Filled 2020-09-08 (×11): qty 1

## 2020-09-08 MED ORDER — LACTATED RINGERS IV BOLUS
1000.0000 mL | Freq: Once | INTRAVENOUS | Status: AC
  Administered 2020-09-08: 1000 mL via INTRAVENOUS

## 2020-09-08 MED ORDER — OXYCODONE HCL 5 MG PO TABS
5.0000 mg | ORAL_TABLET | ORAL | Status: DC | PRN
  Administered 2020-09-08 – 2020-09-13 (×6): 5 mg via ORAL
  Filled 2020-09-08 (×6): qty 1

## 2020-09-08 MED ORDER — PROMETHAZINE HCL 12.5 MG PO TABS
12.5000 mg | ORAL_TABLET | Freq: Four times a day (QID) | ORAL | Status: DC | PRN
  Administered 2020-09-08 – 2020-09-10 (×3): 12.5 mg via ORAL
  Filled 2020-09-08 (×5): qty 1

## 2020-09-08 MED ORDER — ACETAMINOPHEN 650 MG RE SUPP: 650.0000 mg | Freq: Four times a day (QID) | RECTAL | Status: DC | PRN

## 2020-09-08 MED ORDER — SODIUM CHLORIDE 0.9 % IV BOLUS
1000.0000 mL | Freq: Once | INTRAVENOUS | Status: AC
  Administered 2020-09-08: 1000 mL via INTRAVENOUS

## 2020-09-08 MED ORDER — VANCOMYCIN HCL 1500 MG/300ML IV SOLN
1500.0000 mg | Freq: Once | INTRAVENOUS | Status: AC
  Administered 2020-09-08: 1500 mg via INTRAVENOUS
  Filled 2020-09-08: qty 300

## 2020-09-08 MED ORDER — ONDANSETRON HCL 4 MG/2ML IJ SOLN
4.0000 mg | Freq: Three times a day (TID) | INTRAMUSCULAR | Status: DC | PRN
  Administered 2020-09-08 – 2020-09-12 (×7): 4 mg via INTRAVENOUS
  Filled 2020-09-08 (×8): qty 2

## 2020-09-08 MED ORDER — SODIUM CHLORIDE 0.9% IV SOLUTION: Freq: Once | INTRAVENOUS | Status: DC

## 2020-09-08 MED ORDER — PANTOPRAZOLE SODIUM 40 MG IV SOLR
40.0000 mg | Freq: Two times a day (BID) | INTRAVENOUS | Status: DC
  Administered 2020-09-08 – 2020-09-14 (×12): 40 mg via INTRAVENOUS
  Filled 2020-09-08 (×12): qty 40

## 2020-09-08 MED ORDER — LACTATED RINGERS IV SOLN: INTRAVENOUS | Status: DC

## 2020-09-08 MED ORDER — BOOST / RESOURCE BREEZE PO LIQD CUSTOM
1.0000 | Freq: Three times a day (TID) | ORAL | Status: DC
  Administered 2020-09-09 – 2020-09-13 (×6): 1 via ORAL

## 2020-09-08 MED ORDER — ACETAMINOPHEN 325 MG PO TABS: 650.0000 mg | ORAL_TABLET | Freq: Four times a day (QID) | ORAL | Status: DC | PRN

## 2020-09-08 NOTE — ED Notes (Signed)
Left chest port accessed and blood returned.

## 2020-09-08 NOTE — ED Notes (Signed)
Critical Value  Reported to W. Plunkett   Lactic 5.4 Trop 260

## 2020-09-08 NOTE — ED Triage Notes (Signed)
Pt complains of shortness of breath, 2 syncopal episodes this morning. Pt is being treated for colon cancer, last chemo 1 month ago.

## 2020-09-08 NOTE — ED Notes (Addendum)
Updated patients mother Joaquim Lai per patient request.

## 2020-09-08 NOTE — Progress Notes (Signed)
Pharmacy Antibiotic Note  Michelle Cain is a 55 y.o. female admitted on 09/08/2020 with sepsis.  Pharmacy has been consulted for vancomycin dosing.  Plan: After blood cultures drawn: Vancomycin 1500mg  IV q24h, estimated AUC 445 using SCr 0.8 Consider vancomycin levels at steady state, goal AUC 400-550 Cefepime 2g IV q8h Follow up renal function & cultures   Weight: 74.6 kg (164 lb 7.4 oz)  Temp (24hrs), Avg:98.8 F (37.1 C), Min:98.5 F (36.9 C), Max:99 F (37.2 C)  Recent Labs  Lab 09/08/20 1213 09/08/20 1449  WBC 12.1*  --   CREATININE 0.76  --   LATICACIDVEN 5.4* 2.6*    Estimated Creatinine Clearance: 80.5 mL/min (by C-G formula based on SCr of 0.76 mg/dL).    No Known Allergies  Antimicrobials this admission:  5/25 Vanc >> 5/25 Cefepime >>  Dose adjustments this admission:   Microbiology results:  5/25 BCx:  Thank you for allowing pharmacy to be a part of this patient's care.  Peggyann Juba, PharmD, BCPS Pharmacy: [phone] 09/08/2020 5:21 PM

## 2020-09-08 NOTE — ED Provider Notes (Signed)
[facility] DEPT Provider Note   CSN: [id number] Arrival date & time: 09/08/20  1146     History Chief Complaint  Patient presents with  . Shortness of Breath  . Loss of Consciousness    Michelle Cain is a 55 y.o. female.  Patient is a 55 year old female with a history of gastric cancer who last had chemotherapy approximately 1 month ago, chronic anemia, lupus, prior DVT who is presenting today with multiple complaints.  Patient states over the last month she has had shortness of breath but in the last 1 to 2 days it is gotten significantly worse.  It is always been exertional but in the last few days she has not been able to walk from one room of her house to the other without becoming short of breath.  She also had 2 episodes of syncope today while in the bathroom.  She denies injury during these events.  She felt very lightheaded and dizzy prior to the events.  She has not had any chest pain, new cough, fever.  She has had poor oral intake and has intermittent vomiting.  She has had no unilateral leg pain and no leg swelling in general.  She did have a CT to rule out clot 3 weeks ago which was negative for acute issues.  She has had no recent change in medications.  She is planning on getting a referral to [facility] because the chemotherapy does not seem to be working.  She also continues to see Dr. Ardis Hughs with gastroenterology here and recently had an endoscopy done.  The history is provided by the patient and medical records.  Shortness of Breath Associated symptoms: syncope   Loss of Consciousness Associated symptoms: shortness of breath        Past Medical History:  Diagnosis Date  . Anemia   . Arthritis   . Breast CA ([place]) dx'd 2007   left  . Carpal tunnel syndrome on both sides   . DVT (deep venous thrombosis) ([facility])    Left leg  . Dyspnea    with activity,   . Fibromyalgia   . History of blood transfusion 08/17/2020  . Lupus ([place])   .  Pneumonia    History of  . Raynaud disease   . stomach/colon ca dx'd 2020    Patient Active Problem List   Diagnosis Date Noted  . Hypokalemia 05/12/2020  . Hyponatremia 05/12/2020  . CAP (community acquired pneumonia) 05/11/2020  . Port-A-Cath in place 10/22/2019  . Genetic testing 09/19/2019  . Malignant neoplasm of stomach ([place]) 08/26/2019  . Goals of care, counseling/discussion 08/26/2019  . Plantar fasciitis 07/31/2012  . Right knee pain 05/28/2012    Past Surgical History:  Procedure Laterality Date  . BREAST SURGERY Left    with lymph nodes  . CHOLECYSTECTOMY    . ESOPHAGOGASTRODUODENOSCOPY (EGD) WITH PROPOFOL N/A 08/26/2020   Procedure: ESOPHAGOGASTRODUODENOSCOPY (EGD) WITH PROPOFOL;  Surgeon: Milus Banister, MD;  Location: [facility] ENDOSCOPY;  Service: Endoscopy;  Laterality: N/A;  . UPPER GI ENDOSCOPY       OB History   No obstetric history on file.     Family History  Problem Relation Age of Onset  . Sudden death Neg Hx   . Hypertension Neg Hx   . Hyperlipidemia Neg Hx   . Heart attack Neg Hx   . Diabetes Neg Hx     Social History   Tobacco Use  . Smoking status: Never Smoker  . Smokeless tobacco:  Never Used  Vaping Use  . Vaping Use: Never used  Substance Use Topics  . Alcohol use: No  . Drug use: No    Home Medications Prior to Admission medications   Medication Sig Start Date End Date Taking? Authorizing Provider  albuterol (VENTOLIN HFA) 108 (90 Base) MCG/ACT inhaler Inhale 2 puffs into the lungs every 6 (six) hours as needed for shortness of breath or wheezing. 05/18/20   [provider]  azaTHIOprine (IMURAN) 50 MG tablet Take 50 mg by mouth 2 (two) times daily.    [provider]  Cholecalciferol 50 MCG (2000 UT) CAPS Take 2,000 Units by mouth every evening.    [provider]  cyclobenzaprine (FLEXERIL) 10 MG tablet Take 10 mg by mouth 3 (three) times daily as needed for muscle spasms. 10/27/19   [provider]  docusate sodium (COLACE) 100 MG capsule Take 100 mg by mouth 2 (two) times daily as needed (constipation). 08/11/20   Ladell Pier, MD  gabapentin (NEURONTIN) 300 MG capsule Take 300 mg by mouth in the morning and at bedtime. 05/29/18   [provider]  hydroxychloroquine (PLAQUENIL) 200 MG tablet Take 200 mg by mouth 2 (two) times daily.    [provider]  metoCLOPramide (REGLAN) 10 MG tablet Take 1 tablet (10 mg total) by mouth 3 (three) times daily before meals. 04/27/20   Owens Shark, NP  omeprazole (PRILOSEC) 40 MG capsule Take 1 capsule (40 mg total) by mouth 2 (two) times daily before a meal for 60 doses. 08/26/20 09/25/20  Milus Banister, MD  ondansetron (ZOFRAN) 8 MG tablet Take 1 tablet (8 mg total) by mouth every 8 (eight) hours as needed for nausea or vomiting. 05/26/20   Ladell Pier, MD  oxyCODONE-acetaminophen (PERCOCET) 5-325 MG tablet Take 1-2 tablets by mouth every 6 (six) hours as needed for severe pain. 08/04/20   Ladell Pier, MD  Polyethylene Glycol 3350 (MIRALAX PO) Take 17 g by mouth 2 (two) times daily as needed (constipation).    [provider]  potassium chloride (KLOR-CON) 10 MEQ tablet Take 10 mEq by mouth every evening. 11/10/14   [provider]  prochlorperazine (COMPAZINE) 10 MG tablet Take 10 mg by mouth every 6 (six) hours as needed for nausea or vomiting.  10/23/17   [provider]  sucralfate (CARAFATE) 1 GM/10ML suspension Take 10 mLs (1 g total) by mouth 4 (four) times daily. 08/26/20 08/26/21  Milus Banister, MD  traMADol (ULTRAM) 50 MG tablet Take 1 tablet (50 mg total) by mouth every 8 (eight) hours as needed for pain. 07/30/12   Dene Gentry, MD    Allergies    Patient has no known allergies.  Review of Systems   Review of Systems  Respiratory: Positive for shortness of breath.   Cardiovascular: Positive for syncope.  All other systems reviewed and are negative.   Physical Exam Updated  Vital Signs BP 91/74 (BP Location: Right Arm)   Pulse (!) 128   Temp 98.5 F (36.9 C) (Oral)   Resp (!) 22   SpO2 98%   Physical Exam Vitals and nursing note reviewed.  Constitutional:      General: She is not in acute distress.    Appearance: She is well-developed. She is ill-appearing.  HENT:     Head: Normocephalic and atraumatic.     Mouth/Throat:     Mouth: Mucous membranes are dry.  Eyes:     Pupils:  Pupils are equal, round, and reactive to light.  Cardiovascular:     Rate and Rhythm: Regular rhythm. Tachycardia present.     Heart sounds: Normal heart sounds. No murmur heard. No friction rub.  Pulmonary:     Effort: Pulmonary effort is normal. Tachypnea present.     Breath sounds: Normal breath sounds. No wheezing or rales.  Abdominal:     General: Bowel sounds are normal. There is no distension.     Palpations: Abdomen is soft.     Tenderness: There is no abdominal tenderness. There is no guarding or rebound.  Musculoskeletal:        General: No tenderness. Normal range of motion.     Right lower leg: No edema.     Left lower leg: No edema.     Comments: No edema  Skin:    General: Skin is warm and dry.     Coloration: Skin is pale.     Findings: No rash.  Neurological:     Mental Status: She is alert and oriented to person, place, and time. Mental status is at baseline.     Cranial Nerves: No cranial nerve deficit.  Psychiatric:        Mood and Affect: Mood normal.        Behavior: Behavior normal.     ED Results / Procedures / Treatments   Labs (all labs ordered are listed, but only abnormal results are displayed) Labs Reviewed  CBC WITH DIFFERENTIAL/PLATELET - Abnormal; Notable for the following components:      Result Value   WBC 12.1 (*)    RBC 2.70 (*)    Hemoglobin 7.3 (*)    HCT 25.2 (*)    MCHC 29.0 (*)    RDW 20.6 (*)    nRBC 1.0 (*)    Neutro Abs 9.7 (*)    Abs Immature Granulocytes 0.10 (*)    All other components within normal limits   COMPREHENSIVE METABOLIC PANEL - Abnormal; Notable for the following components:   Glucose, Bld 185 (*)    Calcium 8.4 (*)    Albumin 2.9 (*)    Total Bilirubin 0.2 (*)    All other components within normal limits  LACTIC ACID, PLASMA - Abnormal; Notable for the following components:   Lactic Acid, Venous 5.4 (*)    All other components within normal limits  CBG MONITORING, ED - Abnormal; Notable for the following components:   Glucose-Capillary 117 (*)    All other components within normal limits  TROPONIN I (HIGH SENSITIVITY) - Abnormal; Notable for the following components:   Troponin I (High Sensitivity) 260 (*)    All other components within normal limits  SARS CORONAVIRUS 2 (TAT 6-24 HRS)  LIPASE, BLOOD  URINALYSIS, ROUTINE W REFLEX MICROSCOPIC  TYPE AND SCREEN  PREPARE RBC (CROSSMATCH)    EKG EKG Interpretation  Date/Time:  Wednesday Sep 08 2020 12:30:29 EDT Ventricular Rate:  124 PR Interval:  70 QRS Duration: 109 QT Interval:  440 QTC Calculation: 633 R Axis:   2 Text Interpretation: Sinus tachycardia Abnormal inferior Q waves new Prolonged QT interval Confirmed by Blanchie Dessert ([id number]) on 09/08/2020 12:51:16 PM   Radiology DG Chest Port 1 View  Result Date: 09/08/2020 CLINICAL DATA:  Shortness of breath.  Syncopal episodes. EXAM: PORTABLE CHEST 1 VIEW COMPARISON:  09/07/2019 FINDINGS: Stable appearance of the left subclavian Port-A-Cath with the tip extending into the upper right atrium. There is a chronic kink in the port near the  inferior aspect of the left clavicle. Lungs are clear. Heart size is normal. Trachea is midline. No acute bone abnormality. IMPRESSION: 1. No acute cardiopulmonary disease. 2. Stable appearance of the left subclavian port with a kink in the catheter near the left clavicle. 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 Electronically Signed   By: Markus Daft M.D.   On: 09/08/2020 13:00     Procedures Procedures   Medications Ordered in ED Medications  lactated ringers bolus 1,000 mL (1,000 mLs Intravenous New Bag/Given 09/08/20 1221)    ED Course  I have reviewed the triage vital signs and the nursing notes.  Pertinent labs & imaging results that were available during my care of the patient were reviewed by me and considered in my medical decision making (see chart for details).    MDM Rules/Calculators/A&P                          Patient is a 55 year old female presenting today with shortness of breath, generalized weakness and 2 episodes of syncope.  This occurred today but the shortness of breath has been worsening over approximately a month.  Patient is ill-appearing but in no acute distress.  She is tachycardic here from 120-130 but O2 sats are 98% on room air.  Patient's blood pressure is borderline and she does report poor oral intake with intermittent vomiting.  Concern for anemia versus PE versus pleural effusion versus dehydration as the cause of her shortness of breath.  She has no prior cardiac history however could have a pericardial effusion, new ACS.  Patient's lungs do sound clear and low suspicion for asthma exacerbation at this time.  Patient will be given fluid.  Labs and EKG are pending.  1:40 PM CBC with leukocytosis of 12 and drop in hemoglobin to 7.3 from 9 last week.  CMP with mild AKI with creatinine of 1.76 from her baseline of .4-0.5.  Lipase within normal limits.  Patient's troponin is elevated today at 260 and lactic acid is 5.4.  Patient's EKG does show sinus tachycardia new prolonged QT syndrome but no ST changes suggestive of MI.  Suspect troponin elevation is more related to leak and strain.  Patient received 1 L of LR however was placed on a rate.  Transfused 1 unit.  Will admit for further care.  Chest x-ray is clear without evidence of effusions.  MDM Number of Diagnoses or Management Options   Amount and/or Complexity of Data  Reviewed Clinical lab tests: ordered and reviewed Tests in the radiology section of CPT: ordered and reviewed Tests in the medicine section of CPT: ordered and reviewed Decide to obtain previous medical records or to obtain history from someone other than the patient: yes Obtain history from someone other than the patient: yes Review and summarize past medical records: yes Discuss the patient with other providers: yes Independent visualization of images, tracings, or specimens: yes  Risk of Complications, Morbidity, and/or Mortality Presenting problems: high Diagnostic procedures: high Management options: high  Patient Progress Patient progress: improved  CRITICAL CARE Performed by: Smaran Gaus Total critical care time: 30 minutes Critical care time was exclusive of separately billable procedures and treating other patients. Critical care was necessary to treat or prevent imminent or life-threatening deterioration. Critical care was time spent personally by me on the following activities: development of treatment plan with patient and/or surrogate as well as nursing, discussions with  consultants, evaluation of patient's response to treatment, examination of patient, obtaining history from patient or surrogate, ordering and performing treatments and interventions, ordering and review of laboratory studies, ordering and review of radiographic studies, pulse oximetry and re-evaluation of patient's condition.  Final Clinical Impression(s) / ED Diagnoses Final diagnoses:  Dehydration  Symptomatic anemia    Rx / DC Orders ED Discharge Orders    None       Blanchie Dessert, MD 09/08/20 1343

## 2020-09-08 NOTE — H&P (Signed)
History and Physical    Michelle Cain GGE:[id number] DOB: 05-22-1965 DOA: 09/08/2020  PCP: Laurette Schimke, DO  Patient coming from: Home  Chief Complaint: weakness  HPI: Michelle Cain is a 55 y.o. female with medical history significant of gastric cancer, neuropathy, chronic nausea. Presenting with near syncopal episode. History is from mother at bedside. She reports that the patient woke up this morning and went to the bathroom. She started complaining of being hot and short over breath. She sat down on the toilet and then slumped over the sink. Her mother became concerned and asked what was happening. The patient continued to answer questions, albeit, sluggishly. This went on for about 6 minutes until more family arrived and was able to take her to the ED. During that time she was weak, lightheaded/faint, but did not lose consciousness.     ED Course: She was noted to have a drop in her Hgb. 1 unit of pRBCs were ordered. TRH was called for admission.   Review of Systems:  Denies CP, siezure like activity, LOC. Reports N, poor appetite, weakness, palpitations, dyspnea. Review of systems is otherwise negative for all not mentioned in HPI.   PMHx Past Medical History:  Diagnosis Date  . Anemia   . Arthritis   . Breast CA ([place]) dx'd 2007   left  . Carpal tunnel syndrome on both sides   . DVT (deep venous thrombosis) (HCC)    Left leg  . Dyspnea    with activity,   . Fibromyalgia   . History of blood transfusion 08/17/2020  . Lupus ([place])   . Pneumonia    History of  . Raynaud disease   . stomach/colon ca dx'd 2020    PSHx Past Surgical History:  Procedure Laterality Date  . BREAST SURGERY Left    with lymph nodes  . CHOLECYSTECTOMY    . ESOPHAGOGASTRODUODENOSCOPY (EGD) WITH PROPOFOL N/A 08/26/2020   Procedure: ESOPHAGOGASTRODUODENOSCOPY (EGD) WITH PROPOFOL;  Surgeon: Milus Banister, MD;  Location: [facility];  Service: Endoscopy;  Laterality: N/A;  . UPPER GI  ENDOSCOPY      SocHx  reports that she has never smoked. She has never used smokeless tobacco. She reports that she does not drink alcohol and does not use drugs.  No Known Allergies  FamHx Family History  Problem Relation Age of Onset  . Sudden death Neg Hx   . Hypertension Neg Hx   . Hyperlipidemia Neg Hx   . Heart attack Neg Hx   . Diabetes Neg Hx     Prior to Admission medications   Medication Sig Start Date End Date Taking? Authorizing Provider  albuterol (VENTOLIN HFA) 108 (90 Base) MCG/ACT inhaler Inhale 2 puffs into the lungs every 6 (six) hours as needed for shortness of breath or wheezing. 05/18/20   [provider]  azaTHIOprine (IMURAN) 50 MG tablet Take 50 mg by mouth 2 (two) times daily.    [provider]  Cholecalciferol 50 MCG (2000 UT) CAPS Take 2,000 Units by mouth every evening.    [provider]  cyclobenzaprine (FLEXERIL) 10 MG tablet Take 10 mg by mouth 3 (three) times daily as needed for muscle spasms. 10/27/19   [provider]  docusate sodium (COLACE) 100 MG capsule Take 100 mg by mouth 2 (two) times daily as needed (constipation). 08/11/20   Ladell Pier, MD  gabapentin (NEURONTIN) 300 MG capsule Take 300 mg by mouth in the morning and at bedtime. 05/29/18   [provider]  HYDROcodone-acetaminophen (NORCO) 10-325 MG tablet Take 1 tablet by mouth every 6 (six) hours as needed for pain. 09/02/20   [provider]  hydroxychloroquine (PLAQUENIL) 200 MG tablet Take 200 mg by mouth 2 (two) times daily.    [provider]  metoCLOPramide (REGLAN) 10 MG tablet Take 1 tablet (10 mg total) by mouth 3 (three) times daily before meals. 04/27/20   Owens Shark, NP  omeprazole (PRILOSEC) 40 MG capsule Take 1 capsule (40 mg total) by mouth 2 (two) times daily before a meal for 60 doses. 08/26/20 09/25/20  Milus Banister, MD  ondansetron (ZOFRAN) 8 MG tablet Take 1 tablet (8 mg total) by mouth every 8 (eight)  hours as needed for nausea or vomiting. 05/26/20   Ladell Pier, MD  oxyCODONE-acetaminophen (PERCOCET) 5-325 MG tablet Take 1-2 tablets by mouth every 6 (six) hours as needed for severe pain. 08/04/20   Ladell Pier, MD  Polyethylene Glycol 3350 (MIRALAX PO) Take 17 g by mouth 2 (two) times daily as needed (constipation).    [provider]  potassium chloride (KLOR-CON) 10 MEQ tablet Take 10 mEq by mouth every evening. 11/10/14   [provider]  prochlorperazine (COMPAZINE) 10 MG tablet Take 10 mg by mouth every 6 (six) hours as needed for nausea or vomiting.  10/23/17   [provider]  sucralfate (CARAFATE) 1 GM/10ML suspension Take 10 mLs (1 g total) by mouth 4 (four) times daily. 08/26/20 08/26/21  Milus Banister, MD  traMADol (ULTRAM) 50 MG tablet Take 1 tablet (50 mg total) by mouth every 8 (eight) hours as needed for pain. 07/30/12   Dene Gentry, MD    Physical Exam: Vitals:   09/08/20 1230 09/08/20 1250 09/08/20 1300 09/08/20 1330  BP: 109/69 94/78 103/79 101/75  Pulse: (!) 125 70 (!) 120 (!) 116  Resp: (!) 22 17 18  (!) 24  Temp:      TempSrc:      SpO2: 98% 96% 100% 91%    General: 55 y.o. ill appearing female resting in bed Eyes: PERRL, normal sclera ENMT: Nares patent w/o discharge, orophaynx clear, dentition normal, ears w/o discharge/lesions/ulcers Neck: Supple, trachea midline Cardiovascular: tachy, +S1, S2, no m/g/r, equal pulses throughout Respiratory: CTABL, no w/r/r, normal WOB GI: BS+, NDNT, no masses noted, no organomegaly noted MSK: No e/c/c Skin: No rashes, bruises, ulcerations noted Neuro: A&O x 3, no focal deficits Psyc: Appropriate interaction and affect, calm/cooperative  Labs on Admission: I have personally reviewed following labs and imaging studies  CBC: Recent Labs  Lab 09/08/20 1213  WBC 12.1*  NEUTROABS 9.7*  HGB 7.3*  HCT 25.2*  MCV 93.3  PLT 161   Basic Metabolic Panel: Recent Labs  Lab 09/08/20 1213   NA 135  K 3.9  CL 103  CO2 22  GLUCOSE 185*  BUN 17  CREATININE 0.76  CALCIUM 8.4*   GFR: Estimated Creatinine Clearance: 80.8 mL/min (by C-G formula based on SCr of 0.76 mg/dL). Liver Function Tests: Recent Labs  Lab 09/08/20 1213  AST 24  ALT 8  ALKPHOS 101  BILITOT 0.2*  PROT 6.7  ALBUMIN 2.9*   Recent Labs  Lab 09/08/20 1213  LIPASE 17   No results for input(s): AMMONIA in the last 168 hours. Coagulation Profile: No results for input(s): INR, PROTIME in the last 168 hours. Cardiac Enzymes: No results for input(s): CKTOTAL, CKMB, CKMBINDEX, TROPONINI in the last 168 hours. BNP (last 3 results)  No results for input(s): PROBNP in the last 8760 hours. HbA1C: No results for input(s): HGBA1C in the last 72 hours. CBG: Recent Labs  Lab 09/08/20 1159  GLUCAP 117*   Lipid Profile: No results for input(s): CHOL, HDL, LDLCALC, TRIG, CHOLHDL, LDLDIRECT in the last 72 hours. Thyroid Function Tests: No results for input(s): TSH, T4TOTAL, FREET4, T3FREE, THYROIDAB in the last 72 hours. Anemia Panel: No results for input(s): VITAMINB12, FOLATE, FERRITIN, TIBC, IRON, RETICCTPCT in the last 72 hours. Urine analysis:    Component Value Date/Time   COLORURINE AMBER (A) 10/22/2019 1000   APPEARANCEUR CLOUDY (A) 10/22/2019 1000   LABSPEC 1.024 10/22/2019 1000   PHURINE 5.0 10/22/2019 1000   GLUCOSEU NEGATIVE 10/22/2019 1000   HGBUR LARGE (A) 10/22/2019 1000   BILIRUBINUR NEGATIVE 10/22/2019 1000   KETONESUR NEGATIVE 10/22/2019 1000   PROTEINUR 100 (A) 03/02/2020 0950   NITRITE NEGATIVE 10/22/2019 1000   LEUKOCYTESUR SMALL (A) 10/22/2019 1000    Radiological Exams on Admission: DG Chest Port 1 View  Result Date: 09/08/2020 CLINICAL DATA:  Shortness of breath.  Syncopal episodes. EXAM: PORTABLE CHEST 1 VIEW COMPARISON:  09/07/2019 FINDINGS: Stable appearance of the left subclavian Port-A-Cath with the tip extending into the upper right atrium. There is a chronic kink  in the port near the inferior aspect of the left clavicle. Lungs are clear. Heart size is normal. Trachea is midline. No acute bone abnormality. IMPRESSION: 1. No acute cardiopulmonary disease. 2. Stable appearance of the left subclavian port with a kink in the catheter near the left clavicle. 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 Electronically Signed   By: Markus Daft M.D.   On: 09/08/2020 13:00    EKG: Independently reviewed. Sinus tach, no st elevations; shows prolonged qt  Assessment/Plan Symptomatic anemia Near syncocpe     - admit to inpt, SDU     - likely from blood loss     - will get transfused 1 unit pRBCs, sending iron labs (weren't sent in ED so may be off)     - check echo, orthostatics     - elevated trp noted, see below  SIRS Lactic acidosis     - no source identified     - lactic acid is up, continue fluids and trend lactic acid     - WBC up to 12, UA is pending     - she denies any fevers, respiratory symptoms, urinary symptoms, diarrhea, sick contacts     - rpt lactic acid is improved     - check blood cultures     - will start vanc, cefepime     - in addition, will check V/Q scan (contrast shortage, so VQ ordered)  Elevated Troponin     - 260 -> 448; denies CP, EKG shows sinus tach w/ prolonged QT, no st elevations seen     - cards consulted, follow trp  Hyperglycemia     - denies Hx of DM; check A1c, follow  Prolonged Qt     - K+ is good, check Mg2+, place on tele  GIB Hx of gastric cancer     - recent EGD showed 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 Ardis Hughs) about her current presentation, they recommend blood transfusion, IV iron and hospice; no further GI intervention planned at this time     - consult hospice for goals of care  DVT prophylaxis: SCDs  Code Status: FULL Family Communication: w/ family at bedside  Consults called: None  Status is:  Inpatient  Remains inpatient appropriate because:Inpatient level of care appropriate due to severity of illness   Dispo: The patient is from: Home              Anticipated d/c is to: Home              Patient currently is not medically stable to d/c.   Difficult to place patient No  Time spent coordinating admission: 80 minutes  [facility]  If 7PM-7AM, please contact night-coverage [email or web address]  09/08/2020, 2:03 PM

## 2020-09-08 NOTE — Progress Notes (Addendum)
MD made aware of patient stating she is unable to stand for the orthostatic VS at this time. Also aware of critical Trop level 448.

## 2020-09-09 ENCOUNTER — Inpatient Hospital Stay (HOSPITAL_COMMUNITY): Payer: Medicare Other

## 2020-09-09 ENCOUNTER — Encounter (HOSPITAL_COMMUNITY)
Admission: EM | Disposition: A | Discharge status: Home Health | Payer: Self-pay | Source: Home / Self Care | Attending: Internal Medicine

## 2020-09-09 ENCOUNTER — Inpatient Hospital Stay (HOSPITAL_COMMUNITY): Payer: Medicare Other | Admitting: Anesthesiology

## 2020-09-09 ENCOUNTER — Encounter (HOSPITAL_COMMUNITY): Payer: Self-pay | Admitting: Internal Medicine

## 2020-09-09 ENCOUNTER — Encounter: Payer: Self-pay | Admitting: Oncology

## 2020-09-09 DIAGNOSIS — R531 Weakness: Secondary | ICD-10-CM | POA: Diagnosis not present

## 2020-09-09 DIAGNOSIS — I2699 Other pulmonary embolism without acute cor pulmonale: Secondary | ICD-10-CM | POA: Diagnosis not present

## 2020-09-09 DIAGNOSIS — C168 Malignant neoplasm of overlapping sites of stomach: Secondary | ICD-10-CM

## 2020-09-09 DIAGNOSIS — R778 Other specified abnormalities of plasma proteins: Secondary | ICD-10-CM

## 2020-09-09 DIAGNOSIS — Z515 Encounter for palliative care: Secondary | ICD-10-CM

## 2020-09-09 DIAGNOSIS — I248 Other forms of acute ischemic heart disease: Secondary | ICD-10-CM

## 2020-09-09 DIAGNOSIS — D62 Acute posthemorrhagic anemia: Secondary | ICD-10-CM | POA: Diagnosis not present

## 2020-09-09 DIAGNOSIS — N179 Acute kidney failure, unspecified: Secondary | ICD-10-CM

## 2020-09-09 DIAGNOSIS — Z7189 Other specified counseling: Secondary | ICD-10-CM | POA: Diagnosis not present

## 2020-09-09 DIAGNOSIS — D649 Anemia, unspecified: Secondary | ICD-10-CM | POA: Diagnosis not present

## 2020-09-09 HISTORY — PX: IR ANGIOGRAM PULMONARY BILATERAL SELECTIVE: IMG664

## 2020-09-09 HISTORY — PX: IR FLUORO GUIDE CV LINE RIGHT: IMG2283

## 2020-09-09 HISTORY — PX: IR THROMBECT PRIM MECH INIT (INCLU) MOD SED: IMG2297

## 2020-09-09 HISTORY — PX: IR US GUIDE VASC ACCESS RIGHT: IMG2390

## 2020-09-09 HISTORY — PX: RADIOLOGY WITH ANESTHESIA: SHX6223

## 2020-09-09 LAB — CBC WITH DIFFERENTIAL/PLATELET
Abs Immature Granulocytes: 0.07 10*3/uL (ref 0.00–0.07)
Abs Immature Granulocytes: 0.08 10*3/uL — ABNORMAL HIGH (ref 0.00–0.07)
Basophils Absolute: 0 10*3/uL (ref 0.0–0.1)
Basophils Absolute: 0 10*3/uL (ref 0.0–0.1)
Basophils Relative: 0 %
Basophils Relative: 0 %
Eosinophils Absolute: 0.1 10*3/uL (ref 0.0–0.5)
Eosinophils Absolute: 0.1 10*3/uL (ref 0.0–0.5)
Eosinophils Relative: 1 %
Eosinophils Relative: 1 %
HCT: 31.6 % — ABNORMAL LOW (ref 36.0–46.0)
HCT: 27 % — ABNORMAL LOW (ref 36.0–46.0)
Hemoglobin: 10 g/dL — ABNORMAL LOW (ref 12.0–15.0)
Hemoglobin: 8.8 g/dL — ABNORMAL LOW (ref 12.0–15.0)
Immature Granulocytes: 1 %
Immature Granulocytes: 1 %
Lymphocytes Relative: 12 %
Lymphocytes Relative: 11 %
Lymphs Abs: 1.1 10*3/uL (ref 0.7–4.0)
Lymphs Abs: 0.9 10*3/uL (ref 0.7–4.0)
MCH: 29.2 pg (ref 26.0–34.0)
MCH: 29.3 pg (ref 26.0–34.0)
MCHC: 31.6 g/dL (ref 30.0–36.0)
MCHC: 32.6 g/dL (ref 30.0–36.0)
MCV: 92.4 fL (ref 80.0–100.0)
MCV: 90 fL (ref 80.0–100.0)
Monocytes Absolute: 0.8 10*3/uL (ref 0.1–1.0)
Monocytes Absolute: 0.8 10*3/uL (ref 0.1–1.0)
Monocytes Relative: 8 %
Monocytes Relative: 9 %
Neutro Abs: 7.3 10*3/uL (ref 1.7–7.7)
Neutro Abs: 6.4 10*3/uL (ref 1.7–7.7)
Neutrophils Relative %: 78 %
Neutrophils Relative %: 78 %
Platelets: 124 10*3/uL — ABNORMAL LOW (ref 150–400)
Platelets: 121 10*3/uL — ABNORMAL LOW (ref 150–400)
RBC: 3.42 MIL/uL — ABNORMAL LOW (ref 3.87–5.11)
RBC: 3 MIL/uL — ABNORMAL LOW (ref 3.87–5.11)
RDW: 17.8 % — ABNORMAL HIGH (ref 11.5–15.5)
RDW: 17.8 % — ABNORMAL HIGH (ref 11.5–15.5)
WBC: 9.3 10*3/uL (ref 4.0–10.5)
WBC: 8.2 10*3/uL (ref 4.0–10.5)
nRBC: 2.6 % — ABNORMAL HIGH (ref 0.0–0.2)
nRBC: 2.6 % — ABNORMAL HIGH (ref 0.0–0.2)

## 2020-09-09 LAB — CBC
HCT: 24.4 % — ABNORMAL LOW (ref 36.0–46.0)
Hemoglobin: 7.5 g/dL — ABNORMAL LOW (ref 12.0–15.0)
MCH: 28.2 pg (ref 26.0–34.0)
MCHC: 30.7 g/dL (ref 30.0–36.0)
MCV: 91.7 fL (ref 80.0–100.0)
Platelets: 133 10*3/uL — ABNORMAL LOW (ref 150–400)
RBC: 2.66 MIL/uL — ABNORMAL LOW (ref 3.87–5.11)
RDW: 19.5 % — ABNORMAL HIGH (ref 11.5–15.5)
WBC: 8.8 10*3/uL (ref 4.0–10.5)
nRBC: 2.7 % — ABNORMAL HIGH (ref 0.0–0.2)

## 2020-09-09 LAB — COMPREHENSIVE METABOLIC PANEL
ALT: 8 U/L (ref 0–44)
AST: 18 U/L (ref 15–41)
Albumin: 2.4 g/dL — ABNORMAL LOW (ref 3.5–5.0)
Alkaline Phosphatase: 79 U/L (ref 38–126)
Anion gap: 9 (ref 5–15)
BUN: 14 mg/dL (ref 6–20)
CO2: 20 mmol/L — ABNORMAL LOW (ref 22–32)
Calcium: 7.6 mg/dL — ABNORMAL LOW (ref 8.9–10.3)
Chloride: 107 mmol/L (ref 98–111)
Creatinine, Ser: 0.36 mg/dL — ABNORMAL LOW (ref 0.44–1.00)
GFR, Estimated: >60 mL/min (ref >60)
Glucose, Bld: 96 mg/dL (ref 70–99)
Potassium: 3.9 mmol/L (ref 3.5–5.1)
Sodium: 136 mmol/L (ref 135–145)
Total Bilirubin: 0.9 mg/dL (ref 0.3–1.2)
Total Protein: 5.5 g/dL — ABNORMAL LOW (ref 6.5–8.1)

## 2020-09-09 LAB — ECHOCARDIOGRAM COMPLETE
Area-P 1/2: 6.22 cm2
S' Lateral: 2.4 cm
Single Plane A4C EF: 56.4 %
Weight: 2631.41 oz

## 2020-09-09 LAB — GLUCOSE, CAPILLARY
Glucose-Capillary: 50 mg/dL — ABNORMAL LOW (ref 70–99)
Glucose-Capillary: 97 mg/dL (ref 70–99)

## 2020-09-09 LAB — APTT: aPTT: >200 seconds (ref 24–36)

## 2020-09-09 LAB — PREPARE RBC (CROSSMATCH)

## 2020-09-09 LAB — HEPARIN LEVEL (UNFRACTIONATED)
Heparin Unfractionated: 0.4 IU/mL (ref 0.30–0.70)
Heparin Unfractionated: >1.1 IU/mL — ABNORMAL HIGH (ref 0.30–0.70)

## 2020-09-09 LAB — PROTIME-INR
INR: 1 (ref 0.8–1.2)
Prothrombin Time: 13.6 seconds (ref 11.4–15.2)

## 2020-09-09 LAB — HEMOGLOBIN AND HEMATOCRIT, BLOOD
HCT: 25.2 % — ABNORMAL LOW (ref 36.0–46.0)
Hemoglobin: 7.6 g/dL — ABNORMAL LOW (ref 12.0–15.0)

## 2020-09-09 SURGERY — IR WITH ANESTHESIA
Anesthesia: Monitor Anesthesia Care

## 2020-09-09 MED ORDER — HEPARIN BOLUS VIA INFUSION
INTRAVENOUS | Status: AC | PRN
  Administered 2020-09-09: 5000 [IU] via INTRAVENOUS

## 2020-09-09 MED ORDER — LIDOCAINE HCL 1 % IJ SOLN
INTRAMUSCULAR | Status: AC | PRN
  Administered 2020-09-09: 5 mL

## 2020-09-09 MED ORDER — KETAMINE HCL 50 MG/5ML IJ SOSY
PREFILLED_SYRINGE | INTRAMUSCULAR | Status: AC
  Filled 2020-09-09: qty 5

## 2020-09-09 MED ORDER — SODIUM CHLORIDE 0.9% IV SOLUTION: Freq: Once | INTRAVENOUS | Status: AC

## 2020-09-09 MED ORDER — HEPARIN SODIUM (PORCINE) 1000 UNIT/ML IJ SOLN
INTRAMUSCULAR | Status: DC | PRN
  Administered 2020-09-09: 5000 [IU] via INTRAVENOUS

## 2020-09-09 MED ORDER — DEXMEDETOMIDINE HCL IN NACL 200 MCG/50ML IV SOLN
INTRAVENOUS | Status: DC | PRN
  Administered 2020-09-09: .2 ug/kg/h via INTRAVENOUS

## 2020-09-09 MED ORDER — IOHEXOL 350 MG/ML SOLN
100.0000 mL | Freq: Once | INTRAVENOUS | Status: AC | PRN
  Administered 2020-09-09: 100 mL via INTRAVENOUS

## 2020-09-09 MED ORDER — DEXMEDETOMIDINE (PRECEDEX) IN NS 20 MCG/5ML (4 MCG/ML) IV SYRINGE
PREFILLED_SYRINGE | INTRAVENOUS | Status: DC | PRN
  Administered 2020-09-09: 2 ug via INTRAVENOUS
  Administered 2020-09-09: 4 ug via INTRAVENOUS

## 2020-09-09 MED ORDER — LACTATED RINGERS IV SOLN: INTRAVENOUS | Status: DC | PRN

## 2020-09-09 MED ORDER — LIDOCAINE HCL (CARDIAC) PF 100 MG/5ML IV SOSY
PREFILLED_SYRINGE | INTRAVENOUS | Status: DC | PRN
  Administered 2020-09-09: 60 mg via INTRATRACHEAL

## 2020-09-09 MED ORDER — SODIUM CHLORIDE 0.9% IV SOLUTION: Freq: Once | INTRAVENOUS | Status: DC

## 2020-09-09 MED ORDER — TECHNETIUM TO 99M ALBUMIN AGGREGATED
4.2500 | Freq: Once | INTRAVENOUS | Status: AC
  Administered 2020-09-09: 4.25 via INTRAVENOUS

## 2020-09-09 MED ORDER — IOHEXOL 300 MG/ML  SOLN
50.0000 mL | Freq: Once | INTRAMUSCULAR | Status: AC | PRN
  Administered 2020-09-09: 25 mL via INTRAVENOUS

## 2020-09-09 MED ORDER — HEPARIN (PORCINE) 25000 UT/250ML-% IV SOLN
INTRAVENOUS | Status: DC | PRN
  Administered 2020-09-09 (×2): 1150 [IU]/h via INTRAVENOUS

## 2020-09-09 MED ORDER — HEPARIN (PORCINE) 25000 UT/250ML-% IV SOLN
1000.0000 [IU]/h | INTRAVENOUS | Status: DC
  Administered 2020-09-09: 1150 [IU]/h via INTRAVENOUS
  Administered 2020-09-10: 1100 [IU]/h via INTRAVENOUS
  Filled 2020-09-09 (×2): qty 250

## 2020-09-09 MED ORDER — ORAL CARE MOUTH RINSE
15.0000 mL | Freq: Two times a day (BID) | OROMUCOSAL | Status: DC
  Administered 2020-09-10 – 2020-09-12 (×5): 15 mL via OROMUCOSAL

## 2020-09-09 MED ORDER — CHLORHEXIDINE GLUCONATE CLOTH 2 % EX PADS
6.0000 | MEDICATED_PAD | Freq: Every day | CUTANEOUS | Status: DC
  Administered 2020-09-09 – 2020-09-14 (×5): 6 via TOPICAL

## 2020-09-09 MED ORDER — CHLORHEXIDINE GLUCONATE 0.12 % MT SOLN
15.0000 mL | Freq: Two times a day (BID) | OROMUCOSAL | Status: DC
  Administered 2020-09-09 – 2020-09-13 (×7): 15 mL via OROMUCOSAL
  Filled 2020-09-09 (×6): qty 15

## 2020-09-09 MED ORDER — IOHEXOL 300 MG/ML  SOLN
100.0000 mL | Freq: Once | INTRAMUSCULAR | Status: AC | PRN
  Administered 2020-09-09: 50 mL via INTRAVENOUS

## 2020-09-09 MED ORDER — SODIUM CHLORIDE 0.9 % IV SOLN: INTRAVENOUS | Status: DC | PRN

## 2020-09-09 MED ORDER — DEXMEDETOMIDINE HCL IN NACL 400 MCG/100ML IV SOLN
0.4000 ug/kg/h | INTRAVENOUS | Status: DC
  Administered 2020-09-09: 0.2 ug/kg/h via INTRAVENOUS
  Filled 2020-09-09: qty 100

## 2020-09-09 MED ORDER — LIDOCAINE HCL 1 % IJ SOLN
INTRAMUSCULAR | Status: AC
  Filled 2020-09-09: qty 20

## 2020-09-09 NOTE — Progress Notes (Signed)
This RN received a verbal order per Dr. Dwaine Gale for a repeat CBC, Type and screen, and to have 2 units of PRBC ready for administration by CRNA. Previous hgb 7.5. Orders have been placed.

## 2020-09-09 NOTE — Consult Note (Addendum)
Cardiology Consultation:   Patient ID: Michelle Cain; [id number]; 11-06-1965   Admit date: 09/08/2020 Date of Consult: 09/09/2020  Primary Care Provider: Laurette Schimke, DO Primary Cardiologist: New to [facility]   Patient Profile:   Michelle Cain is a 55 y.o. female with a hx of gastric cancer, neuropathy and chronic nausea who is being seen today for the evaluation of elevated troponin at the request of Dr. Marylyn Ishihara.  History of Present Illness:   Michelle Cain is a 55 year old female with a history stated above who presented to [facility] after a near syncopal episode.  Patient reportedly woke up yesterday morning and went to the bathroom at which time she began feeling flushed and called her mother for assistance. On her arrival, patient was slumped over the sink and was very sluggish. This reportedly went on for approximately 6 minutes at which time family members were able to transport her to the ED for further evaluation. She states that she has been having issues with chronic vomiting which has somewhat worsened over the last several weeks. She follows with Dr. Benay Spice for her gastric cancer and recently underwent an EGD 08/26/20 that showed near complete malignant transformation of the gastric mucosa. She has now been referred to [facility] for further workup. She states that she had an episode of chest pain last week that was near her bra line. This occurred with movement and relieved with rest however there was no radiation and no associated symptoms. She recently had a chest CT with no evidence of coronary or aortic calcifications. She denies personal or family hx of CAD. She denies recent issues with SOB, PND, LE edema, or palpitations.    On ED arrival, hemoglobin noted to be 7.5 therefore she received 1 unit PRBCs. Lactic acid elevated at 2.6 with an elevated WBC at 12.  She was started on empiric antibiotics.  Presyncopal episode felt to be secondary to anemia and she was found to be  profoundly dehydrated due to vomiting. Plan was to obtain an echocardiogram and orthostatics.  HST found to be elevated at 260 with a repeat at 448 with no anginal symptoms. Given this, cardiology has been asked to follow for potential further work-up.  Past Medical History:  Diagnosis Date  . Anemia   . Arthritis   . Breast CA ([place]) dx'd 2007   left  . Carpal tunnel syndrome on both sides   . DVT (deep venous thrombosis) (HCC)    Left leg  . Dyspnea    with activity,   . Fibromyalgia   . History of blood transfusion 08/17/2020  . Lupus ([place])   . Pneumonia    History of  . Raynaud disease   . stomach/colon ca dx'd 2020    Past Surgical History:  Procedure Laterality Date  . BREAST SURGERY Left    with lymph nodes  . CHOLECYSTECTOMY    . ESOPHAGOGASTRODUODENOSCOPY (EGD) WITH PROPOFOL N/A 08/26/2020   Procedure: ESOPHAGOGASTRODUODENOSCOPY (EGD) WITH PROPOFOL;  Surgeon: Milus Banister, MD;  Location: [facility];  Service: Endoscopy;  Laterality: N/A;  . UPPER GI ENDOSCOPY       Prior to Admission medications   Medication Sig Start Date End Date Taking? Authorizing Provider  azaTHIOprine (IMURAN) 50 MG tablet Take 50 mg by mouth 2 (two) times daily.   Yes [provider]  Cholecalciferol 50 MCG (2000 UT) CAPS Take 2,000 Units by mouth every evening.   Yes [provider]  docusate sodium (COLACE) 100 MG capsule  Take 100 mg by mouth 2 (two) times daily as needed (constipation). 08/11/20  Yes Ladell Pier, MD  gabapentin (NEURONTIN) 300 MG capsule Take 300 mg by mouth in the morning and at bedtime. 05/29/18  Yes [provider]  HYDROcodone-acetaminophen (NORCO) 10-325 MG tablet Take 1 tablet by mouth every 6 (six) hours as needed for pain. 09/02/20  Yes [provider]  hydroxychloroquine (PLAQUENIL) 200 MG tablet Take 200 mg by mouth 2 (two) times daily.   Yes [provider]  metoCLOPramide (REGLAN) 10 MG tablet Take 1 tablet (10  mg total) by mouth 3 (three) times daily before meals. 04/27/20  Yes Owens Shark, NP  omeprazole (PRILOSEC) 40 MG capsule Take 1 capsule (40 mg total) by mouth 2 (two) times daily before a meal for 60 doses. 08/26/20 09/25/20 Yes Milus Banister, MD  ondansetron (ZOFRAN) 8 MG tablet Take 1 tablet (8 mg total) by mouth every 8 (eight) hours as needed for nausea or vomiting. 05/26/20  Yes Ladell Pier, MD  oxyCODONE-acetaminophen (PERCOCET) 5-325 MG tablet Take 1-2 tablets by mouth every 6 (six) hours as needed for severe pain. 08/04/20  Yes Ladell Pier, MD  Polyethylene Glycol 3350 (MIRALAX PO) Take 17 g by mouth 2 (two) times daily as needed (constipation).   Yes [provider]  potassium chloride (KLOR-CON) 10 MEQ tablet Take 10 mEq by mouth every evening. 11/10/14  Yes [provider]  sucralfate (CARAFATE) 1 GM/10ML suspension Take 10 mLs (1 g total) by mouth 4 (four) times daily. 08/26/20 08/26/21 Yes Milus Banister, MD  traMADol (ULTRAM) 50 MG tablet Take 1 tablet (50 mg total) by mouth every 8 (eight) hours as needed for pain. 07/30/12  Yes Hudnall, Sharyn Lull, MD    Inpatient Medications: Scheduled Meds: . sodium chloride   Intravenous Once  . chlorhexidine  15 mL Mouth Rinse BID  . Chlorhexidine Gluconate Cloth  6 each Topical Daily  . dronabinol  2.5 mg Oral BID  . feeding supplement  1 Container Oral TID BM  . mouth rinse  15 mL Mouth Rinse q12n4p  . pantoprazole (PROTONIX) IV  40 mg Intravenous Q12H   Continuous Infusions: . ceFEPime (MAXIPIME) IV Stopped (09/09/20 0138)  . lactated ringers 125 mL/hr at 09/09/20 0036  . vancomycin     PRN Meds: acetaminophen **OR** acetaminophen, ondansetron (ZOFRAN) IV, oxyCODONE, promethazine  Allergies:   No Known Allergies  Social History:   Social History   Socioeconomic History  . Marital status: Divorced    Spouse name: Not on file  . Number of children: Not on file  . Years of education: Not on file  .  Highest education level: Not on file  Occupational History  . Not on file  Tobacco Use  . Smoking status: Never Smoker  . Smokeless tobacco: Never Used  Vaping Use  . Vaping Use: Never used  Substance and Sexual Activity  . Alcohol use: No  . Drug use: No  . Sexual activity: Yes    Birth control/protection: Post-menopausal  Other Topics Concern  . Not on file  Social History Narrative  . Not on file   Social Determinants of Health   Financial Resource Strain: Not on file  Food Insecurity: Not on file  Transportation Needs: Not on file  Physical Activity: Not on file  Stress: Not on file  Social Connections: Not on file  Intimate Partner Violence: Not on file    Family History:  Family History  Problem Relation Age of Onset  . Sudden death Neg Hx   . Hypertension Neg Hx   . Hyperlipidemia Neg Hx   . Heart attack Neg Hx   . Diabetes Neg Hx    Family Status:  Family Status  Relation Name Status  . Neg Hx  (Not Specified)    ROS:  Please see the history of present illness.  All other ROS reviewed and negative.     Physical Exam/Data:   Vitals:   09/09/20 0100 09/09/20 0200 09/09/20 0300 09/09/20 0400  BP: 111/67 105/77 (!) 107/37 91/62  Pulse: (!) 115 (!) 113 (!) 115 (!) 114  Resp: (!) 27 (!) 27 (!) 35 (!) 27  Temp:   98.7 F (37.1 C)   TempSrc:   Oral   SpO2: 99% 96% 95% 94%  Weight:        Intake/Output Summary (Last 24 hours) at 09/09/2020 0843 Last data filed at 09/09/2020 0400 Gross per 24 hour  Intake 4335.56 ml  Output --  Net 4335.56 ml   Filed Weights   09/08/20 1526  Weight: 74.6 kg   Body mass index is 28.23 kg/m.   General: Ill appearing, NAD Neck: Negative for carotid bruits. No JVD Lungs:Clear to ausculation bilaterally. No wheezes, rales, or rhonchi. Breathing is unlabored. Cardiovascular: RRR with S1 S2. No murmurs Abdomen: Soft, non-tender, non-distended. No obvious abdominal masses. Extremities: No edema. Radial pulses 2+  bilaterally Neuro: Alert and oriented. No focal deficits. No facial asymmetry. MAE spontaneously. Psych: Responds to questions appropriately with normal affect.     EKG:  The EKG was personally reviewed and demonstrates: 09/09/20 ST with HR 106bpm and no acute ST/T wave changes  Telemetry:  Telemetry was personally reviewed and demonstrates: 09/09/20 ST with HRs in the low 100's   Relevant CV Studies:  ECHO: pending   Laboratory Data:  Chemistry Recent Labs  Lab 09/08/20 1213 09/09/20 0330  NA 135 136  K 3.9 3.9  CL 103 107  CO2 22 20*  GLUCOSE 185* 96  BUN 17 14  CREATININE 0.76 0.36*  CALCIUM 8.4* 7.6*  GFRNONAA >60 >60  ANIONGAP 10 9    Total Protein  Date Value Ref Range Status  09/09/2020 5.5 (L) 6.5 - 8.1 g/dL Final   Albumin  Date Value Ref Range Status  09/09/2020 2.4 (L) 3.5 - 5.0 g/dL Final   AST  Date Value Ref Range Status  09/09/2020 18 15 - 41 U/L Final  09/01/2020 16 15 - 41 U/L Final   ALT  Date Value Ref Range Status  09/09/2020 8 0 - 44 U/L Final  09/01/2020 7 0 - 44 U/L Final   Alkaline Phosphatase  Date Value Ref Range Status  09/09/2020 79 38 - 126 U/L Final   Total Bilirubin  Date Value Ref Range Status  09/09/2020 0.9 0.3 - 1.2 mg/dL Final  09/01/2020 0.4 0.3 - 1.2 mg/dL Final   Hematology Recent Labs  Lab 09/08/20 1213 09/09/20 0330  WBC 12.1* 8.8  RBC 2.70* 2.66*  HGB 7.3* 7.5*  HCT 25.2* 24.4*  MCV 93.3 91.7  MCH 27.0 28.2  MCHC 29.0* 30.7  RDW 20.6* 19.5*  PLT 184 133*   Cardiac EnzymesNo results for input(s): TROPONINI in the last 168 hours. No results for input(s): TROPIPOC in the last 168 hours.  BNPNo results for input(s): BNP, PROBNP in the last 168 hours.  DDimer No results for input(s): DDIMER in the last 168 hours. TSH:  Lab Results  Component Value Date   TSH 5.393 (H) 02/04/2020   Lipids:No results found for: CHOL, HDL, LDLCALC, LDLDIRECT, TRIG, CHOLHDL HgbA1c:No results found for:  HGBA1C  Radiology/Studies:  DG Chest Port 1 View  Result Date: 09/08/2020 CLINICAL DATA:  Shortness of breath.  Syncopal episodes. EXAM: PORTABLE CHEST 1 VIEW COMPARISON:  09/07/2019 FINDINGS: Stable appearance of the left subclavian Port-A-Cath with the tip extending into the upper right atrium. There is a chronic kink in the port near the inferior aspect of the left clavicle. Lungs are clear. Heart size is normal. Trachea is midline. No acute bone abnormality. IMPRESSION: 1. No acute cardiopulmonary disease. 2. Stable appearance of the left subclavian port with a kink in the catheter near the left clavicle. 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 Electronically Signed   By: Markus Daft M.D.   On: 09/08/2020 13:00   Assessment and Plan:   1.  Elevated troponin: -Patient presented after a near syncopal episode which occurred in her bathroom felt to be secondary to anemia with a hemoglobin at 7.5.  She received 1 unit PRBCs per primary team.  High-sensitivity troponin levels found to be elevated at 260>>448.  EKG on presentation with sinus tachycardia no ST/T wave acute findings. -She has no prior history of CAD, no reports of chest pain, and has never been evaluated by cardiology. Chest CT from 08/16/2020 with no evidence of atherosclerosis or coronary calcification -Presentation consistent with demand ischemia in setting of pulmonary embolism and anemia.  No wall motion abnormalities on echocardiogram to suggest acute coronary syndrome -No ischemic evaluation recommended  2.  Gastric cancer with GI bleed: -Recent EGD per GI showed near complete malignant transformation of the gastric mucosa>>> GI consulted on patient presentation with recommendations for blood transfusion, IV iron and hospice consultation with no further plans for GI intervention -Follows with Dr. Benay Spice and is in the process of referral to [facility] for further workup as she has undergone  RXT/chemo with no improvement  -Needs palliative/hospice consultation for goals of care discussion per chart review  3.  Near syncope: -Felt to be secondary from blood loss/PE therefore she was transfused with 1 unit PRBCs  4. Tachycardia: -Likely due to anemia/PE   For questions or updates, please contact [place] Please consult [email or web address] for contact info under Cardiology/STEMI.   Lyndel Safe NP-C [facility] Pager: [phone] 09/09/2020 8:43 AM  Patient seen and examined.  Agree with above documentation.  Ms. Radabaugh is a 55 year old female with a history of gastric cancer who we are consulted by Dr. Marylyn Ishihara for evaluation of troponin elevation.  She follows with Dr. Benay Spice for her gastric cancer.a.  She has been referred to [facility] for further evaluation.  She had a near syncopal episode yesterday after waking up and walking to the bathroom.  She called her mother who found her slumped over in the bathroom.  EMS was called and she was transported to the ED.  On presentation to the ED, initial vital signs notable for BP 91/74, pulse 128, SPO2 98% on room air.  Labs notable for creatinine 0.8, lactate 5.4, WBC 12.1, hemoglobin 7.3 (down from 9.1 on 5/18), troponin 260 > 448.  EKG shows sinus tachycardia, rate 114, nonspecific T wave flattening.  Echocardiogram shows severe RV dilatation and moderately reduced RV function, LVEF 60 to 65%, interventricular septal flattening consistent with RV pressure and volume overload, no significant valvular disease.  VQ scan  shows multiple bilateral pulmonary perfusion defects consistent with pulmonary embolism.  Telemetry shows sinus tachycardia, rate 110-120s. On exam, patient is alert and oriented, tachycardia, regular rhythm, lungs CTAB, no LE edema.  For troponin elevation, suspect demand ischemia in setting of anemia and right heart strain due to pulmonary embolism.  No LV wall motion abnormalities on echocardiogram to suggest acute  coronary syndrome and she denies any chest pain.  No further cardiac work-up recommended.  For pulmonary embolism, complicated situation in setting of gastric cancer with ongoing GI bleed.  Plan per IR is transfer to [facility] for thrombectomy.  Donato Heinz, MD

## 2020-09-09 NOTE — Sedation Documentation (Addendum)
1st pressure Pulmonary Artery : 39/11 (23)

## 2020-09-09 NOTE — Consult Note (Signed)
Chief Complaint: Patient was seen in consultation today for PE/mechanical thrombectomy.  Referring Physician(s): Kayleen Memos [facility])  Supervising Physician: Mir, Sharen Heck  Patient Status: [facility] - In-pt  History of Present Illness: Michelle Cain is a 55 y.o. female with a past medical history of DVT, pneumonia, gastric cancer, lupus, fibromyalgia, anemia, arthritis, and raynaud disease. She presented to [facility] ED 09/08/2020 secondary to dyspnea and near-syncopal event. In summary, patient woke up that AM and was dyspneic/flushed, was in bathroom, and "slumped over sink". She was weak and was sluggish to answer questions for approximately 6 minutes. She did not lose consciousness during this event. In ED, patient noted to have drop in hgb, FOBT positive- she was given 1U PRBC and admitted for further management. VQ scan positive for bilateral PE, echo with evidence of right heart strain. She was started on heparin gtt and given 2 additional units PRBC. CCM, GI, cardiology, palliative care were all consulted. CTA chest confirmed above (acute submassive PE, right heart strain). [place] LE positive for age indeterminate left DVT. Given bleeding metastatic gastric cancer, submassive PE, and right heart strain, IR has been consulted for possible mechanical thrombectomy.  CTA chest today: 1. Positive for acute PE with CT evidence of right heart strain (RV/LV Ratio = 1.41) consistent with at least submassive (intermediate risk) PE. The presence of right heart strain has been associated with an increased risk of morbidity and mortality. Please refer to the "PE Focused" order set in EPIC. 2. Small amount of ground-glass attenuation and septal thickening in the lower lobes of the lungs bilaterally. The possibility of areas of mild alveolar hemorrhage is not excluded, although most of this is likely atelectatic. 3. Trace left pleural effusion.  NM pulmonary perfusion today: 1. Multiple BILATERAL pulmonary  perfusion defects consistent with pulmonary embolism.  IR consulted by Dr. Nevada Crane for possible image-guided pulmonary angiogram with possible mechanical thrombectomy. Patient awake and alert laying in bed. Son, mother, and sister at bedside. Complains of dyspnea, stable since admission. Denies fever, chills, chest pain, abdominal pain, or headache.  On heparin gtt, receiving blood transfusion.   Past Medical History:  Diagnosis Date  . Anemia   . Arthritis   . Breast CA ([place]) dx'd 2007   left  . Carpal tunnel syndrome on both sides   . DVT (deep venous thrombosis) (H[facility])    Left leg  . Dyspnea    with activity,   . Fibromyalgia   . History of blood transfusion 08/17/2020  . Lupus ([place])   . Pneumonia    History of  . Raynaud disease   . stomach/colon ca dx'd 2020    Past Surgical History:  Procedure Laterality Date  . BREAST SURGERY Left    with lymph nodes  . CHOLECYSTECTOMY    . ESOPHAGOGASTRODUODENOSCOPY (EGD) WITH PROPOFOL N/A 08/26/2020   Procedure: ESOPHAGOGASTRODUODENOSCOPY (EGD) WITH PROPOFOL;  Surgeon: Milus Banister, MD;  Location: [facility] ENDOSCOPY;  Service: Endoscopy;  Laterality: N/A;  . UPPER GI ENDOSCOPY      Allergies: Patient has no known allergies.  Medications: Prior to Admission medications   Medication Sig Start Date End Date Taking? Authorizing Provider  azaTHIOprine (IMURAN) 50 MG tablet Take 50 mg by mouth 2 (two) times daily.   Yes [provider]  Cholecalciferol 50 MCG (2000 UT) CAPS Take 2,000 Units by mouth every evening.   Yes [provider]  docusate sodium (COLACE) 100 MG capsule Take 100 mg by mouth 2 (two) times daily as  needed (constipation). 08/11/20  Yes Ladell Pier, MD  gabapentin (NEURONTIN) 300 MG capsule Take 300 mg by mouth in the morning and at bedtime. 05/29/18  Yes [provider]  HYDROcodone-acetaminophen (NORCO) 10-325 MG tablet Take 1 tablet by mouth every 6 (six) hours as needed for pain.  09/02/20  Yes [provider]  hydroxychloroquine (PLAQUENIL) 200 MG tablet Take 200 mg by mouth 2 (two) times daily.   Yes [provider]  metoCLOPramide (REGLAN) 10 MG tablet Take 1 tablet (10 mg total) by mouth 3 (three) times daily before meals. 04/27/20  Yes Owens Shark, NP  omeprazole (PRILOSEC) 40 MG capsule Take 1 capsule (40 mg total) by mouth 2 (two) times daily before a meal for 60 doses. 08/26/20 09/25/20 Yes Milus Banister, MD  ondansetron (ZOFRAN) 8 MG tablet Take 1 tablet (8 mg total) by mouth every 8 (eight) hours as needed for nausea or vomiting. 05/26/20  Yes Ladell Pier, MD  oxyCODONE-acetaminophen (PERCOCET) 5-325 MG tablet Take 1-2 tablets by mouth every 6 (six) hours as needed for severe pain. 08/04/20  Yes Ladell Pier, MD  Polyethylene Glycol 3350 (MIRALAX PO) Take 17 g by mouth 2 (two) times daily as needed (constipation).   Yes [provider]  potassium chloride (KLOR-CON) 10 MEQ tablet Take 10 mEq by mouth every evening. 11/10/14  Yes [provider]  sucralfate (CARAFATE) 1 GM/10ML suspension Take 10 mLs (1 g total) by mouth 4 (four) times daily. 08/26/20 08/26/21 Yes Milus Banister, MD  traMADol (ULTRAM) 50 MG tablet Take 1 tablet (50 mg total) by mouth every 8 (eight) hours as needed for pain. 07/30/12  Yes Hudnall, Sharyn Lull, MD     Family History  Problem Relation Age of Onset  . Sudden death Neg Hx   . Hypertension Neg Hx   . Hyperlipidemia Neg Hx   . Heart attack Neg Hx   . Diabetes Neg Hx     Social History   Socioeconomic History  . Marital status: Divorced    Spouse name: Not on file  . Number of children: Not on file  . Years of education: Not on file  . Highest education level: Not on file  Occupational History  . Not on file  Tobacco Use  . Smoking status: Never Smoker  . Smokeless tobacco: Never Used  Vaping Use  . Vaping Use: Never used  Substance and Sexual Activity  . Alcohol use: No  . Drug  use: No  . Sexual activity: Yes    Birth control/protection: Post-menopausal  Other Topics Concern  . Not on file  Social History Narrative  . Not on file   Social Determinants of Health   Financial Resource Strain: Not on file  Food Insecurity: Not on file  Transportation Needs: Not on file  Physical Activity: Not on file  Stress: Not on file  Social Connections: Not on file     Review of Systems: A 12 point ROS discussed and pertinent positives are indicated in the HPI above.  All other systems are negative.  Review of Systems  Constitutional: Negative for chills and fever.  Respiratory: Positive for shortness of breath. Negative for wheezing.   Cardiovascular: Negative for chest pain and palpitations.  Gastrointestinal: Negative for abdominal pain.  Neurological: Negative for headaches.  Psychiatric/Behavioral: Negative for behavioral problems and confusion.    Vital Signs: BP (!) 78/60   Pulse (!) 43   Temp 98.4 F (36.9 C) (Oral)  Resp (!) 24   Wt 164 lb 7.4 oz (74.6 kg)   SpO2 100%   BMI 28.23 kg/m   Physical Exam Vitals and nursing note reviewed.  Constitutional:      General: She is not in acute distress.    Comments: Receiving blood transfusion.  Cardiovascular:     Rate and Rhythm: Regular rhythm. Tachycardia present.     Heart sounds: Normal heart sounds. No murmur heard.   Pulmonary:     Effort: No respiratory distress.     Breath sounds: Normal breath sounds.     Comments: Tachypenic. On RA. Skin:    General: Skin is warm and dry.  Neurological:     Mental Status: She is alert and oriented to person, place, and time.      MD Evaluation Airway: WNL Heart: WNL Abdomen: WNL Chest/ Lungs: WNL ASA  Classification: 3 Mallampati/Airway Score: Two   Imaging: CT ANGIO CHEST PE W OR WO CONTRAST  Result Date: 09/09/2020 CLINICAL DATA:  55 year old female with history of abnormal V/Q scan. History of breast cancer. EXAM: CT ANGIOGRAPHY  CHEST WITH CONTRAST TECHNIQUE: Multidetector CT imaging of the chest was performed using the standard protocol during bolus administration of intravenous contrast. Multiplanar CT image reconstructions and MIPs were obtained to evaluate the vascular anatomy. CONTRAST:  135mL OMNIPAQUE IOHEXOL 350 MG/ML SOLN COMPARISON:  Chest CTA 08/16/2020. FINDINGS: Cardiovascular: Numerous filling defects throughout the pulmonary arterial tree compatible with extensive pulmonary embolus. Specifically, there are 2 large saddle emboli with extension into lobar, segmental and subsegmental sized branches in the lungs bilaterally. The majority of these emboli appear nonocclusive. Pulmonic trunk is currently nondilated measuring 2.3 cm in diameter. Right ventricle appears dilated with a diameter of approximately 52 mm. Left ventricular diameter is approximately 37 mm. RV to LV ratio of 1.41. There is no significant pericardial fluid, thickening or pericardial calcification. No atherosclerotic calcifications in the thoracic aorta or the coronary arteries. Left-sided subclavian single-lumen porta cath with tip terminating in the right atrium. Mediastinum/Nodes: No pathologically enlarged mediastinal or hilar lymph nodes. Esophagus is unremarkable in appearance. No axillary lymphadenopathy. Lungs/Pleura: There are some patchy areas of ground-glass attenuation and mild septal thickening in the lower lobes of the lungs bilaterally, nonspecific, potentially predominantly atelectatic, although areas of mild alveolar hemorrhage are difficult to exclude. No confluent consolidative airspace disease. Trace left pleural effusion. No definite suspicious appearing pulmonary nodules or masses are noted. Upper Abdomen: Small amount of perinephric stranding adjacent to the left kidney, nonspecific. Musculoskeletal: There are no aggressive appearing lytic or blastic lesions noted in the visualized portions of the skeleton. Review of the MIP images  confirms the above findings. IMPRESSION: 1. Positive for acute PE with CT evidence of right heart strain (RV/LV Ratio = 1.41) consistent with at least submassive (intermediate risk) PE. The presence of right heart strain has been associated with an increased risk of morbidity and mortality. Please refer to the "PE Focused" order set in EPIC. 2. Small amount of ground-glass attenuation and septal thickening in the lower lobes of the lungs bilaterally. The possibility of areas of mild alveolar hemorrhage is not excluded, although most of this is likely atelectatic. 3. Trace left pleural effusion. Critical Value/emergent results were discussed in person at the time of interpretation on 09/09/2020 at 3:44 pm to provider Dr. Ronny Bacon, who verbally acknowledged these results. Electronically Signed   By: Vinnie Langton M.D.   On: 09/09/2020 16:00   CT ANGIO CHEST PE W OR WO  CONTRAST  Result Date: 08/16/2020 CLINICAL DATA:  Shortness of breath and chest pain. History of breast, gastric, and colon cancer. EXAM: CT ANGIOGRAPHY CHEST WITH CONTRAST TECHNIQUE: Multidetector CT imaging of the chest was performed using the standard protocol during bolus administration of intravenous contrast. Multiplanar CT image reconstructions and MIPs were obtained to evaluate the vascular anatomy. CONTRAST:  165mL OMNIPAQUE IOHEXOL 350 MG/ML SOLN COMPARISON:  Chest CT May 11, 2020 FINDINGS: Cardiovascular: There is no demonstrable pulmonary embolus. A vessel in the left lower lobe makes an acute turn causing a subtle defect on the axial images which is not confirmed on other images and is not felt to represent pulmonary embolus. There is no thoracic aortic aneurysm or dissection. Visualized great vessels appear unremarkable. Port-A-Cath tip is in the superior vena cava. No evident pericardial effusion or pericardial thickening. Mediastinum/Nodes: Status post left thyroidectomy. Remaining thyroid appears normal. No appreciable  adenopathy. There remains circumferential thickening in the distal esophagus which is unchanged from prior study. This area may represent previous treated neoplasm but also may represent esophagitis. The appearance in this area is stable. Lungs/Pleura: There is slight bibasilar atelectasis. No evident edema or airspace opacity. No appreciable pleural effusions. Trachea and major bronchial structures appear patent. No evident pneumothorax. Upper Abdomen: Gallbladder is absent. Occasional presumed calcified granulomas in the spleen. Incomplete visualization of cyst arising from posterior right kidney measuring 0.8 x 0.8 cm. Musculoskeletal: No blastic or lytic bone lesions. Port present on the left anteriorly. No chest wall lesions appreciable. Review of the MIP images confirms the above findings. IMPRESSION: 1. No demonstrable pulmonary embolus. No thoracic aortic aneurysm or dissection. 2. Circumferential thickening distal esophagus again noted which may represent residua of previous treated neoplasm but also could represent a degree of esophagitis. 3.  No edema or airspace opacity.  Mild bibasilar atelectasis. 4.  No evident adenopathy. 5. Status post left-sided thyroidectomy. Status post cholecystectomy. 6.  Port-A-Cath tip in superior vena cava near cavoatrial junction. Electronically Signed   By: Lowella Grip III M.D.   On: 08/16/2020 13:31   NM Pulmonary Perfusion  Result Date: 09/09/2020 CLINICAL DATA:  Deep venous thrombosis, shortness of breath and chest pain for 3 days, elevated D-dimer, high clinical suspicion of pulmonary embolism EXAM: NUCLEAR MEDICINE PERFUSION LUNG SCAN TECHNIQUE: Perfusion images were obtained in multiple projections after intravenous injection of radiopharmaceutical. Ventilation scans intentionally deferred if perfusion scan and chest x-ray adequate for interpretation during COVID 19 epidemic. RADIOPHARMACEUTICALS:  4.25 mCi Tc-50m MAA IV COMPARISON:  Chest radiograph  09/08/2020 FINDINGS: Subsegmental perfusion defects identified within RIGHT middle lobe and lingula. Additional subsegmental perfusion defect LEFT lower lobe. Large multi segmental perfusion defect in RIGHT upper lobe. Findings are consistent with pulmonary embolism. Chest radiograph shows mild atelectasis or infiltrate in the RIGHT lower lobe, remaining lungs clear. IMPRESSION: Multiple BILATERAL pulmonary perfusion defects consistent with pulmonary embolism. Critical Value/emergent results were called by telephone at the time of interpretation on 09/09/2020 at 1200 hrs to provider Dr. Aileen Fass, who verbally acknowledged these results. Electronically Signed   By: Lavonia Dana M.D.   On: 09/09/2020 12:00   DG Chest Port 1 View  Result Date: 09/08/2020 CLINICAL DATA:  Shortness of breath.  Syncopal episodes. EXAM: PORTABLE CHEST 1 VIEW COMPARISON:  09/07/2019 FINDINGS: Stable appearance of the left subclavian Port-A-Cath with the tip extending into the upper right atrium. There is a chronic kink in the port near the inferior aspect of the left clavicle. Lungs are clear. Heart size  is normal. Trachea is midline. No acute bone abnormality. IMPRESSION: 1. No acute cardiopulmonary disease. 2. Stable appearance of the left subclavian port with a kink in the catheter near the left clavicle. 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 Electronically Signed   By: Markus Daft M.D.   On: 09/08/2020 13:00   ECHOCARDIOGRAM COMPLETE  Result Date: 09/09/2020    ECHOCARDIOGRAM REPORT   Patient Name:   SHAWNELL DYKES Date of Exam: 09/09/2020 Medical Rec #:  [id number]         Height:       64.0 in Accession #:    [id number]        Weight:       164.5 lb Date of Birth:  02-Jul-1965         BSA:          1.800 m Patient Age:    21 years          BP:           91/62 mmHg Patient Gender: F                 HR:           114 bpm. Exam Location:  Inpatient Procedure: 2D Echo, Cardiac Doppler  and Color Doppler Indications:    Elevated troponin  History:        Patient has no prior history of Echocardiogram examinations.                 Signs/Symptoms:Syncope and Elevated Troponins. Gastric cancer,                 breast cancer, anemia, Chemo.  Sonographer:    Dustin Flock Referring Phys: [id number] [place]  1. There is sinus tachycardia with severe RV dilation and moderately reduced RV function. 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  2. Left ventricular ejection fraction, by estimation, is 60 to 65%. The left ventricle has normal function. The left ventricle has no regional wall motion abnormalities. Indeterminate diastolic filling due to E-A fusion. There is the interventricular septum is flattened in systole and diastole, consistent with right ventricular pressure and volume overload.  3. Right ventricular systolic function is moderately reduced. The right ventricular size is severely enlarged. There is mildly elevated pulmonary artery systolic pressure. The estimated right ventricular systolic pressure is 51.7 mmHg.  4. The mitral valve is grossly normal. No evidence of mitral valve regurgitation. No evidence of mitral stenosis.  5. The aortic valve is tricuspid. Aortic valve regurgitation is not visualized. No aortic stenosis is present.  6. The inferior vena cava is normal in size with greater than 50% respiratory variability, suggesting right atrial pressure of 3 mmHg. FINDINGS  Left Ventricle: Left ventricular ejection fraction, by estimation, is 60 to 65%. The left ventricle has normal function. The left ventricle has no regional wall motion abnormalities. The left ventricular internal cavity size was normal in size. There is  no left ventricular hypertrophy. The interventricular septum is flattened in systole and diastole, consistent with right ventricular pressure and volume overload. Indeterminate diastolic filling due to E-A fusion. Right Ventricle: The  right ventricular size is severely enlarged. No increase in right ventricular wall thickness. Right ventricular systolic function is moderately reduced. There is mildly elevated pulmonary artery systolic pressure. The tricuspid regurgitant velocity is 2.93 m/s, and with an assumed right atrial pressure of 3 mmHg, the estimated  right ventricular systolic pressure is 99.8 mmHg. Left Atrium: Left atrial size was normal in size. Right Atrium: Right atrial size was normal in size. Pericardium: Trivial pericardial effusion is present. Mitral Valve: The mitral valve is grossly normal. No evidence of mitral valve regurgitation. No evidence of mitral valve stenosis. Tricuspid Valve: The tricuspid valve is grossly normal. Tricuspid valve regurgitation is mild . No evidence of tricuspid stenosis. Aortic Valve: The aortic valve is tricuspid. Aortic valve regurgitation is not visualized. No aortic stenosis is present. Pulmonic Valve: The pulmonic valve was grossly normal. Pulmonic valve regurgitation is not visualized. No evidence of pulmonic stenosis. Aorta: The aortic root and ascending aorta are structurally normal, with no evidence of dilitation. Venous: The inferior vena cava is normal in size with greater than 50% respiratory variability, suggesting right atrial pressure of 3 mmHg. IAS/Shunts: The atrial septum is grossly normal. Additional Comments: A venous catheter is visualized in the right atrium.  LEFT VENTRICLE PLAX 2D LVIDd:         3.20 cm     Diastology LVIDs:         2.40 cm     LV e' medial:    8.05 cm/s LV PW:         1.10 cm     LV E/e' medial:  6.2 LV IVS:        1.20 cm     LV e' lateral:   7.62 cm/s LVOT diam:     1.80 cm     LV E/e' lateral: 6.5 LV SV:         23 LV SV Index:   13 LVOT Area:     2.54 cm  LV Volumes (MOD) LV vol d, MOD A4C: 47.5 ml LV vol s, MOD A4C: 20.7 ml LV SV MOD A4C:     47.5 ml RIGHT VENTRICLE RV Basal diam:  4.10 cm RV S prime:     8.27 cm/s TAPSE (M-mode): 1.6 cm LEFT ATRIUM              Index       RIGHT ATRIUM          Index LA diam:        2.30 cm 1.28 cm/m  RA Area:     9.61 cm LA Vol (A2C):   14.9 ml 8.28 ml/m  RA Volume:   21.80 ml 12.11 ml/m LA Vol (A4C):   25.8 ml 14.33 ml/m LA Biplane Vol: 21.4 ml 11.89 ml/m  AORTIC VALVE LVOT Vmax:   81.70 cm/s LVOT Vmean:  51.100 cm/s LVOT VTI:    0.092 m  AORTA Ao Root diam: 2.60 cm MITRAL VALVE               TRICUSPID VALVE MV Area (PHT): 6.22 cm    TR Peak grad:   34.3 mmHg MV Decel Time: 122 msec    TR Vmax:        293.00 cm/s MV E velocity: 49.80 cm/s MV A velocity: 78.20 cm/s  SHUNTS MV E/A ratio:  0.64        Systemic VTI:  0.09 m                            Systemic Diam: 1.80 cm Eleonore Chiquito MD Electronically signed by Eleonore Chiquito MD Signature Date/Time: 09/09/2020/12:04:31 PM    Final    VAS [place] LOWER EXTREMITY VENOUS (DVT)  Result Date: 09/09/2020  Lower Venous DVT Study Patient Name:  MARGARETA LAUREANO  Date of Exam:   09/09/2020 Medical Rec #: [id number]          Accession #:    [id number] Date of Birth: [birth year]          Patient Gender: F Patient Age:   89F Exam Location:  [facility] Procedure:      VAS [place] LOWER EXTREMITY VENOUS (DVT) Referring Phys: [id number] Silvestre Moment [facility] --------------------------------------------------------------------------------  Indications: Pulmonary embolism.  Comparison Study: no prior Performing Technologist: Abram Sander RVS  Examination Guidelines: A complete evaluation includes B-mode imaging, spectral Doppler, color Doppler, and power Doppler as needed of all accessible portions of each vessel. Bilateral testing is considered an integral part of a complete examination. Limited examinations for reoccurring indications may be performed as noted. The reflux portion of the exam is performed with the patient in reverse Trendelenburg.  +---------+---------------+---------+-----------+----------+--------------+ RIGHT    CompressibilityPhasicitySpontaneityPropertiesThrombus Aging  +---------+---------------+---------+-----------+----------+--------------+ CFV      Full           Yes      Yes                                 +---------+---------------+---------+-----------+----------+--------------+ SFJ      Full                                                        +---------+---------------+---------+-----------+----------+--------------+ FV Prox  Full                                                        +---------+---------------+---------+-----------+----------+--------------+ FV Mid   Full                                                        +---------+---------------+---------+-----------+----------+--------------+ FV DistalFull                                                        +---------+---------------+---------+-----------+----------+--------------+ PFV      Full                                                        +---------+---------------+---------+-----------+----------+--------------+ POP      Full           Yes      Yes                                 +---------+---------------+---------+-----------+----------+--------------+ PTV      Full                                                        +---------+---------------+---------+-----------+----------+--------------+  PERO     Full                                                        +---------+---------------+---------+-----------+----------+--------------+   +---------+---------------+---------+-----------+----------+-----------------+ LEFT     CompressibilityPhasicitySpontaneityPropertiesThrombus Aging    +---------+---------------+---------+-----------+----------+-----------------+ CFV      Full           Yes      Yes                                    +---------+---------------+---------+-----------+----------+-----------------+ SFJ      Full                                                            +---------+---------------+---------+-----------+----------+-----------------+ FV Prox  Full                                                           +---------+---------------+---------+-----------+----------+-----------------+ FV Mid   Full                                                           +---------+---------------+---------+-----------+----------+-----------------+ FV DistalFull                                                           +---------+---------------+---------+-----------+----------+-----------------+ PFV      Full                                                           +---------+---------------+---------+-----------+----------+-----------------+ POP      None           Yes      Yes                  Age Indeterminate +---------+---------------+---------+-----------+----------+-----------------+ PTV      None                                         Age Indeterminate +---------+---------------+---------+-----------+----------+-----------------+ PERO     None                                         Age Indeterminate +---------+---------------+---------+-----------+----------+-----------------+  Summary: RIGHT: - There is no evidence of deep vein thrombosis in the lower extremity.  - No cystic structure found in the popliteal fossa.  LEFT: - Findings consistent with age indeterminate deep vein thrombosis involving the left popliteal vein, left posterior tibial veins, and left peroneal veins. - No cystic structure found in the popliteal fossa.  *See table(s) above for measurements and observations. Electronically signed by Servando Snare MD on 09/09/2020 at 4:03:54 PM.    Final     Labs:  CBC: Recent Labs    08/23/20 1409 09/01/20 0950 09/08/20 1213 09/09/20 0330 09/09/20 1200  WBC 3.9* 8.0 12.1* 8.8  --   HGB 10.9* 9.1* 7.3* 7.5* 7.6*  HCT 33.0* 29.5* 25.2* 24.4* 25.2*  PLT 291.0 215 184 133*  --     COAGS: No results for  input(s): INR, APTT in the last 8760 hours.  BMP: Recent Labs    11/25/19 1037 12/09/19 1136 12/23/19 1110 01/06/20 0954 01/20/20 1149 08/16/20 1014 09/01/20 0950 09/08/20 1213 09/09/20 0330  NA 140 140 138 140   < > 135 136 135 136  K 4.0 4.0 4.3 3.8   < > 3.6 4.0 3.9 3.9  CL 107 107 107 108   < > 102 104 103 107  CO2 25 26 26 26    < > 26 24 22  20*  GLUCOSE 87 85 80 99   < > 159* 116* 185* 96  BUN 9 11 11 10    < > 14 11 17 14   CALCIUM 9.2 8.9 8.6* 8.5*   < > 8.6* 8.5* 8.4* 7.6*  CREATININE 0.81 0.84 0.77 0.80   < > 0.50 0.43* 0.76 0.36*  GFRNONAA >60 >60 >60 >60   < > >60 >60 >60 >60  GFRAA >60 >60 >60 >60  --   --   --   --   --    < > = values in this interval not displayed.    LIVER FUNCTION TESTS: Recent Labs    08/16/20 1014 09/01/20 0950 09/08/20 1213 09/09/20 0330  BILITOT 0.6 0.4 0.2* 0.9  AST 16 16 24 18   ALT 10 7 8 8   ALKPHOS 75 85 101 79  PROT 6.3* 6.5 6.7 5.5*  ALBUMIN 3.4* 3.4* 2.9* 2.4*     Assessment and Plan:  Acute symptomatic (dyspnea, near-syncope) massive pulmonary embolism with evidence of right heart strain in setting of bleeding metastatic gastric cancer. Plan for transfer to [facility], followed by image-guided pulmonary angiogram with possible mechanical thrombectomy in IR tentatively for today pending transfer to [facility] and IR scheduling. Patient is NPO. Afebrile and WBCs WNL. On heparin gtt, not on any additional blood thinners. INR pending.  Risks and benefits discussed with the patient including, but not limited to bleeding, possible life threatening bleeding and need for blood product transfusion, vascular injury, stroke, contrast induced renal failure, infection, cardiac arrest, and even death. All of the patient's questions were answered, patient is agreeable to proceed. Consent signed and in chart.   Thank you for this interesting consult.  I greatly enjoyed meeting Enslie Sahota and look forward to participating in their care.  A  copy of this report was sent to the requesting provider on this date.  Electronically Signed: Earley Abide, PA-C 09/09/2020, 4:20 PM   I spent a total of 40 Minutes in face to face in clinical consultation, greater than 50% of which was counseling/coordinating care for PE/mechanical thrombectomy.

## 2020-09-09 NOTE — Progress Notes (Addendum)
HEMATOLOGY-ONCOLOGY PROGRESS NOTE  SUBJECTIVE: [name] is followed by our office for gastric cancer.  She had an EGD performed 08/26/2020 which showed near complete malignant transformation of her gastric mucosa leading to spontaneous bleeding.  Due to these findings, her chemotherapy with Ramucirumab/paclitaxel was discontinued.  GI has recommended against anticoagulation due to endoscopy findings.  Her last cycle of chemotherapy was given on 08/11/2020.  The patient is now admitted with near syncope, massive PE, symptomatic anemia.  Echocardiogram consistent with acute cor pulmonale.  The patient is now currently in the ICU.  She has been seen by cardiology, GI, palliative care, and PCCM.  Multidisciplinary discussion earlier today regarding patient.  Difficult situation given EGD findings and now with massive PE with acute cor pulmonale.  The patient has stated she wants aggressive measures and is not interested in pursuing comfort measures.  She is currently on a heparin drip without bolus.  She is in the process of being transferred from [facility] long hospital to [facility] for mechanical thrombectomy.  The patient is lying in bed.  She appears comfortable.  She offers no complaints today.  She currently denies chest pain or shortness of breath.  She has not noticed any bleeding and did not know that she was having black stools.  Oncology History  Malignant neoplasm of stomach ([place])  08/26/2019 Initial Diagnosis   Malignant neoplasm of stomach ([place])   09/04/2019 -  Chemotherapy    Patient is on Treatment Plan: GASTROESOPHAGEAL RAMUCIRUMAB D1, 15  / PACLITAXEL D1,8,15 Q28D      09/19/2019 Genetic Testing   Negative genetic testing on the common hereditary cancer panel.  The Common Hereditary Gene Panel offered by [facility] includes sequencing and/or deletion duplication testing of the following 48 genes: APC, ATM, AXIN2, BARD1, BMPR1A, BRCA1, BRCA2, BRIP1, CDH1, CDK4, CDKN2A (p14ARF), CDKN2A  (p16INK4a), CHEK2, CTNNA1, DICER1, EPCAM (Deletion/duplication testing only), GREM1 (promoter region deletion/duplication testing only), KIT, MEN1, MLH1, MSH2, MSH3, MSH6, MUTYH, NBN, NF1, NHTL1, PALB2, PDGFRA, PMS2, POLD1, POLE, PTEN, RAD50, RAD51C, RAD51D, RNF43, SDHB, SDHC, SDHD, SMAD4, SMARCA4. STK11, TP53, TSC1, TSC2, and VHL.  The following genes were evaluated for sequence changes only: SDHA and HOXB13 c.251G>A variant only. The report date is September 19, 2019.     PHYSICAL EXAMINATION:  Vitals:   09/09/20 1404 09/09/20 1414  BP:  (!) 78/60  Pulse:    Resp: (!) 23 (!) 24  Temp:  98.4 F (36.9 C)  SpO2:     Filed Weights   09/08/20 1526  Weight: 74.6 kg    Intake/Output from previous day: 05/25 0701 - 05/26 0700 In: 4335.6 [P.O.:240; I.V.:1127.8; Blood:341.7; IV Piggyback:2626.1] Out: -   GENERAL:alert, no distress and comfortable LUNGS: clear to auscultation and percussion with normal breathing effort HEART: Tachycardic, no lower extremity edema ABDOMEN:abdomen soft, non-tender and normal bowel sounds NEURO: alert & oriented x 3 with fluent speech, no focal motor/sensory deficits  Port-A-Cath without erythema  LABORATORY DATA:  I have reviewed the data as listed CMP Latest Ref Rng & Units 09/09/2020 09/08/2020 09/01/2020  Glucose 70 - 99 mg/dL 96 185(H) 116(H)  BUN 6 - 20 mg/dL _0 Creatinine 0.44 - 1.00 mg/dL 0.36(L) 0.76 0.43(L)  Sodium 135 - 145 mmol/L 136 135 136  Potassium 3.5 - 5.1 mmol/L 3.9 3.9 4.0  Chloride 98 - 111 mmol/L 107 103 104  CO2 22 - 32 mmol/L 20(L) 22 24  Calcium 8.9 - 10.3 mg/dL 7.6(L) 8.4(L) 8.5(L)  Total Protein 6.5 -  8.1 g/dL 5.5(L) 6.7 6.5  Total Bilirubin 0.3 - 1.2 mg/dL 0.9 0.2(L) 0.4  Alkaline Phos 38 - 126 U/L 79 101 85  AST 15 - 41 U/L _0 ALT 0 - 44 U/L _1 Lab Results  Component Value Date   WBC 8.8 09/09/2020   HGB 7.6 (L) 09/09/2020   HCT 25.2 (L) 09/09/2020   MCV 91.7 09/09/2020   PLT 133 (L) 09/09/2020    NEUTROABS 9.7 (H) 09/08/2020    CT ANGIO CHEST PE W OR WO CONTRAST  Result Date: 08/16/2020 CLINICAL DATA:  Shortness of breath and chest pain. History of breast, gastric, and colon cancer. EXAM: CT ANGIOGRAPHY CHEST WITH CONTRAST TECHNIQUE: Multidetector CT imaging of the chest was performed using the standard protocol during bolus administration of intravenous contrast. Multiplanar CT image reconstructions and MIPs were obtained to evaluate the vascular anatomy. CONTRAST:  131m OMNIPAQUE IOHEXOL 350 MG/ML SOLN COMPARISON:  Chest CT May 11, 2020 FINDINGS: Cardiovascular: There is no demonstrable pulmonary embolus. A vessel in the left lower lobe makes an acute turn causing a subtle defect on the axial images which is not confirmed on other images and is not felt to represent pulmonary embolus. There is no thoracic aortic aneurysm or dissection. Visualized great vessels appear unremarkable. Port-A-Cath tip is in the superior vena cava. No evident pericardial effusion or pericardial thickening. Mediastinum/Nodes: Status post left thyroidectomy. Remaining thyroid appears normal. No appreciable adenopathy. There remains circumferential thickening in the distal esophagus which is unchanged from prior study. This area may represent previous treated neoplasm but also may represent esophagitis. The appearance in this area is stable. Lungs/Pleura: There is slight bibasilar atelectasis. No evident edema or airspace opacity. No appreciable pleural effusions. Trachea and major bronchial structures appear patent. No evident pneumothorax. Upper Abdomen: Gallbladder is absent. Occasional presumed calcified granulomas in the spleen. Incomplete visualization of cyst arising from posterior right kidney measuring 0.8 x 0.8 cm. Musculoskeletal: No blastic or lytic bone lesions. Port present on the left anteriorly. No chest wall lesions appreciable. Review of the MIP images confirms the above findings. IMPRESSION: 1. No  demonstrable pulmonary embolus. No thoracic aortic aneurysm or dissection. 2. Circumferential thickening distal esophagus again noted which may represent residua of previous treated neoplasm but also could represent a degree of esophagitis. 3.  No edema or airspace opacity.  Mild bibasilar atelectasis. 4.  No evident adenopathy. 5. Status post left-sided thyroidectomy. Status post cholecystectomy. 6.  Port-A-Cath tip in superior vena cava near cavoatrial junction. Electronically Signed   By: WLowella GripIII M.D.   On: 08/16/2020 13:31   NM Pulmonary Perfusion  Result Date: 09/09/2020 CLINICAL DATA:  Deep venous thrombosis, shortness of breath and chest pain for 3 days, elevated D-dimer, high clinical suspicion of pulmonary embolism EXAM: NUCLEAR MEDICINE PERFUSION LUNG SCAN TECHNIQUE: Perfusion images were obtained in multiple projections after intravenous injection of radiopharmaceutical. Ventilation scans intentionally deferred if perfusion scan and chest x-ray adequate for interpretation during COVID 19 epidemic. RADIOPHARMACEUTICALS:  4.25 mCi Tc-981mAA IV COMPARISON:  Chest radiograph 09/08/2020 FINDINGS: Subsegmental perfusion defects identified within RIGHT middle lobe and lingula. Additional subsegmental perfusion defect LEFT lower lobe. Large multi segmental perfusion defect in RIGHT upper lobe. Findings are consistent with pulmonary embolism. Chest radiograph shows mild atelectasis or infiltrate in the RIGHT lower lobe, remaining lungs clear. IMPRESSION: Multiple BILATERAL pulmonary perfusion defects consistent with pulmonary embolism. Critical Value/emergent results were called by telephone at the time of  interpretation on 09/09/2020 at 1200 hrs to provider Dr. Aileen Fass, who verbally acknowledged these results. Electronically Signed   By: Lavonia Dana M.D.   On: 09/09/2020 12:00   DG Chest Port 1 View  Result Date: 09/08/2020 CLINICAL DATA:  Shortness of breath.  Syncopal episodes. EXAM:  PORTABLE CHEST 1 VIEW COMPARISON:  09/07/2019 FINDINGS: Stable appearance of the left subclavian Port-A-Cath with the tip extending into the upper right atrium. There is a chronic kink in the port near the inferior aspect of the left clavicle. Lungs are clear. Heart size is normal. Trachea is midline. No acute bone abnormality. IMPRESSION: 1. No acute cardiopulmonary disease. 2. Stable appearance of the left subclavian port with a kink in the catheter near the left clavicle. 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 Electronically Signed   By: Markus Daft M.D.   On: 09/08/2020 13:00   ECHOCARDIOGRAM COMPLETE  Result Date: 09/09/2020    ECHOCARDIOGRAM REPORT   Patient Name:   KAMILL FULBRIGHT Date of Exam: 09/09/2020 Medical Rec #:  [id number]         Height:       64.0 in Accession #:    [id number]        Weight:       164.5 lb Date of Birth:  06/20/1965         BSA:          1.800 m Patient Age:    55 years          BP:           91/62 mmHg Patient Gender: F                 HR:           114 bpm. Exam Location:  Inpatient Procedure: 2D Echo, Cardiac Doppler and Color Doppler Indications:    Elevated troponin  History:        Patient has no prior history of Echocardiogram examinations.                 Signs/Symptoms:Syncope and Elevated Troponins. Gastric cancer,                 breast cancer, anemia, Chemo.  Sonographer:    Dustin Flock Referring Phys: [id number] [place]  1. There is sinus tachycardia with severe RV dilation and moderately reduced RV function. 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  2. Left ventricular ejection fraction, by estimation, is 60 to 65%. The left ventricle has normal function. The left ventricle has no regional wall motion abnormalities. Indeterminate diastolic filling due to E-A fusion. There is the interventricular septum is flattened in systole and diastole, consistent with right ventricular pressure  and volume overload.  3. Right ventricular systolic function is moderately reduced. The right ventricular size is severely enlarged. There is mildly elevated pulmonary artery systolic pressure. The estimated right ventricular systolic pressure is 30.8 mmHg.  4. The mitral valve is grossly normal. No evidence of mitral valve regurgitation. No evidence of mitral stenosis.  5. The aortic valve is tricuspid. Aortic valve regurgitation is not visualized. No aortic stenosis is present.  6. The inferior vena cava is normal in size with greater than 50% respiratory variability, suggesting right atrial pressure of 3 mmHg. FINDINGS  Left Ventricle: Left ventricular ejection fraction, by estimation, is 60 to 65%. The left ventricle has normal function. The left ventricle has  no regional wall motion abnormalities. The left ventricular internal cavity size was normal in size. There is  no left ventricular hypertrophy. The interventricular septum is flattened in systole and diastole, consistent with right ventricular pressure and volume overload. Indeterminate diastolic filling due to E-A fusion. Right Ventricle: The right ventricular size is severely enlarged. No increase in right ventricular wall thickness. Right ventricular systolic function is moderately reduced. There is mildly elevated pulmonary artery systolic pressure. The tricuspid regurgitant velocity is 2.93 m/s, and with an assumed right atrial pressure of 3 mmHg, the estimated right ventricular systolic pressure is 43.3 mmHg. Left Atrium: Left atrial size was normal in size. Right Atrium: Right atrial size was normal in size. Pericardium: Trivial pericardial effusion is present. Mitral Valve: The mitral valve is grossly normal. No evidence of mitral valve regurgitation. No evidence of mitral valve stenosis. Tricuspid Valve: The tricuspid valve is grossly normal. Tricuspid valve regurgitation is mild . No evidence of tricuspid stenosis. Aortic Valve: The aortic valve  is tricuspid. Aortic valve regurgitation is not visualized. No aortic stenosis is present. Pulmonic Valve: The pulmonic valve was grossly normal. Pulmonic valve regurgitation is not visualized. No evidence of pulmonic stenosis. Aorta: The aortic root and ascending aorta are structurally normal, with no evidence of dilitation. Venous: The inferior vena cava is normal in size with greater than 50% respiratory variability, suggesting right atrial pressure of 3 mmHg. IAS/Shunts: The atrial septum is grossly normal. Additional Comments: A venous catheter is visualized in the right atrium.  LEFT VENTRICLE PLAX 2D LVIDd:         3.20 cm     Diastology LVIDs:         2.40 cm     LV e' medial:    8.05 cm/s LV PW:         1.10 cm     LV E/e' medial:  6.2 LV IVS:        1.20 cm     LV e' lateral:   7.62 cm/s LVOT diam:     1.80 cm     LV E/e' lateral: 6.5 LV SV:         23 LV SV Index:   13 LVOT Area:     2.54 cm  LV Volumes (MOD) LV vol d, MOD A4C: 47.5 ml LV vol s, MOD A4C: 20.7 ml LV SV MOD A4C:     47.5 ml RIGHT VENTRICLE RV Basal diam:  4.10 cm RV S prime:     8.27 cm/s TAPSE (M-mode): 1.6 cm LEFT ATRIUM             Index       RIGHT ATRIUM          Index LA diam:        2.30 cm 1.28 cm/m  RA Area:     9.61 cm LA Vol (A2C):   14.9 ml 8.28 ml/m  RA Volume:   21.80 ml 12.11 ml/m LA Vol (A4C):   25.8 ml 14.33 ml/m LA Biplane Vol: 21.4 ml 11.89 ml/m  AORTIC VALVE LVOT Vmax:   81.70 cm/s LVOT Vmean:  51.100 cm/s LVOT VTI:    0.092 m  AORTA Ao Root diam: 2.60 cm MITRAL VALVE               TRICUSPID VALVE MV Area (PHT): 6.22 cm    TR Peak grad:   34.3 mmHg MV Decel Time: 122 msec    TR Vmax:  293.00 cm/s MV E velocity: 49.80 cm/s MV A velocity: 78.20 cm/s  SHUNTS MV E/A ratio:  0.64        Systemic VTI:  0.09 m                            Systemic Diam: 1.80 cm Eleonore Chiquito MD Electronically signed by Eleonore Chiquito MD Signature Date/Time: 09/09/2020/12:04:31 PM    Final    VAS [place] LOWER EXTREMITY VENOUS  (DVT)  Result Date: 09/09/2020  Lower Venous DVT Study Patient Name:  ARDA DAGGS  Date of Exam:   09/09/2020 Medical Rec #: [id number]          Accession #:    [id number] Date of Birth: Jan 28, 1966          Patient Gender: F Patient Age:   60Y Exam Location:  [facility] Procedure:      VAS [place] LOWER EXTREMITY VENOUS (DVT) Referring Phys: [id number] Silvestre Moment [facility] --------------------------------------------------------------------------------  Indications: Pulmonary embolism.  Comparison Study: no prior Performing Technologist: Abram Sander RVS  Examination Guidelines: A complete evaluation includes B-mode imaging, spectral Doppler, color Doppler, and power Doppler as needed of all accessible portions of each vessel. Bilateral testing is considered an integral part of a complete examination. Limited examinations for reoccurring indications may be performed as noted. The reflux portion of the exam is performed with the patient in reverse Trendelenburg.  +---------+---------------+---------+-----------+----------+--------------+ RIGHT    CompressibilityPhasicitySpontaneityPropertiesThrombus Aging +---------+---------------+---------+-----------+----------+--------------+ CFV      Full           Yes      Yes                                 +---------+---------------+---------+-----------+----------+--------------+ SFJ      Full                                                        +---------+---------------+---------+-----------+----------+--------------+ FV Prox  Full                                                        +---------+---------------+---------+-----------+----------+--------------+ FV Mid   Full                                                        +---------+---------------+---------+-----------+----------+--------------+ FV DistalFull                                                         +---------+---------------+---------+-----------+----------+--------------+ PFV      Full                                                        +---------+---------------+---------+-----------+----------+--------------+  POP      Full           Yes      Yes                                 +---------+---------------+---------+-----------+----------+--------------+ PTV      Full                                                        +---------+---------------+---------+-----------+----------+--------------+ PERO     Full                                                        +---------+---------------+---------+-----------+----------+--------------+   +---------+---------------+---------+-----------+----------+-----------------+ LEFT     CompressibilityPhasicitySpontaneityPropertiesThrombus Aging    +---------+---------------+---------+-----------+----------+-----------------+ CFV      Full           Yes      Yes                                    +---------+---------------+---------+-----------+----------+-----------------+ SFJ      Full                                                           +---------+---------------+---------+-----------+----------+-----------------+ FV Prox  Full                                                           +---------+---------------+---------+-----------+----------+-----------------+ FV Mid   Full                                                           +---------+---------------+---------+-----------+----------+-----------------+ FV DistalFull                                                           +---------+---------------+---------+-----------+----------+-----------------+ PFV      Full                                                           +---------+---------------+---------+-----------+----------+-----------------+ POP      None           Yes      Yes  Age Indeterminate  +---------+---------------+---------+-----------+----------+-----------------+ PTV      None                                         Age Indeterminate +---------+---------------+---------+-----------+----------+-----------------+ PERO     None                                         Age Indeterminate +---------+---------------+---------+-----------+----------+-----------------+     Summary: RIGHT: - There is no evidence of deep vein thrombosis in the lower extremity.  - No cystic structure found in the popliteal fossa.  LEFT: - Findings consistent with age indeterminate deep vein thrombosis involving the left popliteal vein, left posterior tibial veins, and left peroneal veins. - No cystic structure found in the popliteal fossa.  *See table(s) above for measurements and observations.    Preliminary     ASSESSMENT AND PLAN: 1. Gastric cancer ? Presenting with dysphagia spring 2019 ? 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 CTs revealed wall thickening and luminal narrowing of the colon at the hepatic flexure and cecum ? PET scan with no evidence of distant metastatic disease or abnormal uptake other than thickening of the distal esophagus and proximal stomach ? 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 CTs 10/23/2017-diffuse prominence of the gastric wall, especially  the antrum, focal wall thickening of the distal ascending colon and hepatic flexure, thickening of the cecum, nonspecific haziness of the mesenteric fat in the pelvis, mild prominence of lymph nodes at the greater curvature of the stomach ? Treatment with FOLFOX beginning July 2019 ? CTs October 2019-stable disease, FOLFOX continued ? CTs December 2019-stable disease ? January 2020 treatment transition to Xeloda maintenance, care transition to [place] ? CTs in June 2020 in September 2020-stable disease, Xeloda continued ? CTs 06/23/2019-nonspecific thickening of the GE junction, intraluminal bladder mass ? Cystoscopy-metastatic signet ring cell adenocarcinoma ? Cycle 1 day 1 ramucirumab/paclitaxel 08/19/2019(given atanother facility) ? Cycle 1 day 1 ramucirumab/paclitaxel 09/04/2019 ? Cycle 1 day 8 Taxol 09/10/19 ? Cycle 1 day 15 ramucirumab/paclitaxel 6/2/2021canceled d/t neutropenia ? Ramucirumab/pactlitaxelevery 2 weeks 09/24/2019 ? Ramucirumab/paclitaxel 10/08/2019 ? Ramucirumab/paclitaxel 10/22/2019 ? Ramucirumab/paclitaxel 11/05/2019 ? CTs 11/24/2019-unchanged thickening of the distal esophagus/upper stomach, gastric body and antrum. Unchanged thickening of the transverse colon at the hepatic flexure, unchanged thickening of the urinary bladder, no evidence of progressive disease ? Ramucirumab/paclitaxel 11/25/2019 ? Ramucirumab/paclitaxel 12/09/2019 ? Ramucirumab/paclitaxel 12/23/2019 ? Ramucirumab/paclitaxel9/21/2021 ? Ramucirumab/paclitaxel 01/20/2020 ? Ramucirumab/paclitaxel 02/04/2020 ? Ramucirumab/paclitaxel 02/17/2020 ? Ramucirumab/paclitaxel 03/02/2020 ? Ramucirumab/paclitaxel 03/16/2020 ? CTs 03/28/2020-stable thickening of the distal esophagus, GE junction, gastric body, and antrum. Decreased soft tissue thickening of the transverse colon at the hepatic flexure, no evidence of metastatic disease to the chest ? Ramucirumab/paclitaxel  03/30/2020 ? SBRT to the stomach 04/12/2020-05/06/2020, 37.5 Gray in 15 fractions ? Ramucirumab/paclitaxel 04/05/2020 ?  Ramucirumab/paclitaxel 04/13/2020 ? Ramucirumab/paclitaxel 04/27/2020 ? Ramucirumab/paclitaxel 05/25/2020 ? Ramucirumab/paclitaxel 06/11/2020 ? Ramucirumab/paclitaxel 06/22/2020 ? Ramucirumab/paclitaxel 07/06/2020 ? Ramucirumab/paclitaxel 07/20/2020 ? CT abdomen/pelvis 08/03/2020-wall thickening at the distal esophagus and stomach body/antrum-progressive, indistinct stranding around the descending duodenum, porta hepatis, and retroperitoneum-stable, abnormal wall thickening at the left upper urinary bladder-stable ? Ramucirumab/paclitaxel 08/11/2020 ? Upper endoscopy 08/26/2020- gastric mucosa nearly completely replaced with cancer.  Most proximal aspect of the stomach was spared but starting at the proximal gastric body there was extensive, circumferential cancer that continued into the proximal duodenal bulb.  The tumor is ulcerated in some areas, edematous throughout, very friable with spontaneous oozing.  The lumen of the distal stomach was narrowed due to edematous malignant changes but this did not limit scope passage.  2.Left breast cancer 2008,pT1c,pN0, status post a left lumpectomy with adjuvant chemotherapy and radiation, ER positive, PR positive, HER-2 positive  3.Mixed connective tissue disease/SLE  4.Lower extremity deep vein thrombosis maintained on apixaban-placed on hold due to hematemesis, hematuria 08/19/2020.  See findings of upper endoscopy from 08/26/2020.  Dr. Ardis Hughs recommends against ever resuming a blood thinner.  5.Family history of multiple cancers including breast and ovarian cancer  6.Dysphagia and intermittent vomiting secondary to #1  7.Hypertension  8.Peripheral neuropathy  9.Masslike fullness at the posterior right parotid/angle of the jaw  10. Dyspnea on exertion, ongoing for about a month, worsened after taxol/ramucirumab  on 09/04/19 requiring ED visit on 5/23. CBC, CMP, troponin, BNP and chest xray negative  11.Anemia likely secondary to combination of chemotherapy and blood loss  2 units packed red blood cells 04/12/2020, 07/20/2020 12.Admission 05/11/2020 with pneumonia, CT chest revealed left upper and lower lobe opacities 13. Progressive abdominal pain beginning 07/29/2020-etiology unclear, likely related to progression of tumor in the stomach 14.  Massive PE with cor pulmonale May 2022 15.  Hospital admission 09/08/2020 massive syncope, PE, symptomatic anemia.  [name] is now admitted following a syncopal episode.  Hemoglobin was only 7.3 on admission and stool for occult blood positive.  2 units PRBCs have been ordered so far this admission.  VQ scan showed multiple bilateral pulmonary perfusion defects consistent with PE.  CTA chest showed acute PE with evidence of right heart strain consistent with at least submassive PE.  The patient had a recent endoscopy which showed that gastric mucosa was nearly completely replaced with gastric tumor.  They recommended against anticoagulation.  She is currently on a heparin drip without bolus and in the process of being transferred to [facility] for mechanical thrombectomy.  The patient's overall prognosis is poor.  This has been discussed with her by [place] as well as there team members.  She wishes to continue aggressive measures.  Recommendations: 1.  Transferred to [facility] today for mechanical thrombectomy. 2.  May have to consider IVC filter placement at some point for her lower extremity DVT. 3.  Monitor hemoglobin closely and transfuse per ICU parameters. 4.  Referral for second opinion to [place] is still pending.  She will proceed with this as an outpatient depending on how she recovers from her hospitalization. 5.  Management of pulmonary embolism per critical care medicine and interventional radiology   LOS: 1 day   Mikey Bussing, DNP,  AGPCNP-BC, AOCNP 09/09/20 Ms. Wentz was interviewed and examined.  She is well-known to me with a history of metastatic gastric cancer, currently maintained off of specific therapy.  A recent upper endoscopy revealed marked progression of tumor in the stomach.  She is admitted after a syncope  event and is found to have bilateral pulmonary embolism.  She has been evaluated by critical care medicine and interventional radiology.  The plan is to proceed with a thrombectomy procedure.  I discussed the difficult circumstances with Ms. Mergen and her family with pulmonary emboli and risk of bleeding from gastric tumor.  She understands the gastric cancer is not curable.  She would like to continue aggressive interventions for now.  The plan is to proceed with a thrombectomy procedure in interventional radiology.  She may be a candidate for IVC filter placement if her condition stabilizes over the next few days.    She was referred for a second opinion at [facility], but this has not been scheduled.  She understands systemic treatment options are limited.  We have discussed salvage therapy with FOLFIRI.  We will try to arrange for a telehealth visit with the GI oncology service at [facility].  I reviewed the CT images.  I was present for greater than 50% of today's visit.  I performed medical decision making.   I will check on her 09/10/2020.  Please call oncology as needed.

## 2020-09-09 NOTE — Consult Note (Signed)
Consultation Note Date: 09/09/2020   Patient Name: Michelle Cain  DOB: 1965/06/22  MRN: [id number]  Age / Sex: 55 y.o., female  PCP: Michelle Schimke, DO Referring Physician: Spero Geralds, MD  Reason for Consultation: Establishing goals of care  HPI/Patient Profile: 55 y.o. female  with past medical history of gastric cancer and colon cancer on chemotherapy, DVT, lupus, fibromyalgia, neuropathy, admitted on 09/08/2020 with near syncopal episode and weakness, acute blood loss anemia revealed during work-up.  Patient has been seen by PCCM due to acute PE and severe right heart strain, cor pulmonale. She is faced with difficult decision making and treatment options, given bleeding risk in the setting of malignancy. Palliative care has been consulted to assist with goals of care conversation.    Clinical Assessment and Goals of Care:  I have reviewed medical records including EPIC notes, labs and imaging, assessed the patient and then met at the bedside to discuss diagnosis, prognosis, GOC, EOL wishes, disposition and options.  I introduced Palliative Medicine as specialized medical care for people living with serious illness. It focuses on providing relief from the symptoms and stress of a serious illness. The goal is to improve quality of life for both the patient and the family.  We discussed a brief life review of the patient and then focused on their current illness. Michelle Cain is supported by her mother and only child, an adult son. She understands the severity of her illness, stating "I don't want to die, but I may not make it out of the hospital." She reports her cancer was diagnosed 3 years ago and she has received treatment at the [facility] in [place] and [place]. She understands the current care plan and difficult options she is faced with. She remains open to a second option  and/or transfer at [facility] as recently discussed with Dr. Benay Cain.  I attempted to elicit values and goals of care important to the patient.    Michelle Cain states that she has been dealing with a lot through her current illness and her mindset remains focused on continuing to fight. She clearly states that she will take any treatments that are offered. She is also willing to consider any facility that may offer life-prolonging treatment options. She references a conversation of code status yesterday and confirms that she would desire a resuscitation attempt if indicated. She notes that her son may disagree with her on this decision. If she became unable to make her own decisions, Michelle Cain would like for her mother and son to make decisions together on her behalf.  The difference between aggressive medical intervention and comfort care was considered in light of the patient's goals of care.   Advanced directives, concepts specific to code status, artifical feeding and hydration, and rehospitalization were considered and discussed.  Discussed the importance of continued conversation with family and the medical providers regarding overall plan of care and treatment options, ensuring decisions are within the context of the patient's values and GOCs.    Questions  and concerns were addressed. The patient was encouraged to call with questions or concerns.  PMT will continue to support holistically.    PATIENT is the primary decision maker. No HCPOA on file.    SUMMARY OF RECOMMENDATIONS   -Full code/full scope -Patient is willing to consider all life-prolonging interventions and remains interested in second opinion at [facility] as discussed outpatient with Dr. Benay Cain -PMT will provide ongoing support   Code Status/Advance Care Planning:  Full code  Palliative Prophylaxis:   Bowel Regimen, Delirium Protocol and Frequent Pain Assessment  Additional Recommendations (Limitations, Scope, Preferences):  Full  Scope Treatment  Psycho-social/Spiritual:   Desire for further Chaplaincy support:TBD  Additional Recommendations: Caregiving  Support/Resources  Prognosis:   Very poor prognosis  Discharge Planning: To Be Determined      Primary Diagnoses: Present on Admission: . Symptomatic anemia   I have reviewed the medical record, interviewed the patient and family, and examined the patient. The following aspects are pertinent.  Past Medical History:  Diagnosis Date  . Anemia   . Arthritis   . Breast CA ([place]) dx'd 2007   left  . Carpal tunnel syndrome on both sides   . DVT (deep venous thrombosis) (HCC)    Left leg  . Dyspnea    with activity,   . Fibromyalgia   . History of blood transfusion 08/17/2020  . Lupus ([place])   . Pneumonia    History of  . Raynaud disease   . stomach/colon ca dx'd 2020   Social History   Socioeconomic History  . Marital status: Divorced    Spouse name: Not on file  . Number of children: Not on file  . Years of education: Not on file  . Highest education level: Not on file  Occupational History  . Not on file  Tobacco Use  . Smoking status: Never Smoker  . Smokeless tobacco: Never Used  Vaping Use  . Vaping Use: Never used  Substance and Sexual Activity  . Alcohol use: No  . Drug use: No  . Sexual activity: Yes    Birth control/protection: Post-menopausal  Other Topics Concern  . Not on file  Social History Narrative  . Not on file   Social Determinants of Health   Financial Resource Strain: Not on file  Food Insecurity: Not on file  Transportation Needs: Not on file  Physical Activity: Not on file  Stress: Not on file  Social Connections: Not on file   Family History  Problem Relation Age of Onset  . Sudden death Neg Hx   . Hypertension Neg Hx   . Hyperlipidemia Neg Hx   . Heart attack Neg Hx   . Diabetes Neg Hx    Scheduled Meds: . sodium chloride   Intravenous Once  . sodium chloride   Intravenous Once  .  chlorhexidine  15 mL Mouth Rinse BID  . Chlorhexidine Gluconate Cloth  6 each Topical Daily  . dronabinol  2.5 mg Oral BID  . feeding supplement  1 Container Oral TID BM  . mouth rinse  15 mL Mouth Rinse q12n4p  . pantoprazole (PROTONIX) IV  40 mg Intravenous Q12H   Continuous Infusions: . heparin    . lactated ringers Stopped (09/09/20 1103)   PRN Meds:.acetaminophen **OR** acetaminophen, ondansetron (ZOFRAN) IV, oxyCODONE, promethazine Medications Prior to Admission:  Prior to Admission medications   Medication Sig Start Date End Date Taking? Authorizing Provider  azaTHIOprine (IMURAN) 50 MG tablet Take 50 mg by mouth 2 (two)  times daily.   Yes [provider]  Cholecalciferol 50 MCG (2000 UT) CAPS Take 2,000 Units by mouth every evening.   Yes [provider]  docusate sodium (COLACE) 100 MG capsule Take 100 mg by mouth 2 (two) times daily as needed (constipation). 08/11/20  Yes Ladell Pier, MD  gabapentin (NEURONTIN) 300 MG capsule Take 300 mg by mouth in the morning and at bedtime. 05/29/18  Yes [provider]  HYDROcodone-acetaminophen (NORCO) 10-325 MG tablet Take 1 tablet by mouth every 6 (six) hours as needed for pain. 09/02/20  Yes [provider]  hydroxychloroquine (PLAQUENIL) 200 MG tablet Take 200 mg by mouth 2 (two) times daily.   Yes [provider]  metoCLOPramide (REGLAN) 10 MG tablet Take 1 tablet (10 mg total) by mouth 3 (three) times daily before meals. 04/27/20  Yes Owens Shark, NP  omeprazole (PRILOSEC) 40 MG capsule Take 1 capsule (40 mg total) by mouth 2 (two) times daily before a meal for 60 doses. 08/26/20 09/25/20 Yes Milus Banister, MD  ondansetron (ZOFRAN) 8 MG tablet Take 1 tablet (8 mg total) by mouth every 8 (eight) hours as needed for nausea or vomiting. 05/26/20  Yes Ladell Pier, MD  oxyCODONE-acetaminophen (PERCOCET) 5-325 MG tablet Take 1-2 tablets by mouth every 6 (six) hours as needed for severe pain.  08/04/20  Yes Ladell Pier, MD  Polyethylene Glycol 3350 (MIRALAX PO) Take 17 g by mouth 2 (two) times daily as needed (constipation).   Yes [provider]  potassium chloride (KLOR-CON) 10 MEQ tablet Take 10 mEq by mouth every evening. 11/10/14  Yes [provider]  sucralfate (CARAFATE) 1 GM/10ML suspension Take 10 mLs (1 g total) by mouth 4 (four) times daily. 08/26/20 08/26/21 Yes Milus Banister, MD  traMADol (ULTRAM) 50 MG tablet Take 1 tablet (50 mg total) by mouth every 8 (eight) hours as needed for pain. 07/30/12  Yes Hudnall, Sharyn Lull, MD   No Known Allergies Review of Systems  Neurological: Positive for weakness.    Physical Exam Vitals and nursing note reviewed.  Constitutional:      Appearance: She is ill-appearing.  Cardiovascular:     Rate and Rhythm: Bradycardia present.  Pulmonary:     Effort: Tachypnea present.  Neurological:     Mental Status: She is alert and oriented to person, place, and time.  Psychiatric:        Behavior: Behavior is cooperative.     Vital Signs: BP 111/68   Pulse (!) 43   Temp 98.5 F (36.9 C) (Oral)   Resp (!) 31   Wt 74.6 kg   SpO2 100%   BMI 28.23 kg/m  Pain Scale: 0-10   Pain Score: 0-No pain   SpO2: SpO2: 100 % O2 Device:SpO2: 100 % O2 Flow Rate: .   IO: Intake/output summary:   Intake/Output Summary (Last 24 hours) at 09/09/2020 1320 Last data filed at 09/09/2020 1123 Gross per 24 hour  Intake 5675.36 ml  Output --  Net 5675.36 ml    LBM: Last BM Date: 09/07/20 Baseline Weight: Weight: 74.6 kg Most recent weight: Weight: 74.6 kg     Palliative Assessment/Data:   PPS 50%  Time In: 1:00pm Time Out: 2pm Time Total: 60 minutes Greater than 50% of this time was spent in counseling and coordinating care related to the above assessment and plan.  Loistine Chance MD Palliative Medicine Team Team phone # [phone]  Thank you for allowing the  Palliative Medicine Team to assist in the care of  this patient. Please utilize secure chat with additional questions, if there is no response within 30 minutes please call the above phone number.  Palliative Medicine Team providers are available by phone from 7am to 7pm daily and can be reached through the team cell phone.  Should this patient require assistance outside of these hours, please call the patient's attending physician.

## 2020-09-09 NOTE — Sedation Documentation (Signed)
Pt moved back to bed max assist without complaints. PT transported to room 3M09, greeted by Mindy RN at bedside. Report given at bedside. Pt had no complaints at this time, vss. Groin level 0

## 2020-09-09 NOTE — Consult Note (Signed)
NAME:  Michelle Cain, MRN:  [id number], DOB:  01/05/1966, LOS: 1 ADMISSION DATE:  09/08/2020, CONSULTATION DATE:  5/26 REFERRING MD:  Dr. Nevada Crane, CHIEF COMPLAINT:  PE  History of Present Illness:  55 year old female with PMH as below, which is significant for gastric cancer managed by Dr. Benay Spice. She has been undergoing chemotherapy continuously since 2019 with last treatment on 4/5. She underwent endoscopy 5/12 which, despite chemotherapy, showed the entire gastric mucosa to have been replaced by malignancy. She was seen in the oncology office 5/18 and she was referred to a tertiary center. Then 5/24 she presented to [facility] ED with complaints of near syncope. She became diaphoretic and dyspneic while using the restroom and briefly slumped over the sink.   In the ED she complained of progressive dyspnea for about one month prior, which grew significantly worse in the days leading to her presentation. Upon arrival to the emergency department she was oxygenating well, was tachycardic, and had borderline hypotension. laboratory evaluation was significant for a 2 gram drop in her hemoglobin from one week prior. She was admitted to the hospitalist service for symptomatic anemia presumably from GI bleeding, and transfusion was initiated. GI was involved and did not recommend any further intervention endoscopically. As part of her dyspnea workup, a V/Q scan showed multiple bilateral perfusion defects consistent with pulmonary embolism. Echocardiogram consistent with acute cor pulmonale. PCCM was consulted for recommendations regarding PE.   Pertinent  Medical History   has a past medical history of Anemia, Arthritis, Breast CA ([place]) (dx'd 2007), Carpal tunnel syndrome on both sides, DVT (deep venous thrombosis) ([place]), Dyspnea, Fibromyalgia, History of blood transfusion (08/17/2020), Lupus ([place]), Pneumonia, Raynaud disease, and stomach/colon ca (dx'd 2020).   Significant Hospital Events: Including  procedures, antibiotic start and stop dates in addition to other pertinent events   . 8/25 admit for syncope/symptomatic anemia . 8/26 PE diagnosed, Tx to ICU. PCCM consulted.   Interim History / Subjective:    Objective   Blood pressure 111/68, pulse (!) 43, temperature 98.4 F (36.9 C), temperature source Oral, resp. rate (!) 33, weight 74.6 kg, SpO2 100 %.        Intake/Output Summary (Last 24 hours) at 09/09/2020 1244 Last data filed at 09/09/2020 1123 Gross per 24 hour  Intake 5675.36 ml  Output --  Net 5675.36 ml   Filed Weights   09/08/20 1526  Weight: 74.6 kg    Examination: General: middle aged female of normal body habitus HENT: [place]/AT, PERRL, no JVD Lungs: Clear bilateral breath sounds Cardiovascular: Tachy, regular, no MRG Abdomen: Soft, non-tender, non-distended Extremities: No acute deformity or ROM limitation Neuro: Alert, oriented, non-focal.   Labs/imaging that I havepersonally reviewed  (right click and "Reselect all SmartList Selections" daily)   5/26 V/Q > Multiple BILATERAL pulmonary perfusion defects consistent with pulmonary embolism.   Echo: severe RV dilation and moderately reduced RV function. Intraventricular septal flattening. LVEF 60-65%.  Hemoglobin 7.5  CO2 20 Calcium 7.6 corrects to 8.9 Creatinine 0.36   Resolved Hospital Problem list     Assessment & Plan:   Gastric cancer: followed by Dr. Benay Spice who has been on chemotherapy since 2019. Endoscopy earlier this month shows near complete replacement of gastric mucosa by malignancy causing spontaneous bleeding. Patient was hoping to seek second opinion at [facility].  - Oncology and GI following - Anticoagulation recommended against from a GI bleed perspective.   Acute massive pulmonary embolism: history of DVT. Syncope at home. PE diagnosed on  VQ 5/16. High risk by PESI score 133. Acute cor pulmonale described on echocardiogram. - Heparin infusion with no bolus - CT angiogram for  better characterization of clot burden - IR has been involved for consideration of endovascular therapies.  - Very high risk for anticoagulation/thrombolytics, but benefit may outweigh risk. Ongoing discussion with multidisciplinary team including oncology, GI, IR, and palliative care. The patient currently is endorsing all possible aggressive therapies.   Elevated troponin: likely represents demand ischemia due to anemia/PE - Cardiology following - not a candidate for long term DAPT  Acute blood loss anemia secondary to gastric cancer - Transfuse per ICU guidlelines - Trend CBC  Goals of care - palliative care following - Currently patient supports full aggressive tx as indicated.    Best practice (right click and "Reselect all SmartList Selections" daily)  Diet:  NPO Pain/Anxiety/Delirium protocol (if indicated): No VAP protocol (if indicated): Not indicated DVT prophylaxis: Systemic AC GI prophylaxis: PPI Glucose control:  SSI No Central venous access:  N/A Arterial line:  N/A Foley:  N/A Mobility:  bed rest  PT consulted: N/A Last date of multidisciplinary goals of care discussion [  ] Code Status:  full code Disposition: ICU, prognosis guarded.   Labs   CBC: Recent Labs  Lab 09/08/20 1213 09/09/20 0330 09/09/20 1200  WBC 12.1* 8.8  --   NEUTROABS 9.7*  --   --   HGB 7.3* 7.5* 7.6*  HCT 25.2* 24.4* 25.2*  MCV 93.3 91.7  --   PLT 184 133*  --     Basic Metabolic Panel: Recent Labs  Lab 09/08/20 1213 09/08/20 2133 09/09/20 0330  NA 135  --  136  K 3.9  --  3.9  CL 103  --  107  CO2 22  --  20*  GLUCOSE 185*  --  96  BUN 17  --  14  CREATININE 0.76  --  0.36*  CALCIUM 8.4*  --  7.6*  MG  --  1.8  --    GFR: Estimated Creatinine Clearance: 80.5 mL/min (A) (by C-G formula based on SCr of 0.36 mg/dL (L)). Recent Labs  Lab 09/08/20 1213 09/08/20 1449 09/08/20 2133 09/09/20 0330  WBC 12.1*  --   --  8.8  LATICACIDVEN 5.4* 2.6* 1.3  --     Liver  Function Tests: Recent Labs  Lab 09/08/20 1213 09/09/20 0330  AST 24 18  ALT 8 8  ALKPHOS 101 79  BILITOT 0.2* 0.9  PROT 6.7 5.5*  ALBUMIN 2.9* 2.4*   Recent Labs  Lab 09/08/20 1213  LIPASE 17   No results for input(s): AMMONIA in the last 168 hours.  ABG No results found for: PHART, PCO2ART, PO2ART, HCO3, TCO2, ACIDBASEDEF, O2SAT   Coagulation Profile: No results for input(s): INR, PROTIME in the last 168 hours.  Cardiac Enzymes: No results for input(s): CKTOTAL, CKMB, CKMBINDEX, TROPONINI in the last 168 hours.  HbA1C: No results found for: HGBA1C  CBG: Recent Labs  Lab 09/08/20 1159  GLUCAP 117*    Review of Systems:   Negative except for as outlined above in HPI  Past Medical History:  She,  has a past medical history of Anemia, Arthritis, Breast CA ([place]) (dx'd 2007), Carpal tunnel syndrome on both sides, DVT (deep venous thrombosis) ([place]), Dyspnea, Fibromyalgia, History of blood transfusion (08/17/2020), Lupus ([place]), Pneumonia, Raynaud disease, and stomach/colon ca (dx'd 2020).   Surgical History:   Past Surgical History:  Procedure Laterality Date  . BREAST SURGERY  Left    with lymph nodes  . CHOLECYSTECTOMY    . ESOPHAGOGASTRODUODENOSCOPY (EGD) WITH PROPOFOL N/A 08/26/2020   Procedure: ESOPHAGOGASTRODUODENOSCOPY (EGD) WITH PROPOFOL;  Surgeon: Milus Banister, MD;  Location: [facility];  Service: Endoscopy;  Laterality: N/A;  . UPPER GI ENDOSCOPY       Social History:   reports that she has never smoked. She has never used smokeless tobacco. She reports that she does not drink alcohol and does not use drugs.   Family History:  Her family history is negative for Sudden death, Hypertension, Hyperlipidemia, Heart attack, and Diabetes.   Allergies No Known Allergies   Home Medications  Prior to Admission medications   Medication Sig Start Date End Date Taking? Authorizing Provider  azaTHIOprine (IMURAN) 50 MG tablet Take 50 mg by mouth 2 (two)  times daily.   Yes [provider]  Cholecalciferol 50 MCG (2000 UT) CAPS Take 2,000 Units by mouth every evening.   Yes [provider]  docusate sodium (COLACE) 100 MG capsule Take 100 mg by mouth 2 (two) times daily as needed (constipation). 08/11/20  Yes Ladell Pier, MD  gabapentin (NEURONTIN) 300 MG capsule Take 300 mg by mouth in the morning and at bedtime. 05/29/18  Yes [provider]  HYDROcodone-acetaminophen (NORCO) 10-325 MG tablet Take 1 tablet by mouth every 6 (six) hours as needed for pain. 09/02/20  Yes [provider]  hydroxychloroquine (PLAQUENIL) 200 MG tablet Take 200 mg by mouth 2 (two) times daily.   Yes [provider]  metoCLOPramide (REGLAN) 10 MG tablet Take 1 tablet (10 mg total) by mouth 3 (three) times daily before meals. 04/27/20  Yes Owens Shark, NP  omeprazole (PRILOSEC) 40 MG capsule Take 1 capsule (40 mg total) by mouth 2 (two) times daily before a meal for 60 doses. 08/26/20 09/25/20 Yes Milus Banister, MD  ondansetron (ZOFRAN) 8 MG tablet Take 1 tablet (8 mg total) by mouth every 8 (eight) hours as needed for nausea or vomiting. 05/26/20  Yes Ladell Pier, MD  oxyCODONE-acetaminophen (PERCOCET) 5-325 MG tablet Take 1-2 tablets by mouth every 6 (six) hours as needed for severe pain. 08/04/20  Yes Ladell Pier, MD  Polyethylene Glycol 3350 (MIRALAX PO) Take 17 g by mouth 2 (two) times daily as needed (constipation).   Yes [provider]  potassium chloride (KLOR-CON) 10 MEQ tablet Take 10 mEq by mouth every evening. 11/10/14  Yes [provider]  sucralfate (CARAFATE) 1 GM/10ML suspension Take 10 mLs (1 g total) by mouth 4 (four) times daily. 08/26/20 08/26/21 Yes Milus Banister, MD  traMADol (ULTRAM) 50 MG tablet Take 1 tablet (50 mg total) by mouth every 8 (eight) hours as needed for pain. 07/30/12  Yes Dene Gentry, MD     Critical care time: 40 minutes     Georgann Housekeeper,  AGACNP-BC [facility]  See Amion for personal pager PCCM on call pager [phone] until 7pm. Please call Elink 7p-7a. [phone]  09/09/2020 1:46 PM

## 2020-09-09 NOTE — Anesthesia Preprocedure Evaluation (Addendum)
Anesthesia Evaluation  Patient identified by MRN, date of birth, ID band Patient awake    Reviewed: Allergy & Precautions, NPO status , Patient's Chart, lab work & pertinent test results  History of Anesthesia Complications Negative for: history of anesthetic complications  Airway Mallampati: II  TM Distance: >3 FB Neck ROM: Full    Dental  (+) Missing, Dental Advisory Given   Pulmonary PE   Pulmonary exam normal        Cardiovascular negative cardio ROS   Rhythm:Regular Rate:Tachycardia     Neuro/Psych negative neurological ROS     GI/Hepatic Neg liver ROS, Gastric Cancer   Endo/Other  negative endocrine ROS  Renal/GU negative Renal ROS     Musculoskeletal  (+) Fibromyalgia -  Abdominal   Peds  Hematology negative hematology ROS (+) SLE   Anesthesia Other Findings   Reproductive/Obstetrics                           Anesthesia Physical Anesthesia Plan  ASA: IV and emergent  Anesthesia Plan: MAC   Post-op Pain Management:    Induction:   PONV Risk Score and Plan: Treatment may vary due to age or medical condition  Airway Management Planned: Nasal Cannula and Natural Airway  Additional Equipment: Arterial line  Intra-op Plan:   Post-operative Plan:   Informed Consent: I have reviewed the patients History and Physical, chart, labs and discussed the procedure including the risks, benefits and alternatives for the proposed anesthesia with the patient or authorized representative who has indicated his/her understanding and acceptance.     Dental advisory given  Plan Discussed with: CRNA, Anesthesiologist and Surgeon  Anesthesia Plan Comments: (Minimal sedation)       Anesthesia Quick Evaluation

## 2020-09-09 NOTE — Consult Note (Signed)
[facility] Referring Provider: [facility] hospitalist Primary Care Physician:  Laurette Schimke, DO Primary Gastroenterologist:  Dr. Ardis Hughs  Reason for Consultation: GI bleeding   HPI:  Michelle Cain is a 55 y.o. female with metastatic gastric cancer.  Most of her care has been elsewhere however I recently performed EGD for her at the request of Dr. Benay Spice for hematemesis in setting of blood thinner for DVT.  See that report in epic. In short her stomach mucosa has been nearly completely replaced with friable, ulcerated, spontaneously oozing cancer.  I recommended she not resume her blood thinner.  She was admitted with melena, acute on chronic anemia (off blood thinner).  Today she was diagnosed with bilateral PE, cor pumonale.  Heparin was started.   Past Medical History:  Diagnosis Date  . Anemia   . Arthritis   . Breast CA ([place]) dx'd 2007   left  . Carpal tunnel syndrome on both sides   . DVT (deep venous thrombosis) (HCC)    Left leg  . Dyspnea    with activity,   . Fibromyalgia   . History of blood transfusion 08/17/2020  . Lupus ([place])   . Pneumonia    History of  . Raynaud disease   . stomach/colon ca dx'd 2020    Past Surgical History:  Procedure Laterality Date  . BREAST SURGERY Left    with lymph nodes  . CHOLECYSTECTOMY    . ESOPHAGOGASTRODUODENOSCOPY (EGD) WITH PROPOFOL N/A 08/26/2020   Procedure: ESOPHAGOGASTRODUODENOSCOPY (EGD) WITH PROPOFOL;  Surgeon: Milus Banister, MD;  Location: [facility];  Service: Endoscopy;  Laterality: N/A;  . UPPER GI ENDOSCOPY      Prior to Admission medications   Medication Sig Start Date End Date Taking? Authorizing Provider  azaTHIOprine (IMURAN) 50 MG tablet Take 50 mg by mouth 2 (two) times daily.   Yes [provider]  Cholecalciferol 50 MCG (2000 UT) CAPS Take 2,000 Units by mouth every evening.   Yes [provider]  docusate sodium (COLACE) 100 MG capsule Take 100 mg by mouth 2  (two) times daily as needed (constipation). 08/11/20  Yes Ladell Pier, MD  gabapentin (NEURONTIN) 300 MG capsule Take 300 mg by mouth in the morning and at bedtime. 05/29/18  Yes [provider]  HYDROcodone-acetaminophen (NORCO) 10-325 MG tablet Take 1 tablet by mouth every 6 (six) hours as needed for pain. 09/02/20  Yes [provider]  hydroxychloroquine (PLAQUENIL) 200 MG tablet Take 200 mg by mouth 2 (two) times daily.   Yes [provider]  metoCLOPramide (REGLAN) 10 MG tablet Take 1 tablet (10 mg total) by mouth 3 (three) times daily before meals. 04/27/20  Yes Owens Shark, NP  omeprazole (PRILOSEC) 40 MG capsule Take 1 capsule (40 mg total) by mouth 2 (two) times daily before a meal for 60 doses. 08/26/20 09/25/20 Yes Milus Banister, MD  ondansetron (ZOFRAN) 8 MG tablet Take 1 tablet (8 mg total) by mouth every 8 (eight) hours as needed for nausea or vomiting. 05/26/20  Yes Ladell Pier, MD  oxyCODONE-acetaminophen (PERCOCET) 5-325 MG tablet Take 1-2 tablets by mouth every 6 (six) hours as needed for severe pain. 08/04/20  Yes Ladell Pier, MD  Polyethylene Glycol 3350 (MIRALAX PO) Take 17 g by mouth 2 (two) times daily as needed (constipation).   Yes [provider]  potassium chloride (KLOR-CON) 10 MEQ tablet Take 10 mEq by mouth every evening. 11/10/14  Yes [provider]  sucralfate (  CARAFATE) 1 GM/10ML suspension Take 10 mLs (1 g total) by mouth 4 (four) times daily. 08/26/20 08/26/21 Yes Milus Banister, MD  traMADol (ULTRAM) 50 MG tablet Take 1 tablet (50 mg total) by mouth every 8 (eight) hours as needed for pain. 07/30/12  Yes Hudnall, Sharyn Lull, MD    Current Facility-Administered Medications  Medication Dose Route Frequency Provider Last Rate Last Admin  . 0.9 %  sodium chloride infusion (Manually program via Guardrails IV Fluids)   Intravenous Once Marylyn Ishihara, Tyrone A, DO      . 0.9 %  sodium chloride infusion (Manually program via  Guardrails IV Fluids)   Intravenous Once Irene Pap N, DO      . acetaminophen (TYLENOL) tablet 650 mg  650 mg Oral Q6H PRN Marylyn Ishihara, Tyrone A, DO       Or  . acetaminophen (TYLENOL) suppository 650 mg  650 mg Rectal Q6H PRN Marylyn Ishihara, Tyrone A, DO      . chlorhexidine (PERIDEX) 0.12 % solution 15 mL  15 mL Mouth Rinse BID Hall, Carole N, DO   15 mL at 09/09/20 1045  . Chlorhexidine Gluconate Cloth 2 % PADS 6 each  6 each Topical Daily Kayleen Memos, DO   6 each at 09/09/20 1249  . dronabinol (MARINOL) capsule 2.5 mg  2.5 mg Oral BID Marylyn Ishihara, Tyrone A, DO   2.5 mg at 09/09/20 1045  . feeding supplement (BOOST / RESOURCE BREEZE) liquid 1 Container  1 Container Oral TID BM Kyle, Tyrone A, DO   1 Container at 09/09/20 1048  . heparin ADULT infusion 100 units/mL (25000 units/264mL)  1,150 Units/hr Intravenous Continuous Emiliano Dyer, RPH      . lactated ringers infusion   Intravenous Continuous Kyle, Tyrone A, DO   Stopped at 09/09/20 1103  . MEDLINE mouth rinse  15 mL Mouth Rinse q12n4p Hall, Carole N, DO      . ondansetron [facility]) injection 4 mg  4 mg Intravenous Q8H PRN Kyle, Tyrone A, DO   4 mg at 09/09/20 1045  . oxyCODONE (Oxy IR/ROXICODONE) immediate release tablet 5 mg  5 mg Oral Q4H PRN Marylyn Ishihara, Tyrone A, DO   5 mg at 09/09/20 0222  . pantoprazole (PROTONIX) injection 40 mg  40 mg Intravenous Q12H Kyle, Tyrone A, DO   40 mg at 09/09/20 1045  . promethazine (PHENERGAN) tablet 12.5 mg  12.5 mg Oral Q6H PRN Marylyn Ishihara, Tyrone A, DO   12.5 mg at 09/09/20 0222   Facility-Administered Medications Ordered in Other Encounters  Medication Dose Route Frequency Provider Last Rate Last Admin  . sodium chloride flush (NS) 0.9 % injection 10 mL  10 mL Intravenous PRN Ladell Pier, MD   10 mL at 09/17/19 1109    Allergies as of 09/08/2020  . (No Known Allergies)    Family History  Problem Relation Age of Onset  . Sudden death Neg Hx   . Hypertension Neg Hx   . Hyperlipidemia Neg Hx   . Heart attack Neg  Hx   . Diabetes Neg Hx     Social History   Socioeconomic History  . Marital status: Divorced    Spouse name: Not on file  . Number of children: Not on file  . Years of education: Not on file  . Highest education level: Not on file  Occupational History  . Not on file  Tobacco Use  . Smoking status: Never Smoker  . Smokeless tobacco: Never Used  Vaping Use  .  Vaping Use: Never used  Substance and Sexual Activity  . Alcohol use: No  . Drug use: No  . Sexual activity: Yes    Birth control/protection: Post-menopausal  Other Topics Concern  . Not on file  Social History Narrative  . Not on file   Social Determinants of Health   Financial Resource Strain: Not on file  Food Insecurity: Not on file  Transportation Needs: Not on file  Physical Activity: Not on file  Stress: Not on file  Social Connections: Not on file  Intimate Partner Violence: Not on file   Review of Systems: Pertinent positive and negative review of systems were noted in the above HPI section. Complete review of systems was performed and was otherwise normal.  Physical Exam: Vital signs in last 24 hours: Temp:  [98.2 F (36.8 C)-100.2 F (37.9 C)] 98.4 F (36.9 C) (05/26 1332) Pulse Rate:  [18-131] 43 (05/26 1110) Resp:  [16-39] 22 (05/26 1332) BP: (91-124)/(13-83) 103/83 (05/26 1332) SpO2:  [59 %-100 %] 100 % (05/26 1110) Weight:  [74.6 kg] 74.6 kg (05/25 1526) Last BM Date: 09/07/20 Constitutional: generally well-appearing Psychiatric: alert and oriented x3 Eyes: extraocular movements intact Mouth: oral pharynx moist, no lesions Neck: supple no lymphadenopathy Cardiovascular: heart regular rate and rhythm Lungs: clear to auscultation bilaterally Abdomen: soft, nontender, nondistended, no obvious ascites, no peritoneal signs, normal bowel sounds Extremities: no lower extremity edema bilaterally Skin: no lesions on visible extremities  Lab Results: Recent Labs    09/08/20 1213  09/09/20 0330 09/09/20 1200  WBC 12.1* 8.8  --   HGB 7.3* 7.5* 7.6*  HCT 25.2* 24.4* 25.2*  PLT 184 133*  --   MCV 93.3 91.7  --    BMET Recent Labs    09/08/20 1213 09/09/20 0330  NA 135 136  K 3.9 3.9  CL 103 107  CO2 22 20*  GLUCOSE 185* 96  BUN 17 14  CREATININE 0.76 0.36*  CALCIUM 8.4* 7.6*   LFT Recent Labs    09/09/20 0330  BILITOT 0.9  AST 18  ALT 8  ALKPHOS 79  PROT 5.5*  ALBUMIN 2.4*     Imaging/Other Results: NM Pulmonary Perfusion  Result Date: 09/09/2020 CLINICAL DATA:  Deep venous thrombosis, shortness of breath and chest pain for 3 days, elevated D-dimer, high clinical suspicion of pulmonary embolism EXAM: NUCLEAR MEDICINE PERFUSION LUNG SCAN TECHNIQUE: Perfusion images were obtained in multiple projections after intravenous injection of radiopharmaceutical. Ventilation scans intentionally deferred if perfusion scan and chest x-ray adequate for interpretation during COVID 19 epidemic. RADIOPHARMACEUTICALS:  4.25 mCi Tc-36m MAA IV COMPARISON:  Chest radiograph 09/08/2020 FINDINGS: Subsegmental perfusion defects identified within RIGHT middle lobe and lingula. Additional subsegmental perfusion defect LEFT lower lobe. Large multi segmental perfusion defect in RIGHT upper lobe. Findings are consistent with pulmonary embolism. Chest radiograph shows mild atelectasis or infiltrate in the RIGHT lower lobe, remaining lungs clear. IMPRESSION: Multiple BILATERAL pulmonary perfusion defects consistent with pulmonary embolism. Critical Value/emergent results were called by telephone at the time of interpretation on 09/09/2020 at 1200 hrs to provider Dr. Aileen Fass, who verbally acknowledged these results. Electronically Signed   By: Lavonia Dana M.D.   On: 09/09/2020 12:00   DG Chest Port 1 View  Result Date: 09/08/2020 CLINICAL DATA:  Shortness of breath.  Syncopal episodes. EXAM: PORTABLE CHEST 1 VIEW COMPARISON:  09/07/2019 FINDINGS: Stable appearance of the left  subclavian Port-A-Cath with the tip extending into the upper right atrium. There is a chronic kink  in the port near the inferior aspect of the left clavicle. Lungs are clear. Heart size is normal. Trachea is midline. No acute bone abnormality. IMPRESSION: 1. No acute cardiopulmonary disease. 2. Stable appearance of the left subclavian port with a kink in the catheter near the left clavicle. 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 Electronically Signed   By: Markus Daft M.D.   On: 09/08/2020 13:00   ECHOCARDIOGRAM COMPLETE  Result Date: 09/09/2020    ECHOCARDIOGRAM REPORT   Patient Name:   Michelle Cain Date of Exam: 09/09/2020 Medical Rec #:  [id number]         Height:       64.0 in Accession #:    [id number]        Weight:       164.5 lb Date of Birth:  1966/01/11         BSA:          1.800 m Patient Age:    89 years          BP:           91/62 mmHg Patient Gender: F                 HR:           114 bpm. Exam Location:  Inpatient Procedure: 2D Echo, Cardiac Doppler and Color Doppler Indications:    Elevated troponin  History:        Patient has no prior history of Echocardiogram examinations.                 Signs/Symptoms:Syncope and Elevated Troponins. Gastric cancer,                 breast cancer, anemia, Chemo.  Sonographer:    Dustin Flock Referring Phys: [id number] Lares  1. There is sinus tachycardia with severe RV dilation and moderately reduced RV function. 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  2. Left ventricular ejection fraction, by estimation, is 60 to 65%. The left ventricle has normal function. The left ventricle has no regional wall motion abnormalities. Indeterminate diastolic filling due to E-A fusion. There is the interventricular septum is flattened in systole and diastole, consistent with right ventricular pressure and volume overload.  3. Right ventricular systolic function is moderately reduced. The  right ventricular size is severely enlarged. There is mildly elevated pulmonary artery systolic pressure. The estimated right ventricular systolic pressure is 48.5 mmHg.  4. The mitral valve is grossly normal. No evidence of mitral valve regurgitation. No evidence of mitral stenosis.  5. The aortic valve is tricuspid. Aortic valve regurgitation is not visualized. No aortic stenosis is present.  6. The inferior vena cava is normal in size with greater than 50% respiratory variability, suggesting right atrial pressure of 3 mmHg. FINDINGS  Left Ventricle: Left ventricular ejection fraction, by estimation, is 60 to 65%. The left ventricle has normal function. The left ventricle has no regional wall motion abnormalities. The left ventricular internal cavity size was normal in size. There is  no left ventricular hypertrophy. The interventricular septum is flattened in systole and diastole, consistent with right ventricular pressure and volume overload. Indeterminate diastolic filling due to E-A fusion. Right Ventricle: The right ventricular size is severely enlarged. No increase in right ventricular wall thickness. Right ventricular systolic function is moderately reduced. There is mildly elevated pulmonary artery systolic pressure. The tricuspid regurgitant velocity  is 2.93 m/s, and with an assumed right atrial pressure of 3 mmHg, the estimated right ventricular systolic pressure is 49.7 mmHg. Left Atrium: Left atrial size was normal in size. Right Atrium: Right atrial size was normal in size. Pericardium: Trivial pericardial effusion is present. Mitral Valve: The mitral valve is grossly normal. No evidence of mitral valve regurgitation. No evidence of mitral valve stenosis. Tricuspid Valve: The tricuspid valve is grossly normal. Tricuspid valve regurgitation is mild . No evidence of tricuspid stenosis. Aortic Valve: The aortic valve is tricuspid. Aortic valve regurgitation is not visualized. No aortic stenosis is  present. Pulmonic Valve: The pulmonic valve was grossly normal. Pulmonic valve regurgitation is not visualized. No evidence of pulmonic stenosis. Aorta: The aortic root and ascending aorta are structurally normal, with no evidence of dilitation. Venous: The inferior vena cava is normal in size with greater than 50% respiratory variability, suggesting right atrial pressure of 3 mmHg. IAS/Shunts: The atrial septum is grossly normal. Additional Comments: A venous catheter is visualized in the right atrium.  LEFT VENTRICLE PLAX 2D LVIDd:         3.20 cm     Diastology LVIDs:         2.40 cm     LV e' medial:    8.05 cm/s LV PW:         1.10 cm     LV E/e' medial:  6.2 LV IVS:        1.20 cm     LV e' lateral:   7.62 cm/s LVOT diam:     1.80 cm     LV E/e' lateral: 6.5 LV SV:         23 LV SV Index:   13 LVOT Area:     2.54 cm  LV Volumes (MOD) LV vol d, MOD A4C: 47.5 ml LV vol s, MOD A4C: 20.7 ml LV SV MOD A4C:     47.5 ml RIGHT VENTRICLE RV Basal diam:  4.10 cm RV S prime:     8.27 cm/s TAPSE (M-mode): 1.6 cm LEFT ATRIUM             Index       RIGHT ATRIUM          Index LA diam:        2.30 cm 1.28 cm/m  RA Area:     9.61 cm LA Vol (A2C):   14.9 ml 8.28 ml/m  RA Volume:   21.80 ml 12.11 ml/m LA Vol (A4C):   25.8 ml 14.33 ml/m LA Biplane Vol: 21.4 ml 11.89 ml/m  AORTIC VALVE LVOT Vmax:   81.70 cm/s LVOT Vmean:  51.100 cm/s LVOT VTI:    0.092 m  AORTA Ao Root diam: 2.60 cm MITRAL VALVE               TRICUSPID VALVE MV Area (PHT): 6.22 cm    TR Peak grad:   34.3 mmHg MV Decel Time: 122 msec    TR Vmax:        293.00 cm/s MV E velocity: 49.80 cm/s MV A velocity: 78.20 cm/s  SHUNTS MV E/A ratio:  0.64        Systemic VTI:  0.09 m                            Systemic Diam: 1.80 cm Eleonore Chiquito MD Electronically signed by Eleonore Chiquito MD Signature Date/Time: 09/09/2020/12:04:31 PM  Final       Impression/Plan: 55 y.o. female with metastatic gastric cancer, bilateral PE  Her gastric mucosa has been nearly  completely replaced with cancer. This will bleed with or without blood thinner but certainly being on a blood thinner the pace of bleeding will be faster.  There are no endoscopic options to treat this type of bleeding besides hemospray which lasts several hours only and clearly is not a viable option in this situation.  I will leave the decisions of how to care for her PEs to her primary team. Certainly resuming her blood thinners is reasonable. Should plan to transfuse her with blood products as needed.  Would continue IV PPI BID or infusion. Carafate TID.    I discussed this with her in her room, several family members present.   Milus Banister, MD  09/09/2020, 1:44 PM [facility] Pager [phone]

## 2020-09-09 NOTE — Progress Notes (Signed)
ANTICOAGULATION CONSULT NOTE - Initial Consult  Pharmacy Consult for Heparin Indication: pulmonary embolus bilateral  No Known Allergies  Patient Measurements: Weight: 74.6 kg (164 lb 7.4 oz) Heparin Dosing Weight: total weight  Vital Signs: Temp: 98.4 F (36.9 C) (05/26 0800) Temp Source: Oral (05/26 0800) BP: 111/68 (05/26 1000) Pulse Rate: 43 (05/26 1110)  Labs: Recent Labs    09/08/20 1213 09/08/20 1413 09/09/20 0330  HGB 7.3*  --  7.5*  HCT 25.2*  --  24.4*  PLT 184  --  133*  CREATININE 0.76  --  0.36*  TROPONINIHS 260* 448*  --     Estimated Creatinine Clearance: 80.5 mL/min (A) (by C-G formula based on SCr of 0.36 mg/dL (L)).   Medical History: Past Medical History:  Diagnosis Date  . Anemia   . Arthritis   . Breast CA ([place]) dx'd 2007   left  . Carpal tunnel syndrome on both sides   . DVT (deep venous thrombosis) (HCC)    Left leg  . Dyspnea    with activity,   . Fibromyalgia   . History of blood transfusion 08/17/2020  . Lupus ([place])   . Pneumonia    History of  . Raynaud disease   . stomach/colon ca dx'd 2020    Medications:  Scheduled:  . sodium chloride   Intravenous Once  . sodium chloride   Intravenous Once  . chlorhexidine  15 mL Mouth Rinse BID  . Chlorhexidine Gluconate Cloth  6 each Topical Daily  . dronabinol  2.5 mg Oral BID  . feeding supplement  1 Container Oral TID BM  . mouth rinse  15 mL Mouth Rinse q12n4p  . pantoprazole (PROTONIX) IV  40 mg Intravenous Q12H   Infusions:  . ceFEPime (MAXIPIME) IV Stopped (09/09/20 4259)  . lactated ringers Stopped (09/09/20 1103)  . vancomycin     PRN: acetaminophen **OR** acetaminophen, ondansetron (ZOFRAN) IV, oxyCODONE, promethazine  Assessment: 55 yo female with gastric cancer is admitted with weakness.  Pharmacy is consulted to dose IV heparin for bilateral PE per VQ scan, cor pulmonale per 2D echo.  She has significant anemia requiring PRBCs, most recent Hgb 7.5, Plts  133.  Goal of Therapy:  Heparin level 0.3-0.7 units/ml Monitor platelets by anticoagulation protocol: Yes   Plan:  No heparin bolus per d/w MD Heparin IV infusion 1150 units/hr Check heparin level in 6hr Daily heparin level and CBC Monitor closely for signs/symptoms of bleeding  Peggyann Juba, PharmD, BCPS Pharmacy: [phone] 09/09/2020,12:15 PM

## 2020-09-09 NOTE — Progress Notes (Signed)
[name] [id number] Admission Data: 09/09/2020 6:09 PM Attending Provider: Spero Geralds, MD  VWA:[id number], [name], DO Consults/ Treatment Team: Treatment Team:  Ladell Pier, MD Milus Banister, MD  Michelle Cain is a 55 y.o. female patient admitted from ED awake, alert  & orientated  X 3,  Full Code, VSS - Blood pressure (!) 110/98, pulse (!) 110, temperature 98.4 F (36.9 C), temperature source Oral, resp. rate (!) 24, weight 74.6 kg, SpO2 97 %., no c/o shortness of breath, no c/o chest pain, no distress noted. Tele # [id number] placed and pt is currently running:sinus tachycardia.   IV site WDL  Pt orientation to unit, room and routine. Information packet given to patient/family and safety video watched.  Admission INP armband ID verified with patient/family, and in place. SR up x 2, fall risk assessment complete with Patient and family verbalizing understanding of risks associated with falls. Pt verbalizes an understanding of how to use the call bell and to call for help before getting out of bed.  Skin, clean-dry- intact without evidence of bruising, or skin tears.   No evidence of skin break down noted on exam.  Pt received her second unit of blood from Day.     Will cont to monitor and assist as needed.  Hosie Spangle, [place] 09/09/2020 6:09 PM

## 2020-09-09 NOTE — Anesthesia Procedure Notes (Signed)
Arterial Line Insertion Start/End5/26/2022 8:06 PM, 09/09/2020 8:16 PM  Patient location: Pre-op. Preanesthetic checklist: patient identified, IV checked, site marked, risks and benefits discussed, surgical consent, monitors and equipment checked, pre-op evaluation, timeout performed and anesthesia consent Lidocaine 1% used for infiltration Right, radial was placed Catheter size: 20 Fr Hand hygiene performed  and maximum sterile barriers used   Attempts: 1 Procedure performed without using ultrasound guided technique. Following insertion, dressing applied. Post procedure assessment: normal and unchanged  Patient tolerated the procedure well with no immediate complications.

## 2020-09-09 NOTE — Procedures (Signed)
Interventional Radiology Procedure Note  Procedure:  1. Bilateral Pulmonary thrombectomy 2. Central Venous Catheter  Indication: Massive PE  Findings:   Bilateral pulmonary angiogram and catheter thrombectomy. Large amount of thrombus removed.  Pre thrombectomy PA pressures: 39/15 (26) Post Thrombectomy PA pressures: 27/7 (15)  Please refer to procedural dictation for full description.  Complications: None  EBL: < 10 mL  Miachel Roux, MD [phone]

## 2020-09-09 NOTE — Sedation Documentation (Signed)
27/7 (15) PA Pressure

## 2020-09-09 NOTE — Progress Notes (Signed)
PROGRESS NOTE  Michelle Cain FAO:[id number] DOB: 01-02-1966 DOA: 09/08/2020 PCP: Laurette Schimke, DO  HPI/Recap of past 24 hours: Michelle Cain is a 55 y.o. female with medical history significant of gastric cancer, neuropathy, chronic nausea. Presenting with near syncopal episode. History is from mother at bedside. She reports that the patient woke up this morning and went to the bathroom. She started complaining of being hot and short over breath. She sat down on the toilet and then slumped over the sink. Her mother became concerned and asked what was happening. The patient continued to answer questions, albeit, sluggishly. This went on for about 6 minutes until more family arrived and was able to take her to the ED. During that time she was weak, lightheaded/faint, but did not lose consciousness.  She was noted to have a drop in her Hgb. TRH was called for admission.     Work-up has revealed acute blood loss anemia with hemoglobin of 7.3K and positive FOBT.  Elevated troponin for which cardiology was consulted and 2D echo was obtained.    VQ scan positive for bilateral pulmonary embolism with evidence of severe right heart strain, cor pulmonale on 2D echo.  Heparin drip ordered.  Due to anemia, 2 units PRBCs were ordered to be transfused.  Palliative care medicine consulted for goals of care  Seen by PCCM due to acute pulmonary embolism with severe right heart strain and cor pulmonale.  IR consult for possible thrombolysis.  09/09/20: Patient was seen and examined at her bedside.  She reports dyspnea with minimal exertion.  She denied any pain or nausea at the time of this visit.  Assessment/Plan: Active Problems:   Symptomatic anemia  Near syncope suspect secondary to massive PE. Presented with near syncopal episode at home. Anemic with positive FOBT on presentation requiring 1 unit PRBC transfusion. VQ scan positive for bilateral pulmonary embolism with evidence of severe  right heart strain, cor pulmonale on 2D echo.   Acute bilateral pulmonary embolism with severe right heart strain and cor pulmonale.  PCCM, IR, and pharmacy have been consulted to assist with the management. IR consulted for possible thrombolysis.  Per IR she meets criteria for massive PE. CT angio PE ordered to assess clot burden, follow results. Systemic lytics versus thrombectomy and [facility] pharmacy's assistance with heparin drip dosing.   Started on heparin drip without bolus due to chronic upper GI bleed. Appreciate PCCM assistance with management  Elevated troponin, suspect demand ischemia in the setting of acute bilateral pulmonary embolism with severe right heart strain Heparin drip started on 09/09/2020. Follow CTA results to assess clot burden  Lactic acidosis secondary to acute pulmonary embolism Lactic acid 5.4 on presentation, improved to 1.3.  Metastatic gastric cancer, chronic nausea, massive pulmonary embolism Per EGD done on 08/26/2020 by Dr. Ardis Hughs: Near complete malignant transformation of her gastric mucosa leading to spontaenous bleeding. The gastric lumen is narrowed for the distal 1/3 of the stomach. I do not think she has vomiting from anatomic outlet obstruction, rather the malignant transformation is probably signficantly limiting the normal gastric function, peristalsis. - I think the bleeding risks of resuming her eliquis blood thinner probably outweigh the potential benefits and recommend against ever resuming a blood thinner. - She is not on PPI and I am going to start one, BID. Also will start carafate TID. This may decrease her risk of future bleeding a bit. Patient has a very poor prognosis. Appreciate palliative care team's assistance with goals  of care discussion.  Acute blood loss anemia in the setting of malignancy 2 units PRBC ordered to be transfused. Hemoglobin 7.6 prior to blood transfusion. Obtain H&H post PRBCs  transfusion.    Code Status: Full code.  Family Communication: None at bedside.  Disposition Plan: Undetermined at this time.   Consultants:  PCCM  IR  Hematology/oncology  Palliative care team.  Procedures:  VQ scan 09/09/2020  2D echo 09/09/2020  Antimicrobials:  Initially on admission cefepime Flagyl and IV vancomycin discontinued on 09/09/2020.  DVT prophylaxis: Heparin drip without bolus due to chronic upper GI bleed.  Status is: Inpatient    Dispo:  Patient From: Home  Planned Disposition: To be determined  Medically stable for discharge: No          Objective: Vitals:   09/09/20 1113 09/09/20 1114 09/09/20 1115 09/09/20 1200  BP:      Pulse:      Resp: (!) 24 (!) 32 (!) 33 (!) 31  Temp:    98.5 F (36.9 C)  TempSrc:    Oral  SpO2:      Weight:        Intake/Output Summary (Last 24 hours) at 09/09/2020 1313 Last data filed at 09/09/2020 1123 Gross per 24 hour  Intake 5675.36 ml  Output --  Net 5675.36 ml   Filed Weights   09/08/20 1526  Weight: 74.6 kg    Exam:  . General: 55 y.o. year-old female very frail-appearing in no acute distress.  Alert and oriented x3. . Cardiovascular: Regular rate and rhythm with no rubs or gallops.  No thyromegaly or JVD noted.   Marland Kitchen Respiratory: Clear to auscultation with no wheezes or rales. Good inspiratory effort. . Abdomen: Soft nontender nondistended with normal bowel sounds x4 quadrants. . Musculoskeletal: Trace lower extremity edema. . Skin: No ulcerative lesions noted or rashes . Psychiatry: Mood is appropriate for condition and setting   Data Reviewed: CBC: Recent Labs  Lab 09/08/20 1213 09/09/20 0330 09/09/20 1200  WBC 12.1* 8.8  --   NEUTROABS 9.7*  --   --   HGB 7.3* 7.5* 7.6*  HCT 25.2* 24.4* 25.2*  MCV 93.3 91.7  --   PLT 184 133*  --    Basic Metabolic Panel: Recent Labs  Lab 09/08/20 1213 09/08/20 2133 09/09/20 0330  NA 135  --  136  K 3.9  --  3.9  CL 103  --  107   CO2 22  --  20*  GLUCOSE 185*  --  96  BUN 17  --  14  CREATININE 0.76  --  0.36*  CALCIUM 8.4*  --  7.6*  MG  --  1.8  --    GFR: Estimated Creatinine Clearance: 80.5 mL/min (A) (by C-G formula based on SCr of 0.36 mg/dL (L)). Liver Function Tests: Recent Labs  Lab 09/08/20 1213 09/09/20 0330  AST 24 18  ALT 8 8  ALKPHOS 101 79  BILITOT 0.2* 0.9  PROT 6.7 5.5*  ALBUMIN 2.9* 2.4*   Recent Labs  Lab 09/08/20 1213  LIPASE 17   No results for input(s): AMMONIA in the last 168 hours. Coagulation Profile: No results for input(s): INR, PROTIME in the last 168 hours. Cardiac Enzymes: No results for input(s): CKTOTAL, CKMB, CKMBINDEX, TROPONINI in the last 168 hours. BNP (last 3 results) No results for input(s): PROBNP in the last 8760 hours. HbA1C: No results for input(s): HGBA1C in the last 72 hours. CBG: Recent Labs  Lab 09/08/20  [place]   Lipid Profile: No results for input(s): CHOL, HDL, LDLCALC, TRIG, CHOLHDL, LDLDIRECT in the last 72 hours. Thyroid Function Tests: No results for input(s): TSH, T4TOTAL, FREET4, T3FREE, THYROIDAB in the last 72 hours. Anemia Panel: Recent Labs    09/08/20 2133  TIBC 248*  IRON 42   Urine analysis:    Component Value Date/Time   COLORURINE AMBER (A) 10/22/2019 1000   APPEARANCEUR CLOUDY (A) 10/22/2019 1000   LABSPEC 1.024 10/22/2019 1000   PHURINE 5.0 10/22/2019 1000   GLUCOSEU NEGATIVE 10/22/2019 1000   HGBUR LARGE (A) 10/22/2019 1000   BILIRUBINUR NEGATIVE 10/22/2019 1000   KETONESUR NEGATIVE 10/22/2019 1000   PROTEINUR 100 (A) 03/02/2020 0950   NITRITE NEGATIVE 10/22/2019 1000   LEUKOCYTESUR SMALL (A) 10/22/2019 1000   Sepsis Labs: @LABRCNTIP (procalcitonin:4,lacticidven:4)  ) Recent Results (from the past 240 hour(s))  SARS CORONAVIRUS 2 (TAT 6-24 HRS) Nasopharyngeal Nasopharyngeal Swab     Status: None   Collection Time: 09/08/20 12:46 PM   Specimen: Nasopharyngeal Swab  Result Value Ref Range  Status   SARS Coronavirus 2 NEGATIVE NEGATIVE Final    Comment: (NOTE) SARS-CoV-2 target nucleic acids are NOT DETECTED.  The SARS-CoV-2 RNA is generally detectable in upper and lower respiratory specimens during the acute phase of infection. Negative results do not preclude SARS-CoV-2 infection, do not rule out co-infections with other pathogens, and should not be used as the sole basis for treatment or other patient management decisions. Negative results must be combined with clinical observations, patient history, and epidemiological information. The expected result is Negative.  Fact Sheet for Patients: SugarRoll.be  Fact Sheet for Healthcare Providers: https://www.woods-mathews.com/  This test is not yet approved or cleared by the [place] FDA and  has been authorized for detection and/or diagnosis of SARS-CoV-2 by FDA under an Emergency Use Authorization (EUA). This EUA will remain  in effect (meaning this test can be used) for the duration of the COVID-19 declaration under Se ction 564(b)(1) of the Act, 21 U.S.C. section 360bbb-3(b)(1), unless the authorization is terminated or revoked sooner.  Performed at [facility], [place] [address], [place], [place] [address]   Culture, blood (routine x 2)     Status: None (Preliminary result)   Collection Time: 09/08/20  9:33 PM   Specimen: BLOOD  Result Value Ref Range Status   Specimen Description   Final    BLOOD BLOOD RIGHT HAND Performed at [facility] [address], [place], [place] [address]    Special Requests   Final    BOTTLES DRAWN AEROBIC ONLY Blood Culture adequate volume Performed at [facility] [address] [place], [place] [address]    Culture   Final    NO GROWTH < 12 HOURS Performed at [facility] [address], [place], [place] [address]    Report Status PENDING  Incomplete  Culture, blood (routine x  2)     Status: None (Preliminary result)   Collection Time: 09/08/20  9:33 PM   Specimen: BLOOD  Result Value Ref Range Status   Specimen Description   Final    BLOOD RIGHT WRIST Performed at [facility] [address], [place], [place] [address]    Special Requests   Final    BOTTLES DRAWN AEROBIC ONLY Blood Culture adequate volume Performed at [facility] [address], [place],  [address]    Culture   Final  NO GROWTH < 12 HOURS Performed at [facility] [address], [place], [place] [address]    Report Status PENDING  Incomplete      Studies: NM Pulmonary Perfusion  Result Date: 09/09/2020 CLINICAL DATA:  Deep venous thrombosis, shortness of breath and chest pain for 3 days, elevated D-dimer, high clinical suspicion of pulmonary embolism EXAM: NUCLEAR MEDICINE PERFUSION LUNG SCAN TECHNIQUE: Perfusion images were obtained in multiple projections after intravenous injection of radiopharmaceutical. Ventilation scans intentionally deferred if perfusion scan and chest x-ray adequate for interpretation during COVID 19 epidemic. RADIOPHARMACEUTICALS:  4.25 mCi Tc-42m MAA IV COMPARISON:  Chest radiograph 09/08/2020 FINDINGS: Subsegmental perfusion defects identified within RIGHT middle lobe and lingula. Additional subsegmental perfusion defect LEFT lower lobe. Large multi segmental perfusion defect in RIGHT upper lobe. Findings are consistent with pulmonary embolism. Chest radiograph shows mild atelectasis or infiltrate in the RIGHT lower lobe, remaining lungs clear. IMPRESSION: Multiple BILATERAL pulmonary perfusion defects consistent with pulmonary embolism. Critical Value/emergent results were called by telephone at the time of interpretation on 09/09/2020 at 1200 hrs to provider Dr. Aileen Fass, who verbally acknowledged these results. Electronically Signed   By: Lavonia Dana M.D.   On: 09/09/2020 12:00   ECHOCARDIOGRAM  COMPLETE  Result Date: 09/09/2020    ECHOCARDIOGRAM REPORT   Patient Name:   FREDDA CLARIDA Date of Exam: 09/09/2020 Medical Rec #:  [id number]         Height:       64.0 in Accession #:    [id number]        Weight:       164.5 lb Date of Birth:  June 06, 1965         BSA:          1.800 m Patient Age:    24 years          BP:           91/62 mmHg Patient Gender: F                 HR:           114 bpm. Exam Location:  Inpatient Procedure: 2D Echo, Cardiac Doppler and Color Doppler Indications:    Elevated troponin  History:        Patient has no prior history of Echocardiogram examinations.                 Signs/Symptoms:Syncope and Elevated Troponins. Gastric cancer,                 breast cancer, anemia, Chemo.  Sonographer:    Dustin Flock Referring Phys: [id number] [place]  1. There is sinus tachycardia with severe RV dilation and moderately reduced RV function. 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  2. Left ventricular ejection fraction, by estimation, is 60 to 65%. The left ventricle has normal function. The left ventricle has no regional wall motion abnormalities. Indeterminate diastolic filling due to E-A fusion. There is the interventricular septum is flattened in systole and diastole, consistent with right ventricular pressure and volume overload.  3. Right ventricular systolic function is moderately reduced. The right ventricular size is severely enlarged. There is mildly elevated pulmonary artery systolic pressure. The estimated right ventricular systolic pressure is 83.4 mmHg.  4. The mitral valve is grossly normal. No evidence of mitral valve regurgitation. No evidence of mitral stenosis.  5. The aortic valve is tricuspid. Aortic valve regurgitation is not visualized. No aortic stenosis  is present.  6. The inferior vena cava is normal in size with greater than 50% respiratory variability, suggesting right atrial pressure of 3 mmHg. FINDINGS  Left Ventricle: Left  ventricular ejection fraction, by estimation, is 60 to 65%. The left ventricle has normal function. The left ventricle has no regional wall motion abnormalities. The left ventricular internal cavity size was normal in size. There is  no left ventricular hypertrophy. The interventricular septum is flattened in systole and diastole, consistent with right ventricular pressure and volume overload. Indeterminate diastolic filling due to E-A fusion. Right Ventricle: The right ventricular size is severely enlarged. No increase in right ventricular wall thickness. Right ventricular systolic function is moderately reduced. There is mildly elevated pulmonary artery systolic pressure. The tricuspid regurgitant velocity is 2.93 m/s, and with an assumed right atrial pressure of 3 mmHg, the estimated right ventricular systolic pressure is 16.1 mmHg. Left Atrium: Left atrial size was normal in size. Right Atrium: Right atrial size was normal in size. Pericardium: Trivial pericardial effusion is present. Mitral Valve: The mitral valve is grossly normal. No evidence of mitral valve regurgitation. No evidence of mitral valve stenosis. Tricuspid Valve: The tricuspid valve is grossly normal. Tricuspid valve regurgitation is mild . No evidence of tricuspid stenosis. Aortic Valve: The aortic valve is tricuspid. Aortic valve regurgitation is not visualized. No aortic stenosis is present. Pulmonic Valve: The pulmonic valve was grossly normal. Pulmonic valve regurgitation is not visualized. No evidence of pulmonic stenosis. Aorta: The aortic root and ascending aorta are structurally normal, with no evidence of dilitation. Venous: The inferior vena cava is normal in size with greater than 50% respiratory variability, suggesting right atrial pressure of 3 mmHg. IAS/Shunts: The atrial septum is grossly normal. Additional Comments: A venous catheter is visualized in the right atrium.  LEFT VENTRICLE PLAX 2D LVIDd:         3.20 cm     Diastology  LVIDs:         2.40 cm     LV e' medial:    8.05 cm/s LV PW:         1.10 cm     LV E/e' medial:  6.2 LV IVS:        1.20 cm     LV e' lateral:   7.62 cm/s LVOT diam:     1.80 cm     LV E/e' lateral: 6.5 LV SV:         23 LV SV Index:   13 LVOT Area:     2.54 cm  LV Volumes (MOD) LV vol d, MOD A4C: 47.5 ml LV vol s, MOD A4C: 20.7 ml LV SV MOD A4C:     47.5 ml RIGHT VENTRICLE RV Basal diam:  4.10 cm RV S prime:     8.27 cm/s TAPSE (M-mode): 1.6 cm LEFT ATRIUM             Index       RIGHT ATRIUM          Index LA diam:        2.30 cm 1.28 cm/m  RA Area:     9.61 cm LA Vol (A2C):   14.9 ml 8.28 ml/m  RA Volume:   21.80 ml 12.11 ml/m LA Vol (A4C):   25.8 ml 14.33 ml/m LA Biplane Vol: 21.4 ml 11.89 ml/m  AORTIC VALVE LVOT Vmax:   81.70 cm/s LVOT Vmean:  51.100 cm/s LVOT VTI:    0.092 m  AORTA Ao Root  diam: 2.60 cm MITRAL VALVE               TRICUSPID VALVE MV Area (PHT): 6.22 cm    TR Peak grad:   34.3 mmHg MV Decel Time: 122 msec    TR Vmax:        293.00 cm/s MV E velocity: 49.80 cm/s MV A velocity: 78.20 cm/s  SHUNTS MV E/A ratio:  0.64        Systemic VTI:  0.09 m                            Systemic Diam: 1.80 cm Eleonore Chiquito MD Electronically signed by Eleonore Chiquito MD Signature Date/Time: 09/09/2020/12:04:31 PM    Final     Scheduled Meds: . sodium chloride   Intravenous Once  . sodium chloride   Intravenous Once  . chlorhexidine  15 mL Mouth Rinse BID  . Chlorhexidine Gluconate Cloth  6 each Topical Daily  . dronabinol  2.5 mg Oral BID  . feeding supplement  1 Container Oral TID BM  . mouth rinse  15 mL Mouth Rinse q12n4p  . pantoprazole (PROTONIX) IV  40 mg Intravenous Q12H    Continuous Infusions: . heparin    . lactated ringers Stopped (09/09/20 1103)     LOS: 1 day     Kayleen Memos, MD [facility] Pager [phone]  If 7PM-7AM, please contact night-coverage [email or web address] Password [email or web address] 09/09/2020, 1:13 PM

## 2020-09-09 NOTE — Sedation Documentation (Signed)
PA pressure: 39/15 (26)

## 2020-09-09 NOTE — Sedation Documentation (Signed)
ACT: 309

## 2020-09-09 NOTE — Progress Notes (Signed)
Initial Nutrition Assessment  DOCUMENTATION CODES:   Not applicable  INTERVENTION:  - recommend J-tube placement vs initiation of TPN. - complete NFPE when feasible. - patient at high risk for re-feeding when/if nutrition support is initiated.    NUTRITION DIAGNOSIS:   Increased nutrient needs related to cancer and cancer related treatments as evidenced by estimated needs.  GOAL:   Patient will meet greater than or equal to 90% of their needs  MONITOR:   Labs,Weight trends,Other (Comment) (plan concerning nutrition)  REASON FOR ASSESSMENT:   Malnutrition Screening Tool  ASSESSMENT:   55 year-old female with medical history of lupus, raynaud's, anemia, fibromyalgia, arthritis, colon and breast cancers, and gastric cancer. She presented to the ED after a near syncopal episode.  She has been NPO since admission. Patient currently out of the room and location listed is Vascular Lab.   She has a port in L chest which was placed on 09/11/17.   Note in MST report indicates patient stated she has lost 30 lb in the past 6 months. Weight yesterday was 164 lb and weight on 08/11/20 was 171 lb. This indicates 7 lb weight loss (4% body weight) in the past 1 month.   Palliative and GI following. Patient remains Full Code. Notes indicate that nearly the entirety of her gastric mucosa has been replaced with friable, ulcerated, spontaneously oozing cancer.    Labs reviewed; creatinine: 0.36 mg/dl, Ca: 7.6 mg/dl. Medications reviewed; 2.5 mg marinol BID, 40 mg IV protonix BID. IVF; LR @ 125 ml/hr.     NUTRITION - FOCUSED PHYSICAL EXAM:  unable to complete at this time.   Diet Order:   Diet Order            Diet NPO time specified Except for: Sips with Meds  Diet effective now                 EDUCATION NEEDS:   Not appropriate for education at this time  Skin:  Skin Assessment: Reviewed RN Assessment  Last BM:  PTA/unknown  Height:   Ht Readings from Last 1  Encounters:  09/01/20 5\' 4"  (1.626 m)    Weight:   Wt Readings from Last 1 Encounters:  09/08/20 74.6 kg    Estimated Nutritional Needs:  Kcal:  2100-2300 kcal Protein:  100-115 grams Fluid:  >/= 2 L/day      Jarome Matin, MS, RD, LDN, CNSC Inpatient Clinical Dietitian RD pager # available in [place]  After hours/weekend pager # available in [facility]

## 2020-09-09 NOTE — TOC Initial Note (Signed)
Transition of Care [facility]) - Initial/Assessment Note    Patient Details  Name: Michelle Cain MRN: [id number] Date of Birth: [birth year]  Transition of Care [facility]) CM/SW Contact:    Leeroy Cha, RN Phone Number: 09/09/2020, 7:35 AM  Clinical Narrative:                  55 y.o. female with medical history significant of gastric cancer, neuropathy, chronic nausea. Presenting with near syncopal episode. History is from mother at bedside. She reports that the patient woke up this morning and went to the bathroom. She started complaining of being hot and short over breath. She sat down on the toilet and then slumped over the sink. Her mother became concerned and asked what was happening. The patient continued to answer questions, albeit, sluggishly. This went on for about 6 minutes until more family arrived and was able to take her to the ED. During that time she was weak, lightheaded/faint, but did not lose consciousness.     ED Course: She was noted to have a drop in her Hgb. 1 unit of pRBCs were ordered. PLAN: hgb=7.5 , plan: to return to home with self care. Expected Discharge Plan: Home/Self Care Barriers to Discharge: Continued Medical Work up   Patient Goals and CMS Choice Patient states their goals for this hospitalization and ongoing recovery are:: TO [place] CMS Medicare.gov Compare Post Acute Care list provided to:: Patient    Expected Discharge Plan and Services Expected Discharge Plan: Home/Self Care   Discharge Planning Services: CM Consult   Living arrangements for the past 2 months: Single Family Home                                      Prior Living Arrangements/Services Living arrangements for the past 2 months: Single Family Home Lives with:: Self Patient language and need for interpreter reviewed:: Yes Do you feel safe going back to the place where you live?: Yes            Criminal Activity/Legal Involvement Pertinent to Current  Situation/Hospitalization: No - Comment as needed  Activities of Daily Living Home Assistive Devices/Equipment: Cane (specify quad or straight),Walker (specify type),Bedside commode/3-in-1,Shower chair with back ADL Screening (condition at time of admission) Patient's cognitive ability adequate to safely complete daily activities?: Yes Is the patient deaf or have difficulty hearing?: No Does the patient have difficulty seeing, even when wearing glasses/contacts?: No Does the patient have difficulty concentrating, remembering, or making decisions?: No Patient able to express need for assistance with ADLs?: Yes Does the patient have difficulty dressing or bathing?: No Independently performs ADLs?: No Communication: Independent Dressing (OT): Needs assistance Is this a change from baseline?: Pre-admission baseline Grooming: Needs assistance Is this a change from baseline?: Pre-admission baseline Feeding: Independent Bathing: Needs assistance Is this a change from baseline?: Pre-admission baseline Toileting: Needs assistance Is this a change from baseline?: Pre-admission baseline In/Out Bed: Needs assistance Is this a change from baseline?: Pre-admission baseline Walks in Home: Dependent Is this a change from baseline?: Pre-admission baseline Does the patient have difficulty walking or climbing stairs?: Yes Weakness of Legs: Both Weakness of Arms/Hands: Both  Permission Sought/Granted                  Emotional Assessment Appearance:: Appears stated age     Orientation: : Oriented to Self,Oriented to Place,Oriented to  Time,Oriented  to Situation Alcohol / Substance Use: Not Applicable Psych Involvement: No (comment)  Admission diagnosis:  Dehydration [E86.0] AKI (acute kidney injury) ([place]) [N17.9] Symptomatic anemia [D64.9] Patient Active Problem List   Diagnosis Date Noted  . Symptomatic anemia 09/08/2020  . Hypokalemia 05/12/2020  . Hyponatremia 05/12/2020  . CAP  (community acquired pneumonia) 05/11/2020  . Port-A-Cath in place 10/22/2019  . Genetic testing 09/19/2019  . Malignant neoplasm of stomach ([place]) 08/26/2019  . Goals of care, counseling/discussion 08/26/2019  . Plantar fasciitis 07/31/2012  . Right knee pain 05/28/2012   PCP:  Laurette Schimke, DO Pharmacy:   [facility], [place] [place] [address] Phone: [phone] Fax: [phone]     Social Determinants of Health (SDOH) Interventions    Readmission Risk Interventions No flowsheet data found.

## 2020-09-09 NOTE — Sedation Documentation (Signed)
ACT = 172

## 2020-09-09 NOTE — Sedation Documentation (Signed)
Procedure continues 

## 2020-09-09 NOTE — Progress Notes (Signed)
Lower extremity venous has been completed.   Preliminary results in CV Proc.   Michelle Cain 09/09/2020 3:37 PM

## 2020-09-09 NOTE — Progress Notes (Signed)
  Echocardiogram 2D Echocardiogram has been performed.  Matilde Bash 09/09/2020, 10:50 AM

## 2020-09-09 NOTE — Transfer of Care (Signed)
Immediate Anesthesia Transfer of Care Note  Patient: Clariece Bracey-Bady  Procedure(s) Performed: PULMONARY EMBOLUS THROMBECTOMY (N/A )  Patient Location: PACU  Anesthesia Type:General  Level of Consciousness: awake and alert   Airway & Oxygen Therapy: Patient Spontanous Breathing  Post-op Assessment: Report given to RN and Post -op Vital signs reviewed and stable  Post vital signs: Reviewed and stable  Last Vitals:  Vitals Value Taken Time  BP    Temp    Pulse    Resp 15 09/09/20 2252  SpO2    Vitals shown include unvalidated device data.  Last Pain:  Vitals:   09/09/20 2008  TempSrc:   PainSc: 0-No pain      Patients Stated Pain Goal: 7 (35/00/93 8182)  Complications: No complications documented.

## 2020-09-09 NOTE — Sedation Documentation (Addendum)
Report called to [facility], ICU RN. Pt tolerated procedure well. Vitals remain stable at this time. Anesthesia, Rebekah, present for case. Will assist with transport to ICU

## 2020-09-10 ENCOUNTER — Encounter (HOSPITAL_COMMUNITY): Payer: Self-pay | Admitting: Radiology

## 2020-09-10 ENCOUNTER — Inpatient Hospital Stay (HOSPITAL_COMMUNITY): Payer: Medicare Other

## 2020-09-10 DIAGNOSIS — R609 Edema, unspecified: Secondary | ICD-10-CM

## 2020-09-10 DIAGNOSIS — D649 Anemia, unspecified: Secondary | ICD-10-CM

## 2020-09-10 DIAGNOSIS — D500 Iron deficiency anemia secondary to blood loss (chronic): Secondary | ICD-10-CM

## 2020-09-10 DIAGNOSIS — C168 Malignant neoplasm of overlapping sites of stomach: Secondary | ICD-10-CM | POA: Diagnosis not present

## 2020-09-10 DIAGNOSIS — K921 Melena: Secondary | ICD-10-CM

## 2020-09-10 DIAGNOSIS — I2699 Other pulmonary embolism without acute cor pulmonale: Secondary | ICD-10-CM | POA: Diagnosis not present

## 2020-09-10 LAB — HEPARIN LEVEL (UNFRACTIONATED)
Heparin Unfractionated: 0.7 IU/mL (ref 0.30–0.70)
Heparin Unfractionated: <0.1 IU/mL — ABNORMAL LOW (ref 0.30–0.70)
Heparin Unfractionated: <0.1 IU/mL — ABNORMAL LOW (ref 0.30–0.70)
Heparin Unfractionated: 0.89 IU/mL — ABNORMAL HIGH (ref 0.30–0.70)

## 2020-09-10 LAB — CBC
HCT: 28.4 % — ABNORMAL LOW (ref 36.0–46.0)
HCT: 28.1 % — ABNORMAL LOW (ref 36.0–46.0)
HCT: 19.6 % — ABNORMAL LOW (ref 36.0–46.0)
Hemoglobin: 9.2 g/dL — ABNORMAL LOW (ref 12.0–15.0)
Hemoglobin: 9 g/dL — ABNORMAL LOW (ref 12.0–15.0)
Hemoglobin: 6.3 g/dL — CL (ref 12.0–15.0)
MCH: 29.5 pg (ref 26.0–34.0)
MCH: 29 pg (ref 26.0–34.0)
MCH: 29.9 pg (ref 26.0–34.0)
MCHC: 32.4 g/dL (ref 30.0–36.0)
MCHC: 32 g/dL (ref 30.0–36.0)
MCHC: 32.1 g/dL (ref 30.0–36.0)
MCV: 91 fL (ref 80.0–100.0)
MCV: 90.6 fL (ref 80.0–100.0)
MCV: 92.9 fL (ref 80.0–100.0)
Platelets: 124 10*3/uL — ABNORMAL LOW (ref 150–400)
Platelets: 127 10*3/uL — ABNORMAL LOW (ref 150–400)
Platelets: 96 10*3/uL — ABNORMAL LOW (ref 150–400)
RBC: 3.12 MIL/uL — ABNORMAL LOW (ref 3.87–5.11)
RBC: 3.1 MIL/uL — ABNORMAL LOW (ref 3.87–5.11)
RBC: 2.11 MIL/uL — ABNORMAL LOW (ref 3.87–5.11)
RDW: 18.2 % — ABNORMAL HIGH (ref 11.5–15.5)
RDW: 18.1 % — ABNORMAL HIGH (ref 11.5–15.5)
RDW: 18.5 % — ABNORMAL HIGH (ref 11.5–15.5)
WBC: 9 10*3/uL (ref 4.0–10.5)
WBC: 8 10*3/uL (ref 4.0–10.5)
WBC: 6.6 10*3/uL (ref 4.0–10.5)
nRBC: 2.5 % — ABNORMAL HIGH (ref 0.0–0.2)
nRBC: 3.4 % — ABNORMAL HIGH (ref 0.0–0.2)
nRBC: 4.1 % — ABNORMAL HIGH (ref 0.0–0.2)

## 2020-09-10 LAB — HEMOGLOBIN AND HEMATOCRIT, BLOOD
HCT: 26.6 % — ABNORMAL LOW (ref 36.0–46.0)
HCT: 25.8 % — ABNORMAL LOW (ref 36.0–46.0)
HCT: 26.1 % — ABNORMAL LOW (ref 36.0–46.0)
HCT: 25.1 % — ABNORMAL LOW (ref 36.0–46.0)
Hemoglobin: 8.6 g/dL — ABNORMAL LOW (ref 12.0–15.0)
Hemoglobin: 8.1 g/dL — ABNORMAL LOW (ref 12.0–15.0)
Hemoglobin: 8.1 g/dL — ABNORMAL LOW (ref 12.0–15.0)
Hemoglobin: 8 g/dL — ABNORMAL LOW (ref 12.0–15.0)

## 2020-09-10 LAB — GLUCOSE, CAPILLARY
Glucose-Capillary: 101 mg/dL — ABNORMAL HIGH (ref 70–99)
Glucose-Capillary: 103 mg/dL — ABNORMAL HIGH (ref 70–99)
Glucose-Capillary: 106 mg/dL — ABNORMAL HIGH (ref 70–99)
Glucose-Capillary: 109 mg/dL — ABNORMAL HIGH (ref 70–99)
Glucose-Capillary: 96 mg/dL (ref 70–99)
Glucose-Capillary: 97 mg/dL (ref 70–99)
Glucose-Capillary: 97 mg/dL (ref 70–99)

## 2020-09-10 LAB — PATHOLOGIST SMEAR REVIEW

## 2020-09-10 LAB — HEMOGLOBIN A1C
Hgb A1c MFr Bld: 5.3 % (ref 4.8–5.6)
Mean Plasma Glucose: 105 mg/dL

## 2020-09-10 LAB — POCT ACTIVATED CLOTTING TIME
Activated Clotting Time: 172 seconds
Activated Clotting Time: 309 seconds

## 2020-09-10 LAB — APTT
aPTT: 199 seconds (ref 24–36)
aPTT: 47 seconds — ABNORMAL HIGH (ref 24–36)

## 2020-09-10 MED ORDER — SUCRALFATE 1 GM/10ML PO SUSP
1.0000 g | Freq: Three times a day (TID) | ORAL | Status: DC
  Administered 2020-09-10 – 2020-09-14 (×16): 1 g via ORAL
  Filled 2020-09-10 (×16): qty 10

## 2020-09-10 MED ORDER — DEXTROSE IN LACTATED RINGERS 5 % IV SOLN: INTRAVENOUS | Status: AC

## 2020-09-10 MED ORDER — DEXTROSE 50 % IV SOLN
25.0000 g | INTRAVENOUS | Status: AC
  Administered 2020-09-10: 25 g via INTRAVENOUS

## 2020-09-10 MED ORDER — DEXTROSE 50 % IV SOLN
INTRAVENOUS | Status: AC
  Filled 2020-09-10: qty 50

## 2020-09-10 MED ORDER — SODIUM CHLORIDE 0.9% FLUSH: 10.0000 mL | INTRAVENOUS | Status: DC | PRN

## 2020-09-10 MED ORDER — SODIUM CHLORIDE 0.9% FLUSH
10.0000 mL | Freq: Two times a day (BID) | INTRAVENOUS | Status: DC
  Administered 2020-09-10 – 2020-09-13 (×8): 10 mL

## 2020-09-10 NOTE — Progress Notes (Signed)
Underwent catheter thrombectomy with IR yesterday, large amount of thrombus removed.  Would check limited echo as she recovers to evaluate for improvement in R heart strain.  For troponin elevation, suspect demand ischemia in setting of anemia and right heart strain due to pulmonary embolism.  No further cardiac work-up recommended at this time.  We will sign off, but please call if can be of further assistance.  Donato Heinz, MD

## 2020-09-10 NOTE — Progress Notes (Signed)
Right upper extremity venous duplex completed. Refer to "CV Proc" under chart review to view preliminary results.  Preliminary results discussed with Jacqulyn Bath, RN.  09/10/2020 1:08PM Kelby Aline., MHA, RVT, RDCS, RDMS

## 2020-09-10 NOTE — Anesthesia Postprocedure Evaluation (Signed)
Anesthesia Post Note  Patient: Anitta Bracey-Bady  Procedure(s) Performed: PULMONARY EMBOLUS THROMBECTOMY (N/A )     Patient location during evaluation: SICU Anesthesia Type: MAC Level of consciousness: awake and alert Pain management: pain level controlled Vital Signs Assessment: post-procedure vital signs reviewed and stable Respiratory status: spontaneous breathing and respiratory function stable Cardiovascular status: stable Postop Assessment: no apparent nausea or vomiting Anesthetic complications: no   No complications documented.  Last Vitals:  Vitals:   09/10/20 0021 09/10/20 0030  BP:    Pulse:  98  Resp:  (!) 25  Temp: 36.7 C   SpO2:  96%    Last Pain:  Vitals:   09/10/20 0021  TempSrc: Oral  PainSc:                  Amare Bail DANIEL

## 2020-09-10 NOTE — Progress Notes (Signed)
Right CFV purse string suture removed in tact at bedside today without complication. No active bleeding upon removal.  4x4 gauze + tegaderm applied.  Dressing to remain x 24H then may remove. If bleeding occurs hold firm pressure at right CFV x 15 minutes and re-dress.  Right IJ non tunneled central line placed in IR during thrombectomy may be removed on floor per primary service's discretion.   IR remains available as needed, please call with questions or concerns.  Candiss Norse, PA-C

## 2020-09-10 NOTE — Progress Notes (Addendum)
NAME:  Michelle Cain, MRN:  [id number], DOB:  Jul 30, 1965, LOS: 2 ADMISSION DATE:  09/08/2020, CONSULTATION DATE:  5/26 REFERRING MD:  Dr. Nevada Crane, CHIEF COMPLAINT:  PE  History of Present Illness:  55 year old female with PMH as below, which is significant for gastric cancer managed by Dr. Benay Spice. She has been undergoing chemotherapy continuously since 2019 with last treatment on 4/5. She underwent endoscopy 5/12 which, despite chemotherapy, showed the entire gastric mucosa to have been replaced by malignancy. She was seen in the oncology office 5/18 and she was referred to a tertiary center. Then 5/24 she presented to [facility] ED with complaints of near syncope. She became diaphoretic and dyspneic while using the restroom and briefly slumped over the sink.   In the ED she complained of progressive dyspnea for about one month prior, which grew significantly worse in the days leading to her presentation. Upon arrival to the emergency department she was oxygenating well, was tachycardic, and had borderline hypotension. laboratory evaluation was significant for a 2 gram drop in her hemoglobin from one week prior. She was admitted to the hospitalist service for symptomatic anemia presumably from GI bleeding, and transfusion was initiated. GI was involved and did not recommend any further intervention endoscopically. As part of her dyspnea workup, a V/Q scan showed multiple bilateral perfusion defects consistent with pulmonary embolism. Echocardiogram consistent with acute cor pulmonale. PCCM was consulted for recommendations regarding PE.   Pertinent  Medical History   has a past medical history of Anemia, Arthritis, Breast CA ([place]) (dx'd 2007), Carpal tunnel syndrome on both sides, DVT (deep venous thrombosis) ([place]), Dyspnea, Fibromyalgia, History of blood transfusion (08/17/2020), Lupus ([place]), Pneumonia, Raynaud disease, and stomach/colon ca (dx'd 2020).   Significant Hospital Events: Including  procedures, antibiotic start and stop dates in addition to other pertinent events   . 8/25 admit for syncope/symptomatic anemia . 8/26 PE diagnosed, Tx to ICU. PCCM consulted. To [facility] for thrombectomy per IR . 8/27 On RA  Interim History / Subjective:  Pt reports feeling better.  Denies chest pain, SOB  Objective   Blood pressure 109/74, pulse (!) 102, temperature 98.6 F (37 C), temperature source Oral, resp. rate 19, weight 74.6 kg, SpO2 98 %.        Intake/Output Summary (Last 24 hours) at 09/10/2020 6433 Last data filed at 09/10/2020 0700 Gross per 24 hour  Intake 2621.7 ml  Output 850 ml  Net 1771.7 ml   Filed Weights   09/08/20 1526  Weight: 74.6 kg    Examination: General: adult female sitting up in bed in NAD   HEENT: MM pink/moist, anicteric  Neuro: AAOx4, MAE  CV: s1s2 RRR, no m/r/g PULM: non-labored on RA, lungs bilaterally clear GI: soft, bsx4 active  Extremities: warm/dry, no edema  Skin: no rashes or lesions   Labs/imaging that I havepersonally reviewed  (right click and "Reselect all SmartList Selections" daily)   5/26 V/Q > Multiple BILATERAL pulmonary perfusion defects consistent with pulmonary embolism.   Echo: severe RV dilation and moderately reduced RV function. Intraventricular septal flattening. LVEF 60-65%.  BMP CBC - platelets 127   Resolved Hospital Problem list     Assessment & Plan:   Gastric Cancer Followed by Dr. Benay Spice who has been on chemotherapy since 2019. Endoscopy earlier this month shows near complete replacement of gastric mucosa by malignancy causing spontaneous bleeding. Patient was hoping to seek second opinion at [facility].  -Appreciate ONC / GI input  -cautious monitoring while on  heparin gtt  -PPI BID + carafate TID with anticoagulation -continue marinol  -PRN oxycodone  -ok to advance to soft diet as tolerated per GI (low residue)  Acute massive Pulmonary Embolism DVT  History of DVT. Syncope at home. PE diagnosed  on VQ 5/16. High risk by PESI score 133. Acute cor pulmonale described on echocardiogram. -continue heparin infusion per pharmacy, high risk for bleeding with gastric cancer  -?if she should have IVC filter placed / timing? For LE DVT -appreciate IR assistance for thrombectomy  -ICU monitoring -D5LR at 50ml/hr (period of NPO, poor intake)  Elevated troponin Likely represents demand ischemia due to anemia/PE -appreciate Cardiology -not a candidate for DAPT   Acute blood loss anemia  Thrombocytopenia Secondary to gastric cancer -monitor Hgb -transfuse for Hgb <7%  Goals of care -appreciate Palliative Care    -patient wants full scope therapy for now   Best practice (right click and "Reselect all SmartList Selections" daily)  Diet:  Oral Pain/Anxiety/Delirium protocol (if indicated): No VAP protocol (if indicated): Not indicated DVT prophylaxis: Systemic AC GI prophylaxis: PPI Glucose control:  SSI No Central venous access:  N/A Arterial line:  N/A Foley:  N/A Mobility:  bed rest  PT consulted: N/A Last date of multidisciplinary goals of care discussion: ongoing  Code Status:  full code Disposition: ICU, prognosis guarded.     Critical care time: 30 minutes    Noe Gens, MSN, APRN, NP-C, AGACNP-BC [place] 09/10/2020, 7:42 AM   Please see [email or web address] for pager details.   From 7A-7P if no response, please call [phone] After hours, please call ELink [phone]

## 2020-09-10 NOTE — Progress Notes (Addendum)
ANTICOAGULATION CONSULT NOTE - Follow Up Consult  Pharmacy Consult for heparin Indication: pulmonary embolus  Labs: Recent Labs    09/08/20 1213 09/08/20 1413 09/09/20 0330 09/09/20 1200 09/09/20 1818 09/09/20 1908 09/09/20 2217 09/09/20 2221 09/10/20 0049 09/10/20 0057 09/10/20 0400 09/10/20 0500 09/10/20 0938 09/10/20 1110  HGB 7.3*  --  7.5*   < >  --    < >  --   --   --  9.2* 9.0*  --  8.6* 6.3*  HCT 25.2*  --  24.4*   < >  --    < >  --   --   --  28.4* 28.1*  --  26.6* 19.6*  PLT 184  --  133*  --   --    < >  --   --   --  124* 127*  --   --  96*  APTT  --   --   --   --   --   --  >200*  --  199*  --   --  47*  --   --   LABPROT  --   --   --   --  13.6  --   --   --   --   --   --   --   --   --   INR  --   --   --   --  1.0  --   --   --   --   --   --   --   --   --   HEPARINUNFRC  --   --   --   --  0.40  --   --    < > 0.70  --   --  <0.10*  --  <0.10*  CREATININE 0.76  --  0.36*  --   --   --   --   --   --   --   --   --   --   --   TROPONINIHS 260* 448*  --   --   --   --   --   --   --   --   --   --   --   --    < > = values in this interval not displayed.    Assessment: 55 yo female presented on 09/07/2020 for near syncope. Patient found to have PE with RHS now s/p thrombectomy. Patient with PHM of gastric cancer. Patient also with PMH of DVT and recent EGD for hematemesis due to apixaban use which was stopped.   S/p thrombectomy on 09/09/2020 for PE. Heparin held after due to high ACT and resumed this morning based on aPTT.  Heparin level undetectable on heparin 1000 units/hr (drawn 1hr early). Per RN no issues with IV infusion or access. Hgb down to 6.3 on CBC. Heparin infusion continued and repeat H&H ordered. Repeat H&H hgb 8.1. Of note, heparin rate was not increased while awaiting repeat H&H per discussion with CCM. RN reported some slight hematuria which from report was prior to admission as well. No other bleeding noted.  Goal of Therapy:  Heparin  level 0.3 - 0.5 unit/mL; No bolus Monitor platelets by anticoagulation protocol: Yes   Plan:  Increase heparin to 1100 units/hr No bolus due to bleeding risk Check 6 hr heparin level and H&H Monitor heparin level, CBC, and s/s of bleeding daily  Monitor bleeding closely - very high  risk of bleeding    Cristela Felt, PharmD Clinical Pharmacist  09/10/2020,12:44 PM

## 2020-09-10 NOTE — Progress Notes (Addendum)
ANTICOAGULATION CONSULT NOTE - Follow Up Consult  Pharmacy Consult for heparin Indication: pulmonary embolus and LLE + RUE DVTs  Labs: Recent Labs    09/08/20 1213 09/08/20 1413 09/09/20 0330 09/09/20 1200 09/09/20 1818 09/09/20 1908 09/09/20 2217 09/09/20 2221 09/10/20 0049 09/10/20 0057 09/10/20 0400 09/10/20 0500 09/10/20 0938 09/10/20 1110 09/10/20 1221 09/10/20 1626 09/10/20 2100  HGB 7.3*  --  7.5*   < >  --    < >  --   --   --  9.2* 9.0*  --    < > 6.3* 8.1* 8.1* 8.0*  HCT 25.2*  --  24.4*   < >  --    < >  --   --   --  28.4* 28.1*  --    < > 19.6* 25.8* 26.1* 25.1*  PLT 184  --  133*  --   --    < >  --   --   --  124* 127*  --   --  96*  --   --   --   APTT  --   --   --   --   --   --  >200*  --  199*  --   --  47*  --   --   --   --   --   LABPROT  --   --   --   --  13.6  --   --   --   --   --   --   --   --   --   --   --   --   INR  --   --   --   --  1.0  --   --   --   --   --   --   --   --   --   --   --   --   HEPARINUNFRC  --   --   --   --  0.40  --   --    < > 0.70  --   --  <0.10*  --  <0.10*  --   --  0.89*  CREATININE 0.76  --  0.36*  --   --   --   --   --   --   --   --   --   --   --   --   --   --   TROPONINIHS 260* 448*  --   --   --   --   --   --   --   --   --   --   --   --   --   --   --    < > = values in this interval not displayed.    Assessment: 55yo female now supratherapeutic on heparin after conservative rate change; no gtt issues or signs of bleeding per RN; urine remains tea-colored, no change.  Goal of Therapy:  Heparin level 0.3-0.5 units/ml   Plan:  Will decrease heparin gtt back 1000 units/hr and check level in 6 hours.      Wynona Neat, PharmD, BCPS  09/10/2020,11:40 PM   Addendum: Heparin level now higher despite decreased rate.  No overt signs of bleeding though Hgb down half a point.  Will hold heparin gtt x1h then decrease heparin gtt by 3 units/kg/hr to 800 units/hr and check level in 6 hours.   VB  09/11/2020  6:49 AM

## 2020-09-10 NOTE — Progress Notes (Signed)
[facility] Progress Note  Chief Complaint: Upper GI bleeding, gastric cancer  History: I performed chart review and got signout on this patient from my partner Dr. Ardis Hughs.  Michelle Cain was seen by Dr. Ardis Hughs in consult at [facility] yesterday, then transferred here and kept for monitoring after her pulmonary artery thrombectomy. She is on unfractionated heparin, thus far no reports of melena no hematemesis.  Patient says she is hungry and would like to eat if possible.  Chronic upper abdominal pain and bloating  She has done very well since the thrombectomy, breathing comfortably on room air at present.  ROS: Cardiovascular: Currently denies chest Respiratory: Feels like her breathing is stable   Objective:   Current Facility-Administered Medications:  .  0.9 %  sodium chloride infusion (Manually program via Guardrails IV Fluids), , Intravenous, Once, Mir, Paula Libra, MD .  acetaminophen (TYLENOL) tablet 650 mg, 650 mg, Oral, Q6H PRN **OR** acetaminophen (TYLENOL) suppository 650 mg, 650 mg, Rectal, Q6H PRN, Marylyn Ishihara, Tyrone A, DO .  chlorhexidine (PERIDEX) 0.12 % solution 15 mL, 15 mL, Mouth Rinse, BID, Hall, Carole N, DO, 15 mL at 09/10/20 0923 .  Chlorhexidine Gluconate Cloth 2 % PADS 6 each, 6 each, Topical, Daily, Irene Pap N, DO, 6 each at 09/09/20 1249 .  dextrose 5 % in lactated ringers infusion, , Intravenous, Continuous, Ogan, Okoronkwo U, MD, Last Rate: 50 mL/hr at 09/10/20 1000, Infusion Verify at 09/10/20 1000 .  dronabinol (MARINOL) capsule 2.5 mg, 2.5 mg, Oral, BID, Kyle, Tyrone A, DO, 2.5 mg at 09/10/20 0925 .  feeding supplement (BOOST / RESOURCE BREEZE) liquid 1 Container, 1 Container, Oral, TID BM, Kyle, Tyrone A, DO, 1 Container at 09/10/20 [phone] .  heparin ADULT infusion 100 units/mL (25000 units/255mL), 1,000 Units/hr, Intravenous, Continuous, Laren Everts, RPH, Last Rate: 10 mL/hr at 09/10/20 1000, 1,000 Units/hr at 09/10/20 1000 .  MEDLINE mouth rinse, 15 mL, Mouth  Rinse, q12n4p, Hall, Carole N, DO .  ondansetron (ZOFRAN) injection 4 mg, 4 mg, Intravenous, Q8H PRN, Kyle, Tyrone A, DO, 4 mg at 09/10/20 0440 .  oxyCODONE (Oxy IR/ROXICODONE) immediate release tablet 5 mg, 5 mg, Oral, Q4H PRN, Kyle, Tyrone A, DO, 5 mg at 09/10/20 0441 .  pantoprazole (PROTONIX) injection 40 mg, 40 mg, Intravenous, Q12H, Kyle, Tyrone A, DO, 40 mg at 09/10/20 0924 .  promethazine (PHENERGAN) tablet 12.5 mg, 12.5 mg, Oral, Q6H PRN, Marylyn Ishihara, Tyrone A, DO, 12.5 mg at 09/10/20 0924 .  sodium chloride flush (NS) 0.9 % injection 10-40 mL, 10-40 mL, Intracatheter, Q12H, Candee Furbish, MD, 10 mL at 09/10/20 1045 .  sodium chloride flush (NS) 0.9 % injection 10-40 mL, 10-40 mL, Intracatheter, PRN, Candee Furbish, MD .  sucralfate (CARAFATE) 1 GM/10ML suspension 1 g, 1 g, Oral, TID WC & HS, Ollis, Brandi L, NP  Facility-Administered Medications Ordered in Other Encounters:  .  sodium chloride flush (NS) 0.9 % injection 10 mL, 10 mL, Intravenous, PRN, Ladell Pier, MD, 10 mL at 09/17/19 1109  . dextrose 5% lactated ringers 50 mL/hr at 09/10/20 1000  . heparin 1,000 Units/hr (09/10/20 1000)     Vital signs in last 24 hrs: Vitals:   09/10/20 1000 09/10/20 1030  BP:    Pulse: (!) 110 (!) 110  Resp:    Temp:    SpO2:      Intake/Output Summary (Last 24 hours) at 09/10/2020 1113 Last data filed at 09/10/2020 1000 Gross per 24 hour  Intake 2910.27 ml  Output 1100 ml  Net 1810.27 ml     Physical Exam Chronically ill-appearing woman, laying in bed, alert and conversational, son at the bedside.  Cardiac: RRR without murmurs, S1S2 heard, no peripheral edema  Pulm: clear to auscultation bilaterally, normal RR and effort noted  Abdomen: soft, no tenderness, with active bowel sounds. No guarding or palpable hepatosplenomegaly  Skin; warm and dry, no jaundice  Recent Labs:  CBC Latest Ref Rng & Units 09/10/2020 09/10/2020 09/10/2020  WBC 4.0 - 10.5 K/uL - 8.0 9.0  Hemoglobin  12.0 - 15.0 g/dL 8.6(L) 9.0(L) 9.2(L)  Hematocrit 36.0 - 46.0 % 26.6(L) 28.1(L) 28.4(L)  Platelets 150 - 400 K/uL - 127(L) 124(L)    Recent Labs  Lab 09/09/20 1818  INR 1.0   CMP Latest Ref Rng & Units 09/09/2020 09/08/2020 09/01/2020  Glucose 70 - 99 mg/dL 96 185(H) 116(H)  BUN 6 - 20 mg/dL 14 17 11   Creatinine 0.44 - 1.00 mg/dL 0.36(L) 0.76 0.43(L)  Sodium 135 - 145 mmol/L 136 135 136  Potassium 3.5 - 5.1 mmol/L 3.9 3.9 4.0  Chloride 98 - 111 mmol/L 107 103 104  CO2 22 - 32 mmol/L 20(L) 22 24  Calcium 8.9 - 10.3 mg/dL 7.6(L) 8.4(L) 8.5(L)  Total Protein 6.5 - 8.1 g/dL 5.5(L) 6.7 6.5  Total Bilirubin 0.3 - 1.2 mg/dL 0.9 0.2(L) 0.4  Alkaline Phos 38 - 126 U/L 79 101 85  AST 15 - 41 U/L 18 24 16   ALT 0 - 44 U/L 8 8 7      Radiologic studies: IR study reports reviewed  Assessment & Plan  Assessment: Gastric cancer, extensive involvement seen on recent upper endoscopy with Dr. Ardis Hughs Recent melena Acute on chronic GI blood loss.  Patient has done well after pulmonary artery thrombectomy, but remains very sick with advanced gastric malignancy and ongoing risk of GI bleeding, particularly on anticoagulation.  I concur with Dr. Ardis Hughs that there is unfortunately no endoscopic treatment to offer for the ongoing tumor bleeding she is likely to have. I understand there is some discussion of IVC filter placement. She can have a solid food diet for my perspective,, discussed with PCCM NP.  GI service signing off. Nelida Meuse III Office: [phone]

## 2020-09-10 NOTE — Progress Notes (Signed)
eLink Physician-Brief Progress Note Patient Name: Michelle Cain DOB: 04-Mar-1966 MRN: [id number]   Date of Service  09/10/2020  HPI/Events of Note  Patient is s/p mechanical thrombectomy for bilateral PE, she has to lay flat until 4 a.m.  eICU Interventions  Will keep her NPO and start a D5 LR infusion at 60 ml / hour.         Kerry Kass Flynn Gwyn 09/10/2020, 12:08 AM

## 2020-09-10 NOTE — Progress Notes (Signed)
RN reports Hgb resulted at 6.3.  No evidence of bleeding currently.  Repeat STAT H/H.        Noe Gens, MSN, APRN, NP-C, AGACNP-BC [facility] 09/10/2020, 12:16 PM   Please see [email or web address] for pager details.   From 7A-7P if no response, please call [phone] After hours, please call ELink [phone]

## 2020-09-10 NOTE — Progress Notes (Signed)
ANTICOAGULATION CONSULT NOTE - Follow Up Consult  Pharmacy Consult for heparin Indication: pulmonary embolus  Labs: Recent Labs    09/08/20 1213 09/08/20 1413 09/09/20 0330 09/09/20 1200 09/09/20 1908 09/09/20 2137 09/09/20 2217 09/09/20 2221 09/10/20 0049 09/10/20 0057 09/10/20 0500  HGB 7.3*  --  7.5*   < > 10.0* 8.8*  --   --   --  9.2*  --   HCT 25.2*  --  24.4*   < > 31.6* 27.0*  --   --   --  28.4*  --   PLT 184  --  133*  --  124* 121*  --   --   --  124*  --   APTT  --   --   --   --   --   --  >200*  --  199*  --  47*  HEPARINUNFRC  --   --   --   --   --   --   --  >1.10*  --   --  <0.10*  CREATININE 0.76  --  0.36*  --   --   --   --   --   --   --   --   TROPONINIHS 260* 448*  --   --   --   --   --   --   --   --   --    < > = values in this interval not displayed.    Assessment: 55yo female had been started on UFH for PE >> tx'd to [facility] for IR thrombectomy, rec'd heparin boluses in IR with subsequent increase in ACT, heparin gtt held by IR staff and MD asked pharmacy to resume heparin when PTT lower given high bleeding risk, initially >200 then 199 and now down to 47, RN reports some tea-colored urine, Hgb low but fairly stable >> will resume heparin.  Goal of Therapy:  Heparin level 0.3-0.7 units/ml   Plan:  Will resume heparin at 1000 units/hr and check level in 6 hours.    Wynona Neat, PharmD, BCPS  09/10/2020,5:57 AM

## 2020-09-10 NOTE — Progress Notes (Signed)
Pt transported to IR with myself and transporter. Transfer uneventful.

## 2020-09-10 NOTE — Progress Notes (Addendum)
HEMATOLOGY-ONCOLOGY PROGRESS NOTE  SUBJECTIVE: Underwent mechanical thrombectomy last evening by IR.  Large amount of thrombus removed.  Remains on heparin today.  Denies bleeding.  States overall that she feels much better.  She had a repeat CBC drawn just prior to my visit which showed a hemoglobin of 6.3.  This is being recollected to confirm.  Oncology History  Malignant neoplasm of stomach ([place])  08/26/2019 Initial Diagnosis   Malignant neoplasm of stomach ([place])   09/04/2019 -  Chemotherapy    Patient is on Treatment Plan: GASTROESOPHAGEAL RAMUCIRUMAB D1, 15  / PACLITAXEL D1,8,15 Q28D      09/19/2019 Genetic Testing   Negative genetic testing on the common hereditary cancer panel.  The Common Hereditary Gene Panel offered by [facility] includes sequencing and/or deletion duplication testing of the following 48 genes: APC, ATM, AXIN2, BARD1, BMPR1A, BRCA1, BRCA2, BRIP1, CDH1, CDK4, CDKN2A (p14ARF), CDKN2A (p16INK4a), CHEK2, CTNNA1, DICER1, EPCAM (Deletion/duplication testing only), GREM1 (promoter region deletion/duplication testing only), KIT, MEN1, MLH1, MSH2, MSH3, MSH6, MUTYH, NBN, NF1, NHTL1, PALB2, PDGFRA, PMS2, POLD1, POLE, PTEN, RAD50, RAD51C, RAD51D, RNF43, SDHB, SDHC, SDHD, SMAD4, SMARCA4. STK11, TP53, TSC1, TSC2, and VHL.  The following genes were evaluated for sequence changes only: SDHA and HOXB13 c.251G>A variant only. The report date is September 19, 2019.     PHYSICAL EXAMINATION:  Vitals:   09/10/20 1100 09/10/20 1200  BP:    Pulse: (!) 108 (!) 107  Resp: 18 19  Temp: 98.1 F (36.7 C)   SpO2:  100%   Filed Weights   09/08/20 1526  Weight: 74.6 kg    Intake/Output from previous day: 05/26 0701 - 05/27 0700 In: 2621.7 [I.V.:1945.7; Blood:582.5; IV Piggyback:93.5] Out: 850 [Urine:800; Blood:50]  GENERAL:alert, no distress and comfortable LUNGS: clear to auscultation and percussion with normal breathing effort HEART: Tachycardic, no lower extremity edema ABDOMEN:abdomen  soft, non-tender and normal bowel sounds NEURO: alert & oriented x 3 with fluent speech, no focal motor/sensory deficits  Port-A-Cath without erythema  LABORATORY DATA:  I have reviewed the data as listed CMP Latest Ref Rng & Units 09/09/2020 09/08/2020 09/01/2020  Glucose 70 - 99 mg/dL 96 185(H) 116(H)  BUN 6 - 20 mg/dL _0 Creatinine 0.44 - 1.00 mg/dL 0.36(L) 0.76 0.43(L)  Sodium 135 - 145 mmol/L 136 135 136  Potassium 3.5 - 5.1 mmol/L 3.9 3.9 4.0  Chloride 98 - 111 mmol/L 107 103 104  CO2 22 - 32 mmol/L 20(L) 22 24  Calcium 8.9 - 10.3 mg/dL 7.6(L) 8.4(L) 8.5(L)  Total Protein 6.5 - 8.1 g/dL 5.5(L) 6.7 6.5  Total Bilirubin 0.3 - 1.2 mg/dL 0.9 0.2(L) 0.4  Alkaline Phos 38 - 126 U/L 79 101 85  AST 15 - 41 U/L _1 ALT 0 - 44 U/L _2 Lab Results  Component Value Date   WBC 6.6 09/10/2020   HGB 6.3 (LL) 09/10/2020   HCT 19.6 (L) 09/10/2020   MCV 92.9 09/10/2020   PLT 96 (L) 09/10/2020   NEUTROABS 6.4 09/09/2020    CT ANGIO CHEST PE W OR WO CONTRAST  Result Date: 09/09/2020 CLINICAL DATA:  55 year old female with history of abnormal V/Q scan. History of breast cancer. EXAM: CT ANGIOGRAPHY CHEST WITH CONTRAST TECHNIQUE: Multidetector CT imaging of the chest was performed using the standard protocol during bolus administration of intravenous contrast. Multiplanar CT image reconstructions and MIPs were obtained to evaluate the vascular anatomy. CONTRAST:  149m OMNIPAQUE IOHEXOL 350 MG/ML SOLN  COMPARISON:  Chest CTA 08/16/2020. FINDINGS: Cardiovascular: Numerous filling defects throughout the pulmonary arterial tree compatible with extensive pulmonary embolus. Specifically, there are 2 large saddle emboli with extension into lobar, segmental and subsegmental sized branches in the lungs bilaterally. The majority of these emboli appear nonocclusive. Pulmonic trunk is currently nondilated measuring 2.3 cm in diameter. Right ventricle appears dilated with a diameter of  approximately 52 mm. Left ventricular diameter is approximately 37 mm. RV to LV ratio of 1.41. There is no significant pericardial fluid, thickening or pericardial calcification. No atherosclerotic calcifications in the thoracic aorta or the coronary arteries. Left-sided subclavian single-lumen porta cath with tip terminating in the right atrium. Mediastinum/Nodes: No pathologically enlarged mediastinal or hilar lymph nodes. Esophagus is unremarkable in appearance. No axillary lymphadenopathy. Lungs/Pleura: There are some patchy areas of ground-glass attenuation and mild septal thickening in the lower lobes of the lungs bilaterally, nonspecific, potentially predominantly atelectatic, although areas of mild alveolar hemorrhage are difficult to exclude. No confluent consolidative airspace disease. Trace left pleural effusion. No definite suspicious appearing pulmonary nodules or masses are noted. Upper Abdomen: Small amount of perinephric stranding adjacent to the left kidney, nonspecific. Musculoskeletal: There are no aggressive appearing lytic or blastic lesions noted in the visualized portions of the skeleton. Review of the MIP images confirms the above findings. IMPRESSION: 1. Positive for acute PE with CT evidence of right heart strain (RV/LV Ratio = 1.41) consistent with at least submassive (intermediate risk) PE. The presence of right heart strain has been associated with an increased risk of morbidity and mortality. Please refer to the "PE Focused" order set in EPIC. 2. Small amount of ground-glass attenuation and septal thickening in the lower lobes of the lungs bilaterally. The possibility of areas of mild alveolar hemorrhage is not excluded, although most of this is likely atelectatic. 3. Trace left pleural effusion. Critical Value/emergent results were discussed in person at the time of interpretation on 09/09/2020 at 3:44 pm to provider Dr. Ronny Bacon, who verbally acknowledged these results. Electronically  Signed   By: Vinnie Langton M.D.   On: 09/09/2020 16:00   CT ANGIO CHEST PE W OR WO CONTRAST  Result Date: 08/16/2020 CLINICAL DATA:  Shortness of breath and chest pain. History of breast, gastric, and colon cancer. EXAM: CT ANGIOGRAPHY CHEST WITH CONTRAST TECHNIQUE: Multidetector CT imaging of the chest was performed using the standard protocol during bolus administration of intravenous contrast. Multiplanar CT image reconstructions and MIPs were obtained to evaluate the vascular anatomy. CONTRAST:  152m OMNIPAQUE IOHEXOL 350 MG/ML SOLN COMPARISON:  Chest CT May 11, 2020 FINDINGS: Cardiovascular: There is no demonstrable pulmonary embolus. A vessel in the left lower lobe makes an acute turn causing a subtle defect on the axial images which is not confirmed on other images and is not felt to represent pulmonary embolus. There is no thoracic aortic aneurysm or dissection. Visualized great vessels appear unremarkable. Port-A-Cath tip is in the superior vena cava. No evident pericardial effusion or pericardial thickening. Mediastinum/Nodes: Status post left thyroidectomy. Remaining thyroid appears normal. No appreciable adenopathy. There remains circumferential thickening in the distal esophagus which is unchanged from prior study. This area may represent previous treated neoplasm but also may represent esophagitis. The appearance in this area is stable. Lungs/Pleura: There is slight bibasilar atelectasis. No evident edema or airspace opacity. No appreciable pleural effusions. Trachea and major bronchial structures appear patent. No evident pneumothorax. Upper Abdomen: Gallbladder is absent. Occasional presumed calcified granulomas in the spleen. Incomplete visualization of cyst  arising from posterior right kidney measuring 0.8 x 0.8 cm. Musculoskeletal: No blastic or lytic bone lesions. Port present on the left anteriorly. No chest wall lesions appreciable. Review of the MIP images confirms the above  findings. IMPRESSION: 1. No demonstrable pulmonary embolus. No thoracic aortic aneurysm or dissection. 2. Circumferential thickening distal esophagus again noted which may represent residua of previous treated neoplasm but also could represent a degree of esophagitis. 3.  No edema or airspace opacity.  Mild bibasilar atelectasis. 4.  No evident adenopathy. 5. Status post left-sided thyroidectomy. Status post cholecystectomy. 6.  Port-A-Cath tip in superior vena cava near cavoatrial junction. Electronically Signed   By: Lowella Grip III M.D.   On: 08/16/2020 13:31   NM Pulmonary Perfusion  Result Date: 09/09/2020 CLINICAL DATA:  Deep venous thrombosis, shortness of breath and chest pain for 3 days, elevated D-dimer, high clinical suspicion of pulmonary embolism EXAM: NUCLEAR MEDICINE PERFUSION LUNG SCAN TECHNIQUE: Perfusion images were obtained in multiple projections after intravenous injection of radiopharmaceutical. Ventilation scans intentionally deferred if perfusion scan and chest x-ray adequate for interpretation during COVID 19 epidemic. RADIOPHARMACEUTICALS:  4.25 mCi Tc-49mMAA IV COMPARISON:  Chest radiograph 09/08/2020 FINDINGS: Subsegmental perfusion defects identified within RIGHT middle lobe and lingula. Additional subsegmental perfusion defect LEFT lower lobe. Large multi segmental perfusion defect in RIGHT upper lobe. Findings are consistent with pulmonary embolism. Chest radiograph shows mild atelectasis or infiltrate in the RIGHT lower lobe, remaining lungs clear. IMPRESSION: Multiple BILATERAL pulmonary perfusion defects consistent with pulmonary embolism. Critical Value/emergent results were called by telephone at the time of interpretation on 09/09/2020 at 1200 hrs to provider Dr. CAileen Fass who verbally acknowledged these results. Electronically Signed   By: MLavonia DanaM.D.   On: 09/09/2020 12:00   IR Angiogram Pulmonary Bilateral Selective  Result Date: 09/10/2020 INDICATION:  55year old woman with massive pulmonary artery embolism presented with syncope and saddle PE. Interventional radiology consulted for pulmonary thrombectomy/thrombolysis. Patient unable to receive systemic tPA due to bleeding gastric mass. EXAM: 1. Ultrasound-guided access of right internal jugular vein 2. Central venous catheter placement 3. Ultrasound-guided access of right common femoral vein 4. Bilateral pulmonary angiogram and mechanical thrombectomy COMPARISON:  CT angiography chest 06/12/2020 MEDICATIONS: None ANESTHESIA/SEDATION: Sedation performed and monitored by the anesthesia team FLUOROSCOPY TIME:  Fluoroscopy Time: 20 minutes 48 seconds (83 mGy). COMPLICATIONS: None immediate. TECHNIQUE: Informed written consent was obtained from the patient after a thorough discussion of the procedural risks, benefits and alternatives. All questions were addressed. Maximal Sterile Barrier Technique was utilized including caps, mask, sterile gowns, sterile gloves, sterile drape, hand hygiene and skin antiseptic. A timeout was performed prior to the initiation of the procedure. Patient positioned supine on the procedure table. Right neck and groin skin prepped and draped in usual fashion. The right internal jugular vein was evaluated with ultrasound and shown to be patent. A permanent ultrasound image was obtained and placed in the patient's medical record. Using sterile gel and a sterile probe cover, the right internal jugular vein was entered with a 21 ga needle during real time ultrasound guidance. 0.018 inch guidewire placed and 21 ga needle exchanged for transitional dilator set. Utilizing fluoroscopy, 0.035 inch guidewire advanced through the needle without difficulty. Serial dilation performed, and catheter inserted over the guidewire. The tip was positioned in the right atrium. All lumens of the catheter aspirated and flushed well. The catheter was secured to the skin with suture. The insertion site was covered  with a Biopatch and sterile  dressing. The right common femoral vein was evaluated with ultrasound and shown to be patent. A permanent ultrasound image was obtained and placed in the patient's medical record. Using sterile gel and a sterile probe cover the right common femoral vein was entered with a 21 gauge needle during real time ultrasound guidance. 21 gauge needle exchanged for transitional dilator set over 0.018 in guidewire. Transitional dilator set exchanged for 6 French sheath over 0.035 in guidewire. 6 Fr MIV double angle pigtail catheter advanced to the main pulmonary artery utilizing fluoroscopic guidance. Pre thrombectomy pulmonary artery pressure: 39/11 mmHg (mean 26 mmHg) Double angled pigtail catheter removed over exchange length Amplatz guidewire. Vert catheter utilized to advance Amplatz guidewire into right lower lobe pulmonary artery branch. Serial dilation was performed and 6 [place] sheath exchanged for [employer] 24 French sheath. 35 [place] FlowTriver device was advanced over the wire into the lateral basilar segmental and Truncus Anterior branches of the right pulmonary arterial system. Mechanical aspiration thrombectomy was performed extirpating thrombus. Post thrombectomy pulmonary angiogram demonstrated significant reduction in overall clot burden in the right pulmonary arterial branches. The left lateral basilar segmental branch of the lower lobe artery was selected with the [facility] catheter. The 20 [place] FlowTriver device was advanced through the 24 French device over the wire into the lateral basilar segmental branch of the left pulmonary arterial system. Mechanical aspiration thrombectomy was performed extirpating thrombus. Post aspiration pulmonary angiogram demonstrated significant reduction in overall clot burden. Post thrombectomy Pulmonary artery pressure: 27/7 mmHg (mean 15 mmHg) FlowTriever device was removed. The [place] Dry Seal sheath was removed and hemostasis  achieved with purse-string suture and manual compression. IMPRESSION: 1. Bilateral pulmonary angiogram and mechanical thrombectomy. Mean pulmonary artery pressure decreased from 26 mm Hg to 15 mm Hg following thrombectomy. 2. Right IJ non tunneled central line placement. Electronically Signed   By: Miachel Roux M.D.   On: 09/10/2020 12:35   IR Fluoro Guide CV Line Right  Result Date: 09/10/2020 INDICATION: 55 year old woman with massive pulmonary artery embolism presented with syncope and saddle PE. Interventional radiology consulted for pulmonary thrombectomy/thrombolysis. Patient unable to receive systemic tPA due to bleeding gastric mass. EXAM: 1. Ultrasound-guided access of right internal jugular vein 2. Central venous catheter placement 3. Ultrasound-guided access of right common femoral vein 4. Bilateral pulmonary angiogram and mechanical thrombectomy COMPARISON:  CT angiography chest 06/12/2020 MEDICATIONS: None ANESTHESIA/SEDATION: Sedation performed and monitored by the anesthesia team FLUOROSCOPY TIME:  Fluoroscopy Time: 20 minutes 48 seconds (83 mGy). COMPLICATIONS: None immediate. TECHNIQUE: Informed written consent was obtained from the patient after a thorough discussion of the procedural risks, benefits and alternatives. All questions were addressed. Maximal Sterile Barrier Technique was utilized including caps, mask, sterile gowns, sterile gloves, sterile drape, hand hygiene and skin antiseptic. A timeout was performed prior to the initiation of the procedure. Patient positioned supine on the procedure table. Right neck and groin skin prepped and draped in usual fashion. The right internal jugular vein was evaluated with ultrasound and shown to be patent. A permanent ultrasound image was obtained and placed in the patient's medical record. Using sterile gel and a sterile probe cover, the right internal jugular vein was entered with a 21 ga needle during real time ultrasound guidance. 0.018 inch  guidewire placed and 21 ga needle exchanged for transitional dilator set. Utilizing fluoroscopy, 0.035 inch guidewire advanced through the needle without difficulty. Serial dilation performed, and catheter inserted over the guidewire. The tip was positioned in the right atrium.  All lumens of the catheter aspirated and flushed well. The catheter was secured to the skin with suture. The insertion site was covered with a Biopatch and sterile dressing. The right common femoral vein was evaluated with ultrasound and shown to be patent. A permanent ultrasound image was obtained and placed in the patient's medical record. Using sterile gel and a sterile probe cover the right common femoral vein was entered with a 21 gauge needle during real time ultrasound guidance. 21 gauge needle exchanged for transitional dilator set over 0.018 in guidewire. Transitional dilator set exchanged for 6 French sheath over 0.035 in guidewire. 6 Fr MIV double angle pigtail catheter advanced to the main pulmonary artery utilizing fluoroscopic guidance. Pre thrombectomy pulmonary artery pressure: 39/11 mmHg (mean 26 mmHg) Double angled pigtail catheter removed over exchange length Amplatz guidewire. Vert catheter utilized to advance Amplatz guidewire into right lower lobe pulmonary artery branch. Serial dilation was performed and 6 [place] sheath exchanged for [employer] 24 French sheath. 71 [place] FlowTriver device was advanced over the wire into the lateral basilar segmental and Truncus Anterior branches of the right pulmonary arterial system. Mechanical aspiration thrombectomy was performed extirpating thrombus. Post thrombectomy pulmonary angiogram demonstrated significant reduction in overall clot burden in the right pulmonary arterial branches. The left lateral basilar segmental branch of the lower lobe artery was selected with the [facility] catheter. The 20 [place] FlowTriver device was advanced through the 24 French device over  the wire into the lateral basilar segmental branch of the left pulmonary arterial system. Mechanical aspiration thrombectomy was performed extirpating thrombus. Post aspiration pulmonary angiogram demonstrated significant reduction in overall clot burden. Post thrombectomy Pulmonary artery pressure: 27/7 mmHg (mean 15 mmHg) FlowTriever device was removed. The [place] Dry Seal sheath was removed and hemostasis achieved with purse-string suture and manual compression. IMPRESSION: 1. Bilateral pulmonary angiogram and mechanical thrombectomy. Mean pulmonary artery pressure decreased from 26 mm Hg to 15 mm Hg following thrombectomy. 2. Right IJ non tunneled central line placement. Electronically Signed   By: Miachel Roux M.D.   On: 09/10/2020 12:35   IR THROMBECT PRIM MECH INIT (INCLU) MOD SED  Result Date: 09/10/2020 INDICATION: 55 year old woman with massive pulmonary artery embolism presented with syncope and saddle PE. Interventional radiology consulted for pulmonary thrombectomy/thrombolysis. Patient unable to receive systemic tPA due to bleeding gastric mass. EXAM: 1. Ultrasound-guided access of right internal jugular vein 2. Central venous catheter placement 3. Ultrasound-guided access of right common femoral vein 4. Bilateral pulmonary angiogram and mechanical thrombectomy COMPARISON:  CT angiography chest 06/12/2020 MEDICATIONS: None ANESTHESIA/SEDATION: Sedation performed and monitored by the anesthesia team FLUOROSCOPY TIME:  Fluoroscopy Time: 20 minutes 48 seconds (83 mGy). COMPLICATIONS: None immediate. TECHNIQUE: Informed written consent was obtained from the patient after a thorough discussion of the procedural risks, benefits and alternatives. All questions were addressed. Maximal Sterile Barrier Technique was utilized including caps, mask, sterile gowns, sterile gloves, sterile drape, hand hygiene and skin antiseptic. A timeout was performed prior to the initiation of the procedure. Patient positioned  supine on the procedure table. Right neck and groin skin prepped and draped in usual fashion. The right internal jugular vein was evaluated with ultrasound and shown to be patent. A permanent ultrasound image was obtained and placed in the patient's medical record. Using sterile gel and a sterile probe cover, the right internal jugular vein was entered with a 21 ga needle during real time ultrasound guidance. 0.018 inch guidewire placed and 21 ga needle exchanged for transitional  dilator set. Utilizing fluoroscopy, 0.035 inch guidewire advanced through the needle without difficulty. Serial dilation performed, and catheter inserted over the guidewire. The tip was positioned in the right atrium. All lumens of the catheter aspirated and flushed well. The catheter was secured to the skin with suture. The insertion site was covered with a Biopatch and sterile dressing. The right common femoral vein was evaluated with ultrasound and shown to be patent. A permanent ultrasound image was obtained and placed in the patient's medical record. Using sterile gel and a sterile probe cover the right common femoral vein was entered with a 21 gauge needle during real time ultrasound guidance. 21 gauge needle exchanged for transitional dilator set over 0.018 in guidewire. Transitional dilator set exchanged for 6 French sheath over 0.035 in guidewire. 6 Fr MIV double angle pigtail catheter advanced to the main pulmonary artery utilizing fluoroscopic guidance. Pre thrombectomy pulmonary artery pressure: 39/11 mmHg (mean 26 mmHg) Double angled pigtail catheter removed over exchange length Amplatz guidewire. Vert catheter utilized to advance Amplatz guidewire into right lower lobe pulmonary artery branch. Serial dilation was performed and 6 [place] sheath exchanged for [employer] 24 French sheath. 8 [place] FlowTriver device was advanced over the wire into the lateral basilar segmental and Truncus Anterior branches of the right  pulmonary arterial system. Mechanical aspiration thrombectomy was performed extirpating thrombus. Post thrombectomy pulmonary angiogram demonstrated significant reduction in overall clot burden in the right pulmonary arterial branches. The left lateral basilar segmental branch of the lower lobe artery was selected with the [facility] catheter. The 20 [place] FlowTriver device was advanced through the 24 French device over the wire into the lateral basilar segmental branch of the left pulmonary arterial system. Mechanical aspiration thrombectomy was performed extirpating thrombus. Post aspiration pulmonary angiogram demonstrated significant reduction in overall clot burden. Post thrombectomy Pulmonary artery pressure: 27/7 mmHg (mean 15 mmHg) FlowTriever device was removed. The [place] Dry Seal sheath was removed and hemostasis achieved with purse-string suture and manual compression. IMPRESSION: 1. Bilateral pulmonary angiogram and mechanical thrombectomy. Mean pulmonary artery pressure decreased from 26 mm Hg to 15 mm Hg following thrombectomy. 2. Right IJ non tunneled central line placement. Electronically Signed   By: Miachel Roux M.D.   On: 09/10/2020 12:35   IR US Guide Vasc Access Right  Result Date: 09/10/2020 INDICATION: 55 year old woman with massive pulmonary artery embolism presented with syncope and saddle PE. Interventional radiology consulted for pulmonary thrombectomy/thrombolysis. Patient unable to receive systemic tPA due to bleeding gastric mass. EXAM: 1. Ultrasound-guided access of right internal jugular vein 2. Central venous catheter placement 3. Ultrasound-guided access of right common femoral vein 4. Bilateral pulmonary angiogram and mechanical thrombectomy COMPARISON:  CT angiography chest 06/12/2020 MEDICATIONS: None ANESTHESIA/SEDATION: Sedation performed and monitored by the anesthesia team FLUOROSCOPY TIME:  Fluoroscopy Time: 20 minutes 48 seconds (83 mGy). COMPLICATIONS: None immediate.  TECHNIQUE: Informed written consent was obtained from the patient after a thorough discussion of the procedural risks, benefits and alternatives. All questions were addressed. Maximal Sterile Barrier Technique was utilized including caps, mask, sterile gowns, sterile gloves, sterile drape, hand hygiene and skin antiseptic. A timeout was performed prior to the initiation of the procedure. Patient positioned supine on the procedure table. Right neck and groin skin prepped and draped in usual fashion. The right internal jugular vein was evaluated with ultrasound and shown to be patent. A permanent ultrasound image was obtained and placed in the patient's medical record. Using sterile gel and a sterile probe cover,  the right internal jugular vein was entered with a 21 ga needle during real time ultrasound guidance. 0.018 inch guidewire placed and 21 ga needle exchanged for transitional dilator set. Utilizing fluoroscopy, 0.035 inch guidewire advanced through the needle without difficulty. Serial dilation performed, and catheter inserted over the guidewire. The tip was positioned in the right atrium. All lumens of the catheter aspirated and flushed well. The catheter was secured to the skin with suture. The insertion site was covered with a Biopatch and sterile dressing. The right common femoral vein was evaluated with ultrasound and shown to be patent. A permanent ultrasound image was obtained and placed in the patient's medical record. Using sterile gel and a sterile probe cover the right common femoral vein was entered with a 21 gauge needle during real time ultrasound guidance. 21 gauge needle exchanged for transitional dilator set over 0.018 in guidewire. Transitional dilator set exchanged for 6 French sheath over 0.035 in guidewire. 6 Fr MIV double angle pigtail catheter advanced to the main pulmonary artery utilizing fluoroscopic guidance. Pre thrombectomy pulmonary artery pressure: 39/11 mmHg (mean 26 mmHg)  Double angled pigtail catheter removed over exchange length Amplatz guidewire. Vert catheter utilized to advance Amplatz guidewire into right lower lobe pulmonary artery branch. Serial dilation was performed and 6 [place] sheath exchanged for [employer] 24 French sheath. 84 [place] FlowTriver device was advanced over the wire into the lateral basilar segmental and Truncus Anterior branches of the right pulmonary arterial system. Mechanical aspiration thrombectomy was performed extirpating thrombus. Post thrombectomy pulmonary angiogram demonstrated significant reduction in overall clot burden in the right pulmonary arterial branches. The left lateral basilar segmental branch of the lower lobe artery was selected with the [facility] catheter. The 20 [place] FlowTriver device was advanced through the 24 French device over the wire into the lateral basilar segmental branch of the left pulmonary arterial system. Mechanical aspiration thrombectomy was performed extirpating thrombus. Post aspiration pulmonary angiogram demonstrated significant reduction in overall clot burden. Post thrombectomy Pulmonary artery pressure: 27/7 mmHg (mean 15 mmHg) FlowTriever device was removed. The [place] Dry Seal sheath was removed and hemostasis achieved with purse-string suture and manual compression. IMPRESSION: 1. Bilateral pulmonary angiogram and mechanical thrombectomy. Mean pulmonary artery pressure decreased from 26 mm Hg to 15 mm Hg following thrombectomy. 2. Right IJ non tunneled central line placement. Electronically Signed   By: Miachel Roux M.D.   On: 09/10/2020 12:35   IR US Guide Vasc Access Right  Result Date: 09/10/2020 INDICATION: 55 year old woman with massive pulmonary artery embolism presented with syncope and saddle PE. Interventional radiology consulted for pulmonary thrombectomy/thrombolysis. Patient unable to receive systemic tPA due to bleeding gastric mass. EXAM: 1. Ultrasound-guided access of right  internal jugular vein 2. Central venous catheter placement 3. Ultrasound-guided access of right common femoral vein 4. Bilateral pulmonary angiogram and mechanical thrombectomy COMPARISON:  CT angiography chest 06/12/2020 MEDICATIONS: None ANESTHESIA/SEDATION: Sedation performed and monitored by the anesthesia team FLUOROSCOPY TIME:  Fluoroscopy Time: 20 minutes 48 seconds (83 mGy). COMPLICATIONS: None immediate. TECHNIQUE: Informed written consent was obtained from the patient after a thorough discussion of the procedural risks, benefits and alternatives. All questions were addressed. Maximal Sterile Barrier Technique was utilized including caps, mask, sterile gowns, sterile gloves, sterile drape, hand hygiene and skin antiseptic. A timeout was performed prior to the initiation of the procedure. Patient positioned supine on the procedure table. Right neck and groin skin prepped and draped in usual fashion. The right internal jugular vein was evaluated  with ultrasound and shown to be patent. A permanent ultrasound image was obtained and placed in the patient's medical record. Using sterile gel and a sterile probe cover, the right internal jugular vein was entered with a 21 ga needle during real time ultrasound guidance. 0.018 inch guidewire placed and 21 ga needle exchanged for transitional dilator set. Utilizing fluoroscopy, 0.035 inch guidewire advanced through the needle without difficulty. Serial dilation performed, and catheter inserted over the guidewire. The tip was positioned in the right atrium. All lumens of the catheter aspirated and flushed well. The catheter was secured to the skin with suture. The insertion site was covered with a Biopatch and sterile dressing. The right common femoral vein was evaluated with ultrasound and shown to be patent. A permanent ultrasound image was obtained and placed in the patient's medical record. Using sterile gel and a sterile probe cover the right common femoral vein  was entered with a 21 gauge needle during real time ultrasound guidance. 21 gauge needle exchanged for transitional dilator set over 0.018 in guidewire. Transitional dilator set exchanged for 6 French sheath over 0.035 in guidewire. 6 Fr MIV double angle pigtail catheter advanced to the main pulmonary artery utilizing fluoroscopic guidance. Pre thrombectomy pulmonary artery pressure: 39/11 mmHg (mean 26 mmHg) Double angled pigtail catheter removed over exchange length Amplatz guidewire. Vert catheter utilized to advance Amplatz guidewire into right lower lobe pulmonary artery branch. Serial dilation was performed and 6 [place] sheath exchanged for [employer] 24 French sheath. 80 [place] FlowTriver device was advanced over the wire into the lateral basilar segmental and Truncus Anterior branches of the right pulmonary arterial system. Mechanical aspiration thrombectomy was performed extirpating thrombus. Post thrombectomy pulmonary angiogram demonstrated significant reduction in overall clot burden in the right pulmonary arterial branches. The left lateral basilar segmental branch of the lower lobe artery was selected with the [facility] catheter. The 20 [place] FlowTriver device was advanced through the 24 French device over the wire into the lateral basilar segmental branch of the left pulmonary arterial system. Mechanical aspiration thrombectomy was performed extirpating thrombus. Post aspiration pulmonary angiogram demonstrated significant reduction in overall clot burden. Post thrombectomy Pulmonary artery pressure: 27/7 mmHg (mean 15 mmHg) FlowTriever device was removed. The [place] Dry Seal sheath was removed and hemostasis achieved with purse-string suture and manual compression. IMPRESSION: 1. Bilateral pulmonary angiogram and mechanical thrombectomy. Mean pulmonary artery pressure decreased from 26 mm Hg to 15 mm Hg following thrombectomy. 2. Right IJ non tunneled central line placement. Electronically  Signed   By: Miachel Roux M.D.   On: 09/10/2020 12:35   DG Chest Port 1 View  Result Date: 09/08/2020 CLINICAL DATA:  Shortness of breath.  Syncopal episodes. EXAM: PORTABLE CHEST 1 VIEW COMPARISON:  09/07/2019 FINDINGS: Stable appearance of the left subclavian Port-A-Cath with the tip extending into the upper right atrium. There is a chronic kink in the port near the inferior aspect of the left clavicle. Lungs are clear. Heart size is normal. Trachea is midline. No acute bone abnormality. IMPRESSION: 1. No acute cardiopulmonary disease. 2. Stable appearance of the left subclavian port with a kink in the catheter near the left clavicle. 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 Electronically Signed   By: Markus Daft M.D.   On: 09/08/2020 13:00   ECHOCARDIOGRAM COMPLETE  Result Date: 09/09/2020    ECHOCARDIOGRAM REPORT   Patient Name:   EMMILY PELLEGRIN Date of Exam: 09/09/2020 Medical  Rec #:  [id number]         Height:       64.0 in Accession #:    [id number]        Weight:       164.5 lb Date of Birth:  01-07-1966         BSA:          1.800 m Patient Age:    55 years          BP:           91/62 mmHg Patient Gender: F                 HR:           114 bpm. Exam Location:  Inpatient Procedure: 2D Echo, Cardiac Doppler and Color Doppler Indications:    Elevated troponin  History:        Patient has no prior history of Echocardiogram examinations.                 Signs/Symptoms:Syncope and Elevated Troponins. Gastric cancer,                 breast cancer, anemia, Chemo.  Sonographer:    Dustin Flock Referring Phys: [id number] [place]  1. There is sinus tachycardia with severe RV dilation and moderately reduced RV function. 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  2. Left ventricular ejection fraction, by estimation, is 60 to 65%. The left ventricle has normal function. The left ventricle has no regional wall motion abnormalities.  Indeterminate diastolic filling due to E-A fusion. There is the interventricular septum is flattened in systole and diastole, consistent with right ventricular pressure and volume overload.  3. Right ventricular systolic function is moderately reduced. The right ventricular size is severely enlarged. There is mildly elevated pulmonary artery systolic pressure. The estimated right ventricular systolic pressure is 00.9 mmHg.  4. The mitral valve is grossly normal. No evidence of mitral valve regurgitation. No evidence of mitral stenosis.  5. The aortic valve is tricuspid. Aortic valve regurgitation is not visualized. No aortic stenosis is present.  6. The inferior vena cava is normal in size with greater than 50% respiratory variability, suggesting right atrial pressure of 3 mmHg. FINDINGS  Left Ventricle: Left ventricular ejection fraction, by estimation, is 60 to 65%. The left ventricle has normal function. The left ventricle has no regional wall motion abnormalities. The left ventricular internal cavity size was normal in size. There is  no left ventricular hypertrophy. The interventricular septum is flattened in systole and diastole, consistent with right ventricular pressure and volume overload. Indeterminate diastolic filling due to E-A fusion. Right Ventricle: The right ventricular size is severely enlarged. No increase in right ventricular wall thickness. Right ventricular systolic function is moderately reduced. There is mildly elevated pulmonary artery systolic pressure. The tricuspid regurgitant velocity is 2.93 m/s, and with an assumed right atrial pressure of 3 mmHg, the estimated right ventricular systolic pressure is 38.1 mmHg. Left Atrium: Left atrial size was normal in size. Right Atrium: Right atrial size was normal in size. Pericardium: Trivial pericardial effusion is present. Mitral Valve: The mitral valve is grossly normal. No evidence of mitral valve regurgitation. No evidence of mitral valve  stenosis. Tricuspid Valve: The tricuspid valve is grossly normal. Tricuspid valve regurgitation is mild . No evidence of tricuspid stenosis. Aortic Valve: The aortic valve is tricuspid. Aortic valve regurgitation is not visualized. No aortic stenosis is present. Pulmonic  Valve: The pulmonic valve was grossly normal. Pulmonic valve regurgitation is not visualized. No evidence of pulmonic stenosis. Aorta: The aortic root and ascending aorta are structurally normal, with no evidence of dilitation. Venous: The inferior vena cava is normal in size with greater than 50% respiratory variability, suggesting right atrial pressure of 3 mmHg. IAS/Shunts: The atrial septum is grossly normal. Additional Comments: A venous catheter is visualized in the right atrium.  LEFT VENTRICLE PLAX 2D LVIDd:         3.20 cm     Diastology LVIDs:         2.40 cm     LV e' medial:    8.05 cm/s LV PW:         1.10 cm     LV E/e' medial:  6.2 LV IVS:        1.20 cm     LV e' lateral:   7.62 cm/s LVOT diam:     1.80 cm     LV E/e' lateral: 6.5 LV SV:         23 LV SV Index:   13 LVOT Area:     2.54 cm  LV Volumes (MOD) LV vol d, MOD A4C: 47.5 ml LV vol s, MOD A4C: 20.7 ml LV SV MOD A4C:     47.5 ml RIGHT VENTRICLE RV Basal diam:  4.10 cm RV S prime:     8.27 cm/s TAPSE (M-mode): 1.6 cm LEFT ATRIUM             Index       RIGHT ATRIUM          Index LA diam:        2.30 cm 1.28 cm/m  RA Area:     9.61 cm LA Vol (A2C):   14.9 ml 8.28 ml/m  RA Volume:   21.80 ml 12.11 ml/m LA Vol (A4C):   25.8 ml 14.33 ml/m LA Biplane Vol: 21.4 ml 11.89 ml/m  AORTIC VALVE LVOT Vmax:   81.70 cm/s LVOT Vmean:  51.100 cm/s LVOT VTI:    0.092 m  AORTA Ao Root diam: 2.60 cm MITRAL VALVE               TRICUSPID VALVE MV Area (PHT): 6.22 cm    TR Peak grad:   34.3 mmHg MV Decel Time: 122 msec    TR Vmax:        293.00 cm/s MV E velocity: 49.80 cm/s MV A velocity: 78.20 cm/s  SHUNTS MV E/A ratio:  0.64        Systemic VTI:  0.09 m                            Systemic  Diam: 1.80 cm Eleonore Chiquito MD Electronically signed by Eleonore Chiquito MD Signature Date/Time: 09/09/2020/12:04:31 PM    Final    VAS [place] LOWER EXTREMITY VENOUS (DVT)  Result Date: 09/09/2020  Lower Venous DVT Study Patient Name:  LATAVIA GOGA  Date of Exam:   09/09/2020 Medical Rec #: [id number]          Accession #:    [id number] Date of Birth: 1965-07-02          Patient Gender: F Patient Age:   89Y Exam Location:  [facility] Procedure:      VAS [place] LOWER EXTREMITY VENOUS (DVT) Referring Phys: [id number] Silvestre Moment [facility] --------------------------------------------------------------------------------  Indications: Pulmonary embolism.  Comparison Study: no  prior Performing Technologist: Abram Sander RVS  Examination Guidelines: A complete evaluation includes B-mode imaging, spectral Doppler, color Doppler, and power Doppler as needed of all accessible portions of each vessel. Bilateral testing is considered an integral part of a complete examination. Limited examinations for reoccurring indications may be performed as noted. The reflux portion of the exam is performed with the patient in reverse Trendelenburg.  +---------+---------------+---------+-----------+----------+--------------+ RIGHT    CompressibilityPhasicitySpontaneityPropertiesThrombus Aging +---------+---------------+---------+-----------+----------+--------------+ CFV      Full           Yes      Yes                                 +---------+---------------+---------+-----------+----------+--------------+ SFJ      Full                                                        +---------+---------------+---------+-----------+----------+--------------+ FV Prox  Full                                                        +---------+---------------+---------+-----------+----------+--------------+ FV Mid   Full                                                         +---------+---------------+---------+-----------+----------+--------------+ FV DistalFull                                                        +---------+---------------+---------+-----------+----------+--------------+ PFV      Full                                                        +---------+---------------+---------+-----------+----------+--------------+ POP      Full           Yes      Yes                                 +---------+---------------+---------+-----------+----------+--------------+ PTV      Full                                                        +---------+---------------+---------+-----------+----------+--------------+ PERO     Full                                                        +---------+---------------+---------+-----------+----------+--------------+   +---------+---------------+---------+-----------+----------+-----------------+  LEFT     CompressibilityPhasicitySpontaneityPropertiesThrombus Aging    +---------+---------------+---------+-----------+----------+-----------------+ CFV      Full           Yes      Yes                                    +---------+---------------+---------+-----------+----------+-----------------+ SFJ      Full                                                           +---------+---------------+---------+-----------+----------+-----------------+ FV Prox  Full                                                           +---------+---------------+---------+-----------+----------+-----------------+ FV Mid   Full                                                           +---------+---------------+---------+-----------+----------+-----------------+ FV DistalFull                                                           +---------+---------------+---------+-----------+----------+-----------------+ PFV      Full                                                            +---------+---------------+---------+-----------+----------+-----------------+ POP      None           Yes      Yes                  Age Indeterminate +---------+---------------+---------+-----------+----------+-----------------+ PTV      None                                         Age Indeterminate +---------+---------------+---------+-----------+----------+-----------------+ PERO     None                                         Age Indeterminate +---------+---------------+---------+-----------+----------+-----------------+     Summary: RIGHT: - There is no evidence of deep vein thrombosis in the lower extremity.  - No cystic structure found in the popliteal fossa.  LEFT: - Findings consistent with age indeterminate deep vein thrombosis involving the left popliteal vein, left posterior tibial veins, and left peroneal veins. - No cystic structure found in the popliteal fossa.  *See table(s)  above for measurements and observations. Electronically signed by Servando Snare MD on 09/09/2020 at 4:03:54 PM.    Final     ASSESSMENT AND PLAN: 1. Gastric cancer ? Presenting with dysphagia spring 2019 ? 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 CTs revealed wall thickening and luminal narrowing of the colon at the hepatic flexure and cecum ? PET scan with no evidence of distant metastatic disease or abnormal uptake other than thickening of the distal esophagus and proximal stomach ? 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 CTs 10/23/2017-diffuse prominence of the gastric wall, especially the antrum, focal wall thickening of the distal ascending colon and hepatic flexure, thickening of the cecum, nonspecific haziness of the mesenteric fat in the pelvis, mild prominence of lymph nodes at the greater curvature of the stomach ? Treatment with FOLFOX beginning July 2019 ? CTs October 2019-stable disease, FOLFOX continued ? CTs December 2019-stable disease ? January 2020 treatment transition to Xeloda maintenance, care transition to Felt ? CTs in June 2020 in September 2020-stable disease, Xeloda continued ? CTs 06/23/2019-nonspecific thickening of the GE junction, intraluminal bladder mass ? Cystoscopy-metastatic signet ring cell adenocarcinoma ? Cycle 1 day 1 ramucirumab/paclitaxel 08/19/2019(given atanother facility) ? Cycle 1 day 1 ramucirumab/paclitaxel 09/04/2019 ? Cycle 1 day 8 Taxol 09/10/19 ? Cycle 1 day 15 ramucirumab/paclitaxel 6/2/2021canceled d/t neutropenia ? Ramucirumab/pactlitaxelevery 2 weeks 09/24/2019 ? Ramucirumab/paclitaxel 10/08/2019 ? Ramucirumab/paclitaxel 10/22/2019 ? Ramucirumab/paclitaxel 11/05/2019 ? CTs 11/24/2019-unchanged thickening of the distal esophagus/upper stomach, gastric body and antrum. Unchanged thickening of the transverse colon at the hepatic flexure, unchanged thickening of the urinary bladder, no evidence of progressive disease ? Ramucirumab/paclitaxel 11/25/2019 ? Ramucirumab/paclitaxel 12/09/2019 ? Ramucirumab/paclitaxel 12/23/2019 ? Ramucirumab/paclitaxel9/21/2021 ? Ramucirumab/paclitaxel 01/20/2020 ? Ramucirumab/paclitaxel 02/04/2020 ? Ramucirumab/paclitaxel 02/17/2020 ? Ramucirumab/paclitaxel 03/02/2020 ? Ramucirumab/paclitaxel 03/16/2020 ? CTs 03/28/2020-stable thickening of the  distal esophagus, GE junction, gastric body, and antrum. Decreased soft tissue thickening of the transverse colon at the hepatic flexure, no evidence of metastatic disease to the chest ? Ramucirumab/paclitaxel 03/30/2020 ? SBRT to the stomach 04/12/2020-05/06/2020, 37.5 Gray in 15 fractions ? Ramucirumab/paclitaxel 04/05/2020 ? Ramucirumab/paclitaxel 04/13/2020 ? Ramucirumab/paclitaxel 04/27/2020 ? Ramucirumab/paclitaxel 05/25/2020 ? Ramucirumab/paclitaxel 06/11/2020 ? Ramucirumab/paclitaxel 06/22/2020 ? Ramucirumab/paclitaxel 07/06/2020 ? Ramucirumab/paclitaxel 07/20/2020 ? CT abdomen/pelvis 08/03/2020-wall thickening at the distal esophagus and stomach body/antrum-progressive, indistinct stranding around the descending duodenum, porta hepatis, and retroperitoneum-stable, abnormal wall thickening at the left upper urinary bladder-stable ? Ramucirumab/paclitaxel 08/11/2020 ? Upper endoscopy 08/26/2020- gastric mucosa nearly completely replaced with cancer.  Most proximal aspect of the stomach was spared but starting at the proximal gastric body there was extensive, circumferential cancer that continued into the proximal duodenal bulb.  The tumor is ulcerated in some areas, edematous throughout, very friable with spontaneous oozing.  The lumen of the distal stomach was narrowed due to edematous malignant changes but this did not limit scope passage.  2.Left breast cancer 2008,pT1c,pN0, status post a left lumpectomy with adjuvant chemotherapy and radiation, ER positive, PR positive, HER-2 positive  3.Mixed connective tissue disease/SLE  4.Lower extremity deep vein thrombosis maintained on apixaban-placed on hold due to hematemesis, hematuria 08/19/2020.  See findings of upper endoscopy from 08/26/2020.  Dr. Ardis Hughs recommends against ever resuming a blood thinner.  5.Family history of multiple cancers including breast and ovarian cancer  6.Dysphagia and intermittent vomiting secondary to  #1  7.Hypertension  8.Peripheral neuropathy  9.Masslike fullness at the posterior right parotid/angle of the jaw  10. Dyspnea on exertion, ongoing for about a month, worsened after taxol/ramucirumab on 09/04/19 requiring ED visit on 5/23. CBC, CMP, troponin, BNP and chest xray negative  11.Anemia likely secondary to combination of chemotherapy and blood loss  2 units packed red blood cells 04/12/2020, 07/20/2020 12.Admission 05/11/2020 with pneumonia, CT chest revealed left upper and lower lobe opacities 13. Progressive abdominal pain beginning 07/29/2020-etiology unclear, likely related to progression of tumor in the stomach 14.  Massive PE with cor pulmonale May 2022 15.  Hospital admission 09/08/2020 massive syncope, PE, symptomatic anemia.  Ms. Avon Gully appears improved today.  She underwent mechanical thrombectomy last evening with a large amount of thrombus removed.  She remains on heparin and denies seeing any bleeding.  Her hemoglobin drawn just prior to my visit was much lower at 6.3.  This is being recollected to confirm.  The patient is aware of the difficult circumstances with regard to anticoagulation and her gastric cancer.  She understands that her disease is not curable.  She would like to continue aggressive interventions for now.  She has a second opinion pending at [facility].  We will try to reach out again to them to see if they could perform a virtual consult with the patient.  IVC filter placement can be considered as her condition stabilizes.  Recommendations: 1.  Monitor hemoglobin closely and transfuse per ICU parameters. 2.  Management of PE per PCCM.  Can consider IVC filter placement for her lower extremity DVT once her condition stabilizes.  Continue heparin anticoagulation for now 3.  Referral for second opinion to [facility] is still pending.  We will see if you [facility] can perform a virtual consultation with the patient.  Please call medical oncology  over the weekend for questions.  We will check on her on Monday if she remains in the hospital.   LOS: 2 days   Mikey Bussing, DNP, AGPCNP-BC, AOCNP 09/10/20 Ms. Townsend was interviewed and examined.  She appeared well when I saw her early this morning.  She reports feeling better following the thrombectomy procedure.  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  I favor continuing heparin anticoagulation for now.  I would have a goal heparin level in the low therapeutic range.  We can consider switching to Lovenox if she does not have significant bleeding while on heparin.  An IVC filter can be placed if she is not able to continue anticoagulation.  She has an overall poor prognosis.  She understands this.  She hopes to complete a visit with the GI tumor service at [facility] to discuss additional systemic treatment options.  Please call oncology as needed over the weekend.  I will check on here 09/13/2020  I was present for greater than 50% of today's visit.  I performed medical decision making.

## 2020-09-10 NOTE — Progress Notes (Signed)
Hypoglycemic Event  CBG: 50  Treatment: D50 50 mL (25 gm)  Symptoms: Hungry  Follow-up CBG: Time:0021  CBG Result:96  Possible Reasons for Event: Inadequate meal intake  Comments/MD notified:per protocol    Michelle Cain

## 2020-09-11 DIAGNOSIS — D649 Anemia, unspecified: Secondary | ICD-10-CM | POA: Diagnosis not present

## 2020-09-11 DIAGNOSIS — I2699 Other pulmonary embolism without acute cor pulmonale: Secondary | ICD-10-CM | POA: Diagnosis not present

## 2020-09-11 DIAGNOSIS — Z515 Encounter for palliative care: Secondary | ICD-10-CM | POA: Diagnosis not present

## 2020-09-11 DIAGNOSIS — R531 Weakness: Secondary | ICD-10-CM | POA: Diagnosis not present

## 2020-09-11 LAB — COMPREHENSIVE METABOLIC PANEL
ALT: 6 U/L (ref 0–44)
AST: 16 U/L (ref 15–41)
Albumin: 1.8 g/dL — ABNORMAL LOW (ref 3.5–5.0)
Alkaline Phosphatase: 60 U/L (ref 38–126)
Anion gap: 4 — ABNORMAL LOW (ref 5–15)
BUN: 10 mg/dL (ref 6–20)
CO2: 23 mmol/L (ref 22–32)
Calcium: 7.4 mg/dL — ABNORMAL LOW (ref 8.9–10.3)
Chloride: 107 mmol/L (ref 98–111)
Creatinine, Ser: 0.59 mg/dL (ref 0.44–1.00)
GFR, Estimated: >60 mL/min (ref >60)
Glucose, Bld: 124 mg/dL — ABNORMAL HIGH (ref 70–99)
Potassium: 3 mmol/L — ABNORMAL LOW (ref 3.5–5.1)
Sodium: 134 mmol/L — ABNORMAL LOW (ref 135–145)
Total Bilirubin: 0.6 mg/dL (ref 0.3–1.2)
Total Protein: 4.5 g/dL — ABNORMAL LOW (ref 6.5–8.1)

## 2020-09-11 LAB — CBC
HCT: 23.7 % — ABNORMAL LOW (ref 36.0–46.0)
Hemoglobin: 7.5 g/dL — ABNORMAL LOW (ref 12.0–15.0)
MCH: 29.3 pg (ref 26.0–34.0)
MCHC: 31.6 g/dL (ref 30.0–36.0)
MCV: 92.6 fL (ref 80.0–100.0)
Platelets: 138 10*3/uL — ABNORMAL LOW (ref 150–400)
RBC: 2.56 MIL/uL — ABNORMAL LOW (ref 3.87–5.11)
RDW: 19.3 % — ABNORMAL HIGH (ref 11.5–15.5)
WBC: 7 10*3/uL (ref 4.0–10.5)
nRBC: 4.1 % — ABNORMAL HIGH (ref 0.0–0.2)

## 2020-09-11 LAB — HEPARIN LEVEL (UNFRACTIONATED)
Heparin Unfractionated: 0.99 IU/mL — ABNORMAL HIGH (ref 0.30–0.70)
Heparin Unfractionated: 0.71 IU/mL — ABNORMAL HIGH (ref 0.30–0.70)
Heparin Unfractionated: >1.1 IU/mL — ABNORMAL HIGH (ref 0.30–0.70)

## 2020-09-11 LAB — GLUCOSE, CAPILLARY: Glucose-Capillary: 92 mg/dL (ref 70–99)

## 2020-09-11 MED ORDER — ONDANSETRON HCL 4 MG/2ML IJ SOLN
4.0000 mg | INTRAMUSCULAR | Status: AC
  Administered 2020-09-11: 4 mg via INTRAVENOUS

## 2020-09-11 MED ORDER — HEPARIN (PORCINE) 25000 UT/250ML-% IV SOLN
700.0000 [IU]/h | INTRAVENOUS | Status: DC
  Administered 2020-09-11: 700 [IU]/h via INTRAVENOUS
  Filled 2020-09-11: qty 250

## 2020-09-11 MED ORDER — POTASSIUM CHLORIDE CRYS ER 20 MEQ PO TBCR
40.0000 meq | EXTENDED_RELEASE_TABLET | Freq: Two times a day (BID) | ORAL | Status: AC
  Administered 2020-09-11 (×2): 40 meq via ORAL
  Filled 2020-09-11 (×2): qty 2

## 2020-09-11 NOTE — Consult Note (Addendum)
Chief Complaint:  History of gastric cancer with DVT. Request is for IVC filter placement  Referring Physician(s): Dr. Evette Doffing   Supervising Physician: Ruthann Cancer  Patient Status: [facility] - In-pt  History of Present Illness: Mashelle Busick is a 55 y.o. female History of LLE DVT, Gastric cancer, lupus, fibromyalgia, anemia, arthritis and Raynaud disease. Presented to the ED at [facility] with [facility] X 3 days and a near syncopal episode. Patient was found to slow to responsive, hypotensive and anemic with an elevated D-Dimer. I unit of PRBC was transfused. VQ scan reads  Multiple BILATERAL pulmonary perfusion defects consistent with pulmonary embolism. CT Angio chest reads Positive for acute PE with CT evidence of right heart strain (RV/LV Ratio = 1.41) consistent with at least submassive (intermediate risk) PE. The presence of right heart strain has been associated with an increased risk of morbidity and mortality. Please refer to the "PE Focused" order set in EPIC. Patient was transferred to [facility] where IR performed a PE thrombectomy on 5.26.22 with purse-string suture removed on 5.17.22. Team is requesting a IVC for prophylactic purposes. Patient has a RIJ central line that was placed by IR at the time of the thrombectomy.  Patient seen at bedside. Family members present. Currently without any significant complaints. Patient alert and sitting upright in bed, calm and comfortable. Return precautions and treatment recommendations and follow-up discussed with the patient who is agreeable with the plan.    Past Medical History:  Diagnosis Date  . Anemia   . Arthritis   . Breast CA ([place]) dx'd 2007   left  . Carpal tunnel syndrome on both sides   . DVT (deep venous thrombosis) ([facility])    Left leg  . Dyspnea    with activity,   . Fibromyalgia   . History of blood transfusion 08/17/2020  . Lupus ([place])   . Pneumonia    History of  . Raynaud disease   . stomach/colon ca dx'd 2020     Past Surgical History:  Procedure Laterality Date  . BREAST SURGERY Left    with lymph nodes  . CHOLECYSTECTOMY    . ESOPHAGOGASTRODUODENOSCOPY (EGD) WITH PROPOFOL N/A 08/26/2020   Procedure: ESOPHAGOGASTRODUODENOSCOPY (EGD) WITH PROPOFOL;  Surgeon: Milus Banister, MD;  Location: [facility] ENDOSCOPY;  Service: Endoscopy;  Laterality: N/A;  . IR ANGIOGRAM PULMONARY BILATERAL SELECTIVE  09/09/2020  . IR FLUORO GUIDE CV LINE RIGHT  09/09/2020  . IR THROMBECT PRIM MECH INIT (INCLU) MOD SED  09/09/2020  . IR US GUIDE VASC ACCESS RIGHT  09/09/2020  . IR US GUIDE VASC ACCESS RIGHT  09/09/2020  . RADIOLOGY WITH ANESTHESIA N/A 09/09/2020   Procedure: PULMONARY EMBOLUS THROMBECTOMY;  Surgeon: Radiologist, Medication, MD;  Location: [place];  Service: Radiology;  Laterality: N/A;  . UPPER GI ENDOSCOPY      Allergies: Patient has no known allergies.  Medications: Prior to Admission medications   Medication Sig Start Date End Date Taking? Authorizing Provider  azaTHIOprine (IMURAN) 50 MG tablet Take 50 mg by mouth 2 (two) times daily.   Yes [provider]  Cholecalciferol 50 MCG (2000 UT) CAPS Take 2,000 Units by mouth every evening.   Yes [provider]  docusate sodium (COLACE) 100 MG capsule Take 100 mg by mouth 2 (two) times daily as needed (constipation). 08/11/20  Yes Ladell Pier, MD  gabapentin (NEURONTIN) 300 MG capsule Take 300 mg by mouth in the morning and at bedtime. 05/29/18  Yes [provider]  HYDROcodone-acetaminophen (NORCO) 10-325 MG tablet Take 1 tablet by mouth every 6 (six) hours as needed for pain. 09/02/20  Yes [provider]  hydroxychloroquine (PLAQUENIL) 200 MG tablet Take 200 mg by mouth 2 (two) times daily.   Yes [provider]  metoCLOPramide (REGLAN) 10 MG tablet Take 1 tablet (10 mg total) by mouth 3 (three) times daily before meals. 04/27/20  Yes Owens Shark, NP  omeprazole (PRILOSEC) 40 MG capsule Take 1 capsule (40 mg  total) by mouth 2 (two) times daily before a meal for 60 doses. 08/26/20 09/25/20 Yes Milus Banister, MD  ondansetron (ZOFRAN) 8 MG tablet Take 1 tablet (8 mg total) by mouth every 8 (eight) hours as needed for nausea or vomiting. 05/26/20  Yes Ladell Pier, MD  oxyCODONE-acetaminophen (PERCOCET) 5-325 MG tablet Take 1-2 tablets by mouth every 6 (six) hours as needed for severe pain. 08/04/20  Yes Ladell Pier, MD  Polyethylene Glycol 3350 (MIRALAX PO) Take 17 g by mouth 2 (two) times daily as needed (constipation).   Yes [provider]  potassium chloride (KLOR-CON) 10 MEQ tablet Take 10 mEq by mouth every evening. 11/10/14  Yes [provider]  sucralfate (CARAFATE) 1 GM/10ML suspension Take 10 mLs (1 g total) by mouth 4 (four) times daily. 08/26/20 08/26/21 Yes Milus Banister, MD  traMADol (ULTRAM) 50 MG tablet Take 1 tablet (50 mg total) by mouth every 8 (eight) hours as needed for pain. 07/30/12  Yes Hudnall, Sharyn Lull, MD     Family History  Problem Relation Age of Onset  . Sudden death Neg Hx   . Hypertension Neg Hx   . Hyperlipidemia Neg Hx   . Heart attack Neg Hx   . Diabetes Neg Hx     Social History   Socioeconomic History  . Marital status: Divorced    Spouse name: Not on file  . Number of children: Not on file  . Years of education: Not on file  . Highest education level: Not on file  Occupational History  . Not on file  Tobacco Use  . Smoking status: Never Smoker  . Smokeless tobacco: Never Used  Vaping Use  . Vaping Use: Never used  Substance and Sexual Activity  . Alcohol use: No  . Drug use: No  . Sexual activity: Yes    Birth control/protection: Post-menopausal  Other Topics Concern  . Not on file  Social History Narrative  . Not on file   Social Determinants of Health   Financial Resource Strain: Not on file  Food Insecurity: Not on file  Transportation Needs: Not on file  Physical Activity: Not on file  Stress: Not on file   Social Connections: Not on file     Review of Systems: A 12 point ROS discussed and pertinent positives are indicated in the HPI above.  All other systems are negative.  Review of Systems  Constitutional: Negative for fatigue and fever.  HENT: Negative for congestion.   Respiratory: Negative for cough and shortness of breath.   Gastrointestinal: Negative for abdominal pain, diarrhea, nausea and vomiting.    Vital Signs: BP 112/65 (BP Location: Left Leg)   Pulse (!) 102   Temp 98 F (36.7 C) (Oral)   Resp 19   Wt 164 lb 7.4 oz (74.6 kg)   SpO2 100%   BMI 28.23 kg/m   Physical Exam Vitals and nursing note reviewed.  Constitutional:      Appearance: She is well-developed.  HENT:     Head: Normocephalic and atraumatic.  Eyes:     Conjunctiva/sclera: Conjunctivae normal.  Cardiovascular:     Rate and Rhythm: Regular rhythm. Tachycardia present.     Comments: 100. Pulmonary:     Breath sounds: Normal breath sounds.  Musculoskeletal:        General: Normal range of motion.     Cervical back: Normal range of motion.  Skin:    General: Skin is warm.  Neurological:     Mental Status: She is alert and oriented to person, place, and time.     Imaging: CT ANGIO CHEST PE W OR WO CONTRAST  Result Date: 09/09/2020 CLINICAL DATA:  55 year old female with history of abnormal V/Q scan. History of breast cancer. EXAM: CT ANGIOGRAPHY CHEST WITH CONTRAST TECHNIQUE: Multidetector CT imaging of the chest was performed using the standard protocol during bolus administration of intravenous contrast. Multiplanar CT image reconstructions and MIPs were obtained to evaluate the vascular anatomy. CONTRAST:  134mL OMNIPAQUE IOHEXOL 350 MG/ML SOLN COMPARISON:  Chest CTA 08/16/2020. FINDINGS: Cardiovascular: Numerous filling defects throughout the pulmonary arterial tree compatible with extensive pulmonary embolus. Specifically, there are 2 large saddle emboli with extension into lobar, segmental  and subsegmental sized branches in the lungs bilaterally. The majority of these emboli appear nonocclusive. Pulmonic trunk is currently nondilated measuring 2.3 cm in diameter. Right ventricle appears dilated with a diameter of approximately 52 mm. Left ventricular diameter is approximately 37 mm. RV to LV ratio of 1.41. There is no significant pericardial fluid, thickening or pericardial calcification. No atherosclerotic calcifications in the thoracic aorta or the coronary arteries. Left-sided subclavian single-lumen porta cath with tip terminating in the right atrium. Mediastinum/Nodes: No pathologically enlarged mediastinal or hilar lymph nodes. Esophagus is unremarkable in appearance. No axillary lymphadenopathy. Lungs/Pleura: There are some patchy areas of ground-glass attenuation and mild septal thickening in the lower lobes of the lungs bilaterally, nonspecific, potentially predominantly atelectatic, although areas of mild alveolar hemorrhage are difficult to exclude. No confluent consolidative airspace disease. Trace left pleural effusion. No definite suspicious appearing pulmonary nodules or masses are noted. Upper Abdomen: Small amount of perinephric stranding adjacent to the left kidney, nonspecific. Musculoskeletal: There are no aggressive appearing lytic or blastic lesions noted in the visualized portions of the skeleton. Review of the MIP images confirms the above findings. IMPRESSION: 1. Positive for acute PE with CT evidence of right heart strain (RV/LV Ratio = 1.41) consistent with at least submassive (intermediate risk) PE. The presence of right heart strain has been associated with an increased risk of morbidity and mortality. Please refer to the "PE Focused" order set in EPIC. 2. Small amount of ground-glass attenuation and septal thickening in the lower lobes of the lungs bilaterally. The possibility of areas of mild alveolar hemorrhage is not excluded, although most of this is likely  atelectatic. 3. Trace left pleural effusion. Critical Value/emergent results were discussed in person at the time of interpretation on 09/09/2020 at 3:44 pm to provider Dr. Ronny Bacon, who verbally acknowledged these results. Electronically Signed   By: Vinnie Langton M.D.   On: 09/09/2020 16:00   CT ANGIO CHEST PE W OR WO CONTRAST  Result Date: 08/16/2020 CLINICAL DATA:  Shortness of breath and chest pain. History of breast, gastric, and colon cancer. EXAM: CT ANGIOGRAPHY CHEST WITH CONTRAST TECHNIQUE: Multidetector CT imaging of the chest was performed using the standard protocol during bolus administration of intravenous contrast. Multiplanar CT image reconstructions and MIPs were obtained to  evaluate the vascular anatomy. CONTRAST:  130mL OMNIPAQUE IOHEXOL 350 MG/ML SOLN COMPARISON:  Chest CT May 11, 2020 FINDINGS: Cardiovascular: There is no demonstrable pulmonary embolus. A vessel in the left lower lobe makes an acute turn causing a subtle defect on the axial images which is not confirmed on other images and is not felt to represent pulmonary embolus. There is no thoracic aortic aneurysm or dissection. Visualized great vessels appear unremarkable. Port-A-Cath tip is in the superior vena cava. No evident pericardial effusion or pericardial thickening. Mediastinum/Nodes: Status post left thyroidectomy. Remaining thyroid appears normal. No appreciable adenopathy. There remains circumferential thickening in the distal esophagus which is unchanged from prior study. This area may represent previous treated neoplasm but also may represent esophagitis. The appearance in this area is stable. Lungs/Pleura: There is slight bibasilar atelectasis. No evident edema or airspace opacity. No appreciable pleural effusions. Trachea and major bronchial structures appear patent. No evident pneumothorax. Upper Abdomen: Gallbladder is absent. Occasional presumed calcified granulomas in the spleen. Incomplete visualization of  cyst arising from posterior right kidney measuring 0.8 x 0.8 cm. Musculoskeletal: No blastic or lytic bone lesions. Port present on the left anteriorly. No chest wall lesions appreciable. Review of the MIP images confirms the above findings. IMPRESSION: 1. No demonstrable pulmonary embolus. No thoracic aortic aneurysm or dissection. 2. Circumferential thickening distal esophagus again noted which may represent residua of previous treated neoplasm but also could represent a degree of esophagitis. 3.  No edema or airspace opacity.  Mild bibasilar atelectasis. 4.  No evident adenopathy. 5. Status post left-sided thyroidectomy. Status post cholecystectomy. 6.  Port-A-Cath tip in superior vena cava near cavoatrial junction. Electronically Signed   By: Lowella Grip III M.D.   On: 08/16/2020 13:31   NM Pulmonary Perfusion  Result Date: 09/09/2020 CLINICAL DATA:  Deep venous thrombosis, shortness of breath and chest pain for 3 days, elevated D-dimer, high clinical suspicion of pulmonary embolism EXAM: NUCLEAR MEDICINE PERFUSION LUNG SCAN TECHNIQUE: Perfusion images were obtained in multiple projections after intravenous injection of radiopharmaceutical. Ventilation scans intentionally deferred if perfusion scan and chest x-ray adequate for interpretation during COVID 19 epidemic. RADIOPHARMACEUTICALS:  4.25 mCi Tc-56m MAA IV COMPARISON:  Chest radiograph 09/08/2020 FINDINGS: Subsegmental perfusion defects identified within RIGHT middle lobe and lingula. Additional subsegmental perfusion defect LEFT lower lobe. Large multi segmental perfusion defect in RIGHT upper lobe. Findings are consistent with pulmonary embolism. Chest radiograph shows mild atelectasis or infiltrate in the RIGHT lower lobe, remaining lungs clear. IMPRESSION: Multiple BILATERAL pulmonary perfusion defects consistent with pulmonary embolism. Critical Value/emergent results were called by telephone at the time of interpretation on 09/09/2020 at  1200 hrs to provider Dr. Aileen Fass, who verbally acknowledged these results. Electronically Signed   By: Lavonia Dana M.D.   On: 09/09/2020 12:00   IR Angiogram Pulmonary Bilateral Selective  Result Date: 09/10/2020 INDICATION: 55 year old woman with massive pulmonary artery embolism presented with syncope and saddle PE. Interventional radiology consulted for pulmonary thrombectomy/thrombolysis. Patient unable to receive systemic tPA due to bleeding gastric mass. EXAM: 1. Ultrasound-guided access of right internal jugular vein 2. Central venous catheter placement 3. Ultrasound-guided access of right common femoral vein 4. Bilateral pulmonary angiogram and mechanical thrombectomy COMPARISON:  CT angiography chest 06/12/2020 MEDICATIONS: None ANESTHESIA/SEDATION: Sedation performed and monitored by the anesthesia team FLUOROSCOPY TIME:  Fluoroscopy Time: 20 minutes 48 seconds (83 mGy). COMPLICATIONS: None immediate. TECHNIQUE: Informed written consent was obtained from the patient after a thorough discussion of the procedural risks, benefits and  alternatives. All questions were addressed. Maximal Sterile Barrier Technique was utilized including caps, mask, sterile gowns, sterile gloves, sterile drape, hand hygiene and skin antiseptic. A timeout was performed prior to the initiation of the procedure. Patient positioned supine on the procedure table. Right neck and groin skin prepped and draped in usual fashion. The right internal jugular vein was evaluated with ultrasound and shown to be patent. A permanent ultrasound image was obtained and placed in the patient's medical record. Using sterile gel and a sterile probe cover, the right internal jugular vein was entered with a 21 ga needle during real time ultrasound guidance. 0.018 inch guidewire placed and 21 ga needle exchanged for transitional dilator set. Utilizing fluoroscopy, 0.035 inch guidewire advanced through the needle without difficulty. Serial dilation  performed, and catheter inserted over the guidewire. The tip was positioned in the right atrium. All lumens of the catheter aspirated and flushed well. The catheter was secured to the skin with suture. The insertion site was covered with a Biopatch and sterile dressing. The right common femoral vein was evaluated with ultrasound and shown to be patent. A permanent ultrasound image was obtained and placed in the patient's medical record. Using sterile gel and a sterile probe cover the right common femoral vein was entered with a 21 gauge needle during real time ultrasound guidance. 21 gauge needle exchanged for transitional dilator set over 0.018 in guidewire. Transitional dilator set exchanged for 6 French sheath over 0.035 in guidewire. 6 Fr MIV double angle pigtail catheter advanced to the main pulmonary artery utilizing fluoroscopic guidance. Pre thrombectomy pulmonary artery pressure: 39/11 mmHg (mean 26 mmHg) Double angled pigtail catheter removed over exchange length Amplatz guidewire. Vert catheter utilized to advance Amplatz guidewire into right lower lobe pulmonary artery branch. Serial dilation was performed and 6 [place] sheath exchanged for [employer] 24 French sheath. 38 [place] FlowTriver device was advanced over the wire into the lateral basilar segmental and Truncus Anterior branches of the right pulmonary arterial system. Mechanical aspiration thrombectomy was performed extirpating thrombus. Post thrombectomy pulmonary angiogram demonstrated significant reduction in overall clot burden in the right pulmonary arterial branches. The left lateral basilar segmental branch of the lower lobe artery was selected with the [facility] catheter. The 20 [place] FlowTriver device was advanced through the 24 French device over the wire into the lateral basilar segmental branch of the left pulmonary arterial system. Mechanical aspiration thrombectomy was performed extirpating thrombus. Post aspiration  pulmonary angiogram demonstrated significant reduction in overall clot burden. Post thrombectomy Pulmonary artery pressure: 27/7 mmHg (mean 15 mmHg) FlowTriever device was removed. The [place] Dry Seal sheath was removed and hemostasis achieved with purse-string suture and manual compression. IMPRESSION: 1. Bilateral pulmonary angiogram and mechanical thrombectomy. Mean pulmonary artery pressure decreased from 26 mm Hg to 15 mm Hg following thrombectomy. 2. Right IJ non tunneled central line placement. Electronically Signed   By: Miachel Roux M.D.   On: 09/10/2020 12:35   IR Fluoro Guide CV Line Right  Result Date: 09/10/2020 INDICATION: 56 year old woman with massive pulmonary artery embolism presented with syncope and saddle PE. Interventional radiology consulted for pulmonary thrombectomy/thrombolysis. Patient unable to receive systemic tPA due to bleeding gastric mass. EXAM: 1. Ultrasound-guided access of right internal jugular vein 2. Central venous catheter placement 3. Ultrasound-guided access of right common femoral vein 4. Bilateral pulmonary angiogram and mechanical thrombectomy COMPARISON:  CT angiography chest 06/12/2020 MEDICATIONS: None ANESTHESIA/SEDATION: Sedation performed and monitored by the anesthesia team FLUOROSCOPY TIME:  Fluoroscopy Time:  20 minutes 48 seconds (83 mGy). COMPLICATIONS: None immediate. TECHNIQUE: Informed written consent was obtained from the patient after a thorough discussion of the procedural risks, benefits and alternatives. All questions were addressed. Maximal Sterile Barrier Technique was utilized including caps, mask, sterile gowns, sterile gloves, sterile drape, hand hygiene and skin antiseptic. A timeout was performed prior to the initiation of the procedure. Patient positioned supine on the procedure table. Right neck and groin skin prepped and draped in usual fashion. The right internal jugular vein was evaluated with ultrasound and shown to be patent. A permanent  ultrasound image was obtained and placed in the patient's medical record. Using sterile gel and a sterile probe cover, the right internal jugular vein was entered with a 21 ga needle during real time ultrasound guidance. 0.018 inch guidewire placed and 21 ga needle exchanged for transitional dilator set. Utilizing fluoroscopy, 0.035 inch guidewire advanced through the needle without difficulty. Serial dilation performed, and catheter inserted over the guidewire. The tip was positioned in the right atrium. All lumens of the catheter aspirated and flushed well. The catheter was secured to the skin with suture. The insertion site was covered with a Biopatch and sterile dressing. The right common femoral vein was evaluated with ultrasound and shown to be patent. A permanent ultrasound image was obtained and placed in the patient's medical record. Using sterile gel and a sterile probe cover the right common femoral vein was entered with a 21 gauge needle during real time ultrasound guidance. 21 gauge needle exchanged for transitional dilator set over 0.018 in guidewire. Transitional dilator set exchanged for 6 French sheath over 0.035 in guidewire. 6 Fr MIV double angle pigtail catheter advanced to the main pulmonary artery utilizing fluoroscopic guidance. Pre thrombectomy pulmonary artery pressure: 39/11 mmHg (mean 26 mmHg) Double angled pigtail catheter removed over exchange length Amplatz guidewire. Vert catheter utilized to advance Amplatz guidewire into right lower lobe pulmonary artery branch. Serial dilation was performed and 6 [place] sheath exchanged for [employer] 24 French sheath. 45 [place] FlowTriver device was advanced over the wire into the lateral basilar segmental and Truncus Anterior branches of the right pulmonary arterial system. Mechanical aspiration thrombectomy was performed extirpating thrombus. Post thrombectomy pulmonary angiogram demonstrated significant reduction in overall clot burden  in the right pulmonary arterial branches. The left lateral basilar segmental branch of the lower lobe artery was selected with the [facility] catheter. The 20 [place] FlowTriver device was advanced through the 24 French device over the wire into the lateral basilar segmental branch of the left pulmonary arterial system. Mechanical aspiration thrombectomy was performed extirpating thrombus. Post aspiration pulmonary angiogram demonstrated significant reduction in overall clot burden. Post thrombectomy Pulmonary artery pressure: 27/7 mmHg (mean 15 mmHg) FlowTriever device was removed. The [place] Dry Seal sheath was removed and hemostasis achieved with purse-string suture and manual compression. IMPRESSION: 1. Bilateral pulmonary angiogram and mechanical thrombectomy. Mean pulmonary artery pressure decreased from 26 mm Hg to 15 mm Hg following thrombectomy. 2. Right IJ non tunneled central line placement. Electronically Signed   By: Miachel Roux M.D.   On: 09/10/2020 12:35   IR THROMBECT PRIM MECH INIT (INCLU) MOD SED  Result Date: 09/10/2020 INDICATION: 55 year old woman with massive pulmonary artery embolism presented with syncope and saddle PE. Interventional radiology consulted for pulmonary thrombectomy/thrombolysis. Patient unable to receive systemic tPA due to bleeding gastric mass. EXAM: 1. Ultrasound-guided access of right internal jugular vein 2. Central venous catheter placement 3. Ultrasound-guided access of right common femoral  vein 4. Bilateral pulmonary angiogram and mechanical thrombectomy COMPARISON:  CT angiography chest 06/12/2020 MEDICATIONS: None ANESTHESIA/SEDATION: Sedation performed and monitored by the anesthesia team FLUOROSCOPY TIME:  Fluoroscopy Time: 20 minutes 48 seconds (83 mGy). COMPLICATIONS: None immediate. TECHNIQUE: Informed written consent was obtained from the patient after a thorough discussion of the procedural risks, benefits and alternatives. All questions were addressed.  Maximal Sterile Barrier Technique was utilized including caps, mask, sterile gowns, sterile gloves, sterile drape, hand hygiene and skin antiseptic. A timeout was performed prior to the initiation of the procedure. Patient positioned supine on the procedure table. Right neck and groin skin prepped and draped in usual fashion. The right internal jugular vein was evaluated with ultrasound and shown to be patent. A permanent ultrasound image was obtained and placed in the patient's medical record. Using sterile gel and a sterile probe cover, the right internal jugular vein was entered with a 21 ga needle during real time ultrasound guidance. 0.018 inch guidewire placed and 21 ga needle exchanged for transitional dilator set. Utilizing fluoroscopy, 0.035 inch guidewire advanced through the needle without difficulty. Serial dilation performed, and catheter inserted over the guidewire. The tip was positioned in the right atrium. All lumens of the catheter aspirated and flushed well. The catheter was secured to the skin with suture. The insertion site was covered with a Biopatch and sterile dressing. The right common femoral vein was evaluated with ultrasound and shown to be patent. A permanent ultrasound image was obtained and placed in the patient's medical record. Using sterile gel and a sterile probe cover the right common femoral vein was entered with a 21 gauge needle during real time ultrasound guidance. 21 gauge needle exchanged for transitional dilator set over 0.018 in guidewire. Transitional dilator set exchanged for 6 French sheath over 0.035 in guidewire. 6 Fr MIV double angle pigtail catheter advanced to the main pulmonary artery utilizing fluoroscopic guidance. Pre thrombectomy pulmonary artery pressure: 39/11 mmHg (mean 26 mmHg) Double angled pigtail catheter removed over exchange length Amplatz guidewire. Vert catheter utilized to advance Amplatz guidewire into right lower lobe pulmonary artery branch.  Serial dilation was performed and 6 [place] sheath exchanged for [employer] 24 French sheath. 32 [place] FlowTriver device was advanced over the wire into the lateral basilar segmental and Truncus Anterior branches of the right pulmonary arterial system. Mechanical aspiration thrombectomy was performed extirpating thrombus. Post thrombectomy pulmonary angiogram demonstrated significant reduction in overall clot burden in the right pulmonary arterial branches. The left lateral basilar segmental branch of the lower lobe artery was selected with the [facility] catheter. The 20 [place] FlowTriver device was advanced through the 24 French device over the wire into the lateral basilar segmental branch of the left pulmonary arterial system. Mechanical aspiration thrombectomy was performed extirpating thrombus. Post aspiration pulmonary angiogram demonstrated significant reduction in overall clot burden. Post thrombectomy Pulmonary artery pressure: 27/7 mmHg (mean 15 mmHg) FlowTriever device was removed. The Gore Dry Seal sheath was removed and hemostasis achieved with purse-string suture and manual compression. IMPRESSION: 1. Bilateral pulmonary angiogram and mechanical thrombectomy. Mean pulmonary artery pressure decreased from 26 mm Hg to 15 mm Hg following thrombectomy. 2. Right IJ non tunneled central line placement. Electronically Signed   By: Miachel Roux M.D.   On: 09/10/2020 12:35   IR US Guide Vasc Access Right  Result Date: 09/10/2020 INDICATION: 55 year old woman with massive pulmonary artery embolism presented with syncope and saddle PE. Interventional radiology consulted for pulmonary thrombectomy/thrombolysis. Patient unable to receive  systemic tPA due to bleeding gastric mass. EXAM: 1. Ultrasound-guided access of right internal jugular vein 2. Central venous catheter placement 3. Ultrasound-guided access of right common femoral vein 4. Bilateral pulmonary angiogram and mechanical thrombectomy  COMPARISON:  CT angiography chest 06/12/2020 MEDICATIONS: None ANESTHESIA/SEDATION: Sedation performed and monitored by the anesthesia team FLUOROSCOPY TIME:  Fluoroscopy Time: 20 minutes 48 seconds (83 mGy). COMPLICATIONS: None immediate. TECHNIQUE: Informed written consent was obtained from the patient after a thorough discussion of the procedural risks, benefits and alternatives. All questions were addressed. Maximal Sterile Barrier Technique was utilized including caps, mask, sterile gowns, sterile gloves, sterile drape, hand hygiene and skin antiseptic. A timeout was performed prior to the initiation of the procedure. Patient positioned supine on the procedure table. Right neck and groin skin prepped and draped in usual fashion. The right internal jugular vein was evaluated with ultrasound and shown to be patent. A permanent ultrasound image was obtained and placed in the patient's medical record. Using sterile gel and a sterile probe cover, the right internal jugular vein was entered with a 21 ga needle during real time ultrasound guidance. 0.018 inch guidewire placed and 21 ga needle exchanged for transitional dilator set. Utilizing fluoroscopy, 0.035 inch guidewire advanced through the needle without difficulty. Serial dilation performed, and catheter inserted over the guidewire. The tip was positioned in the right atrium. All lumens of the catheter aspirated and flushed well. The catheter was secured to the skin with suture. The insertion site was covered with a Biopatch and sterile dressing. The right common femoral vein was evaluated with ultrasound and shown to be patent. A permanent ultrasound image was obtained and placed in the patient's medical record. Using sterile gel and a sterile probe cover the right common femoral vein was entered with a 21 gauge needle during real time ultrasound guidance. 21 gauge needle exchanged for transitional dilator set over 0.018 in guidewire. Transitional dilator set  exchanged for 6 French sheath over 0.035 in guidewire. 6 Fr MIV double angle pigtail catheter advanced to the main pulmonary artery utilizing fluoroscopic guidance. Pre thrombectomy pulmonary artery pressure: 39/11 mmHg (mean 26 mmHg) Double angled pigtail catheter removed over exchange length Amplatz guidewire. Vert catheter utilized to advance Amplatz guidewire into right lower lobe pulmonary artery branch. Serial dilation was performed and 6 [place] sheath exchanged for [employer] 24 French sheath. 15 [place] FlowTriver device was advanced over the wire into the lateral basilar segmental and Truncus Anterior branches of the right pulmonary arterial system. Mechanical aspiration thrombectomy was performed extirpating thrombus. Post thrombectomy pulmonary angiogram demonstrated significant reduction in overall clot burden in the right pulmonary arterial branches. The left lateral basilar segmental branch of the lower lobe artery was selected with the [facility] catheter. The 20 [place] FlowTriver device was advanced through the 24 French device over the wire into the lateral basilar segmental branch of the left pulmonary arterial system. Mechanical aspiration thrombectomy was performed extirpating thrombus. Post aspiration pulmonary angiogram demonstrated significant reduction in overall clot burden. Post thrombectomy Pulmonary artery pressure: 27/7 mmHg (mean 15 mmHg) FlowTriever device was removed. The [place] Dry Seal sheath was removed and hemostasis achieved with purse-string suture and manual compression. IMPRESSION: 1. Bilateral pulmonary angiogram and mechanical thrombectomy. Mean pulmonary artery pressure decreased from 26 mm Hg to 15 mm Hg following thrombectomy. 2. Right IJ non tunneled central line placement. Electronically Signed   By: Miachel Roux M.D.   On: 09/10/2020 12:35   IR US Guide Vasc Access Right  Result Date: 09/10/2020 INDICATION: 55 year old woman with massive pulmonary artery  embolism presented with syncope and saddle PE. Interventional radiology consulted for pulmonary thrombectomy/thrombolysis. Patient unable to receive systemic tPA due to bleeding gastric mass. EXAM: 1. Ultrasound-guided access of right internal jugular vein 2. Central venous catheter placement 3. Ultrasound-guided access of right common femoral vein 4. Bilateral pulmonary angiogram and mechanical thrombectomy COMPARISON:  CT angiography chest 06/12/2020 MEDICATIONS: None ANESTHESIA/SEDATION: Sedation performed and monitored by the anesthesia team FLUOROSCOPY TIME:  Fluoroscopy Time: 20 minutes 48 seconds (83 mGy). COMPLICATIONS: None immediate. TECHNIQUE: Informed written consent was obtained from the patient after a thorough discussion of the procedural risks, benefits and alternatives. All questions were addressed. Maximal Sterile Barrier Technique was utilized including caps, mask, sterile gowns, sterile gloves, sterile drape, hand hygiene and skin antiseptic. A timeout was performed prior to the initiation of the procedure. Patient positioned supine on the procedure table. Right neck and groin skin prepped and draped in usual fashion. The right internal jugular vein was evaluated with ultrasound and shown to be patent. A permanent ultrasound image was obtained and placed in the patient's medical record. Using sterile gel and a sterile probe cover, the right internal jugular vein was entered with a 21 ga needle during real time ultrasound guidance. 0.018 inch guidewire placed and 21 ga needle exchanged for transitional dilator set. Utilizing fluoroscopy, 0.035 inch guidewire advanced through the needle without difficulty. Serial dilation performed, and catheter inserted over the guidewire. The tip was positioned in the right atrium. All lumens of the catheter aspirated and flushed well. The catheter was secured to the skin with suture. The insertion site was covered with a Biopatch and sterile dressing. The right  common femoral vein was evaluated with ultrasound and shown to be patent. A permanent ultrasound image was obtained and placed in the patient's medical record. Using sterile gel and a sterile probe cover the right common femoral vein was entered with a 21 gauge needle during real time ultrasound guidance. 21 gauge needle exchanged for transitional dilator set over 0.018 in guidewire. Transitional dilator set exchanged for 6 French sheath over 0.035 in guidewire. 6 Fr MIV double angle pigtail catheter advanced to the main pulmonary artery utilizing fluoroscopic guidance. Pre thrombectomy pulmonary artery pressure: 39/11 mmHg (mean 26 mmHg) Double angled pigtail catheter removed over exchange length Amplatz guidewire. Vert catheter utilized to advance Amplatz guidewire into right lower lobe pulmonary artery branch. Serial dilation was performed and 6 [place] sheath exchanged for [employer] 24 French sheath. 12 [place] FlowTriver device was advanced over the wire into the lateral basilar segmental and Truncus Anterior branches of the right pulmonary arterial system. Mechanical aspiration thrombectomy was performed extirpating thrombus. Post thrombectomy pulmonary angiogram demonstrated significant reduction in overall clot burden in the right pulmonary arterial branches. The left lateral basilar segmental branch of the lower lobe artery was selected with the [facility] catheter. The 20 [place] FlowTriver device was advanced through the 24 French device over the wire into the lateral basilar segmental branch of the left pulmonary arterial system. Mechanical aspiration thrombectomy was performed extirpating thrombus. Post aspiration pulmonary angiogram demonstrated significant reduction in overall clot burden. Post thrombectomy Pulmonary artery pressure: 27/7 mmHg (mean 15 mmHg) FlowTriever device was removed. The [place] Dry Seal sheath was removed and hemostasis achieved with purse-string suture and manual  compression. IMPRESSION: 1. Bilateral pulmonary angiogram and mechanical thrombectomy. Mean pulmonary artery pressure decreased from 26 mm Hg to 15 mm Hg following thrombectomy. 2. Right  IJ non tunneled central line placement. Electronically Signed   By: Miachel Roux M.D.   On: 09/10/2020 12:35   DG Chest Port 1 View  Result Date: 09/08/2020 CLINICAL DATA:  Shortness of breath.  Syncopal episodes. EXAM: PORTABLE CHEST 1 VIEW COMPARISON:  09/07/2019 FINDINGS: Stable appearance of the left subclavian Port-A-Cath with the tip extending into the upper right atrium. There is a chronic kink in the port near the inferior aspect of the left clavicle. Lungs are clear. Heart size is normal. Trachea is midline. No acute bone abnormality. IMPRESSION: 1. No acute cardiopulmonary disease. 2. Stable appearance of the left subclavian port with a kink in the catheter near the left clavicle. 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 Electronically Signed   By: Markus Daft M.D.   On: 09/08/2020 13:00   ECHOCARDIOGRAM COMPLETE  Result Date: 09/09/2020    ECHOCARDIOGRAM REPORT   Patient Name:   LATRIA MCCARRON Date of Exam: 09/09/2020 Medical Rec #:  [id number]         Height:       64.0 in Accession #:    [id number]        Weight:       164.5 lb Date of Birth:  08-Mar-1966         BSA:          1.800 m Patient Age:    89 years          BP:           91/62 mmHg Patient Gender: F                 HR:           114 bpm. Exam Location:  Inpatient Procedure: 2D Echo, Cardiac Doppler and Color Doppler Indications:    Elevated troponin  History:        Patient has no prior history of Echocardiogram examinations.                 Signs/Symptoms:Syncope and Elevated Troponins. Gastric cancer,                 breast cancer, anemia, Chemo.  Sonographer:    Dustin Flock Referring Phys: [id number] [place]  1. There is sinus tachycardia with severe RV dilation and moderately reduced  RV function. 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  2. Left ventricular ejection fraction, by estimation, is 60 to 65%. The left ventricle has normal function. The left ventricle has no regional wall motion abnormalities. Indeterminate diastolic filling due to E-A fusion. There is the interventricular septum is flattened in systole and diastole, consistent with right ventricular pressure and volume overload.  3. Right ventricular systolic function is moderately reduced. The right ventricular size is severely enlarged. There is mildly elevated pulmonary artery systolic pressure. The estimated right ventricular systolic pressure is 75.1 mmHg.  4. The mitral valve is grossly normal. No evidence of mitral valve regurgitation. No evidence of mitral stenosis.  5. The aortic valve is tricuspid. Aortic valve regurgitation is not visualized. No aortic stenosis is present.  6. The inferior vena cava is normal in size with greater than 50% respiratory variability, suggesting right atrial pressure of 3 mmHg. FINDINGS  Left Ventricle: Left ventricular ejection fraction, by estimation, is 60 to 65%. The left ventricle has normal function. The left ventricle has no regional wall motion abnormalities. The left ventricular internal cavity size  was normal in size. There is  no left ventricular hypertrophy. The interventricular septum is flattened in systole and diastole, consistent with right ventricular pressure and volume overload. Indeterminate diastolic filling due to E-A fusion. Right Ventricle: The right ventricular size is severely enlarged. No increase in right ventricular wall thickness. Right ventricular systolic function is moderately reduced. There is mildly elevated pulmonary artery systolic pressure. The tricuspid regurgitant velocity is 2.93 m/s, and with an assumed right atrial pressure of 3 mmHg, the estimated right ventricular systolic pressure is 40.9 mmHg. Left Atrium: Left atrial size was normal  in size. Right Atrium: Right atrial size was normal in size. Pericardium: Trivial pericardial effusion is present. Mitral Valve: The mitral valve is grossly normal. No evidence of mitral valve regurgitation. No evidence of mitral valve stenosis. Tricuspid Valve: The tricuspid valve is grossly normal. Tricuspid valve regurgitation is mild . No evidence of tricuspid stenosis. Aortic Valve: The aortic valve is tricuspid. Aortic valve regurgitation is not visualized. No aortic stenosis is present. Pulmonic Valve: The pulmonic valve was grossly normal. Pulmonic valve regurgitation is not visualized. No evidence of pulmonic stenosis. Aorta: The aortic root and ascending aorta are structurally normal, with no evidence of dilitation. Venous: The inferior vena cava is normal in size with greater than 50% respiratory variability, suggesting right atrial pressure of 3 mmHg. IAS/Shunts: The atrial septum is grossly normal. Additional Comments: A venous catheter is visualized in the right atrium.  LEFT VENTRICLE PLAX 2D LVIDd:         3.20 cm     Diastology LVIDs:         2.40 cm     LV e' medial:    8.05 cm/s LV PW:         1.10 cm     LV E/e' medial:  6.2 LV IVS:        1.20 cm     LV e' lateral:   7.62 cm/s LVOT diam:     1.80 cm     LV E/e' lateral: 6.5 LV SV:         23 LV SV Index:   13 LVOT Area:     2.54 cm  LV Volumes (MOD) LV vol d, MOD A4C: 47.5 ml LV vol s, MOD A4C: 20.7 ml LV SV MOD A4C:     47.5 ml RIGHT VENTRICLE RV Basal diam:  4.10 cm RV S prime:     8.27 cm/s TAPSE (M-mode): 1.6 cm LEFT ATRIUM             Index       RIGHT ATRIUM          Index LA diam:        2.30 cm 1.28 cm/m  RA Area:     9.61 cm LA Vol (A2C):   14.9 ml 8.28 ml/m  RA Volume:   21.80 ml 12.11 ml/m LA Vol (A4C):   25.8 ml 14.33 ml/m LA Biplane Vol: 21.4 ml 11.89 ml/m  AORTIC VALVE LVOT Vmax:   81.70 cm/s LVOT Vmean:  51.100 cm/s LVOT VTI:    0.092 m  AORTA Ao Root diam: 2.60 cm MITRAL VALVE               TRICUSPID VALVE MV Area (PHT):  6.22 cm    TR Peak grad:   34.3 mmHg MV Decel Time: 122 msec    TR Vmax:        293.00 cm/s MV E velocity: 49.80 cm/s  MV A velocity: 78.20 cm/s  SHUNTS MV E/A ratio:  0.64        Systemic VTI:  0.09 m                            Systemic Diam: 1.80 cm Eleonore Chiquito MD Electronically signed by Eleonore Chiquito MD Signature Date/Time: 09/09/2020/12:04:31 PM    Final    VAS [place] LOWER EXTREMITY VENOUS (DVT)  Result Date: 09/09/2020  Lower Venous DVT Study Patient Name:  MISTEY HOFFERT  Date of Exam:   09/09/2020 Medical Rec #: [id number]          Accession #:    [id number] Date of Birth: [birth year]          Patient Gender: F Patient Age:   34Y Exam Location:  [facility] Procedure:      VAS [place] LOWER EXTREMITY VENOUS (DVT) Referring Phys: [id number] Silvestre Moment [facility] --------------------------------------------------------------------------------  Indications: Pulmonary embolism.  Comparison Study: no prior Performing Technologist: Abram Sander RVS  Examination Guidelines: A complete evaluation includes B-mode imaging, spectral Doppler, color Doppler, and power Doppler as needed of all accessible portions of each vessel. Bilateral testing is considered an integral part of a complete examination. Limited examinations for reoccurring indications may be performed as noted. The reflux portion of the exam is performed with the patient in reverse Trendelenburg.  +---------+---------------+---------+-----------+----------+--------------+ RIGHT    CompressibilityPhasicitySpontaneityPropertiesThrombus Aging +---------+---------------+---------+-----------+----------+--------------+ CFV      Full           Yes      Yes                                 +---------+---------------+---------+-----------+----------+--------------+ SFJ      Full                                                        +---------+---------------+---------+-----------+----------+--------------+ FV Prox  Full                                                         +---------+---------------+---------+-----------+----------+--------------+ FV Mid   Full                                                        +---------+---------------+---------+-----------+----------+--------------+ FV DistalFull                                                        +---------+---------------+---------+-----------+----------+--------------+ PFV      Full                                                        +---------+---------------+---------+-----------+----------+--------------+  POP      Full           Yes      Yes                                 +---------+---------------+---------+-----------+----------+--------------+ PTV      Full                                                        +---------+---------------+---------+-----------+----------+--------------+ PERO     Full                                                        +---------+---------------+---------+-----------+----------+--------------+   +---------+---------------+---------+-----------+----------+-----------------+ LEFT     CompressibilityPhasicitySpontaneityPropertiesThrombus Aging    +---------+---------------+---------+-----------+----------+-----------------+ CFV      Full           Yes      Yes                                    +---------+---------------+---------+-----------+----------+-----------------+ SFJ      Full                                                           +---------+---------------+---------+-----------+----------+-----------------+ FV Prox  Full                                                           +---------+---------------+---------+-----------+----------+-----------------+ FV Mid   Full                                                           +---------+---------------+---------+-----------+----------+-----------------+ FV DistalFull                                                            +---------+---------------+---------+-----------+----------+-----------------+ PFV      Full                                                           +---------+---------------+---------+-----------+----------+-----------------+ POP      None           Yes  Yes                  Age Indeterminate +---------+---------------+---------+-----------+----------+-----------------+ PTV      None                                         Age Indeterminate +---------+---------------+---------+-----------+----------+-----------------+ PERO     None                                         Age Indeterminate +---------+---------------+---------+-----------+----------+-----------------+     Summary: RIGHT: - There is no evidence of deep vein thrombosis in the lower extremity.  - No cystic structure found in the popliteal fossa.  LEFT: - Findings consistent with age indeterminate deep vein thrombosis involving the left popliteal vein, left posterior tibial veins, and left peroneal veins. - No cystic structure found in the popliteal fossa.  *See table(s) above for measurements and observations. Electronically signed by Servando Snare MD on 09/09/2020 at 4:03:54 PM.    Final    VAS [place] UPPER EXTREMITY VENOUS DUPLEX  Result Date: 09/10/2020 UPPER VENOUS STUDY  Patient Name:  KAILYN VANDERSLICE  Date of Exam:   09/10/2020 Medical Rec #: [id number]          Accession #:    [id number] Date of Birth: 1965-08-07          Patient Gender: F Patient Age:   89Y Exam Location:  [facility] Procedure:      VAS [place] UPPER EXTREMITY VENOUS DUPLEX Referring Phys: [id number] [place] --------------------------------------------------------------------------------  Indications: Edema Limitations: Poor ultrasound/tissue interface, bandages and line. Comparison Study: No prior study Performing Technologist: Maudry Mayhew MHA, RDMS, RVT, RDCS  Examination Guidelines: A complete evaluation  includes B-mode imaging, spectral Doppler, color Doppler, and power Doppler as needed of all accessible portions of each vessel. Bilateral testing is considered an integral part of a complete examination. Limited examinations for reoccurring indications may be performed as noted.  Right Findings: +----------+------------+---------+-----------+--------------+-----------------+ RIGHT     CompressiblePhasicitySpontaneous  Properties       Summary      +----------+------------+---------+-----------+--------------+-----------------+ IJV                      Yes       Yes                                    +----------+------------+---------+-----------+--------------+-----------------+ Subclavian    None                 No                         Acute       +----------+------------+---------+-----------+--------------+-----------------+ Axillary      None                 Yes      partially   Age Indeterminate                                           re-cannulated                   +----------+------------+---------+-----------+--------------+-----------------+  Brachial      Full                                                        +----------+------------+---------+-----------+--------------+-----------------+ Radial        Full                                                        +----------+------------+---------+-----------+--------------+-----------------+ Ulnar         Full                                                        +----------+------------+---------+-----------+--------------+-----------------+ Cephalic      Full                                                        +----------+------------+---------+-----------+--------------+-----------------+ Basilic       Full                                                        +----------+------------+---------+-----------+--------------+-----------------+  Left Findings:  +----------+------------+---------+-----------+----------+-------+ LEFT      CompressiblePhasicitySpontaneousPropertiesSummary +----------+------------+---------+-----------+----------+-------+ Subclavian               Yes       Yes                      +----------+------------+---------+-----------+----------+-------+  Summary:  Right: Findings consistent with acute deep vein thrombosis involving the right subclavian vein. Findings consistent with age indeterminate deep vein thrombosis involving the right axillary vein.  Left: No evidence of thrombosis in the subclavian.  *See table(s) above for measurements and observations.  Diagnosing physician: Monica Martinez MD Electronically signed by Monica Martinez MD on 09/10/2020 at 4:32:26 PM.    Final     Labs:  CBC: Recent Labs    09/10/20 0057 09/10/20 0400 09/10/20 2563 09/10/20 1110 09/10/20 1221 09/10/20 1626 09/10/20 2100 09/11/20 0500  WBC 9.0 8.0  --  6.6  --   --   --  7.0  HGB 9.2* 9.0*   < > 6.3* 8.1* 8.1* 8.0* 7.5*  HCT 28.4* 28.1*   < > 19.6* 25.8* 26.1* 25.1* 23.7*  PLT 124* 127*  --  96*  --   --   --  138*   < > = values in this interval not displayed.    COAGS: Recent Labs    09/09/20 1818 09/09/20 2217 09/10/20 0049 09/10/20 0500  INR 1.0  --   --   --   APTT  --  >200* 199* 47*    BMP: Recent Labs    11/25/19 1037 12/09/19 1136 12/23/19 1110 01/06/20  5852 01/20/20 1149 09/01/20 0950 09/08/20 1213 09/09/20 0330 09/11/20 0500  NA 140 140 138 140   < > 136 135 136 134*  K 4.0 4.0 4.3 3.8   < > 4.0 3.9 3.9 3.0*  CL 107 107 107 108   < > 104 103 107 107  CO2 25 26 26 26    < > 24 22 20* 23  GLUCOSE 87 85 80 99   < > 116* 185* 96 124*  BUN 9 11 11 10    < > 11 17 14 10   CALCIUM 9.2 8.9 8.6* 8.5*   < > 8.5* 8.4* 7.6* 7.4*  CREATININE 0.81 0.84 0.77 0.80   < > 0.43* 0.76 0.36* 0.59  GFRNONAA >60 >60 >60 >60   < > >60 >60 >60 >60  GFRAA >60 >60 >60 >60  --   --   --   --   --    < > =  values in this interval not displayed.    LIVER FUNCTION TESTS: Recent Labs    09/01/20 0950 09/08/20 1213 09/09/20 0330 09/11/20 0500  BILITOT 0.4 0.2* 0.9 0.6  AST 16 24 18 16   ALT 7 8 8 6   ALKPHOS 85 101 79 60  PROT 6.5 6.7 5.5* 4.5*  ALBUMIN 3.4* 2.9* 2.4* 1.8*     Assessment and Plan:  55 y.o. female inpatient. History of LLE DVT, Gastric cancer, lupus, fibromyalgia, anemia, arthritis and Raynaud disease. Presented to the ED at [facility] with [facility] X 3 days and a near syncopal episode. Patient was found to slow to responsive, hypotensive and anemic with an elevated D-Dimer. I unit of PRBC was transfused. VQ scan reads  Multiple BILATERAL pulmonary perfusion defects consistent with pulmonary embolism. CT Angio chest reads Positive for acute PE with CT evidence of right heart strain (RV/LV Ratio = 1.41) consistent with at least submassive (intermediate risk) PE. The presence of right heart strain has been associated with an increased risk of morbidity and mortality. Please refer to the "PE Focused" order set in EPIC. Patient was transferred to [facility] where IR performed a PE thrombectomy on 5.26.22 with purse-string suture removed on 5.17.22. Team is requesting a IVC for prophylactic purposes. Patient has a RIJ central line that was placed by IR at the time of the thrombectomy.  Potassium 3.0. Albumin 1.8, Hgb 7.5. Patient is on a heparin gtt.All other labs and medications are within acceptable parameters. NKDA.  IR consulted for possible IVC filter placement. Case has been reviewed and procedure approved by Dr. Serafina Royals.  Patient tentatively scheduled for 5.29.22.  Team instructed to: Keep Patient to be NPO after midnight  IR will call patient when ready.  Risks and benefits discussed with the patient including, but not limited to bleeding, infection, contrast induced renal failure, filter fracture or migration which can lead to emergency surgery or even death, strut penetration with  damage or irritation to adjacent structures and caval thrombosis. All of the patient's questions were answered, patient is agreeable to proceed. Consent signed and in chart.  Thank you for this interesting consult.  I greatly enjoyed meeting Ameisha Mcclellan and look forward to participating in their care.  A copy of this report was sent to the requesting provider on this date.  Electronically Signed: Jacqualine Mau, NP 09/11/2020, 1:30 PM   I spent a total of 40 Minutes  in face to face in clinical consultation, greater than 50% of which was counseling/coordinating care for IVC filter placement

## 2020-09-11 NOTE — Progress Notes (Signed)
NAME:  Shanoah Asbill, MRN:  [id number], DOB:  Nov 19, 1965, LOS: 3 ADMISSION DATE:  09/08/2020, CONSULTATION DATE:  5/26 REFERRING MD:  Dr. Nevada Crane, CHIEF COMPLAINT:  PE  History of Present Illness:  55 year old female with PMH as below, which is significant for gastric cancer managed by Dr. Benay Spice. She has been undergoing chemotherapy continuously since 2019 with last treatment on 4/5. She underwent endoscopy 5/12 which, despite chemotherapy, showed the entire gastric mucosa to have been replaced by malignancy. She was seen in the oncology office 5/18 and she was referred to a tertiary center. Then 5/24 she presented to [facility] ED with complaints of near syncope. She became diaphoretic and dyspneic while using the restroom and briefly slumped over the sink.   In the ED she complained of progressive dyspnea for about one month prior, which grew significantly worse in the days leading to her presentation. Upon arrival to the emergency department she was oxygenating well, was tachycardic, and had borderline hypotension. laboratory evaluation was significant for a 2 gram drop in her hemoglobin from one week prior. She was admitted to the hospitalist service for symptomatic anemia presumably from GI bleeding, and transfusion was initiated. GI was involved and did not recommend any further intervention endoscopically. As part of her dyspnea workup, a V/Q scan showed multiple bilateral perfusion defects consistent with pulmonary embolism. Echocardiogram consistent with acute cor pulmonale. PCCM was consulted for recommendations regarding PE.   Pertinent  Medical History   has a past medical history of Anemia, Arthritis, Breast CA ([place]) (dx'd 2007), Carpal tunnel syndrome on both sides, DVT (deep venous thrombosis) ([place]), Dyspnea, Fibromyalgia, History of blood transfusion (08/17/2020), Lupus ([place]), Pneumonia, Raynaud disease, and stomach/colon ca (dx'd 2020).   Significant Hospital Events: Including  procedures, antibiotic start and stop dates in addition to other pertinent events   . 5/25 admit for syncope/symptomatic anemia . 5/26 PE diagnosed, heparin started Tx to ICU. PCCM consulted.History of DVT. Syncope at home. PE diagnosed on VQ 5/16. High risk by PESI score 133. Acute cor pulmonale described on echocardiogram. To [facility] for thrombectomy per IR . 5/27 On RA had erroneous hgb recheck nml. No intervention needed . 5/28 aline removed. Out of ICU   Interim History / Subjective:  No complaint   Objective   Blood pressure 109/74, pulse (Abnormal) 107, temperature 98.3 F (36.8 C), temperature source Oral, resp. rate 16, weight 74.6 kg, SpO2 100 %.        Intake/Output Summary (Last 24 hours) at 09/11/2020 0916 Last data filed at 09/11/2020 0800 Gross per 24 hour  Intake 892.52 ml  Output 870 ml  Net 22.52 ml   Filed Weights   09/08/20 1526  Weight: 74.6 kg    Examination:  General Pleasant 55 year old female resting in bed no acute distress HEENT normocephalic atraumatic no jugular venous distention right IJ triple-lumen catheter unremarkable Pulmonary: Clear to auscultation no accessory use currently room air. Card rrr abd soft not tender  Ext warm and dry  Neuro intact   Labs/imaging that I havepersonally reviewed  (right click and "Reselect all SmartList Selections" daily)   See below   Resolved Hospital Problem list    Syncope 2/2 massive PE and symptomatic anemia  Assessment & Plan:   Acute GIB w/ associated acute blood loss anemia 2/2 Gastric Cancer: . Endoscopy earlier this month shows near complete replacement of gastric mucosa by malignancy causing spontaneous bleeding. Followed by Dr. Benay Spice who has been on chemotherapy since 2019 Patient  was hoping to seek second opinion at [facility].  Plan Cont PPI BID and carafate tid Close obs w/ IV heparin  Soft diet (low residue) marinol PRN oxy  Serial cbcs  Acute massive Pulmonary Embolism s/p thrombectomy by  IR  DVT  Plan Cont IV heparin  Would consider IVC filter if we have to stop AC  Fluid and electrolyte imbalance: hypokalemia and mild hyponatremia Plan Replace and recheck   Elevated troponin Likely represents demand ischemia due to anemia/PE -appreciate Cardiology-not a candidate for DAPT  Plan Tele    Thrombocytopenia Secondary to gastric cancer-->improved  Plan Monitor   Goals of care -appreciate Palliative Care    -patient wants full scope therapy for now  Plan Full code.   Best practice (right click and "Reselect all SmartList Selections" daily)  Diet:  Oral Pain/Anxiety/Delirium protocol (if indicated): No VAP protocol (if indicated): Not indicated DVT prophylaxis: Systemic AC GI prophylaxis: PPI Glucose control:  SSI No Central venous access:  Yes, and it is still needed Arterial line:  Yes, and it is no longer needed Foley:  N/A Mobility:  OOB  PT consulted: N/A Last date of multidisciplinary goals of care discussion: ongoing  Code Status:  full code Disposition: ICU, prognosis guarded.     Critical care time: NA    Erick Colace ACNP-BC [place] Pager # [phone] OR # [phone] if no answer

## 2020-09-11 NOTE — Progress Notes (Signed)
ANTICOAGULATION CONSULT NOTE - Follow Up Consult  Pharmacy Consult for heparin Indication: pulmonary embolus  Labs: Recent Labs    09/09/20 0330 09/09/20 1200 09/09/20 1818 09/09/20 1908 09/09/20 2217 09/09/20 2221 09/10/20 0049 09/10/20 0057 09/10/20 0400 09/10/20 0500 09/10/20 0938 09/10/20 1110 09/10/20 1221 09/10/20 1626 09/10/20 2100 09/11/20 0500 09/11/20 0530 09/11/20 1605  HGB 7.5*   < >  --    < >  --   --   --    < > 9.0*  --    < > 6.3*   < > 8.1* 8.0* 7.5*  --   --   HCT 24.4*   < >  --    < >  --   --   --    < > 28.1*  --    < > 19.6*   < > 26.1* 25.1* 23.7*  --   --   PLT 133*  --   --    < >  --   --   --    < > 127*  --   --  96*  --   --   --  138*  --   --   APTT  --   --   --   --  >200*  --  199*  --   --  47*  --   --   --   --   --   --   --   --   LABPROT  --   --  13.6  --   --   --   --   --   --   --   --   --   --   --   --   --   --   --   INR  --   --  1.0  --   --   --   --   --   --   --   --   --   --   --   --   --   --   --   HEPARINUNFRC  --   --  0.40  --   --    < > 0.70  --   --  <0.10*  --  <0.10*  --   --  0.89*  --  0.99* 0.71*  CREATININE 0.36*  --   --   --   --   --   --   --   --   --   --   --   --   --   --  0.59  --   --    < > = values in this interval not displayed.    Assessment: 55 yo female presented on 09/07/2020 for near syncope. Patient found to have PE with RHS now s/p thrombectomy. Patient with PHM of gastric cancer. Patient also with PMH of DVT and recent EGD for hematemesis due to apixaban use which was stopped.   S/p thrombectomy on 09/09/2020 for PE. Heparin held after due to high ACT and resumed this morning based on aPTT.  Heparin level came back supratherapeutic at 0.71, on 800 units/hr. Heparin is infusing in IJ and level drawn from central line after paused and flushed by nursing (no peripheral access to infuse heparin through). Hgb 7.5, plt 138. No s/sx of bleeding or infusion issues per nursing. Has had  hematuria which was reported PTA also - stable.  Goal of Therapy:  Heparin level 0.3 - 0.5 unit/mL; No bolus Monitor platelets by anticoagulation protocol: Yes   Plan:  Reduce heparin to 700 units/hr Check 6 hr heparin level and H&H Monitor heparin level, CBC, and s/s of bleeding daily  Monitor bleeding closely - very high risk of bleeding   Antonietta Jewel, PharmD, [place] Clinical Pharmacist  Phone: [phone] 09/11/2020 5:05 PM  Please check AMION for all [place] phone numbers After 10:00 PM, call [place] [phone]

## 2020-09-11 NOTE — Progress Notes (Signed)
ANTICOAGULATION CONSULT NOTE - Follow Up Consult  Pharmacy Consult for heparin Indication: pulmonary embolus and LLE + RUE DVTs  Labs: Recent Labs    09/09/20 0330 09/09/20 1200 09/09/20 1818 09/09/20 1908 09/09/20 2217 09/09/20 2221 09/10/20 0049 09/10/20 0057 09/10/20 0400 09/10/20 0500 09/10/20 0938 09/10/20 1110 09/10/20 1221 09/10/20 1626 09/10/20 2100 09/11/20 0500 09/11/20 0530 09/11/20 1605 09/11/20 2306  HGB 7.5*   < >  --    < >  --   --   --    < > 9.0*  --    < > 6.3*   < > 8.1* 8.0* 7.5*  --   --   --   HCT 24.4*   < >  --    < >  --   --   --    < > 28.1*  --    < > 19.6*   < > 26.1* 25.1* 23.7*  --   --   --   PLT 133*  --   --    < >  --   --   --    < > 127*  --   --  96*  --   --   --  138*  --   --   --   APTT  --   --   --   --  >200*  --  199*  --   --  47*  --   --   --   --   --   --   --   --   --   LABPROT  --   --  13.6  --   --   --   --   --   --   --   --   --   --   --   --   --   --   --   --   INR  --   --  1.0  --   --   --   --   --   --   --   --   --   --   --   --   --   --   --   --   HEPARINUNFRC  --   --  0.40  --   --    < > 0.70  --   --  <0.10*  --  <0.10*  --   --  0.89*  --  0.99* 0.71* >1.10*  CREATININE 0.36*  --   --   --   --   --   --   --   --   --   --   --   --   --   --  0.59  --   --   --    < > = values in this interval not displayed.    Assessment: 55yo female remains supratherapeutic on heparin with higher lever despite rate decrease; no gtt issues or signs of bleeding per RN.  Goal of Therapy:  Heparin level 0.3-0.5 units/ml   Plan:  Will hold heparin gtt x1h then decrease heparin gtt by 3 units/kg/hr to 500 units/hr and check level in 6 hours.      Wynona Neat, PharmD, BCPS  09/11/2020,11:59 PM

## 2020-09-11 NOTE — Progress Notes (Signed)
Daily Progress Note   Patient Name: Michelle Cain       Date: 09/11/2020 DOB: Apr 14, 1966  Age: 55 y.o. MRN#: [id number] Attending Physician: Margaretha Seeds, MD Primary Care Physician: Laurette Schimke, DO Admit Date: 09/08/2020  Reason for Consultation/Follow-up: Establishing goals of care  Subjective:  resting comfortably in bed, mother and brother at bedside. Patient able to speak in full sentences, on room air, denies pain or other complaints, see below.   Length of Stay: 3  Current Medications: Scheduled Meds:  . sodium chloride   Intravenous Once  . chlorhexidine  15 mL Mouth Rinse BID  . Chlorhexidine Gluconate Cloth  6 each Topical Daily  . dronabinol  2.5 mg Oral BID  . feeding supplement  1 Container Oral TID BM  . mouth rinse  15 mL Mouth Rinse q12n4p  . pantoprazole (PROTONIX) IV  40 mg Intravenous Q12H  . potassium chloride  40 mEq Oral BID  . sodium chloride flush  10-40 mL Intracatheter Q12H  . sucralfate  1 g Oral TID WC & HS    Continuous Infusions: . heparin 800 Units/hr (09/11/20 1400)    PRN Meds: acetaminophen **OR** acetaminophen, ondansetron (ZOFRAN) IV, oxyCODONE, promethazine, sodium chloride flush  Physical Exam         Awake alert No distress Resting in bed Regular work of breathing Regular Abdomen not distended No focal deficits Has LE edema  Vital Signs: BP 121/65 (BP Location: Left Leg)   Pulse 100   Temp 98 F (36.7 C) (Oral)   Resp (!) 21   Wt 74.6 kg   SpO2 100%   BMI 28.23 kg/m  SpO2: SpO2: 100 % O2 Device: O2 Device: Room Air O2 Flow Rate:    Intake/output summary:   Intake/Output Summary (Last 24 hours) at 09/11/2020 1451 Last data filed at 09/11/2020 1400 Gross per 24 hour  Intake 696.01 ml  Output 420 ml   Net 276.01 ml   LBM: Last BM Date: 09/07/20 Baseline Weight: Weight: 74.6 kg Most recent weight: Weight: 74.6 kg       Palliative Assessment/Data: PPS 50%     Patient Active Problem List   Diagnosis Date Noted  . Palliative care by specialist   . General weakness   . Acute massive pulmonary embolism ([place])   . Demand  ischemia ([place])   . Symptomatic anemia 09/08/2020  . Hypokalemia 05/12/2020  . Hyponatremia 05/12/2020  . CAP (community acquired pneumonia) 05/11/2020  . Port-A-Cath in place 10/22/2019  . Genetic testing 09/19/2019  . Malignant neoplasm of stomach ([place]) 08/26/2019  . Goals of care, counseling/discussion 08/26/2019  . Plantar fasciitis 07/31/2012  . Right knee pain 05/28/2012    Palliative Care Assessment & Plan   Patient Profile:    Assessment:  Acute blood loss anemia secondary to GIB in setting of gastric cancer.  Acute massive pulmonary embolism s/p thrombectomy 5/26   Recommendations/Plan: Remains full code full scope care, recognizes high risk for bleeding, is open to any and all offered life maintaining life prolonging interventions, is thankful for her care and for information provided by all of her team members.  PMT will follow peripherally.    Code Status:    Code Status Orders  (From admission, onward)         Start     Ordered   09/08/20 1551  Full code  Continuous        09/08/20 1558        Code Status History    Date Active Date Inactive Code Status Order ID Comments User Context   05/12/2020 2142 05/13/2020 2124 Full Code [id number]  Elwyn Reach, MD Inpatient   Advance Care Planning Activity       Prognosis:   Unable to determine  Discharge Planning:  To Be Determined  Care plan was discussed with  Patient, mother and brother at bedside.   Thank you for allowing the Palliative Medicine Team to assist in the care of this patient.   Time In: 1400 Time Out: 1425 Total Time 25 Prolonged Time Billed No        Greater than 50%  of this time was spent counseling and coordinating care related to the above assessment and plan.  Loistine Chance, MD  Please contact Palliative Medicine Team phone at [phone] for questions and concerns.

## 2020-09-12 ENCOUNTER — Inpatient Hospital Stay (HOSPITAL_COMMUNITY): Payer: Medicare Other

## 2020-09-12 DIAGNOSIS — I248 Other forms of acute ischemic heart disease: Secondary | ICD-10-CM | POA: Diagnosis not present

## 2020-09-12 DIAGNOSIS — K922 Gastrointestinal hemorrhage, unspecified: Secondary | ICD-10-CM

## 2020-09-12 DIAGNOSIS — D62 Acute posthemorrhagic anemia: Secondary | ICD-10-CM | POA: Diagnosis not present

## 2020-09-12 DIAGNOSIS — I2699 Other pulmonary embolism without acute cor pulmonale: Secondary | ICD-10-CM | POA: Diagnosis not present

## 2020-09-12 HISTORY — PX: IR IVC FILTER PLMT / S&I /IMG GUID/MOD SED: IMG701

## 2020-09-12 LAB — TYPE AND SCREEN
ABO/RH(D): A POS
Antibody Screen: NEGATIVE
Unit division: 0
Unit division: 0
Unit division: 0

## 2020-09-12 LAB — CBC
HCT: 22.4 % — ABNORMAL LOW (ref 36.0–46.0)
HCT: 19.8 % — ABNORMAL LOW (ref 36.0–46.0)
Hemoglobin: 7 g/dL — ABNORMAL LOW (ref 12.0–15.0)
Hemoglobin: 6 g/dL — CL (ref 12.0–15.0)
MCH: 29.4 pg (ref 26.0–34.0)
MCH: 29.1 pg (ref 26.0–34.0)
MCHC: 31.3 g/dL (ref 30.0–36.0)
MCHC: 30.3 g/dL (ref 30.0–36.0)
MCV: 94.1 fL (ref 80.0–100.0)
MCV: 96.1 fL (ref 80.0–100.0)
Platelets: 126 10*3/uL — ABNORMAL LOW (ref 150–400)
Platelets: 135 10*3/uL — ABNORMAL LOW (ref 150–400)
RBC: 2.38 MIL/uL — ABNORMAL LOW (ref 3.87–5.11)
RBC: 2.06 MIL/uL — ABNORMAL LOW (ref 3.87–5.11)
RDW: 19.7 % — ABNORMAL HIGH (ref 11.5–15.5)
RDW: 19.8 % — ABNORMAL HIGH (ref 11.5–15.5)
WBC: 7.4 10*3/uL (ref 4.0–10.5)
WBC: 6.5 10*3/uL (ref 4.0–10.5)
nRBC: 4.1 % — ABNORMAL HIGH (ref 0.0–0.2)
nRBC: 5.1 % — ABNORMAL HIGH (ref 0.0–0.2)

## 2020-09-12 LAB — BPAM RBC
Blood Product Expiration Date: 202206242359
Blood Product Expiration Date: 202206242359
Blood Product Expiration Date: 202206242359
ISSUE DATE / TIME: 202205251722
ISSUE DATE / TIME: 202205261350
Unit Type and Rh: 6200
Unit Type and Rh: 6200
Unit Type and Rh: 6200

## 2020-09-12 LAB — COMPREHENSIVE METABOLIC PANEL
ALT: 8 U/L (ref 0–44)
AST: 16 U/L (ref 15–41)
Albumin: 1.9 g/dL — ABNORMAL LOW (ref 3.5–5.0)
Alkaline Phosphatase: 63 U/L (ref 38–126)
Anion gap: 3 — ABNORMAL LOW (ref 5–15)
BUN: 10 mg/dL (ref 6–20)
CO2: 25 mmol/L (ref 22–32)
Calcium: 7.4 mg/dL — ABNORMAL LOW (ref 8.9–10.3)
Chloride: 108 mmol/L (ref 98–111)
Creatinine, Ser: 0.57 mg/dL (ref 0.44–1.00)
GFR, Estimated: >60 mL/min (ref >60)
Glucose, Bld: 97 mg/dL (ref 70–99)
Potassium: 4 mmol/L (ref 3.5–5.1)
Sodium: 136 mmol/L (ref 135–145)
Total Bilirubin: 0.6 mg/dL (ref 0.3–1.2)
Total Protein: 4.7 g/dL — ABNORMAL LOW (ref 6.5–8.1)

## 2020-09-12 LAB — HEPARIN LEVEL (UNFRACTIONATED): Heparin Unfractionated: 0.29 IU/mL — ABNORMAL LOW (ref 0.30–0.70)

## 2020-09-12 LAB — PREPARE RBC (CROSSMATCH)

## 2020-09-12 MED ORDER — HEPARIN (PORCINE) 25000 UT/250ML-% IV SOLN
600.0000 [IU]/h | INTRAVENOUS | Status: DC
  Filled 2020-09-12: qty 250

## 2020-09-12 MED ORDER — DIPHENHYDRAMINE HCL 50 MG/ML IJ SOLN
25.0000 mg | Freq: Once | INTRAMUSCULAR | Status: AC
  Administered 2020-09-12: 25 mg via INTRAVENOUS
  Filled 2020-09-12: qty 1

## 2020-09-12 MED ORDER — FENTANYL CITRATE (PF) 100 MCG/2ML IJ SOLN
INTRAMUSCULAR | Status: AC
  Filled 2020-09-12: qty 2

## 2020-09-12 MED ORDER — FUROSEMIDE 10 MG/ML IJ SOLN
40.0000 mg | Freq: Once | INTRAMUSCULAR | Status: AC
  Administered 2020-09-12: 40 mg via INTRAVENOUS
  Filled 2020-09-12: qty 4

## 2020-09-12 MED ORDER — MIDAZOLAM HCL 2 MG/2ML IJ SOLN
INTRAMUSCULAR | Status: AC
  Filled 2020-09-12: qty 2

## 2020-09-12 MED ORDER — ACETAMINOPHEN 325 MG PO TABS
650.0000 mg | ORAL_TABLET | Freq: Once | ORAL | Status: AC
  Administered 2020-09-12: 650 mg via ORAL
  Filled 2020-09-12: qty 2

## 2020-09-12 MED ORDER — MIDAZOLAM HCL 2 MG/2ML IJ SOLN
INTRAMUSCULAR | Status: AC | PRN
  Administered 2020-09-12 (×2): 1 mg via INTRAVENOUS

## 2020-09-12 MED ORDER — SODIUM CHLORIDE 0.9% IV SOLUTION: Freq: Once | INTRAVENOUS | Status: AC

## 2020-09-12 MED ORDER — LIDOCAINE HCL 1 % IJ SOLN
INTRAMUSCULAR | Status: AC | PRN
  Administered 2020-09-12: 5 mL

## 2020-09-12 MED ORDER — FENTANYL CITRATE (PF) 100 MCG/2ML IJ SOLN
INTRAMUSCULAR | Status: AC | PRN
  Administered 2020-09-12 (×2): 50 ug via INTRAVENOUS

## 2020-09-12 NOTE — Progress Notes (Signed)
ANTICOAGULATION CONSULT NOTE - Follow Up Consult  Pharmacy Consult for heparin Indication: pulmonary embolus  Patient Measurements: Weight: 80.2 kg (176 lb 12.9 oz) Heparin Dosing Weight: 71 kg  Labs: Recent Labs    09/09/20 1818 09/09/20 1908 09/09/20 2217 09/09/20 2221 09/10/20 0049 09/10/20 0057 09/10/20 0500 09/10/20 0938 09/10/20 1110 09/10/20 1221 09/10/20 2100 09/11/20 0500 09/11/20 0530 09/11/20 1605 09/11/20 2306 09/12/20 0731  HGB  --    < >  --   --   --    < >  --    < > 6.3*   < > 8.0* 7.5*  --   --   --  7.0*  HCT  --    < >  --   --   --    < >  --    < > 19.6*   < > 25.1* 23.7*  --   --   --  22.4*  PLT  --    < >  --   --   --    < >  --   --  96*  --   --  138*  --   --   --  126*  APTT  --   --  >200*  --  199*  --  47*  --   --   --   --   --   --   --   --   --   LABPROT 13.6  --   --   --   --   --   --   --   --   --   --   --   --   --   --   --   INR 1.0  --   --   --   --   --   --   --   --   --   --   --   --   --   --   --   HEPARINUNFRC 0.40  --   --    < > 0.70  --  <0.10*  --  <0.10*  --  0.89*  --    < > 0.71* >1.10* 0.29*  CREATININE  --   --   --   --   --   --   --   --   --   --   --  0.59  --   --   --   --    < > = values in this interval not displayed.    Assessment: 55 yo female presented on 09/07/2020 for near syncope. Patient found to have PE with RHS now s/p thrombectomy. Patient with PHM of gastric cancer. Patient also with PMH of DVT and recent EGD for hematemesis due to apixaban use which was stopped.   S/p thrombectomy on 5/26 for PE and now s/p IVC 5/29.  Heparin level is now slightly subtherapeutic at 0.29 on 500 units/hr after holding infusion and decreasing rate. Level drawn via peripheral stick by phlebotomy. Hgb 7, plt 126. Per RN, pt had an episode of hematemesis when returning from IR. Spoke with MD who wants to continue heparin at current rate. Repeating H&H.   Goal of Therapy:  Heparin level 0.3 - 0.5 unit/mL; No  bolus Monitor platelets by anticoagulation protocol: Yes   Plan:  Heparin 500 units/hr Check 6 hr heparin level and H&H Monitor heparin level, CBC, and s/s of bleeding daily  Monitor bleeding closely - very high risk of bleeding   Romilda Garret, PharmD PGY1 Acute Care Pharmacy Resident 09/12/2020 8:53 AM  Please check [email or web address] for unit specific pharmacy phone numbers.

## 2020-09-12 NOTE — Progress Notes (Signed)
Patient arrived to the floor via the bed, with the nurses. Pt alert and oriented, VSS, CHG bath given cardiac monitoring initiated, we'll continue to monitor.

## 2020-09-12 NOTE — Progress Notes (Signed)
Pt is picked up by transport with the IV pole and heparin running at 5 ml /hr. Talk to IR staff about the Heparin level due to be drawn at 0645.

## 2020-09-12 NOTE — Progress Notes (Signed)
Pt came back from IR services, attempted to draw the heparin level with no blood return from the port. Phlebotomy notified.

## 2020-09-12 NOTE — Procedures (Signed)
Pre procedural Dx: DVT with contraindication to anti-coagulation Post Procedural Dx: Same  Successful placement of an infrarenal IVC filter via the right CFV. Keep right leg straight x 2 hrs (until 0900)  EBL: Trace Complications: None immediate  Ronny Bacon, MD Pager #: [phone]

## 2020-09-12 NOTE — Progress Notes (Signed)
PROGRESS NOTE        PATIENT DETAILS Name: Michelle Cain Age: 55 y.o. Sex: female Date of Birth: 09-03-1965 Admit Date: 09/08/2020 Admitting Physician Jonnie Finner, DO ZOX:[id number], [name], DO  Brief Narrative: Patient is a 55 y.o. female with gastric cancer-on chemotherapy as an outpatient-(last EGD on 5/12 showed entire gastric mucosa replaced by malignancy)-history of lower extremity DVT-Eliquis on hold since EGD on 5/12-presented to the hospital on 5/25 with a syncopal episode and acute blood loss anemia due to low-grade GI bleeding from known gastric cancer.   Further work-up revealed massive PE with acute cor pulmonale-requiring transfer to the ICU-and mechanical thrombectomy.  See below for further details.  Significant events: 5/25>> admit to [facility] for syncope-symptomatic anemia-having melanotic stools. 5/26>> diagnosed with massive PE-acute cor pulmonale on echo-to [facility] for thrombectomy 5/27>> bilateral pulmonary artery thrombectomy by IR 5/29>> infra renal IVC filter placed by IR 5/29>> transferred to [facility] from ICU  Significant studies: 5/12>> EGD: Gastric mucosa completely replaced by cancer-tumor ulcerated in areas-with spontaneous oozing 5/26>> VQ scan: Consistent with PE 5/26>> CTA chest: Acute PE with CT evidence of right heart strain 5/26>> Echo EF 98-11%, RV systolic function moderately reduced 5/26>> bilateral lower extremity Doppler: DVT in left popliteal/posterior tibial/peroneal vein 5/27>> bilateral upper extremity DVT: DVT in right subclavian and axillary vein  Antimicrobial therapy: Cefepime: 5/25>> 5/26 Vancomycin: 5/25 x 1  Microbiology data: 5/25>> blood culture: No growth 5/25>> COVID PCR: Negative  Procedures : 5/27>> bilateral pulmonary artery thrombectomy by IR 5/29>> infra renal IVC filter placed by IR  Consults: Cardiology, PCCM, IR, oncology, palliative care  DVT Prophylaxis : SCDs Start: 09/08/20 1550 On IV  heparin   Subjective: Lying comfortably in bed having no BM for the past several days.  Had a very small volume of hematemesis this morning-Per patient-this is not unusual for her.  Mother/brother at bedside.   Assessment/Plan: Acute massive pulmonary embolism-s/p thrombectomy by IR on 5/27: Remains on IV heparin-unfortunately appears to have low-grade GI bleeding from underlying gastric cancer.  Hemoglobin dropping-watch closely-maintain heparin levels at lower therapeutic range-if she has more overt GI bleeding-will need to stop anticoagulation.  However-in the unlikely event that she tolerates IV heparin well without any overt GI bleeding-she will need to be switched to SQ Lovenox.  History of left lower extremity DVT-s/p IVC filter on 5/29: Previously on anticoagulation-Per chart review-this was discontinued following EGD findings on 5/12.  Currently on IV heparin-plan is to watch closely-if low-grade GI bleeding becomes overt/severe-we will need to stop anticoagulation.  Right subclavian/axillary vein and DVT: Difficult situation-currently on IV heparin.  Discussed with vascular surgery-Dr. Carlis Abbott on 5/29-no good options for surgical treatment-not a candidate for lytic therapy given EGD findings-does not think vena cava filter in the SVC is possible.  Recommends compression and elevation of affected extremity.  Low-grade GI bleeding with acute blood loss anemia due to gastric cancer: EGD findings on 5/12 reviewed-GI input reviewed-very difficult situation-especially given significant clot burden and need for anticoagulation.  If GI bleeding worsens/becomes more overt-we will need to stop anticoagulation.  Discussed at length with patient/brother/mother at bedside this morning-all understand the delicate/tenuous nature of the situation-no good treatment options-all agree to continue with anticoagulation with close monitoring.  Remains on Carafate-PPI twice daily-plan is to repeat CBC in a few  hours.  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  Per oncology-this is not a curative situation-oncology is in the process of trying to get a second opinion with GI oncology at [facility]  History ofLeft breast cancer 2008,pT1c,pN0, status post a left lumpectomy with adjuvant chemotherapy and radiation, ER positive, PR positive, HER-2 positive  Minimally elevated troponin: Secondary to demand ischemia/PE-not ACS-not a candidate for any further work-up given advanced malignancy.  Thrombocytopenia: Improving slowly-probably reflective of consumptive thrombocytopenia in the setting of significant VTE.  Palliative care: Has metastatic gastric cancer with significant tumor burden in spite of being on chemo-with ongoing low-grade GI bleeding-now complicated by VTE.  Really no good treatment options-palliative care following-not yet ready for DNR status/comfort measures per palliative care MD.  Long discussion with patient/family at bedside this morning-as noted above-all understand tenuous clinical situation-potential for worsening of low-grade GI bleeding while on anticoagulation.  Patient and family-at this time elected to cautiously continue with anticoagulation and hope that she could make it out of the hospital to get a second opinion at [facility].  Touched base with palliative care MD Dr Rowe Pavy 5/29-she will continue to engage with patient/family as well.   Obesity: Estimated body mass index is 30.35 kg/m as calculated from the following:   Height as of this encounter: 5' 4" (1.626 m).   Weight as of this encounter: 80.2 kg.     Diet: Diet Order            DIET SOFT Room service appropriate? Yes; Fluid consistency: Thin  Diet effective now                  Code Status: Full code   Family Communication: Mother/Brother at bedside  Disposition Plan: Status is:  Inpatient  Remains inpatient appropriate because:Inpatient level of care appropriate due to severity of illness   Dispo:  Patient From: Home  Planned Disposition: To be determined  Medically stable for discharge: No      Barriers to Discharge: Low-grade GI bleeding-VTE-on IV heparin-see above discussion  Antimicrobial agents: Anti-infectives (From admission, onward)   Start     Dose/Rate Route Frequency Ordered Stop   09/09/20 2200  vancomycin (VANCOREADY) IVPB 1500 mg/300 mL  Status:  Discontinued        1,500 mg 150 mL/hr over 120 Minutes Intravenous Every 24 hours 09/08/20 1720 09/09/20 1241   09/08/20 2000  ceFEPIme (MAXIPIME) 2 g in sodium chloride 0.9 % 100 mL IVPB  Status:  Discontinued        2 g 200 mL/hr over 30 Minutes Intravenous Every 8 hours 09/08/20 1705 09/09/20 1241   09/08/20 2000  vancomycin (VANCOREADY) IVPB 1500 mg/300 mL        1,500 mg 150 mL/hr over 120 Minutes Intravenous  Once 09/08/20 1716 09/09/20 0105       Time spent: 35 minutes-Greater than 50% of this time was spent in counseling, explanation of diagnosis, planning of further management, and coordination of care.  MEDICATIONS: Scheduled Meds: . sodium chloride   Intravenous Once  . chlorhexidine  15 mL Mouth Rinse BID  . Chlorhexidine Gluconate Cloth  6 each Topical Daily  . dronabinol  2.5 mg Oral BID  . feeding supplement  1 Container Oral TID BM  . fentaNYL      . mouth rinse  15 mL Mouth Rinse q12n4p  . midazolam      . pantoprazole (PROTONIX) IV  40 mg Intravenous Q12H  .  sodium chloride flush  10-40 mL Intracatheter Q12H  . sucralfate  1 g Oral TID WC & HS   Continuous Infusions: . heparin 500 Units/hr (09/12/20 0144)   PRN Meds:.acetaminophen **OR** acetaminophen, ondansetron (ZOFRAN) IV, oxyCODONE, promethazine, sodium chloride flush   PHYSICAL EXAM: Vital signs: Vitals:   09/12/20 0442 09/12/20 0620 09/12/20 0635 09/12/20 0640  BP: 124/68 109/78 (!) 103/51 116/67   Pulse: (!) 107 (!) 106 (!) 105 (!) 106  Resp: _0 Temp: 100.1 F (37.8 C)     TempSrc: Oral     SpO2: 100% 100% 100% 100%  Weight: 80.2 kg     Height: 5' 4" (1.626 m)      Filed Weights   09/08/20 1526 09/12/20 0442  Weight: 74.6 kg 80.2 kg   Body mass index is 30.35 kg/m.   Gen Exam:Alert awake-not in any distress HEENT:atraumatic, normocephalic Chest: B/L clear to auscultation anteriorly CVS:S1S2 regular Abdomen:soft non tender, non distended Extremities:no edema Neurology: Non focal Skin: no rash  I have personally reviewed following labs and imaging studies  LABORATORY DATA: CBC: Recent Labs  Lab 09/08/20 1213 09/09/20 0330 09/09/20 1908 09/09/20 2137 09/10/20 0057 09/10/20 0400 09/10/20 0938 09/10/20 1110 09/10/20 1221 09/10/20 1626 09/10/20 2100 09/11/20 0500 09/12/20 0731  WBC 12.1*   < > 9.3 8.2 9.0 8.0  --  6.6  --   --   --  7.0 7.4  NEUTROABS 9.7*  --  7.3 6.4  --   --   --   --   --   --   --   --   --   HGB 7.3*   < > 10.0* 8.8* 9.2* 9.0*   < > 6.3* 8.1* 8.1* 8.0* 7.5* 7.0*  HCT 25.2*   < > 31.6* 27.0* 28.4* 28.1*   < > 19.6* 25.8* 26.1* 25.1* 23.7* 22.4*  MCV 93.3   < > 92.4 90.0 91.0 90.6  --  92.9  --   --   --  92.6 94.1  PLT 184   < > 124* 121* 124* 127*  --  96*  --   --   --  138* 126*   < > = values in this interval not displayed.    Basic Metabolic Panel: Recent Labs  Lab 09/08/20 1213 09/08/20 2133 09/09/20 0330 09/11/20 0500 09/12/20 0731  NA 135  --  136 134* 136  K 3.9  --  3.9 3.0* 4.0  CL 103  --  107 107 108  CO2 22  --  20* 23 25  GLUCOSE 185*  --  96 124* 97  BUN 17  --  _1 CREATININE 0.76  --  0.36* 0.59 0.57  CALCIUM 8.4*  --  7.6* 7.4* 7.4*  MG  --  1.8  --   --   --     GFR: Estimated Creatinine Clearance: 83.3 mL/min (by C-G formula based on SCr of 0.57 mg/dL).  Liver Function Tests: Recent Labs  Lab 09/08/20 1213 09/09/20 0330 09/11/20 0500 09/12/20 0731  AST _2 ALT _3 ALKPHOS 101 79 60 63  BILITOT 0.2* 0.9 0.6 0.6  PROT 6.7 5.5* 4.5* 4.7*  ALBUMIN 2.9* 2.4* 1.8* 1.9*   Recent Labs  Lab 09/08/20 1213  LIPASE 17   No results for input(s): AMMONIA in the last 168 hours.  Coagulation Profile: Recent Labs  Lab 09/09/20 1818  INR 1.0  Cardiac Enzymes: No results for input(s): CKTOTAL, CKMB, CKMBINDEX, TROPONINI in the last 168 hours.  BNP (last 3 results) No results for input(s): PROBNP in the last 8760 hours.  Lipid Profile: No results for input(s): CHOL, HDL, LDLCALC, TRIG, CHOLHDL, LDLDIRECT in the last 72 hours.  Thyroid Function Tests: No results for input(s): TSH, T4TOTAL, FREET4, T3FREE, THYROIDAB in the last 72 hours.  Anemia Panel: No results for input(s): VITAMINB12, FOLATE, FERRITIN, TIBC, IRON, RETICCTPCT in the last 72 hours.  Urine analysis:    Component Value Date/Time   COLORURINE AMBER (A) 10/22/2019 1000   APPEARANCEUR CLOUDY (A) 10/22/2019 1000   LABSPEC 1.024 10/22/2019 1000   PHURINE 5.0 10/22/2019 1000   GLUCOSEU NEGATIVE 10/22/2019 1000   HGBUR LARGE (A) 10/22/2019 1000   BILIRUBINUR NEGATIVE 10/22/2019 1000   KETONESUR NEGATIVE 10/22/2019 1000   PROTEINUR 100 (A) 03/02/2020 0950   NITRITE NEGATIVE 10/22/2019 1000   LEUKOCYTESUR SMALL (A) 10/22/2019 1000    Sepsis Labs: Lactic Acid, Venous    Component Value Date/Time   LATICACIDVEN 1.3 09/08/2020 2133    MICROBIOLOGY: Recent Results (from the past 240 hour(s))  SARS CORONAVIRUS 2 (TAT 6-24 HRS) Nasopharyngeal Nasopharyngeal Swab     Status: None   Collection Time: 09/08/20 12:46 PM   Specimen: Nasopharyngeal Swab  Result Value Ref Range Status   SARS Coronavirus 2 NEGATIVE NEGATIVE Final    Comment: (NOTE) SARS-CoV-2 target nucleic acids are NOT DETECTED.  The SARS-CoV-2 RNA is generally detectable in upper and lower respiratory specimens during the acute phase of infection. Negative results do not preclude SARS-CoV-2 infection, do not  rule out co-infections with other pathogens, and should not be used as the sole basis for treatment or other patient management decisions. Negative results must be combined with clinical observations, patient history, and epidemiological information. The expected result is Negative.  Fact Sheet for Patients: SugarRoll.be  Fact Sheet for Healthcare Providers: https://www.woods-mathews.com/  This test is not yet approved or cleared by the [place] FDA and  has been authorized for detection and/or diagnosis of SARS-CoV-2 by FDA under an Emergency Use Authorization (EUA). This EUA will remain  in effect (meaning this test can be used) for the duration of the COVID-19 declaration under Se ction 564(b)(1) of the Act, 21 U.S.C. section 360bbb-3(b)(1), unless the authorization is terminated or revoked sooner.  Performed at [facility], [place] [address] [place], [place] [address]   Culture, blood (routine x 2)     Status: None (Preliminary result)   Collection Time: 09/08/20  9:33 PM   Specimen: BLOOD  Result Value Ref Range Status   Specimen Description   Final    BLOOD BLOOD RIGHT HAND Performed at [facility] [address] [place], [place] [address]    Special Requests   Final    BOTTLES DRAWN AEROBIC ONLY Blood Culture adequate volume Performed at [facility] [address], [place], [place] [address]    Culture   Final    NO GROWTH 4 DAYS Performed at [facility], [place] [address] [place], [place] [address]    Report Status PENDING  Incomplete  Culture, blood (routine x 2)     Status: None (Preliminary result)   Collection Time: 09/08/20  9:33 PM   Specimen: BLOOD  Result Value Ref Range Status   Specimen Description   Final    BLOOD RIGHT WRIST Performed at [facility] [address] [place],  [place] [address]    Special Requests   Final    BOTTLES  DRAWN AEROBIC ONLY Blood Culture adequate volume Performed at [facility] [address] [place], [place] [address]    Culture   Final    NO GROWTH 4 DAYS Performed at [facility], [place] [address] [place], [place] [address]    Report Status PENDING  Incomplete    RADIOLOGY STUDIES/RESULTS: IR IVC FILTER PLMT / S&I Burke Keels GUID/MOD SED  Result Date: 09/12/2020 INDICATION: DVT with contraindication to anticoagulation. Please perform IVC filter placement for caval interruption purposes. Given patient's multiple medical comorbidities, the IVC filter will be considered a permanent device in the patient will not be actively followed by the interventional radiology service for retrieval. EXAM: ULTRASOUND GUIDANCE FOR VASCULAR ACCESS IVC CATHETERIZATION AND VENOGRAM IVC FILTER INSERTION MEDICATIONS: None. ANESTHESIA/SEDATION: Fentanyl 100 mcg IV; Versed 2 mg IV Sedation Time: 11 minutes; The patient was continuously monitored during the procedure by the interventional radiology nurse under my direct supervision. CONTRAST:  None, CO2 was utilized for this examination FLUOROSCOPY TIME:  42 seconds (16 mGy) COMPLICATIONS: None immediate. PROCEDURE: Informed written consent was obtained from the patient following explanation of the procedure, risks, benefits and alternatives. A time out was performed prior to the initiation of the procedure. Given the presence of the existing right jugular approach central venous catheter and decision was made to proceed with IVC filter placement via the right common femoral vein. Maximal barrier sterile technique utilized including caps, mask, sterile gowns, sterile gloves, large sterile drape, hand hygiene, and Betadine prep. Under sterile condition and local anesthesia, right common femoral venous access was performed with ultrasound. An ultrasound image was saved and sent to PACS. Over a guidewire, the IVC filter delivery sheath and inner dilator were  advanced into the IVC just above the IVC bifurcation. CO2 injection was performed for an IVC venogram. Through the delivery sheath, a retrievable Denali IVC filter was deployed below the level of the renal veins and above the IVC bifurcation. The delivery sheath was removed and hemostasis was obtained with manual compression. A dressing was placed. The patient tolerated the procedure well without immediate post procedural complication. FINDINGS: The IVC is patent. No evidence of thrombus, stenosis, or occlusion. No variant venous anatomy. Successful placement of the IVC filter below the level of the renal veins. IMPRESSION: Successful ultrasound and fluoroscopically guided placement of an infrarenal retrievable IVC filter. PLAN: Due to patient related comorbidities and/or clinical necessity, this IVC filter should be considered a permanent device. This patient will not be actively followed for future filter retrieval. Electronically Signed   By: Sandi Mariscal M.D.   On: 09/12/2020 07:02   VAS [place] UPPER EXTREMITY VENOUS DUPLEX  Result Date: 09/10/2020 UPPER VENOUS STUDY  Patient Name:  Michelle Cain  Date of Exam:   09/10/2020 Medical Rec #: [id number]          Accession #:    [id number] Date of Birth: 1965-11-11          Patient Gender: F Patient Age:   20Y Exam Location:  [facility] Procedure:      VAS [place] UPPER EXTREMITY VENOUS DUPLEX Referring Phys: [id number] [place] --------------------------------------------------------------------------------  Indications: Edema Limitations: Poor ultrasound/tissue interface, bandages and line. Comparison Study: No prior study Performing Technologist: Maudry Mayhew MHA, RDMS, RVT, RDCS  Examination Guidelines: A complete evaluation includes B-mode imaging, spectral Doppler, color Doppler, and power Doppler as needed of all accessible  portions of each vessel. Bilateral testing is considered an integral part of a complete examination. Limited examinations for  reoccurring indications may be performed as noted.  Right Findings: +----------+------------+---------+-----------+--------------+-----------------+ RIGHT     CompressiblePhasicitySpontaneous  Properties       Summary      +----------+------------+---------+-----------+--------------+-----------------+ IJV                      Yes       Yes                                    +----------+------------+---------+-----------+--------------+-----------------+ Subclavian    None                 No                         Acute       +----------+------------+---------+-----------+--------------+-----------------+ Axillary      None                 Yes      partially   Age Indeterminate                                           re-cannulated                   +----------+------------+---------+-----------+--------------+-----------------+ Brachial      Full                                                        +----------+------------+---------+-----------+--------------+-----------------+ Radial        Full                                                        +----------+------------+---------+-----------+--------------+-----------------+ Ulnar         Full                                                        +----------+------------+---------+-----------+--------------+-----------------+ Cephalic      Full                                                        +----------+------------+---------+-----------+--------------+-----------------+ Basilic       Full                                                        +----------+------------+---------+-----------+--------------+-----------------+  Left Findings: +----------+------------+---------+-----------+----------+-------+ LEFT      CompressiblePhasicitySpontaneousPropertiesSummary +----------+------------+---------+-----------+----------+-------+  Subclavian               Yes       Yes                       +----------+------------+---------+-----------+----------+-------+  Summary:  Right: Findings consistent with acute deep vein thrombosis involving the right subclavian vein. Findings consistent with age indeterminate deep vein thrombosis involving the right axillary vein.  Left: No evidence of thrombosis in the subclavian.  *See table(s) above for measurements and observations.  Diagnosing physician: Monica Martinez MD Electronically signed by Monica Martinez MD on 09/10/2020 at 4:32:26 PM.    Final      LOS: 4 days   Oren Binet, MD  [facility]    To contact the attending provider between 7A-7P or the covering provider during after hours 7P-7A, please log into the web site [email or web address] and access using universal [place] password for that web site. If you do not have the password, please call the hospital operator.  09/12/2020, 10:34 AM

## 2020-09-13 DIAGNOSIS — D62 Acute posthemorrhagic anemia: Secondary | ICD-10-CM | POA: Diagnosis not present

## 2020-09-13 DIAGNOSIS — I2699 Other pulmonary embolism without acute cor pulmonale: Secondary | ICD-10-CM | POA: Diagnosis not present

## 2020-09-13 DIAGNOSIS — I248 Other forms of acute ischemic heart disease: Secondary | ICD-10-CM | POA: Diagnosis not present

## 2020-09-13 DIAGNOSIS — D649 Anemia, unspecified: Secondary | ICD-10-CM | POA: Diagnosis not present

## 2020-09-13 DIAGNOSIS — Z515 Encounter for palliative care: Secondary | ICD-10-CM | POA: Diagnosis not present

## 2020-09-13 DIAGNOSIS — K922 Gastrointestinal hemorrhage, unspecified: Secondary | ICD-10-CM | POA: Diagnosis not present

## 2020-09-13 DIAGNOSIS — R531 Weakness: Secondary | ICD-10-CM | POA: Diagnosis not present

## 2020-09-13 LAB — TYPE AND SCREEN
ABO/RH(D): A POS
ABO/RH(D): A POS
Antibody Screen: NEGATIVE
Antibody Screen: NEGATIVE
Unit division: 0
Unit division: 0
Unit division: 0
Unit division: 0

## 2020-09-13 LAB — BPAM RBC
Blood Product Expiration Date: 202206022359
Blood Product Expiration Date: 202206022359
Blood Product Expiration Date: 202206182359
Blood Product Expiration Date: 202206182359
ISSUE DATE / TIME: 202205281317
ISSUE DATE / TIME: 202205291144
ISSUE DATE / TIME: 202205291600
ISSUE DATE / TIME: 202205292049
Unit Type and Rh: 6200
Unit Type and Rh: 6200
Unit Type and Rh: 6200
Unit Type and Rh: 6200

## 2020-09-13 LAB — HEPARIN LEVEL (UNFRACTIONATED)
Heparin Unfractionated: 0.18 IU/mL — ABNORMAL LOW (ref 0.30–0.70)
Heparin Unfractionated: 0.28 IU/mL — ABNORMAL LOW (ref 0.30–0.70)
Heparin Unfractionated: 0.4 IU/mL (ref 0.30–0.70)

## 2020-09-13 LAB — CULTURE, BLOOD (ROUTINE X 2)
Culture: NO GROWTH
Culture: NO GROWTH
Special Requests: ADEQUATE
Special Requests: ADEQUATE

## 2020-09-13 LAB — CBC
HCT: 31.2 % — ABNORMAL LOW (ref 36.0–46.0)
Hemoglobin: 10.2 g/dL — ABNORMAL LOW (ref 12.0–15.0)
MCH: 29.6 pg (ref 26.0–34.0)
MCHC: 32.7 g/dL (ref 30.0–36.0)
MCV: 90.4 fL (ref 80.0–100.0)
Platelets: 128 10*3/uL — ABNORMAL LOW (ref 150–400)
RBC: 3.45 MIL/uL — ABNORMAL LOW (ref 3.87–5.11)
RDW: 17.5 % — ABNORMAL HIGH (ref 11.5–15.5)
WBC: 7.4 10*3/uL (ref 4.0–10.5)
nRBC: 5.8 % — ABNORMAL HIGH (ref 0.0–0.2)

## 2020-09-13 NOTE — Progress Notes (Signed)
OT Cancellation Note  Patient Details Name: Michelle Cain MRN: [id number] DOB: 1965/06/19   Cancelled Treatment:    Reason Eval/Treat Not Completed: Fatigue/lethargy limiting ability to participate. Pt reports fatigued and just returned back to bed, politely declining OT at this time.  Will follow and see as able.   Jolaine Artist, OT Acute Rehabilitation Services Pager [phone] Office [phone]   [name] 09/13/2020, 1:12 PM

## 2020-09-13 NOTE — Progress Notes (Signed)
Blood transfusion ended, patient tolerated well, VVS we'll continue to monitor.

## 2020-09-13 NOTE — Evaluation (Signed)
Physical Therapy Evaluation Patient Details Name: Michelle Cain MRN: [id number] DOB: Jan 10, 1966 Today's Date: 09/13/2020   History of Present Illness  Pt is a 55 y/o female admitted 5/25 secondary to increased SOB and syncopal episode. Found to have PE. Pt is s/p bilateral pulmonary thrombectomy and central venous catheter placement on 5/26. Pt is s/p IVC filter placement on 5/29. PMH includes gastric cancer and neuropathy.  Clinical Impression  Pt admitted secondary to problem above with deficits below. Pt tolerated mobility to chair well this session. Reports she was asymptomatic and feeling much better than before she came in. Required min to min guard to stand and transfer to the chair this session; reports this is less help than she needed prior to admission. Recommending HHPT at d/c to increase independence and safety with mobility. Will continue to follow acutely.     Follow Up Recommendations Home health PT;Supervision for mobility/OOB    Equipment Recommendations  None recommended by PT    Recommendations for Other Services       Precautions / Restrictions Precautions Precautions: Fall Restrictions Weight Bearing Restrictions: No      Mobility  Bed Mobility Overal bed mobility: Needs Assistance Bed Mobility: Supine to Sit     Supine to sit: Supervision     General bed mobility comments: Supervision for safety and line management    Transfers Overall transfer level: Needs assistance Equipment used: 1 person hand held assist Transfers: Sit to/from Stand;Stand Pivot Transfers Sit to Stand: Min assist Stand pivot transfers: Min guard       General transfer comment: Min A for steadying assist to stand. Min guard for safety to transfer to chair. VSS and pt asymptomatic throughout.  Ambulation/Gait                Stairs            Wheelchair Mobility    Modified Rankin (Stroke Patients Only)       Balance Overall balance assessment: Needs  assistance Sitting-balance support: No upper extremity supported;Feet supported Sitting balance-Leahy Scale: Fair     Standing balance support: Single extremity supported;During functional activity Standing balance-Leahy Scale: Poor Standing balance comment: Reliant on at least 1 UE support                             Pertinent Vitals/Pain Pain Assessment: No/denies pain    Home Living Family/patient expects to be discharged to:: Private residence Living Arrangements: Parent Available Help at Discharge: Family;Available 24 hours/day Type of Home: House Home Access: Stairs to enter   [employer] of Steps: 1 Home Layout: One level Home Equipment: Walker - 2 wheels;Cane - single point;Shower seat;Bedside commode;Wheelchair - manual      Prior Function Level of Independence: Needs assistance   Gait / Transfers Assistance Needed: Pt reports she was requiring significant assistance to transfer to [facility]. Family also had to bump her up the step at her home in her WC.  ADL's / Homemaking Assistance Needed: Required assist for ADL tasks.        Hand Dominance        Extremity/Trunk Assessment   Upper Extremity Assessment Upper Extremity Assessment: Defer to OT evaluation    Lower Extremity Assessment Lower Extremity Assessment: Generalized weakness    Cervical / Trunk Assessment Cervical / Trunk Assessment: Kyphotic  Communication   Communication: No difficulties  Cognition Arousal/Alertness: Awake/alert Behavior During Therapy: WFL for tasks assessed/performed Overall Cognitive Status:  Within Functional Limits for tasks assessed                                        General Comments General comments (skin integrity, edema, etc.): Pt's family present throughout session    Exercises     Assessment/Plan    PT Assessment Patient needs continued PT services  PT Problem List Decreased strength;Decreased activity  tolerance;Decreased balance;Decreased mobility;Decreased knowledge of use of DME;Decreased knowledge of precautions       PT Treatment Interventions Gait training;DME instruction;Functional mobility training;Therapeutic activities;Therapeutic exercise;Balance training;Wheelchair mobility training;Patient/family education    PT Goals (Current goals can be found in the Care Plan section)  Acute Rehab PT Goals Patient Stated Goal: to go home PT Goal Formulation: With patient Time For Goal Achievement: 09/27/20 Potential to Achieve Goals: Good    Frequency Min 3X/week   Barriers to discharge        Co-evaluation               AM-PAC PT "6 Clicks" Mobility  Outcome Measure Help needed turning from your back to your side while in a flat bed without using bedrails?: None Help needed moving from lying on your back to sitting on the side of a flat bed without using bedrails?: A Little Help needed moving to and from a bed to a chair (including a wheelchair)?: A Little Help needed standing up from a chair using your arms (e.g., wheelchair or bedside chair)?: A Little Help needed to walk in hospital room?: A Little Help needed climbing 3-5 steps with a railing? : A Lot 6 Click Score: 18    End of Session   Activity Tolerance: Patient tolerated treatment well Patient left: in chair;with call bell/phone within reach;with family/visitor present Nurse Communication: Mobility status PT Visit Diagnosis: Other abnormalities of gait and mobility (R26.89);Muscle weakness (generalized) (M62.81)    Time: 6712-4580 PT Time Calculation (min) (ACUTE ONLY): 19 min   Charges:   PT Evaluation $PT Eval Moderate Complexity: 1 Mod          Reuel Derby, PT, DPT  Acute Rehabilitation Services  Pager: [phone] Office: [phone]   Rudean Hitt 09/13/2020, 10:56 AM

## 2020-09-13 NOTE — Progress Notes (Signed)
ANTICOAGULATION CONSULT NOTE - Follow Up Consult  Pharmacy Consult for heparin Indication: pulmonary embolus and LLE + RUE DVTs  Labs: Recent Labs    09/11/20 0500 09/11/20 0530 09/12/20 0731 09/12/20 1130 09/13/20 0127 09/13/20 0656 09/13/20 1251  HGB 7.5*  --  7.0* 6.0* 10.2*  --   --   HCT 23.7*  --  22.4* 19.8* 31.2*  --   --   PLT 138*  --  126* 135* 128*  --   --   HEPARINUNFRC  --    < > 0.29*  --  0.18* 0.28* 0.40  CREATININE 0.59  --  0.57  --   --   --   --    < > = values in this interval not displayed.    Assessment: 55yo female slightly therapeutic on heparin (0.40), on heparin at 600 u/hr. Overnight patient needed blood transfusion for drop in Hgb. Hgb 10.2 post transfusion, PLT stable at 128.  Per RN, no overt s/s of bleeding now.   Goal of Therapy:  Heparin level 0.3-0.5 units/ml   Plan:  Continue heparin gtt to 600 units/hr and check level in 6 hours.    -daily CBC and HL  - plan switch to lovenox tomorrow morning 5/31 unless overt bleeding  Norina Buzzard, PharmD PGY1 Pharmacy Resident 09/13/2020 2:06 PM

## 2020-09-13 NOTE — Progress Notes (Signed)
Daily Progress Note   Patient Name: Michelle Cain       Date: 09/13/2020 DOB: 1965/12/10  Age: 55 y.o. MRN#: [id number] Attending Physician: Jonetta Osgood, MD Primary Care Physician: Laurette Schimke, DO Admit Date: 09/08/2020  Reason for Consultation/Follow-up: Establishing goals of care  Subjective:  resting comfortably in bed, family at bedside. Patient able to speak in full sentences, on room air, denies pain or other complaints, see below.   Length of Stay: 5  Current Medications: Scheduled Meds:  . sodium chloride   Intravenous Once  . chlorhexidine  15 mL Mouth Rinse BID  . Chlorhexidine Gluconate Cloth  6 each Topical Daily  . dronabinol  2.5 mg Oral BID  . feeding supplement  1 Container Oral TID BM  . mouth rinse  15 mL Mouth Rinse q12n4p  . pantoprazole (PROTONIX) IV  40 mg Intravenous Q12H  . sodium chloride flush  10-40 mL Intracatheter Q12H  . sucralfate  1 g Oral TID WC & HS    Continuous Infusions: . heparin 600 Units/hr (09/13/20 0327)    PRN Meds: acetaminophen **OR** acetaminophen, ondansetron (ZOFRAN) IV, oxyCODONE, promethazine, sodium chloride flush  Physical Exam         Awake alert No distress Resting in bed Regular work of breathing Regular Abdomen not distended No focal deficits Has LE edema  Vital Signs: BP (!) 107/59 (BP Location: Left Leg)   Pulse 98   Temp 99.2 F (37.3 C) (Oral)   Resp 17   Ht 5\' 4"  (1.626 m)   Wt 80.2 kg   SpO2 100%   BMI 30.35 kg/m  SpO2: SpO2: 100 % O2 Device: O2 Device: Room Air O2 Flow Rate: O2 Flow Rate (L/min): 2 L/min  Intake/output summary:   Intake/Output Summary (Last 24 hours) at 09/13/2020 1515 Last data filed at 09/13/2020 0015 Gross per 24 hour  Intake 1018 ml  Output 2000 ml  Net  -982 ml   LBM: Last BM Date: 09/07/20 Baseline Weight: Weight: 74.6 kg Most recent weight: Weight: 80.2 kg       Palliative Assessment/Data: PPS 50%     Patient Active Problem List   Diagnosis Date Noted  . Palliative care by specialist   . General weakness   . Acute massive pulmonary embolism ([place])   . Demand  ischemia ([place])   . Symptomatic anemia 09/08/2020  . Hypokalemia 05/12/2020  . Hyponatremia 05/12/2020  . CAP (community acquired pneumonia) 05/11/2020  . Port-A-Cath in place 10/22/2019  . Genetic testing 09/19/2019  . Malignant neoplasm of stomach ([place]) 08/26/2019  . Goals of care, counseling/discussion 08/26/2019  . Plantar fasciitis 07/31/2012  . Right knee pain 05/28/2012    Palliative Care Assessment & Plan   Patient Profile:    Assessment:  Acute blood loss anemia secondary to GIB in setting of gastric cancer.  Acute massive pulmonary embolism s/p thrombectomy 5/26   Recommendations/Plan: Remains full code full scope care, recognizes high risk for bleeding, is open to any and all offered life maintaining life prolonging interventions, is thankful for her care and for information provided by all of her team members. Patient and family are appreciative of all the care she is being given. They are awaiting further consultation with [facility] regarding the patient's malignancy.  PMT will follow peripherally.    Code Status:    Code Status Orders  (From admission, onward)         Start     Ordered   09/08/20 1551  Full code  Continuous        09/08/20 1558        Code Status History    Date Active Date Inactive Code Status Order ID Comments User Context   05/12/2020 2142 05/13/2020 2124 Full Code [id number]  Elwyn Reach, MD Inpatient   Advance Care Planning Activity       Prognosis:   Unable to determine  Discharge Planning:  To Be Determined  Care plan was discussed with  Patient, family at bedside.   Thank you for allowing the  Palliative Medicine Team to assist in the care of this patient.   Time In: 1500 Time Out: 1515 Total Time 15 Prolonged Time Billed No       Greater than 50%  of this time was spent counseling and coordinating care related to the above assessment and plan.  Loistine Chance, MD  Please contact Palliative Medicine Team phone at [phone] for questions and concerns.

## 2020-09-13 NOTE — Progress Notes (Signed)
PROGRESS NOTE        PATIENT DETAILS Name: Michelle Cain Age: 55 y.o. Sex: female Date of Birth: 08-06-1965 Admit Date: 09/08/2020 Admitting Physician Jonnie Finner, DO [id number], [name], DO  Brief Narrative: Patient is a 55 y.o. female with gastric cancer-on chemotherapy as an outpatient-(last EGD on 5/12 showed entire gastric mucosa replaced by malignancy)-history of lower extremity DVT-Eliquis on hold since EGD on 5/12-presented to the hospital on 5/25 with a syncopal episode and acute blood loss anemia due to low-grade GI bleeding from known gastric cancer.   Further work-up revealed massive PE with acute cor pulmonale-requiring transfer to the ICU-and mechanical thrombectomy.  See below for further details.  Significant events: 5/25>> admit to [facility] for syncope-symptomatic anemia-having melanotic stools. 5/26>> diagnosed with massive PE-acute cor pulmonale on echo-to [facility] for thrombectomy 5/27>> bilateral pulmonary artery thrombectomy by IR 5/29>> infra renal IVC filter placed by IR 5/29>> transferred to [facility] from ICU  Significant studies: 5/12>> EGD: Gastric mucosa completely replaced by cancer-tumor ulcerated in areas-with spontaneous oozing 5/26>> VQ scan: Consistent with PE 5/26>> CTA chest: Acute PE with CT evidence of right heart strain 5/26>> Echo EF 40-81%, RV systolic function moderately reduced 5/26>> bilateral lower extremity Doppler: DVT in left popliteal/posterior tibial/peroneal vein 5/27>> bilateral upper extremity DVT: DVT in right subclavian and axillary vein  Antimicrobial therapy: Cefepime: 5/25>> 5/26 Vancomycin: 5/25 x 1  Microbiology data: 5/25>> blood culture: No growth 5/25>> COVID PCR: Negative  Procedures : 5/27>> bilateral pulmonary artery thrombectomy by IR 5/29>> infra renal IVC filter placed by IR  Consults: Cardiology, PCCM, IR, oncology, palliative care  DVT Prophylaxis : SCDs Start: 09/08/20 1550 On IV  heparin   Subjective: No hematemesis.  Has not had a BM since the past 2-3 days.  Lying comfortably in bed.  Assessment/Plan: Acute massive pulmonary embolism-s/p thrombectomy by IR on 5/27: On IV heparin-if no gross/overt GI bleeding in the next 24 hours-and hemoglobin remains stable-plan is to see if we can switch her to subcutaneous Lovenox on 5/31.   History of left lower extremity DVT-s/p IVC filter on 5/29: Previously on anticoagulation-Per chart review-this was discontinued following EGD findings on 5/12.  On IV heparin-plan to switch to Lovenox on 5/31-see above.  Right subclavian/axillary vein and DVT: Difficult situation-currently on IV heparin.  Discussed with vascular surgery-Dr. Carlis Abbott on 5/29-no good options for surgical treatment-not a candidate for lytic therapy given EGD findings-does not think vena cava filter in the SVC is possible.  Recommends compression and elevation of affected extremity.  Low-grade GI bleeding with acute blood loss anemia due to gastric cancer: EGD findings on 5/12 reviewed-GI input reviewed-very difficult situation-especially given significant clot burden and need for anticoagulation.  Transfused 2 units of PRBC on 5/29 (total of 3 units so far).  No overt/gross GI bleeding overnight-plans are to continue with anticoagulation with IV heparin-if she remains stable-we will plan to transition her to SQ Lovenox on 5/31.  However if gross bleeding develops-plan is to stop all anticoagulation.  Continue PPI/Carafate.    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  Per oncology-this is not a curative situation-oncology is in the process of trying to get a second opinion with GI oncology at [facility]  History ofLeft breast cancer 2008,pT1c,pN0, status post a left  lumpectomy with adjuvant chemotherapy and radiation, ER positive, PR positive, HER-2  positive  Minimally elevated troponin: Secondary to demand ischemia/PE-not ACS-not a candidate for any further work-up given advanced malignancy.  Thrombocytopenia: Improving slowly-probably reflective of consumptive thrombocytopenia in the setting of significant VTE.  Palliative care: Full code-palliative care following.  Patient/family aware of difficult situation-GI bleeding and VTE at the same time.  Thankfully bleeding appears to be low-grade-plan is to see if she can tolerate anticoagulation.  Oncology following with plans to refer to GI [facility] oncology for second opinion.  Suspect very poor prognosis given progression of underlying malignancy in spite of chemotherapy.  Obesity: Estimated body mass index is 30.35 kg/m as calculated from the following:   Height as of this encounter: 5' 4" (1.626 m).   Weight as of this encounter: 80.2 kg.     Diet: Diet Order            DIET SOFT Room service appropriate? Yes; Fluid consistency: Thin  Diet effective now                  Code Status: Full code   Family Communication: Mother/Brother at bedside on 5/29  Disposition Plan: Status is: Inpatient  Remains inpatient appropriate because:Inpatient level of care appropriate due to severity of illness   Dispo:  Patient From: Home  Planned Disposition: To be determined  Medically stable for discharge: No      Barriers to Discharge: Low-grade GI bleeding-VTE-on IV heparin-see above discussion  Antimicrobial agents: Anti-infectives (From admission, onward)   Start     Dose/Rate Route Frequency Ordered Stop   09/09/20 2200  vancomycin (VANCOREADY) IVPB 1500 mg/300 mL  Status:  Discontinued        1,500 mg 150 mL/hr over 120 Minutes Intravenous Every 24 hours 09/08/20 1720 09/09/20 1241   09/08/20 2000  ceFEPIme (MAXIPIME) 2 g in sodium chloride 0.9 % 100 mL IVPB  Status:  Discontinued        2 g 200 mL/hr over 30 Minutes Intravenous Every 8 hours 09/08/20 1705 09/09/20 1241    09/08/20 2000  vancomycin (VANCOREADY) IVPB 1500 mg/300 mL        1,500 mg 150 mL/hr over 120 Minutes Intravenous  Once 09/08/20 1716 09/09/20 0105       Time spent: 35 minutes-Greater than 50% of this time was spent in counseling, explanation of diagnosis, planning of further management, and coordination of care.  MEDICATIONS: Scheduled Meds: . sodium chloride   Intravenous Once  . chlorhexidine  15 mL Mouth Rinse BID  . Chlorhexidine Gluconate Cloth  6 each Topical Daily  . dronabinol  2.5 mg Oral BID  . feeding supplement  1 Container Oral TID BM  . mouth rinse  15 mL Mouth Rinse q12n4p  . pantoprazole (PROTONIX) IV  40 mg Intravenous Q12H  . sodium chloride flush  10-40 mL Intracatheter Q12H  . sucralfate  1 g Oral TID WC & HS   Continuous Infusions: . heparin 600 Units/hr (09/13/20 0327)   PRN Meds:.acetaminophen **OR** acetaminophen, ondansetron (ZOFRAN) IV, oxyCODONE, promethazine, sodium chloride flush   PHYSICAL EXAM: Vital signs: Vitals:   09/12/20 2115 09/13/20 0015 09/13/20 0037 09/13/20 0805  BP: 122/73 123/65  119/62  Pulse: 99 100  96  Resp: _0 Temp: 99.1 F (37.3 C) 98.8 F (37.1 C)  99.2 F (37.3 C)  TempSrc: Oral Oral  Oral  SpO2: 100% 100%  100%  Weight:  Height:       Filed Weights   09/08/20 1526 09/12/20 0442  Weight: 74.6 kg 80.2 kg   Body mass index is 30.35 kg/m.   Gen Exam:Alert awake-not in any distress HEENT:atraumatic, normocephalic Chest: B/L clear to auscultation anteriorly CVS:S1S2 regular Abdomen:soft non tender, non distended Extremities:no edema Neurology: Non focal Skin: no rash  I have personally reviewed following labs and imaging studies  LABORATORY DATA: CBC: Recent Labs  Lab 09/08/20 1213 09/09/20 0330 09/09/20 1908 09/09/20 2137 09/10/20 0057 09/10/20 1110 09/10/20 1221 09/10/20 2100 09/11/20 0500 09/12/20 0731 09/12/20 1130 09/13/20 0127  WBC 12.1*   < > 9.3 8.2   < > 6.6  --   --   7.0 7.4 6.5 7.4  NEUTROABS 9.7*  --  7.3 6.4  --   --   --   --   --   --   --   --   HGB 7.3*   < > 10.0* 8.8*   < > 6.3*   < > 8.0* 7.5* 7.0* 6.0* 10.2*  HCT 25.2*   < > 31.6* 27.0*   < > 19.6*   < > 25.1* 23.7* 22.4* 19.8* 31.2*  MCV 93.3   < > 92.4 90.0   < > 92.9  --   --  92.6 94.1 96.1 90.4  PLT 184   < > 124* 121*   < > 96*  --   --  138* 126* 135* 128*   < > = values in this interval not displayed.    Basic Metabolic Panel: Recent Labs  Lab 09/08/20 1213 09/08/20 2133 09/09/20 0330 09/11/20 0500 09/12/20 0731  NA 135  --  136 134* 136  K 3.9  --  3.9 3.0* 4.0  CL 103  --  107 107 108  CO2 22  --  20* 23 25  GLUCOSE 185*  --  96 124* 97  BUN 17  --  _0 CREATININE 0.76  --  0.36* 0.59 0.57  CALCIUM 8.4*  --  7.6* 7.4* 7.4*  MG  --  1.8  --   --   --     GFR: Estimated Creatinine Clearance: 83.3 mL/min (by C-G formula based on SCr of 0.57 mg/dL).  Liver Function Tests: Recent Labs  Lab 09/08/20 1213 09/09/20 0330 09/11/20 0500 09/12/20 0731  AST _1 ALT _2 ALKPHOS 101 79 60 63  BILITOT 0.2* 0.9 0.6 0.6  PROT 6.7 5.5* 4.5* 4.7*  ALBUMIN 2.9* 2.4* 1.8* 1.9*   Recent Labs  Lab 09/08/20 1213  LIPASE 17   No results for input(s): AMMONIA in the last 168 hours.  Coagulation Profile: Recent Labs  Lab 09/09/20 1818  INR 1.0    Cardiac Enzymes: No results for input(s): CKTOTAL, CKMB, CKMBINDEX, TROPONINI in the last 168 hours.  BNP (last 3 results) No results for input(s): PROBNP in the last 8760 hours.  Lipid Profile: No results for input(s): CHOL, HDL, LDLCALC, TRIG, CHOLHDL, LDLDIRECT in the last 72 hours.  Thyroid Function Tests: No results for input(s): TSH, T4TOTAL, FREET4, T3FREE, THYROIDAB in the last 72 hours.  Anemia Panel: No results for input(s): VITAMINB12, FOLATE, FERRITIN, TIBC, IRON, RETICCTPCT in the last 72 hours.  Urine analysis:    Component Value Date/Time   COLORURINE AMBER (A) 10/22/2019 1000    APPEARANCEUR CLOUDY (A) 10/22/2019 1000   LABSPEC 1.024 10/22/2019 1000   PHURINE 5.0 10/22/2019 1000  GLUCOSEU NEGATIVE 10/22/2019 1000   HGBUR LARGE (A) 10/22/2019 1000   BILIRUBINUR NEGATIVE 10/22/2019 1000   KETONESUR NEGATIVE 10/22/2019 1000   PROTEINUR 100 (A) 03/02/2020 0950   NITRITE NEGATIVE 10/22/2019 1000   LEUKOCYTESUR SMALL (A) 10/22/2019 1000    Sepsis Labs: Lactic Acid, Venous    Component Value Date/Time   LATICACIDVEN 1.3 09/08/2020 2133    MICROBIOLOGY: Recent Results (from the past 240 hour(s))  SARS CORONAVIRUS 2 (TAT 6-24 HRS) Nasopharyngeal Nasopharyngeal Swab     Status: None   Collection Time: 09/08/20 12:46 PM   Specimen: Nasopharyngeal Swab  Result Value Ref Range Status   SARS Coronavirus 2 NEGATIVE NEGATIVE Final    Comment: (NOTE) SARS-CoV-2 target nucleic acids are NOT DETECTED.  The SARS-CoV-2 RNA is generally detectable in upper and lower respiratory specimens during the acute phase of infection. Negative results do not preclude SARS-CoV-2 infection, do not rule out co-infections with other pathogens, and should not be used as the sole basis for treatment or other patient management decisions. Negative results must be combined with clinical observations, patient history, and epidemiological information. The expected result is Negative.  Fact Sheet for Patients: SugarRoll.be  Fact Sheet for Healthcare Providers: https://www.woods-mathews.com/  This test is not yet approved or cleared by the [place] FDA and  has been authorized for detection and/or diagnosis of SARS-CoV-2 by FDA under an Emergency Use Authorization (EUA). This EUA will remain  in effect (meaning this test can be used) for the duration of the COVID-19 declaration under Se ction 564(b)(1) of the Act, 21 U.S.C. section 360bbb-3(b)(1), unless the authorization is terminated or revoked sooner.  Performed at [facility], [place] [address], [place], [place] [address]   Culture, blood (routine x 2)     Status: None   Collection Time: 09/08/20  9:33 PM   Specimen: BLOOD  Result Value Ref Range Status   Specimen Description   Final    BLOOD BLOOD RIGHT HAND Performed at [facility] [address], [place], [place] [address]    Special Requests   Final    BOTTLES DRAWN AEROBIC ONLY Blood Culture adequate volume Performed at [facility] [address] [place], [place] [address]    Culture   Final    NO GROWTH 5 DAYS Performed at [facility], [place] [address] [place], [place] [address]    Report Status 09/13/2020 FINAL  Final  Culture, blood (routine x 2)     Status: None   Collection Time: 09/08/20  9:33 PM   Specimen: BLOOD  Result Value Ref Range Status   Specimen Description   Final    BLOOD RIGHT WRIST Performed at [facility] [address] [place], [place] [address]    Special Requests   Final    BOTTLES DRAWN AEROBIC ONLY Blood Culture adequate volume Performed at [facility] [address] [place], [place] [address]    Culture   Final    NO GROWTH 5 DAYS Performed at [facility], [place] [address], [place], [place] [address]    Report Status 09/13/2020 FINAL  Final    RADIOLOGY STUDIES/RESULTS: IR IVC FILTER PLMT / S&I Burke Keels GUID/MOD SED  Result Date: 09/12/2020 INDICATION: DVT with contraindication to anticoagulation. Please perform IVC filter placement for caval interruption purposes. Given patient's multiple medical comorbidities, the IVC filter will be considered a permanent device in the patient will not be actively  followed by the interventional radiology service for retrieval. EXAM: ULTRASOUND GUIDANCE FOR VASCULAR ACCESS IVC CATHETERIZATION AND VENOGRAM IVC FILTER INSERTION MEDICATIONS: None. ANESTHESIA/SEDATION: Fentanyl 100 mcg IV; Versed 2 mg IV Sedation Time: 11 minutes; The patient was  continuously monitored during the procedure by the interventional radiology nurse under my direct supervision. CONTRAST:  None, CO2 was utilized for this examination FLUOROSCOPY TIME:  42 seconds (16 mGy) COMPLICATIONS: None immediate. PROCEDURE: Informed written consent was obtained from the patient following explanation of the procedure, risks, benefits and alternatives. A time out was performed prior to the initiation of the procedure. Given the presence of the existing right jugular approach central venous catheter and decision was made to proceed with IVC filter placement via the right common femoral vein. Maximal barrier sterile technique utilized including caps, mask, sterile gowns, sterile gloves, large sterile drape, hand hygiene, and Betadine prep. Under sterile condition and local anesthesia, right common femoral venous access was performed with ultrasound. An ultrasound image was saved and sent to PACS. Over a guidewire, the IVC filter delivery sheath and inner dilator were advanced into the IVC just above the IVC bifurcation. CO2 injection was performed for an IVC venogram. Through the delivery sheath, a retrievable Denali IVC filter was deployed below the level of the renal veins and above the IVC bifurcation. The delivery sheath was removed and hemostasis was obtained with manual compression. A dressing was placed. The patient tolerated the procedure well without immediate post procedural complication. FINDINGS: The IVC is patent. No evidence of thrombus, stenosis, or occlusion. No variant venous anatomy. Successful placement of the IVC filter below the level of the renal veins. IMPRESSION: Successful ultrasound and fluoroscopically guided placement of an infrarenal retrievable IVC filter. PLAN: Due to patient related comorbidities and/or clinical necessity, this IVC filter should be considered a permanent device. This patient will not be actively followed for future filter retrieval. Electronically  Signed   By: Sandi Mariscal M.D.   On: 09/12/2020 07:02     LOS: 5 days   Oren Binet, MD  [facility]    To contact the attending provider between 7A-7P or the covering provider during after hours 7P-7A, please log into the web site [email or web address] and access using universal [place] password for that web site. If you do not have the password, please call the hospital operator.  09/13/2020, 11:21 AM

## 2020-09-13 NOTE — Care Management Important Message (Signed)
Important Message  Patient Details  Name: Michelle Cain MRN: [id number] Date of Birth: May 17, 1965   Medicare Important Message Given:  Yes     Shelda Altes 09/13/2020, 9:36 AM

## 2020-09-13 NOTE — Progress Notes (Signed)
HEMATOLOGY-ONCOLOGY PROGRESS NOTE  SUBJECTIVE: She reports no bowel movement since hospital admission.  The urine is clear.  No pain.  Dyspnea has improved.  No complaint.  Oncology History  Malignant neoplasm of stomach ([place])  08/26/2019 Initial Diagnosis   Malignant neoplasm of stomach ([place])   09/04/2019 -  Chemotherapy    Patient is on Treatment Plan: GASTROESOPHAGEAL RAMUCIRUMAB D1, 15  / PACLITAXEL D1,8,15 Q28D      09/19/2019 Genetic Testing   Negative genetic testing on the common hereditary cancer panel.  The Common Hereditary Gene Panel offered by [facility] includes sequencing and/or deletion duplication testing of the following 48 genes: APC, ATM, AXIN2, BARD1, BMPR1A, BRCA1, BRCA2, BRIP1, CDH1, CDK4, CDKN2A (p14ARF), CDKN2A (p16INK4a), CHEK2, CTNNA1, DICER1, EPCAM (Deletion/duplication testing only), GREM1 (promoter region deletion/duplication testing only), KIT, MEN1, MLH1, MSH2, MSH3, MSH6, MUTYH, NBN, NF1, NHTL1, PALB2, PDGFRA, PMS2, POLD1, POLE, PTEN, RAD50, RAD51C, RAD51D, RNF43, SDHB, SDHC, SDHD, SMAD4, SMARCA4. STK11, TP53, TSC1, TSC2, and VHL.  The following genes were evaluated for sequence changes only: SDHA and HOXB13 c.251G>A variant only. The report date is September 19, 2019.     PHYSICAL EXAMINATION:  Vitals:   09/13/20 0015 09/13/20 0037  BP: 123/65   Pulse: 100   Resp: 20 18  Temp: 98.8 F (37.1 C)   SpO2: 100%    Filed Weights   09/08/20 1526 09/12/20 0442  Weight: 164 lb 7.4 oz (74.6 kg) 176 lb 12.9 oz (80.2 kg)    Intake/Output from previous day: 05/29 0701 - 05/30 0700 In: 1018 [I.V.:120; Blood:898] Out: 2000 [Urine:2000]  GENERAL:alert, no distress and comfortable HEENT: No thrush LUNGS: clear to auscultation and percussion with normal breathing effort HEART: Regular rate and rhythm, no lower extremity edema ABDOMEN:abdomen soft, non-tender, no mass, no hepatomegaly and normal bowel sounds NEURO: alert & oriented x 3 with fluent speech, no focal  motor/sensory deficits  Port-A-Cath without erythema  LABORATORY DATA:  I have reviewed the data as listed CMP Latest Ref Rng & Units 09/12/2020 09/11/2020 09/09/2020  Glucose 70 - 99 mg/dL 97 124(H) 96  BUN 6 - 20 mg/dL _0 Creatinine 0.44 - 1.00 mg/dL 0.57 0.59 0.36(L)  Sodium 135 - 145 mmol/L 136 134(L) 136  Potassium 3.5 - 5.1 mmol/L 4.0 3.0(L) 3.9  Chloride 98 - 111 mmol/L 108 107 107  CO2 22 - 32 mmol/L 25 23 20(L)  Calcium 8.9 - 10.3 mg/dL 7.4(L) 7.4(L) 7.6(L)  Total Protein 6.5 - 8.1 g/dL 4.7(L) 4.5(L) 5.5(L)  Total Bilirubin 0.3 - 1.2 mg/dL 0.6 0.6 0.9  Alkaline Phos 38 - 126 U/L 63 60 79  AST 15 - 41 U/L _1 ALT 0 - 44 U/L _2 Lab Results  Component Value Date   WBC 7.4 09/13/2020   HGB 10.2 (L) 09/13/2020   HCT 31.2 (L) 09/13/2020   MCV 90.4 09/13/2020   PLT 128 (L) 09/13/2020   NEUTROABS 6.4 09/09/2020    CT ANGIO CHEST PE W OR WO CONTRAST  Result Date: 09/09/2020 CLINICAL DATA:  55 year old female with history of abnormal V/Q scan. History of breast cancer. EXAM: CT ANGIOGRAPHY CHEST WITH CONTRAST TECHNIQUE: Multidetector CT imaging of the chest was performed using the standard protocol during bolus administration of intravenous contrast. Multiplanar CT image reconstructions and MIPs were obtained to evaluate the vascular anatomy. CONTRAST:  166m OMNIPAQUE IOHEXOL 350 MG/ML SOLN COMPARISON:  Chest CTA 08/16/2020. FINDINGS: Cardiovascular: Numerous filling defects throughout the pulmonary arterial  tree compatible with extensive pulmonary embolus. Specifically, there are 2 large saddle emboli with extension into lobar, segmental and subsegmental sized branches in the lungs bilaterally. The majority of these emboli appear nonocclusive. Pulmonic trunk is currently nondilated measuring 2.3 cm in diameter. Right ventricle appears dilated with a diameter of approximately 52 mm. Left ventricular diameter is approximately 37 mm. RV to LV ratio of 1.41. There is no  significant pericardial fluid, thickening or pericardial calcification. No atherosclerotic calcifications in the thoracic aorta or the coronary arteries. Left-sided subclavian single-lumen porta cath with tip terminating in the right atrium. Mediastinum/Nodes: No pathologically enlarged mediastinal or hilar lymph nodes. Esophagus is unremarkable in appearance. No axillary lymphadenopathy. Lungs/Pleura: There are some patchy areas of ground-glass attenuation and mild septal thickening in the lower lobes of the lungs bilaterally, nonspecific, potentially predominantly atelectatic, although areas of mild alveolar hemorrhage are difficult to exclude. No confluent consolidative airspace disease. Trace left pleural effusion. No definite suspicious appearing pulmonary nodules or masses are noted. Upper Abdomen: Small amount of perinephric stranding adjacent to the left kidney, nonspecific. Musculoskeletal: There are no aggressive appearing lytic or blastic lesions noted in the visualized portions of the skeleton. Review of the MIP images confirms the above findings. IMPRESSION: 1. Positive for acute PE with CT evidence of right heart strain (RV/LV Ratio = 1.41) consistent with at least submassive (intermediate risk) PE. The presence of right heart strain has been associated with an increased risk of morbidity and mortality. Please refer to the "PE Focused" order set in EPIC. 2. Small amount of ground-glass attenuation and septal thickening in the lower lobes of the lungs bilaterally. The possibility of areas of mild alveolar hemorrhage is not excluded, although most of this is likely atelectatic. 3. Trace left pleural effusion. Critical Value/emergent results were discussed in person at the time of interpretation on 09/09/2020 at 3:44 pm to provider Dr. Ronny Bacon, who verbally acknowledged these results. Electronically Signed   By: Vinnie Langton M.D.   On: 09/09/2020 16:00   CT ANGIO CHEST PE W OR WO CONTRAST  Result  Date: 08/16/2020 CLINICAL DATA:  Shortness of breath and chest pain. History of breast, gastric, and colon cancer. EXAM: CT ANGIOGRAPHY CHEST WITH CONTRAST TECHNIQUE: Multidetector CT imaging of the chest was performed using the standard protocol during bolus administration of intravenous contrast. Multiplanar CT image reconstructions and MIPs were obtained to evaluate the vascular anatomy. CONTRAST:  177m OMNIPAQUE IOHEXOL 350 MG/ML SOLN COMPARISON:  Chest CT May 11, 2020 FINDINGS: Cardiovascular: There is no demonstrable pulmonary embolus. A vessel in the left lower lobe makes an acute turn causing a subtle defect on the axial images which is not confirmed on other images and is not felt to represent pulmonary embolus. There is no thoracic aortic aneurysm or dissection. Visualized great vessels appear unremarkable. Port-A-Cath tip is in the superior vena cava. No evident pericardial effusion or pericardial thickening. Mediastinum/Nodes: Status post left thyroidectomy. Remaining thyroid appears normal. No appreciable adenopathy. There remains circumferential thickening in the distal esophagus which is unchanged from prior study. This area may represent previous treated neoplasm but also may represent esophagitis. The appearance in this area is stable. Lungs/Pleura: There is slight bibasilar atelectasis. No evident edema or airspace opacity. No appreciable pleural effusions. Trachea and major bronchial structures appear patent. No evident pneumothorax. Upper Abdomen: Gallbladder is absent. Occasional presumed calcified granulomas in the spleen. Incomplete visualization of cyst arising from posterior right kidney measuring 0.8 x 0.8 cm. Musculoskeletal: No blastic or  lytic bone lesions. Port present on the left anteriorly. No chest wall lesions appreciable. Review of the MIP images confirms the above findings. IMPRESSION: 1. No demonstrable pulmonary embolus. No thoracic aortic aneurysm or dissection. 2.  Circumferential thickening distal esophagus again noted which may represent residua of previous treated neoplasm but also could represent a degree of esophagitis. 3.  No edema or airspace opacity.  Mild bibasilar atelectasis. 4.  No evident adenopathy. 5. Status post left-sided thyroidectomy. Status post cholecystectomy. 6.  Port-A-Cath tip in superior vena cava near cavoatrial junction. Electronically Signed   By: Lowella Grip III M.D.   On: 08/16/2020 13:31   NM Pulmonary Perfusion  Result Date: 09/09/2020 CLINICAL DATA:  Deep venous thrombosis, shortness of breath and chest pain for 3 days, elevated D-dimer, high clinical suspicion of pulmonary embolism EXAM: NUCLEAR MEDICINE PERFUSION LUNG SCAN TECHNIQUE: Perfusion images were obtained in multiple projections after intravenous injection of radiopharmaceutical. Ventilation scans intentionally deferred if perfusion scan and chest x-ray adequate for interpretation during COVID 19 epidemic. RADIOPHARMACEUTICALS:  4.25 mCi Tc-70mMAA IV COMPARISON:  Chest radiograph 09/08/2020 FINDINGS: Subsegmental perfusion defects identified within RIGHT middle lobe and lingula. Additional subsegmental perfusion defect LEFT lower lobe. Large multi segmental perfusion defect in RIGHT upper lobe. Findings are consistent with pulmonary embolism. Chest radiograph shows mild atelectasis or infiltrate in the RIGHT lower lobe, remaining lungs clear. IMPRESSION: Multiple BILATERAL pulmonary perfusion defects consistent with pulmonary embolism. Critical Value/emergent results were called by telephone at the time of interpretation on 09/09/2020 at 1200 hrs to provider Dr. CAileen Fass who verbally acknowledged these results. Electronically Signed   By: MLavonia DanaM.D.   On: 09/09/2020 12:00   IR Angiogram Pulmonary Bilateral Selective  Result Date: 09/10/2020 INDICATION: 55year old woman with massive pulmonary artery embolism presented with syncope and saddle PE. Interventional  radiology consulted for pulmonary thrombectomy/thrombolysis. Patient unable to receive systemic tPA due to bleeding gastric mass. EXAM: 1. Ultrasound-guided access of right internal jugular vein 2. Central venous catheter placement 3. Ultrasound-guided access of right common femoral vein 4. Bilateral pulmonary angiogram and mechanical thrombectomy COMPARISON:  CT angiography chest 06/12/2020 MEDICATIONS: None ANESTHESIA/SEDATION: Sedation performed and monitored by the anesthesia team FLUOROSCOPY TIME:  Fluoroscopy Time: 20 minutes 48 seconds (83 mGy). COMPLICATIONS: None immediate. TECHNIQUE: Informed written consent was obtained from the patient after a thorough discussion of the procedural risks, benefits and alternatives. All questions were addressed. Maximal Sterile Barrier Technique was utilized including caps, mask, sterile gowns, sterile gloves, sterile drape, hand hygiene and skin antiseptic. A timeout was performed prior to the initiation of the procedure. Patient positioned supine on the procedure table. Right neck and groin skin prepped and draped in usual fashion. The right internal jugular vein was evaluated with ultrasound and shown to be patent. A permanent ultrasound image was obtained and placed in the patient's medical record. Using sterile gel and a sterile probe cover, the right internal jugular vein was entered with a 21 ga needle during real time ultrasound guidance. 0.018 inch guidewire placed and 21 ga needle exchanged for transitional dilator set. Utilizing fluoroscopy, 0.035 inch guidewire advanced through the needle without difficulty. Serial dilation performed, and catheter inserted over the guidewire. The tip was positioned in the right atrium. All lumens of the catheter aspirated and flushed well. The catheter was secured to the skin with suture. The insertion site was covered with a Biopatch and sterile dressing. The right common femoral vein was evaluated with ultrasound and shown to  be  patent. A permanent ultrasound image was obtained and placed in the patient's medical record. Using sterile gel and a sterile probe cover the right common femoral vein was entered with a 21 gauge needle during real time ultrasound guidance. 21 gauge needle exchanged for transitional dilator set over 0.018 in guidewire. Transitional dilator set exchanged for 6 French sheath over 0.035 in guidewire. 6 Fr MIV double angle pigtail catheter advanced to the main pulmonary artery utilizing fluoroscopic guidance. Pre thrombectomy pulmonary artery pressure: 39/11 mmHg (mean 26 mmHg) Double angled pigtail catheter removed over exchange length Amplatz guidewire. Vert catheter utilized to advance Amplatz guidewire into right lower lobe pulmonary artery branch. Serial dilation was performed and 6 [place] sheath exchanged for [employer] 24 French sheath. 44 [place] FlowTriver device was advanced over the wire into the lateral basilar segmental and Truncus Anterior branches of the right pulmonary arterial system. Mechanical aspiration thrombectomy was performed extirpating thrombus. Post thrombectomy pulmonary angiogram demonstrated significant reduction in overall clot burden in the right pulmonary arterial branches. The left lateral basilar segmental branch of the lower lobe artery was selected with the [facility] catheter. The 20 [place] FlowTriver device was advanced through the 24 French device over the wire into the lateral basilar segmental branch of the left pulmonary arterial system. Mechanical aspiration thrombectomy was performed extirpating thrombus. Post aspiration pulmonary angiogram demonstrated significant reduction in overall clot burden. Post thrombectomy Pulmonary artery pressure: 27/7 mmHg (mean 15 mmHg) FlowTriever device was removed. The Gore Dry Seal sheath was removed and hemostasis achieved with purse-string suture and manual compression. IMPRESSION: 1. Bilateral pulmonary angiogram and mechanical  thrombectomy. Mean pulmonary artery pressure decreased from 26 mm Hg to 15 mm Hg following thrombectomy. 2. Right IJ non tunneled central line placement. Electronically Signed   By: Miachel Roux M.D.   On: 09/10/2020 12:35   IR IVC FILTER PLMT / S&I Burke Keels GUID/MOD SED  Result Date: 09/12/2020 INDICATION: DVT with contraindication to anticoagulation. Please perform IVC filter placement for caval interruption purposes. Given patient's multiple medical comorbidities, the IVC filter will be considered a permanent device in the patient will not be actively followed by the interventional radiology service for retrieval. EXAM: ULTRASOUND GUIDANCE FOR VASCULAR ACCESS IVC CATHETERIZATION AND VENOGRAM IVC FILTER INSERTION MEDICATIONS: None. ANESTHESIA/SEDATION: Fentanyl 100 mcg IV; Versed 2 mg IV Sedation Time: 11 minutes; The patient was continuously monitored during the procedure by the interventional radiology nurse under my direct supervision. CONTRAST:  None, CO2 was utilized for this examination FLUOROSCOPY TIME:  42 seconds (16 mGy) COMPLICATIONS: None immediate. PROCEDURE: Informed written consent was obtained from the patient following explanation of the procedure, risks, benefits and alternatives. A time out was performed prior to the initiation of the procedure. Given the presence of the existing right jugular approach central venous catheter and decision was made to proceed with IVC filter placement via the right common femoral vein. Maximal barrier sterile technique utilized including caps, mask, sterile gowns, sterile gloves, large sterile drape, hand hygiene, and Betadine prep. Under sterile condition and local anesthesia, right common femoral venous access was performed with ultrasound. An ultrasound image was saved and sent to PACS. Over a guidewire, the IVC filter delivery sheath and inner dilator were advanced into the IVC just above the IVC bifurcation. CO2 injection was performed for an IVC venogram.  Through the delivery sheath, a retrievable [place] IVC filter was deployed below the level of the renal veins and above the IVC bifurcation. The delivery sheath was removed and  hemostasis was obtained with manual compression. A dressing was placed. The patient tolerated the procedure well without immediate post procedural complication. FINDINGS: The IVC is patent. No evidence of thrombus, stenosis, or occlusion. No variant venous anatomy. Successful placement of the IVC filter below the level of the renal veins. IMPRESSION: Successful ultrasound and fluoroscopically guided placement of an infrarenal retrievable IVC filter. PLAN: Due to patient related comorbidities and/or clinical necessity, this IVC filter should be considered a permanent device. This patient will not be actively followed for future filter retrieval. Electronically Signed   By: Sandi Mariscal M.D.   On: 09/12/2020 07:02   IR Fluoro Guide CV Line Right  Result Date: 09/10/2020 INDICATION: 55 year old woman with massive pulmonary artery embolism presented with syncope and saddle PE. Interventional radiology consulted for pulmonary thrombectomy/thrombolysis. Patient unable to receive systemic tPA due to bleeding gastric mass. EXAM: 1. Ultrasound-guided access of right internal jugular vein 2. Central venous catheter placement 3. Ultrasound-guided access of right common femoral vein 4. Bilateral pulmonary angiogram and mechanical thrombectomy COMPARISON:  CT angiography chest 06/12/2020 MEDICATIONS: None ANESTHESIA/SEDATION: Sedation performed and monitored by the anesthesia team FLUOROSCOPY TIME:  Fluoroscopy Time: 20 minutes 48 seconds (83 mGy). COMPLICATIONS: None immediate. TECHNIQUE: Informed written consent was obtained from the patient after a thorough discussion of the procedural risks, benefits and alternatives. All questions were addressed. Maximal Sterile Barrier Technique was utilized including caps, mask, sterile gowns, sterile gloves,  sterile drape, hand hygiene and skin antiseptic. A timeout was performed prior to the initiation of the procedure. Patient positioned supine on the procedure table. Right neck and groin skin prepped and draped in usual fashion. The right internal jugular vein was evaluated with ultrasound and shown to be patent. A permanent ultrasound image was obtained and placed in the patient's medical record. Using sterile gel and a sterile probe cover, the right internal jugular vein was entered with a 21 ga needle during real time ultrasound guidance. 0.018 inch guidewire placed and 21 ga needle exchanged for transitional dilator set. Utilizing fluoroscopy, 0.035 inch guidewire advanced through the needle without difficulty. Serial dilation performed, and catheter inserted over the guidewire. The tip was positioned in the right atrium. All lumens of the catheter aspirated and flushed well. The catheter was secured to the skin with suture. The insertion site was covered with a Biopatch and sterile dressing. The right common femoral vein was evaluated with ultrasound and shown to be patent. A permanent ultrasound image was obtained and placed in the patient's medical record. Using sterile gel and a sterile probe cover the right common femoral vein was entered with a 21 gauge needle during real time ultrasound guidance. 21 gauge needle exchanged for transitional dilator set over 0.018 in guidewire. Transitional dilator set exchanged for 6 French sheath over 0.035 in guidewire. 6 Fr MIV double angle pigtail catheter advanced to the main pulmonary artery utilizing fluoroscopic guidance. Pre thrombectomy pulmonary artery pressure: 39/11 mmHg (mean 26 mmHg) Double angled pigtail catheter removed over exchange length Amplatz guidewire. Vert catheter utilized to advance Amplatz guidewire into right lower lobe pulmonary artery branch. Serial dilation was performed and 6 [place] sheath exchanged for [employer] 24 French sheath. 36  [place] FlowTriver device was advanced over the wire into the lateral basilar segmental and Truncus Anterior branches of the right pulmonary arterial system. Mechanical aspiration thrombectomy was performed extirpating thrombus. Post thrombectomy pulmonary angiogram demonstrated significant reduction in overall clot burden in the right pulmonary arterial branches. The left lateral  basilar segmental branch of the lower lobe artery was selected with the [facility] catheter. The 20 [place] FlowTriver device was advanced through the 24 French device over the wire into the lateral basilar segmental branch of the left pulmonary arterial system. Mechanical aspiration thrombectomy was performed extirpating thrombus. Post aspiration pulmonary angiogram demonstrated significant reduction in overall clot burden. Post thrombectomy Pulmonary artery pressure: 27/7 mmHg (mean 15 mmHg) FlowTriever device was removed. The [place] Dry Seal sheath was removed and hemostasis achieved with purse-string suture and manual compression. IMPRESSION: 1. Bilateral pulmonary angiogram and mechanical thrombectomy. Mean pulmonary artery pressure decreased from 26 mm Hg to 15 mm Hg following thrombectomy. 2. Right IJ non tunneled central line placement. Electronically Signed   By: Miachel Roux M.D.   On: 09/10/2020 12:35   IR THROMBECT PRIM MECH INIT (INCLU) MOD SED  Result Date: 09/10/2020 INDICATION: 55 year old woman with massive pulmonary artery embolism presented with syncope and saddle PE. Interventional radiology consulted for pulmonary thrombectomy/thrombolysis. Patient unable to receive systemic tPA due to bleeding gastric mass. EXAM: 1. Ultrasound-guided access of right internal jugular vein 2. Central venous catheter placement 3. Ultrasound-guided access of right common femoral vein 4. Bilateral pulmonary angiogram and mechanical thrombectomy COMPARISON:  CT angiography chest 06/12/2020 MEDICATIONS: None ANESTHESIA/SEDATION:  Sedation performed and monitored by the anesthesia team FLUOROSCOPY TIME:  Fluoroscopy Time: 20 minutes 48 seconds (83 mGy). COMPLICATIONS: None immediate. TECHNIQUE: Informed written consent was obtained from the patient after a thorough discussion of the procedural risks, benefits and alternatives. All questions were addressed. Maximal Sterile Barrier Technique was utilized including caps, mask, sterile gowns, sterile gloves, sterile drape, hand hygiene and skin antiseptic. A timeout was performed prior to the initiation of the procedure. Patient positioned supine on the procedure table. Right neck and groin skin prepped and draped in usual fashion. The right internal jugular vein was evaluated with ultrasound and shown to be patent. A permanent ultrasound image was obtained and placed in the patient's medical record. Using sterile gel and a sterile probe cover, the right internal jugular vein was entered with a 21 ga needle during real time ultrasound guidance. 0.018 inch guidewire placed and 21 ga needle exchanged for transitional dilator set. Utilizing fluoroscopy, 0.035 inch guidewire advanced through the needle without difficulty. Serial dilation performed, and catheter inserted over the guidewire. The tip was positioned in the right atrium. All lumens of the catheter aspirated and flushed well. The catheter was secured to the skin with suture. The insertion site was covered with a Biopatch and sterile dressing. The right common femoral vein was evaluated with ultrasound and shown to be patent. A permanent ultrasound image was obtained and placed in the patient's medical record. Using sterile gel and a sterile probe cover the right common femoral vein was entered with a 21 gauge needle during real time ultrasound guidance. 21 gauge needle exchanged for transitional dilator set over 0.018 in guidewire. Transitional dilator set exchanged for 6 French sheath over 0.035 in guidewire. 6 Fr MIV double angle pigtail  catheter advanced to the main pulmonary artery utilizing fluoroscopic guidance. Pre thrombectomy pulmonary artery pressure: 39/11 mmHg (mean 26 mmHg) Double angled pigtail catheter removed over exchange length Amplatz guidewire. Vert catheter utilized to advance Amplatz guidewire into right lower lobe pulmonary artery branch. Serial dilation was performed and 6 [place] sheath exchanged for [employer] 24 French sheath. 70 [place] FlowTriver device was advanced over the wire into the lateral basilar segmental and Truncus Anterior branches of the right  pulmonary arterial system. Mechanical aspiration thrombectomy was performed extirpating thrombus. Post thrombectomy pulmonary angiogram demonstrated significant reduction in overall clot burden in the right pulmonary arterial branches. The left lateral basilar segmental branch of the lower lobe artery was selected with the [facility] catheter. The 20 [place] FlowTriver device was advanced through the 24 French device over the wire into the lateral basilar segmental branch of the left pulmonary arterial system. Mechanical aspiration thrombectomy was performed extirpating thrombus. Post aspiration pulmonary angiogram demonstrated significant reduction in overall clot burden. Post thrombectomy Pulmonary artery pressure: 27/7 mmHg (mean 15 mmHg) FlowTriever device was removed. The Gore Dry Seal sheath was removed and hemostasis achieved with purse-string suture and manual compression. IMPRESSION: 1. Bilateral pulmonary angiogram and mechanical thrombectomy. Mean pulmonary artery pressure decreased from 26 mm Hg to 15 mm Hg following thrombectomy. 2. Right IJ non tunneled central line placement. Electronically Signed   By: Miachel Roux M.D.   On: 09/10/2020 12:35   IR US Guide Vasc Access Right  Result Date: 09/10/2020 INDICATION: 55 year old woman with massive pulmonary artery embolism presented with syncope and saddle PE. Interventional radiology consulted for  pulmonary thrombectomy/thrombolysis. Patient unable to receive systemic tPA due to bleeding gastric mass. EXAM: 1. Ultrasound-guided access of right internal jugular vein 2. Central venous catheter placement 3. Ultrasound-guided access of right common femoral vein 4. Bilateral pulmonary angiogram and mechanical thrombectomy COMPARISON:  CT angiography chest 06/12/2020 MEDICATIONS: None ANESTHESIA/SEDATION: Sedation performed and monitored by the anesthesia team FLUOROSCOPY TIME:  Fluoroscopy Time: 20 minutes 48 seconds (83 mGy). COMPLICATIONS: None immediate. TECHNIQUE: Informed written consent was obtained from the patient after a thorough discussion of the procedural risks, benefits and alternatives. All questions were addressed. Maximal Sterile Barrier Technique was utilized including caps, mask, sterile gowns, sterile gloves, sterile drape, hand hygiene and skin antiseptic. A timeout was performed prior to the initiation of the procedure. Patient positioned supine on the procedure table. Right neck and groin skin prepped and draped in usual fashion. The right internal jugular vein was evaluated with ultrasound and shown to be patent. A permanent ultrasound image was obtained and placed in the patient's medical record. Using sterile gel and a sterile probe cover, the right internal jugular vein was entered with a 21 ga needle during real time ultrasound guidance. 0.018 inch guidewire placed and 21 ga needle exchanged for transitional dilator set. Utilizing fluoroscopy, 0.035 inch guidewire advanced through the needle without difficulty. Serial dilation performed, and catheter inserted over the guidewire. The tip was positioned in the right atrium. All lumens of the catheter aspirated and flushed well. The catheter was secured to the skin with suture. The insertion site was covered with a Biopatch and sterile dressing. The right common femoral vein was evaluated with ultrasound and shown to be patent. A permanent  ultrasound image was obtained and placed in the patient's medical record. Using sterile gel and a sterile probe cover the right common femoral vein was entered with a 21 gauge needle during real time ultrasound guidance. 21 gauge needle exchanged for transitional dilator set over 0.018 in guidewire. Transitional dilator set exchanged for 6 French sheath over 0.035 in guidewire. 6 Fr MIV double angle pigtail catheter advanced to the main pulmonary artery utilizing fluoroscopic guidance. Pre thrombectomy pulmonary artery pressure: 39/11 mmHg (mean 26 mmHg) Double angled pigtail catheter removed over exchange length Amplatz guidewire. Vert catheter utilized to advance Amplatz guidewire into right lower lobe pulmonary artery branch. Serial dilation was performed and 6 [place] sheath exchanged for  [place] [employer] Seal 24 French sheath. 52 [place] FlowTriver device was advanced over the wire into the lateral basilar segmental and Truncus Anterior branches of the right pulmonary arterial system. Mechanical aspiration thrombectomy was performed extirpating thrombus. Post thrombectomy pulmonary angiogram demonstrated significant reduction in overall clot burden in the right pulmonary arterial branches. The left lateral basilar segmental branch of the lower lobe artery was selected with the [facility] catheter. The 20 [place] FlowTriver device was advanced through the 24 French device over the wire into the lateral basilar segmental branch of the left pulmonary arterial system. Mechanical aspiration thrombectomy was performed extirpating thrombus. Post aspiration pulmonary angiogram demonstrated significant reduction in overall clot burden. Post thrombectomy Pulmonary artery pressure: 27/7 mmHg (mean 15 mmHg) FlowTriever device was removed. The [place] Dry Seal sheath was removed and hemostasis achieved with purse-string suture and manual compression. IMPRESSION: 1. Bilateral pulmonary angiogram and mechanical thrombectomy. Mean  pulmonary artery pressure decreased from 26 mm Hg to 15 mm Hg following thrombectomy. 2. Right IJ non tunneled central line placement. Electronically Signed   By: Miachel Roux M.D.   On: 09/10/2020 12:35   IR US Guide Vasc Access Right  Result Date: 09/10/2020 INDICATION: 55 year old woman with massive pulmonary artery embolism presented with syncope and saddle PE. Interventional radiology consulted for pulmonary thrombectomy/thrombolysis. Patient unable to receive systemic tPA due to bleeding gastric mass. EXAM: 1. Ultrasound-guided access of right internal jugular vein 2. Central venous catheter placement 3. Ultrasound-guided access of right common femoral vein 4. Bilateral pulmonary angiogram and mechanical thrombectomy COMPARISON:  CT angiography chest 06/12/2020 MEDICATIONS: None ANESTHESIA/SEDATION: Sedation performed and monitored by the anesthesia team FLUOROSCOPY TIME:  Fluoroscopy Time: 20 minutes 48 seconds (83 mGy). COMPLICATIONS: None immediate. TECHNIQUE: Informed written consent was obtained from the patient after a thorough discussion of the procedural risks, benefits and alternatives. All questions were addressed. Maximal Sterile Barrier Technique was utilized including caps, mask, sterile gowns, sterile gloves, sterile drape, hand hygiene and skin antiseptic. A timeout was performed prior to the initiation of the procedure. Patient positioned supine on the procedure table. Right neck and groin skin prepped and draped in usual fashion. The right internal jugular vein was evaluated with ultrasound and shown to be patent. A permanent ultrasound image was obtained and placed in the patient's medical record. Using sterile gel and a sterile probe cover, the right internal jugular vein was entered with a 21 ga needle during real time ultrasound guidance. 0.018 inch guidewire placed and 21 ga needle exchanged for transitional dilator set. Utilizing fluoroscopy, 0.035 inch guidewire advanced through the  needle without difficulty. Serial dilation performed, and catheter inserted over the guidewire. The tip was positioned in the right atrium. All lumens of the catheter aspirated and flushed well. The catheter was secured to the skin with suture. The insertion site was covered with a Biopatch and sterile dressing. The right common femoral vein was evaluated with ultrasound and shown to be patent. A permanent ultrasound image was obtained and placed in the patient's medical record. Using sterile gel and a sterile probe cover the right common femoral vein was entered with a 21 gauge needle during real time ultrasound guidance. 21 gauge needle exchanged for transitional dilator set over 0.018 in guidewire. Transitional dilator set exchanged for 6 French sheath over 0.035 in guidewire. 6 Fr MIV double angle pigtail catheter advanced to the main pulmonary artery utilizing fluoroscopic guidance. Pre thrombectomy pulmonary artery pressure: 39/11 mmHg (mean 26 mmHg) Double angled pigtail catheter removed over  exchange length Amplatz guidewire. Vert catheter utilized to advance Amplatz guidewire into right lower lobe pulmonary artery branch. Serial dilation was performed and 6 [place] sheath exchanged for [employer] 24 French sheath. 50 [place] FlowTriver device was advanced over the wire into the lateral basilar segmental and Truncus Anterior branches of the right pulmonary arterial system. Mechanical aspiration thrombectomy was performed extirpating thrombus. Post thrombectomy pulmonary angiogram demonstrated significant reduction in overall clot burden in the right pulmonary arterial branches. The left lateral basilar segmental branch of the lower lobe artery was selected with the [facility] catheter. The 20 [place] FlowTriver device was advanced through the 24 French device over the wire into the lateral basilar segmental branch of the left pulmonary arterial system. Mechanical aspiration thrombectomy was performed  extirpating thrombus. Post aspiration pulmonary angiogram demonstrated significant reduction in overall clot burden. Post thrombectomy Pulmonary artery pressure: 27/7 mmHg (mean 15 mmHg) FlowTriever device was removed. The [place] Dry Seal sheath was removed and hemostasis achieved with purse-string suture and manual compression. IMPRESSION: 1. Bilateral pulmonary angiogram and mechanical thrombectomy. Mean pulmonary artery pressure decreased from 26 mm Hg to 15 mm Hg following thrombectomy. 2. Right IJ non tunneled central line placement. Electronically Signed   By: Miachel Roux M.D.   On: 09/10/2020 12:35   DG Chest Port 1 View  Result Date: 09/08/2020 CLINICAL DATA:  Shortness of breath.  Syncopal episodes. EXAM: PORTABLE CHEST 1 VIEW COMPARISON:  09/07/2019 FINDINGS: Stable appearance of the left subclavian Port-A-Cath with the tip extending into the upper right atrium. There is a chronic kink in the port near the inferior aspect of the left clavicle. Lungs are clear. Heart size is normal. Trachea is midline. No acute bone abnormality. IMPRESSION: 1. No acute cardiopulmonary disease. 2. Stable appearance of the left subclavian port with a kink in the catheter near the left clavicle. 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 Electronically Signed   By: Markus Daft M.D.   On: 09/08/2020 13:00   ECHOCARDIOGRAM COMPLETE  Result Date: 09/09/2020    ECHOCARDIOGRAM REPORT   Patient Name:   Michelle Cain Date of Exam: 09/09/2020 Medical Rec #:  [id number]         Height:       64.0 in Accession #:    [id number]        Weight:       164.5 lb Date of Birth:  [birth year]         BSA:          1.800 m Patient Age:    55 years          BP:           91/62 mmHg Patient Gender: F                 HR:           114 bpm. Exam Location:  Inpatient Procedure: 2D Echo, Cardiac Doppler and Color Doppler Indications:    Elevated troponin  History:        Patient has no prior history of  Echocardiogram examinations.                 Signs/Symptoms:Syncope and Elevated Troponins. Gastric cancer,                 breast cancer, anemia, Chemo.  Sonographer:    Dustin Flock Referring Phys: [id number] [place]  1. There  is sinus tachycardia with severe RV dilation and moderately reduced RV function. 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  2. Left ventricular ejection fraction, by estimation, is 60 to 65%. The left ventricle has normal function. The left ventricle has no regional wall motion abnormalities. Indeterminate diastolic filling due to E-A fusion. There is the interventricular septum is flattened in systole and diastole, consistent with right ventricular pressure and volume overload.  3. Right ventricular systolic function is moderately reduced. The right ventricular size is severely enlarged. There is mildly elevated pulmonary artery systolic pressure. The estimated right ventricular systolic pressure is 45.3 mmHg.  4. The mitral valve is grossly normal. No evidence of mitral valve regurgitation. No evidence of mitral stenosis.  5. The aortic valve is tricuspid. Aortic valve regurgitation is not visualized. No aortic stenosis is present.  6. The inferior vena cava is normal in size with greater than 50% respiratory variability, suggesting right atrial pressure of 3 mmHg. FINDINGS  Left Ventricle: Left ventricular ejection fraction, by estimation, is 60 to 65%. The left ventricle has normal function. The left ventricle has no regional wall motion abnormalities. The left ventricular internal cavity size was normal in size. There is  no left ventricular hypertrophy. The interventricular septum is flattened in systole and diastole, consistent with right ventricular pressure and volume overload. Indeterminate diastolic filling due to E-A fusion. Right Ventricle: The right ventricular size is severely enlarged. No increase in right ventricular wall thickness. Right  ventricular systolic function is moderately reduced. There is mildly elevated pulmonary artery systolic pressure. The tricuspid regurgitant velocity is 2.93 m/s, and with an assumed right atrial pressure of 3 mmHg, the estimated right ventricular systolic pressure is 64.6 mmHg. Left Atrium: Left atrial size was normal in size. Right Atrium: Right atrial size was normal in size. Pericardium: Trivial pericardial effusion is present. Mitral Valve: The mitral valve is grossly normal. No evidence of mitral valve regurgitation. No evidence of mitral valve stenosis. Tricuspid Valve: The tricuspid valve is grossly normal. Tricuspid valve regurgitation is mild . No evidence of tricuspid stenosis. Aortic Valve: The aortic valve is tricuspid. Aortic valve regurgitation is not visualized. No aortic stenosis is present. Pulmonic Valve: The pulmonic valve was grossly normal. Pulmonic valve regurgitation is not visualized. No evidence of pulmonic stenosis. Aorta: The aortic root and ascending aorta are structurally normal, with no evidence of dilitation. Venous: The inferior vena cava is normal in size with greater than 50% respiratory variability, suggesting right atrial pressure of 3 mmHg. IAS/Shunts: The atrial septum is grossly normal. Additional Comments: A venous catheter is visualized in the right atrium.  LEFT VENTRICLE PLAX 2D LVIDd:         3.20 cm     Diastology LVIDs:         2.40 cm     LV e' medial:    8.05 cm/s LV PW:         1.10 cm     LV E/e' medial:  6.2 LV IVS:        1.20 cm     LV e' lateral:   7.62 cm/s LVOT diam:     1.80 cm     LV E/e' lateral: 6.5 LV SV:         23 LV SV Index:   13 LVOT Area:     2.54 cm  LV Volumes (MOD) LV vol d, MOD A4C: 47.5 ml LV vol s, MOD A4C: 20.7 ml LV SV MOD A4C:  47.5 ml RIGHT VENTRICLE RV Basal diam:  4.10 cm RV S prime:     8.27 cm/s TAPSE (M-mode): 1.6 cm LEFT ATRIUM             Index       RIGHT ATRIUM          Index LA diam:        2.30 cm 1.28 cm/m  RA Area:      9.61 cm LA Vol (A2C):   14.9 ml 8.28 ml/m  RA Volume:   21.80 ml 12.11 ml/m LA Vol (A4C):   25.8 ml 14.33 ml/m LA Biplane Vol: 21.4 ml 11.89 ml/m  AORTIC VALVE LVOT Vmax:   81.70 cm/s LVOT Vmean:  51.100 cm/s LVOT VTI:    0.092 m  AORTA Ao Root diam: 2.60 cm MITRAL VALVE               TRICUSPID VALVE MV Area (PHT): 6.22 cm    TR Peak grad:   34.3 mmHg MV Decel Time: 122 msec    TR Vmax:        293.00 cm/s MV E velocity: 49.80 cm/s MV A velocity: 78.20 cm/s  SHUNTS MV E/A ratio:  0.64        Systemic VTI:  0.09 m                            Systemic Diam: 1.80 cm Eleonore Chiquito MD Electronically signed by Eleonore Chiquito MD Signature Date/Time: 09/09/2020/12:04:31 PM    Final    VAS [place] LOWER EXTREMITY VENOUS (DVT)  Result Date: 09/09/2020  Lower Venous DVT Study Patient Name:  Michelle Cain  Date of Exam:   09/09/2020 Medical Rec #: [id number]          Accession #:    [id number] Date of Birth: 01-16-1966          Patient Gender: F Patient Age:   89Y Exam Location:  [facility] Procedure:      VAS [place] LOWER EXTREMITY VENOUS (DVT) Referring Phys: [id number] Sil[address] [facility] --------------------------------------------------------------------------------  Indications: Pulmonary embolism.  Comparison Study: no prior Performing Technologist: Abram Sander RVS  Examination Guidelines: A complete evaluation includes B-mode imaging, spectral Doppler, color Doppler, and power Doppler as needed of all accessible portions of each vessel. Bilateral testing is considered an integral part of a complete examination. Limited examinations for reoccurring indications may be performed as noted. The reflux portion of the exam is performed with the patient in reverse Trendelenburg.  +---------+---------------+---------+-----------+----------+--------------+ RIGHT    CompressibilityPhasicitySpontaneityPropertiesThrombus Aging +---------+---------------+---------+-----------+----------+--------------+ CFV      Full            Yes      Yes                                 +---------+---------------+---------+-----------+----------+--------------+ SFJ      Full                                                        +---------+---------------+---------+-----------+----------+--------------+ FV Prox  Full                                                        +---------+---------------+---------+-----------+----------+--------------+  FV Mid   Full                                                        +---------+---------------+---------+-----------+----------+--------------+ FV DistalFull                                                        +---------+---------------+---------+-----------+----------+--------------+ PFV      Full                                                        +---------+---------------+---------+-----------+----------+--------------+ POP      Full           Yes      Yes                                 +---------+---------------+---------+-----------+----------+--------------+ PTV      Full                                                        +---------+---------------+---------+-----------+----------+--------------+ PERO     Full                                                        +---------+---------------+---------+-----------+----------+--------------+   +---------+---------------+---------+-----------+----------+-----------------+ LEFT     CompressibilityPhasicitySpontaneityPropertiesThrombus Aging    +---------+---------------+---------+-----------+----------+-----------------+ CFV      Full           Yes      Yes                                    +---------+---------------+---------+-----------+----------+-----------------+ SFJ      Full                                                           +---------+---------------+---------+-----------+----------+-----------------+ FV Prox  Full                                                            +---------+---------------+---------+-----------+----------+-----------------+ FV Mid   Full                                                           +---------+---------------+---------+-----------+----------+-----------------+  FV DistalFull                                                           +---------+---------------+---------+-----------+----------+-----------------+ PFV      Full                                                           +---------+---------------+---------+-----------+----------+-----------------+ POP      None           Yes      Yes                  Age Indeterminate +---------+---------------+---------+-----------+----------+-----------------+ PTV      None                                         Age Indeterminate +---------+---------------+---------+-----------+----------+-----------------+ PERO     None                                         Age Indeterminate +---------+---------------+---------+-----------+----------+-----------------+     Summary: RIGHT: - There is no evidence of deep vein thrombosis in the lower extremity.  - No cystic structure found in the popliteal fossa.  LEFT: - Findings consistent with age indeterminate deep vein thrombosis involving the left popliteal vein, left posterior tibial veins, and left peroneal veins. - No cystic structure found in the popliteal fossa.  *See table(s) above for measurements and observations. Electronically signed by Servando Snare MD on 09/09/2020 at 4:03:54 PM.    Final    VAS [place] UPPER EXTREMITY VENOUS DUPLEX  Result Date: 09/10/2020 UPPER VENOUS STUDY  Patient Name:  Michelle Cain  Date of Exam:   09/10/2020 Medical Rec #: [id number]          Accession #:    [id number] Date of Birth: Apr 12, 1966          Patient Gender: F Patient Age:   13Y Exam Location:  [facility] Procedure:      VAS [place] UPPER EXTREMITY VENOUS DUPLEX Referring Phys: [id number] [place] --------------------------------------------------------------------------------  Indications: Edema Limitations: Poor ultrasound/tissue interface, bandages and line. Comparison Study: No prior study Performing Technologist: Maudry Mayhew MHA, RDMS, RVT, RDCS  Examination Guidelines: A complete evaluation includes B-mode imaging, spectral Doppler, color Doppler, and power Doppler as needed of all accessible portions of each vessel. Bilateral testing is considered an integral part of a complete examination. Limited examinations for reoccurring indications may be performed as noted.  Right Findings: +----------+------------+---------+-----------+--------------+-----------------+ RIGHT     CompressiblePhasicitySpontaneous  Properties       Summary      +----------+------------+---------+-----------+--------------+-----------------+ IJV                      Yes       Yes                                    +----------+------------+---------+-----------+--------------+-----------------+  Subclavian    None                 No                         Acute       +----------+------------+---------+-----------+--------------+-----------------+ Axillary      None                 Yes      partially   Age Indeterminate                                           re-cannulated                   +----------+------------+---------+-----------+--------------+-----------------+ Brachial      Full                                                        +----------+------------+---------+-----------+--------------+-----------------+ Radial        Full                                                        +----------+------------+---------+-----------+--------------+-----------------+ Ulnar         Full                                                        +----------+------------+---------+-----------+--------------+-----------------+ Cephalic      Full                                                         +----------+------------+---------+-----------+--------------+-----------------+ Basilic       Full                                                        +----------+------------+---------+-----------+--------------+-----------------+  Left Findings: +----------+------------+---------+-----------+----------+-------+ LEFT      CompressiblePhasicitySpontaneousPropertiesSummary +----------+------------+---------+-----------+----------+-------+ Subclavian               Yes       Yes                      +----------+------------+---------+-----------+----------+-------+  Summary:  Right: Findings consistent with acute deep vein thrombosis involving the right subclavian vein. Findings consistent with age indeterminate deep vein thrombosis involving the right axillary vein.  Left: No evidence of thrombosis in the subclavian.  *See table(s) above for measurements and observations.  Diagnosing physician: Monica Martinez MD Electronically signed by Monica Martinez MD on 09/10/2020 at 4:32:26 PM.    Final  ASSESSMENT AND PLAN: 1. Gastric cancer ? Presenting with dysphagia spring 2019 ? 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 CTs revealed wall thickening and luminal narrowing of the colon at the hepatic flexure and cecum ? PET scan with no evidence of distant metastatic disease or abnormal uptake other than thickening of the distal esophagus and proximal stomach ? 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 CTs 10/23/2017-diffuse prominence of the gastric wall, especially the antrum, focal wall thickening of the distal ascending colon and hepatic flexure, thickening of the cecum, nonspecific haziness of the mesenteric fat in the pelvis, mild prominence of lymph nodes at the greater curvature of the stomach ? Treatment with FOLFOX beginning July 2019 ? CTs October 2019-stable disease, FOLFOX continued ? CTs December 2019-stable disease ? January 2020 treatment transition to Xeloda maintenance, care transition to [facility] ? CTs in June 2020 in September 2020-stable disease, Xeloda continued ? CTs 06/23/2019-nonspecific thickening of the GE junction, intraluminal bladder mass ? Cystoscopy-metastatic signet ring cell adenocarcinoma ? Cycle 1 day 1 ramucirumab/paclitaxel 08/19/2019(given atanother facility) ? Cycle 1 day 1 ramucirumab/paclitaxel 09/04/2019 ? Cycle 1 day 8 Taxol 09/10/19 ? Cycle 1 day 15 ramucirumab/paclitaxel 6/2/2021canceled d/t neutropenia ? Ramucirumab/pactlitaxelevery 2 weeks 09/24/2019 ? Ramucirumab/paclitaxel 10/08/2019 ? Ramucirumab/paclitaxel 10/22/2019 ? Ramucirumab/paclitaxel 11/05/2019 ? CTs 11/24/2019-unchanged thickening of the distal esophagus/upper stomach, gastric body and antrum. Unchanged thickening of the transverse colon at the hepatic flexure, unchanged thickening of the urinary bladder, no evidence of progressive disease ? Ramucirumab/paclitaxel 11/25/2019 ? Ramucirumab/paclitaxel 12/09/2019 ? Ramucirumab/paclitaxel 12/23/2019 ? Ramucirumab/paclitaxel9/21/2021 ? Ramucirumab/paclitaxel 01/20/2020 ? Ramucirumab/paclitaxel 02/04/2020 ? Ramucirumab/paclitaxel 02/17/2020 ? Ramucirumab/paclitaxel 03/02/2020 ? Ramucirumab/paclitaxel 03/16/2020 ? CTs  03/28/2020-stable thickening of the distal esophagus, GE junction, gastric body, and antrum. Decreased soft tissue thickening of the transverse colon at the hepatic flexure, no evidence of metastatic disease to the chest ? Ramucirumab/paclitaxel 03/30/2020 ? SBRT to the stomach 04/12/2020-05/06/2020, 37.5 Gray in 15 fractions ? Ramucirumab/paclitaxel 04/05/2020 ? Ramucirumab/paclitaxel 04/13/2020 ? Ramucirumab/paclitaxel 04/27/2020 ? Ramucirumab/paclitaxel 05/25/2020 ? Ramucirumab/paclitaxel 06/11/2020 ? Ramucirumab/paclitaxel 06/22/2020 ? Ramucirumab/paclitaxel 07/06/2020 ? Ramucirumab/paclitaxel 07/20/2020 ? CT abdomen/pelvis 08/03/2020-wall thickening at the distal esophagus and stomach body/antrum-progressive, indistinct stranding around the descending duodenum, porta hepatis, and retroperitoneum-stable, abnormal wall thickening at the left upper urinary bladder-stable ? Ramucirumab/paclitaxel 08/11/2020 ? Upper endoscopy 08/26/2020- gastric mucosa nearly completely replaced with cancer.  Most proximal aspect of the stomach was spared but starting at the proximal gastric body there was extensive, circumferential cancer that continued into the proximal duodenal bulb.  The tumor is ulcerated in some areas, edematous throughout, very friable with spontaneous oozing.  The lumen of the distal stomach was narrowed due to edematous malignant changes but this did not limit scope passage.  2.Left breast cancer 2008,pT1c,pN0, status post a left lumpectomy with adjuvant chemotherapy and radiation, ER positive, PR positive, HER-2 positive  3.Mixed connective tissue disease/SLE  4.Lower extremity deep vein thrombosis maintained on apixaban-placed on hold due to hematemesis, hematuria 08/19/2020.  See findings of upper endoscopy from 08/26/2020.  Dr. Ardis Hughs recommends against ever resuming a blood thinner.  5.Family history of multiple cancers including breast and ovarian cancer  6.Dysphagia and  intermittent vomiting secondary to #1  7.Hypertension  8.Peripheral neuropathy  9.Masslike fullness at the posterior right parotid/angle of the jaw  10. Dyspnea on exertion, ongoing for about a month, worsened after taxol/ramucirumab on 09/04/19 requiring ED visit on 5/23. CBC, CMP, troponin, BNP and chest xray negative  11.Anemia likely secondary to combination of chemotherapy and blood loss  2 units packed red blood cells 04/12/2020, 07/20/2020 12.Admission 05/11/2020 with pneumonia, CT chest revealed left upper and lower lobe opacities 13. Progressive abdominal pain beginning 07/29/2020-etiology unclear, likely related to progression of tumor in the stomach 14.  Massive PE with cor pulmonale May 2022 15.  Hospital admission 09/08/2020 massive syncope, PE, symptomatic anemia.  Bilateral pulmonary angiogram and mechanical thrombectomy 09/09/2020  IVC filter placement 09/12/2020  Ms. Bracey-Brady appears stable.  The hemoglobin dropped yesterday and she was transfused 2 units of packed red blood cells.  The anemia has improved today.  No apparent gross bleeding.  She continues heparin anticoagulation.  I discussed the difficult circumstance of both clotting and bleeding with her again this morning.  I recommend a trial of Lovenox anticoagulation.  She says that she can administer home Lovenox.  The plan will be to discontinue anticoagulation if she has recurrent bleeding.   Recommendations: 1.  Convert to twice daily Lovenox anticoagulation later today or on 09/14/2020 if she remains without gross bleeding 2.  I will contact my GI oncology colleagues at [facility] to request a telehealth visit 3.  Outpatient follow-up will be scheduled at the Cancer center.    LOS: 5 days   Betsy Coder, MD 09/13/20

## 2020-09-13 NOTE — Progress Notes (Signed)
ANTICOAGULATION CONSULT NOTE - Follow Up Consult  Pharmacy Consult for heparin Indication: pulmonary embolus and LLE + RUE DVTs  Labs: Recent Labs    09/10/20 0500 09/10/20 0938 09/11/20 0500 09/11/20 0530 09/11/20 2306 09/12/20 0731 09/12/20 1130 09/13/20 0127  HGB  --    < > 7.5*  --   --  7.0* 6.0* 10.2*  HCT  --    < > 23.7*  --   --  22.4* 19.8* 31.2*  PLT  --    < > 138*  --   --  126* 135* 128*  APTT 47*  --   --   --   --   --   --   --   HEPARINUNFRC <0.10*   < >  --    < > >1.10* 0.29*  --  0.18*  CREATININE  --   --  0.59  --   --  0.57  --   --    < > = values in this interval not displayed.    Assessment: 55yo female now subtherapeutic on heparin after one level just under goal, unchanged while receiving transfusion; no gtt issues or signs of bleeding per RN.  Goal of Therapy:  Heparin level 0.3-0.5 units/ml   Plan:  Will increase heparin gtt slightly to 600 units/hr and check level in 6 hours.      Wynona Neat, PharmD, BCPS  09/13/2020,3:19 AM

## 2020-09-14 ENCOUNTER — Telehealth: Payer: Self-pay | Admitting: *Deleted

## 2020-09-14 ENCOUNTER — Other Ambulatory Visit (HOSPITAL_COMMUNITY): Payer: Self-pay

## 2020-09-14 ENCOUNTER — Encounter: Payer: Self-pay | Admitting: Oncology

## 2020-09-14 ENCOUNTER — Encounter: Payer: Self-pay | Admitting: Dietician

## 2020-09-14 DIAGNOSIS — K921 Melena: Secondary | ICD-10-CM | POA: Diagnosis not present

## 2020-09-14 DIAGNOSIS — I248 Other forms of acute ischemic heart disease: Secondary | ICD-10-CM | POA: Diagnosis not present

## 2020-09-14 DIAGNOSIS — I2699 Other pulmonary embolism without acute cor pulmonale: Secondary | ICD-10-CM | POA: Diagnosis not present

## 2020-09-14 DIAGNOSIS — C168 Malignant neoplasm of overlapping sites of stomach: Secondary | ICD-10-CM | POA: Diagnosis not present

## 2020-09-14 DIAGNOSIS — D500 Iron deficiency anemia secondary to blood loss (chronic): Secondary | ICD-10-CM | POA: Diagnosis not present

## 2020-09-14 DIAGNOSIS — D649 Anemia, unspecified: Secondary | ICD-10-CM | POA: Diagnosis not present

## 2020-09-14 LAB — CBC
HCT: 27.5 % — ABNORMAL LOW (ref 36.0–46.0)
Hemoglobin: 8.9 g/dL — ABNORMAL LOW (ref 12.0–15.0)
MCH: 30.1 pg (ref 26.0–34.0)
MCHC: 32.4 g/dL (ref 30.0–36.0)
MCV: 92.9 fL (ref 80.0–100.0)
Platelets: 135 10*3/uL — ABNORMAL LOW (ref 150–400)
RBC: 2.96 MIL/uL — ABNORMAL LOW (ref 3.87–5.11)
RDW: 18.5 % — ABNORMAL HIGH (ref 11.5–15.5)
WBC: 6.7 10*3/uL (ref 4.0–10.5)
nRBC: 2.5 % — ABNORMAL HIGH (ref 0.0–0.2)

## 2020-09-14 LAB — HEPARIN LEVEL (UNFRACTIONATED): Heparin Unfractionated: 0.28 IU/mL — ABNORMAL LOW (ref 0.30–0.70)

## 2020-09-14 MED ORDER — OXYCODONE HCL 5 MG PO TABS
5.0000 mg | ORAL_TABLET | Freq: Four times a day (QID) | ORAL | 0 refills | Status: DC | PRN
  Filled 2020-09-14: qty 20, 5d supply, fill #0

## 2020-09-14 MED ORDER — ENOXAPARIN SODIUM 80 MG/0.8ML IJ SOSY
80.0000 mg | PREFILLED_SYRINGE | Freq: Every day | INTRAMUSCULAR | 0 refills | Status: DC
  Filled 2020-09-14: qty 24, 30d supply, fill #0

## 2020-09-14 MED ORDER — DRONABINOL 2.5 MG PO CAPS
2.5000 mg | ORAL_CAPSULE | Freq: Two times a day (BID) | ORAL | 0 refills | Status: DC
  Filled 2020-09-14: qty 60, 30d supply, fill #0

## 2020-09-14 MED ORDER — ENOXAPARIN SODIUM 80 MG/0.8ML IJ SOSY
80.0000 mg | PREFILLED_SYRINGE | Freq: Every day | INTRAMUSCULAR | Status: DC
  Administered 2020-09-14: 80 mg via SUBCUTANEOUS
  Filled 2020-09-14: qty 0.8

## 2020-09-14 MED ORDER — HEPARIN SOD (PORK) LOCK FLUSH 100 UNIT/ML IV SOLN
500.0000 [IU] | INTRAVENOUS | Status: AC | PRN
  Administered 2020-09-14: 500 [IU]
  Filled 2020-09-14: qty 5

## 2020-09-14 MED ORDER — SUCRALFATE 1 GM/10ML PO SUSP
1.0000 g | Freq: Three times a day (TID) | ORAL | 0 refills | Status: DC
  Filled 2020-09-14: qty 1200, 30d supply, fill #0

## 2020-09-14 MED ORDER — PANTOPRAZOLE SODIUM 40 MG PO TBEC
40.0000 mg | DELAYED_RELEASE_TABLET | Freq: Two times a day (BID) | ORAL | 0 refills | Status: DC
  Filled 2020-09-14: qty 60, 30d supply, fill #0

## 2020-09-14 NOTE — Discharge Summary (Signed)
PATIENT DETAILS Name: Michelle Cain Age: 55 y.o. Sex: female Date of Birth: 1966/03/14 MRN: [id number]. Admitting Physician: Jonnie Finner, DO WUJ:[id number], [name], DO  Admit Date: 09/08/2020 Discharge date: 09/14/2020  Recommendations for Outpatient Follow-up:  1. Follow up with PCP in 1-2 weeks 2. Please obtain CMP/CBC in one week  Admitted From:  Home  Disposition: Home with home health [place]:  Yes  Equipment/Devices: None  Discharge Condition: Stable  CODE STATUS: FULL CODE  Diet recommendation:  Diet Order            Diet general           Diet regular Room service appropriate? Yes; Fluid consistency: Thin  Diet effective now                  Brief Narrative: Patient is a 55 y.o. female with gastric cancer-on chemotherapy as an outpatient-(last EGD on 5/12 showed entire gastric mucosa replaced by malignancy)-history of lower extremity DVT-Eliquis on hold since EGD on 5/12-presented to the hospital on 5/25 with a syncopal episode and acute blood loss anemia due to low-grade GI bleeding from known gastric cancer.   Further work-up revealed massive PE with acute cor pulmonale-requiring transfer to the ICU-and mechanical thrombectomy.  See below for further details.  Significant events: 5/25>> admit to [facility] for syncope-symptomatic anemia-having melanotic stools. 5/26>> diagnosed with massive PE-acute cor pulmonale on echo-to [facility] for thrombectomy 5/27>> bilateral pulmonary artery thrombectomy by IR 5/29>> infra renal IVC filter placed by IR 5/29>> transferred to [facility] from ICU  Significant studies: 5/12>> EGD: Gastric mucosa completely replaced by cancer-tumor ulcerated in areas-with spontaneous oozing 5/26>> VQ scan: Consistent with PE 5/26>> CTA chest: Acute PE with CT evidence of right heart strain 5/26>> Echo EF 29-56%, RV systolic function moderately reduced 5/26>> bilateral lower extremity Doppler: DVT in left  popliteal/posterior tibial/peroneal vein 5/27>> bilateral upper extremity DVT: DVT in right subclavian and axillary vein  Antimicrobial therapy: Cefepime: 5/25>> 5/26 Vancomycin: 5/25 x 1  Microbiology data: 5/25>> blood culture: No growth 5/25>> COVID PCR: Negative  Procedures : 5/27>> bilateral pulmonary artery thrombectomy by IR 5/29>> infra renal IVC filter placed by IR  Consults: Cardiology, PCCM, IR, oncology, palliative care  Brief Hospital Course: Acute massive pulmonary embolism-s/p thrombectomy by IR on 5/27:  Tolerated IV heparin relatively well-no gross/overt bleeding-she underwent thrombectomy during this hospital stay.  After discussion with her primary oncologist-she was switched to subcutaneous Lovenox-with plans to discharge-oncology will plan to follow-up her up in the office tomorrow.    Nursing staff will ensure Lovenox administration education prior to discharge.  History of left lower extremity DVT-s/p IVC filter on 5/29: Previously on anticoagulation-Per chart review-this was discontinued following EGD findings on 5/12.    Now on Lovenox.  Right subclavian/axillary vein and DVT: Difficult situation-currently on IV heparin.  Discussed with vascular surgery-Dr. Carlis Abbott on 5/29-no good options for surgical treatment-not a candidate for lytic therapy given EGD findings-does not think vena cava filter in the SVC is possible.  Recommends compression and elevation of affected extremity.  Low-grade GI bleeding with acute blood loss anemia due to gastric cancer: EGD findings on 5/12 reviewed-GI input reviewed-very difficult situation-especially given significant clot burden and need for anticoagulation.  Transfused 2 units of PRBC on 5/29 (total of 3 units so far).  No overt/gross GI bleeding apparent-patient denies any hematemesis for almost 48 hours-she has not had any melanotic stools for 2-3 days.  Since hemoglobin relatively stable-plans  are to transition to subcu  Lovenox today and discharged home.  She will be maintained on PPI and Carafate.  If she develops overt bleeding-plans are to permanently stop anticoagulation.     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  Per oncology-this is not a curative situation-oncology is in the process of trying to get a second opinion with GI oncology at [facility].  As noted above-GI plans to to follow her up in the office tomorrow-6/1.  History ofLeft breast cancer 2008,pT1c,pN0, status post a left lumpectomy with adjuvant chemotherapy and radiation, ER positive, PR positive, HER-2 positive  Minimally elevated troponin: Secondary to demand ischemia/PE-not ACS-not a candidate for any further work-up given advanced malignancy.  Thrombocytopenia: Improving slowly-probably reflective of consumptive thrombocytopenia in the setting of significant VTE.  History of lupus/Raynaud's phenomenon: Plaquenil/Imuran has been on hold for the past week-plan is to continue to hold-and to consider resuming if she clinically stabilizes-with no further bleeding.  This was discussed with her primary oncologist on the day of discharge.  Palliative care: Full code-palliative care following.  Patient/family aware of difficult situation-GI bleeding and VTE at the same time.  Thankfully bleeding appears to be low-grade-plan is to see if she can tolerate anticoagulation long-term.  Oncology following with plans to refer to GI [facility] oncology for second opinion.  Suspect very poor prognosis given progression of underlying malignancy in spite of chemotherapy.  Obesity: Estimated body mass index is 30.35 kg/m as calculated from the following:   Height as of this encounter: 5' 4" (1.626 m).   Weight as of this encounter: 80.2 kg.    Discharge Diagnoses:  Active Problems:   Symptomatic anemia   Palliative care by specialist    General weakness   Acute massive pulmonary embolism ([place])   Demand ischemia [facility])   Discharge Instructions:  Activity:  As tolerated   Discharge Instructions    Call MD for:   Complete by: As directed    If you vomit blood or have black stools   Call MD for:  difficulty breathing, headache or visual disturbances   Complete by: As directed    Call MD for:  extreme fatigue   Complete by: As directed    Diet general   Complete by: As directed    Discharge instructions   Complete by: As directed    Follow with Primary MD  Bennie Pierini A, DO in 1-2 weeks  Please get a complete blood count and chemistry panel checked by your Primary MD at your next visit, and again as instructed by your Primary MD.  Get Medicines reviewed and adjusted: Please take all your medications with you for your next visit with your Primary MD  Laboratory/radiological data: Please request your Primary MD to go over all hospital tests and procedure/radiological results at the follow up, please ask your Primary MD to get all Hospital records sent to his/her office.  In some cases, they will be blood work, cultures and biopsy results pending at the time of your discharge. Please request that your primary care M.D. follows up on these results.  Also Note the following: If you experience worsening of your admission symptoms, develop shortness of breath, life threatening emergency, suicidal or homicidal thoughts you must seek medical attention immediately by calling 911 or calling your MD immediately  if symptoms less severe.  You must read complete instructions/literature along with all the possible adverse reactions/side  effects for all the Medicines you take and that have been prescribed to you. Take any new Medicines after you have completely understood and accpet all the possible adverse reactions/side effects.   Do not drive when taking Pain medications or sleeping medications (Benzodaizepines)  Do not  take more than prescribed Pain, Sleep and Anxiety Medications. It is not advisable to combine anxiety,sleep and pain medications without talking with your primary care practitioner  Special Instructions: If you have smoked or chewed Tobacco  in the last 2 yrs please stop smoking, stop any regular Alcohol  and or any Recreational drug use.  Wear Seat belts while driving.  Please note: You were cared for by a hospitalist during your hospital stay. Once you are discharged, your primary care physician will handle any further medical issues. Please note that NO REFILLS for any discharge medications will be authorized once you are discharged, as it is imperative that you return to your primary care physician (or establish a relationship with a primary care physician if you do not have one) for your post hospital discharge needs so that they can reassess your need for medications and monitor your lab values.   Increase activity slowly   Complete by: As directed    No dressing needed   Complete by: As directed      Allergies as of 09/14/2020   No Known Allergies     Medication List    STOP taking these medications   azaTHIOprine 50 MG tablet Commonly known as: IMURAN   Cholecalciferol 50 MCG (2000 UT) Caps   docusate sodium 100 MG capsule Commonly known as: COLACE   gabapentin 300 MG capsule Commonly known as: NEURONTIN   HYDROcodone-acetaminophen 10-325 MG tablet Commonly known as: NORCO   hydroxychloroquine 200 MG tablet Commonly known as: PLAQUENIL   metoCLOPramide 10 MG tablet Commonly known as: REGLAN   MIRALAX PO   omeprazole 40 MG capsule Commonly known as: PRILOSEC   ondansetron 8 MG tablet Commonly known as: ZOFRAN   oxyCODONE-acetaminophen 5-325 MG tablet Commonly known as: Percocet   potassium chloride 10 MEQ tablet Commonly known as: KLOR-CON   traMADol 50 MG tablet Commonly known as: ULTRAM     TAKE these medications   dronabinol 2.5 MG  capsule Commonly known as: MARINOL Take 1 capsule (2.5 mg total) by mouth 2 (two) times daily.   enoxaparin 80 MG/0.8ML injection Commonly known as: LOVENOX Inject 0.8 mLs (80 mg total) into the skin daily. Please provide 30-day supply.   oxyCODONE 5 MG immediate release tablet Commonly known as: Oxy IR/ROXICODONE Take 1 tablet (5 mg total) by mouth every 6 (six) hours as needed for moderate pain.   pantoprazole 40 MG tablet Commonly known as: Protonix Take 1 tablet (40 mg total) by mouth 2 (two) times daily.   sucralfate 1 GM/10ML suspension Commonly known as: CARAFATE Take 10 mLs (1 g total) by mouth 4 (four) times daily -  with meals and at bedtime. What changed: when to take this            Durable Medical Equipment  (From admission, onward)         Start     Ordered   09/14/20 1024  For home use only DME Walker rolling  Once       Question Answer Comment  Walker: With [place] Wheels   Patient needs a walker to treat with the following condition Weakness      09/14/20 1023  Discharge Care Instructions  (From admission, onward)         Start     Ordered   09/14/20 0000  No dressing needed        09/14/20 1033          Follow-up Information    Bennie Pierini A, DO. Schedule an appointment as soon as possible for a visit in 1 week(s).   Specialty: Internal Medicine Contact information: [place] [id number] [phone]        Ladell Pier, MD Follow up on 09/15/2020.   Specialty: Oncology Why: keep existing appt on 6/1 Contact information: [place] [address] [phone]              No Known Allergies    Other Procedures/Studies: CT ANGIO CHEST PE W OR WO CONTRAST  Result Date: 09/09/2020 CLINICAL DATA:  55 year old female with history of abnormal V/Q scan. History of breast cancer. EXAM: CT ANGIOGRAPHY CHEST WITH CONTRAST TECHNIQUE: Multidetector CT imaging of the chest was  performed using the standard protocol during bolus administration of intravenous contrast. Multiplanar CT image reconstructions and MIPs were obtained to evaluate the vascular anatomy. CONTRAST:  162m OMNIPAQUE IOHEXOL 350 MG/ML SOLN COMPARISON:  Chest CTA 08/16/2020. FINDINGS: Cardiovascular: Numerous filling defects throughout the pulmonary arterial tree compatible with extensive pulmonary embolus. Specifically, there are 2 large saddle emboli with extension into lobar, segmental and subsegmental sized branches in the lungs bilaterally. The majority of these emboli appear nonocclusive. Pulmonic trunk is currently nondilated measuring 2.3 cm in diameter. Right ventricle appears dilated with a diameter of approximately 52 mm. Left ventricular diameter is approximately 37 mm. RV to LV ratio of 1.41. There is no significant pericardial fluid, thickening or pericardial calcification. No atherosclerotic calcifications in the thoracic aorta or the coronary arteries. Left-sided subclavian single-lumen porta cath with tip terminating in the right atrium. Mediastinum/Nodes: No pathologically enlarged mediastinal or hilar lymph nodes. Esophagus is unremarkable in appearance. No axillary lymphadenopathy. Lungs/Pleura: There are some patchy areas of ground-glass attenuation and mild septal thickening in the lower lobes of the lungs bilaterally, nonspecific, potentially predominantly atelectatic, although areas of mild alveolar hemorrhage are difficult to exclude. No confluent consolidative airspace disease. Trace left pleural effusion. No definite suspicious appearing pulmonary nodules or masses are noted. Upper Abdomen: Small amount of perinephric stranding adjacent to the left kidney, nonspecific. Musculoskeletal: There are no aggressive appearing lytic or blastic lesions noted in the visualized portions of the skeleton. Review of the MIP images confirms the above findings. IMPRESSION: 1. Positive for acute PE with CT  evidence of right heart strain (RV/LV Ratio = 1.41) consistent with at least submassive (intermediate risk) PE. The presence of right heart strain has been associated with an increased risk of morbidity and mortality. Please refer to the "PE Focused" order set in EPIC. 2. Small amount of ground-glass attenuation and septal thickening in the lower lobes of the lungs bilaterally. The possibility of areas of mild alveolar hemorrhage is not excluded, although most of this is likely atelectatic. 3. Trace left pleural effusion. Critical Value/emergent results were discussed in person at the time of interpretation on 09/09/2020 at 3:44 pm to provider Dr. JRonny Bacon who verbally acknowledged these results. Electronically Signed   By: DVinnie LangtonM.D.   On: 09/09/2020 16:00   CT ANGIO CHEST PE W OR WO CONTRAST  Result Date: 08/16/2020 CLINICAL DATA:  Shortness of breath and chest pain. History of breast, gastric, and  colon cancer. EXAM: CT ANGIOGRAPHY CHEST WITH CONTRAST TECHNIQUE: Multidetector CT imaging of the chest was performed using the standard protocol during bolus administration of intravenous contrast. Multiplanar CT image reconstructions and MIPs were obtained to evaluate the vascular anatomy. CONTRAST:  126m OMNIPAQUE IOHEXOL 350 MG/ML SOLN COMPARISON:  Chest CT May 11, 2020 FINDINGS: Cardiovascular: There is no demonstrable pulmonary embolus. A vessel in the left lower lobe makes an acute turn causing a subtle defect on the axial images which is not confirmed on other images and is not felt to represent pulmonary embolus. There is no thoracic aortic aneurysm or dissection. Visualized great vessels appear unremarkable. Port-A-Cath tip is in the superior vena cava. No evident pericardial effusion or pericardial thickening. Mediastinum/Nodes: Status post left thyroidectomy. Remaining thyroid appears normal. No appreciable adenopathy. There remains circumferential thickening in the distal esophagus which  is unchanged from prior study. This area may represent previous treated neoplasm but also may represent esophagitis. The appearance in this area is stable. Lungs/Pleura: There is slight bibasilar atelectasis. No evident edema or airspace opacity. No appreciable pleural effusions. Trachea and major bronchial structures appear patent. No evident pneumothorax. Upper Abdomen: Gallbladder is absent. Occasional presumed calcified granulomas in the spleen. Incomplete visualization of cyst arising from posterior right kidney measuring 0.8 x 0.8 cm. Musculoskeletal: No blastic or lytic bone lesions. Port present on the left anteriorly. No chest wall lesions appreciable. Review of the MIP images confirms the above findings. IMPRESSION: 1. No demonstrable pulmonary embolus. No thoracic aortic aneurysm or dissection. 2. Circumferential thickening distal esophagus again noted which may represent residua of previous treated neoplasm but also could represent a degree of esophagitis. 3.  No edema or airspace opacity.  Mild bibasilar atelectasis. 4.  No evident adenopathy. 5. Status post left-sided thyroidectomy. Status post cholecystectomy. 6.  Port-A-Cath tip in superior vena cava near cavoatrial junction. Electronically Signed   By: WLowella GripIII M.D.   On: 08/16/2020 13:31   NM Pulmonary Perfusion  Result Date: 09/09/2020 CLINICAL DATA:  Deep venous thrombosis, shortness of breath and chest pain for 3 days, elevated D-dimer, high clinical suspicion of pulmonary embolism EXAM: NUCLEAR MEDICINE PERFUSION LUNG SCAN TECHNIQUE: Perfusion images were obtained in multiple projections after intravenous injection of radiopharmaceutical. Ventilation scans intentionally deferred if perfusion scan and chest x-ray adequate for interpretation during COVID 19 epidemic. RADIOPHARMACEUTICALS:  4.25 mCi Tc-972mAA IV COMPARISON:  Chest radiograph 09/08/2020 FINDINGS: Subsegmental perfusion defects identified within RIGHT middle lobe  and lingula. Additional subsegmental perfusion defect LEFT lower lobe. Large multi segmental perfusion defect in RIGHT upper lobe. Findings are consistent with pulmonary embolism. Chest radiograph shows mild atelectasis or infiltrate in the RIGHT lower lobe, remaining lungs clear. IMPRESSION: Multiple BILATERAL pulmonary perfusion defects consistent with pulmonary embolism. Critical Value/emergent results were called by telephone at the time of interpretation on 09/09/2020 at 1200 hrs to provider Dr. C.Aileen Fasswho verbally acknowledged these results. Electronically Signed   By: MaLavonia Dana.D.   On: 09/09/2020 12:00   IR Angiogram Pulmonary Bilateral Selective  Result Date: 09/10/2020 INDICATION: [age]ear old woman with massive pulmonary artery embolism presented with syncope and saddle PE. Interventional radiology consulted for pulmonary thrombectomy/thrombolysis. Patient unable to receive systemic tPA due to bleeding gastric mass. EXAM: 1. Ultrasound-guided access of right internal jugular vein 2. Central venous catheter placement 3. Ultrasound-guided access of right common femoral vein 4. Bilateral pulmonary angiogram and mechanical thrombectomy COMPARISON:  CT angiography chest 06/12/2020 MEDICATIONS: None ANESTHESIA/SEDATION: Sedation performed and monitored by  the anesthesia team FLUOROSCOPY TIME:  Fluoroscopy Time: 20 minutes 48 seconds (83 mGy). COMPLICATIONS: None immediate. TECHNIQUE: Informed written consent was obtained from the patient after a thorough discussion of the procedural risks, benefits and alternatives. All questions were addressed. Maximal Sterile Barrier Technique was utilized including caps, mask, sterile gowns, sterile gloves, sterile drape, hand hygiene and skin antiseptic. A timeout was performed prior to the initiation of the procedure. Patient positioned supine on the procedure table. Right neck and groin skin prepped and draped in usual fashion. The right internal jugular vein was  evaluated with ultrasound and shown to be patent. A permanent ultrasound image was obtained and placed in the patient's medical record. Using sterile gel and a sterile probe cover, the right internal jugular vein was entered with a 21 ga needle during real time ultrasound guidance. 0.018 inch guidewire placed and 21 ga needle exchanged for transitional dilator set. Utilizing fluoroscopy, 0.035 inch guidewire advanced through the needle without difficulty. Serial dilation performed, and catheter inserted over the guidewire. The tip was positioned in the right atrium. All lumens of the catheter aspirated and flushed well. The catheter was secured to the skin with suture. The insertion site was covered with a Biopatch and sterile dressing. The right common femoral vein was evaluated with ultrasound and shown to be patent. A permanent ultrasound image was obtained and placed in the patient's medical record. Using sterile gel and a sterile probe cover the right common femoral vein was entered with a 21 gauge needle during real time ultrasound guidance. 21 gauge needle exchanged for transitional dilator set over 0.018 in guidewire. Transitional dilator set exchanged for 6 French sheath over 0.035 in guidewire. 6 Fr MIV double angle pigtail catheter advanced to the main pulmonary artery utilizing fluoroscopic guidance. Pre thrombectomy pulmonary artery pressure: 39/11 mmHg (mean 26 mmHg) Double angled pigtail catheter removed over exchange length Amplatz guidewire. Vert catheter utilized to advance Amplatz guidewire into right lower lobe pulmonary artery branch. Serial dilation was performed and 6 [place] sheath exchanged for [employer] 24 French sheath. 58 [place] FlowTriver device was advanced over the wire into the lateral basilar segmental and Truncus Anterior branches of the right pulmonary arterial system. Mechanical aspiration thrombectomy was performed extirpating thrombus. Post thrombectomy pulmonary  angiogram demonstrated significant reduction in overall clot burden in the right pulmonary arterial branches. The left lateral basilar segmental branch of the lower lobe artery was selected with the [facility] catheter. The 20 [place] FlowTriver device was advanced through the 24 French device over the wire into the lateral basilar segmental branch of the left pulmonary arterial system. Mechanical aspiration thrombectomy was performed extirpating thrombus. Post aspiration pulmonary angiogram demonstrated significant reduction in overall clot burden. Post thrombectomy Pulmonary artery pressure: 27/7 mmHg (mean 15 mmHg) FlowTriever device was removed. The [place] [employer] sheath was removed and hemostasis achieved with purse-string suture and manual compression. IMPRESSION: 1. Bilateral pulmonary angiogram and mechanical thrombectomy. Mean pulmonary artery pressure decreased from 26 mm Hg to 15 mm Hg following thrombectomy. 2. Right IJ non tunneled central line placement. Electronically Signed   By: Miachel Roux M.D.   On: 09/10/2020 12:35   IR IVC FILTER PLMT / S&I Burke Keels GUID/MOD SED  Result Date: 09/12/2020 INDICATION: DVT with contraindication to anticoagulation. Please perform IVC filter placement for caval interruption purposes. Given patient's multiple medical comorbidities, the IVC filter will be considered a permanent device in the patient will not be actively followed by the interventional radiology service for  retrieval. EXAM: ULTRASOUND GUIDANCE FOR VASCULAR ACCESS IVC CATHETERIZATION AND VENOGRAM IVC FILTER INSERTION MEDICATIONS: None. ANESTHESIA/SEDATION: Fentanyl 100 mcg IV; Versed 2 mg IV Sedation Time: 11 minutes; The patient was continuously monitored during the procedure by the interventional radiology nurse under my direct supervision. CONTRAST:  None, CO2 was utilized for this examination FLUOROSCOPY TIME:  42 seconds (16 mGy) COMPLICATIONS: None immediate. PROCEDURE: Informed written consent was  obtained from the patient following explanation of the procedure, risks, benefits and alternatives. A time out was performed prior to the initiation of the procedure. Given the presence of the existing right jugular approach central venous catheter and decision was made to proceed with IVC filter placement via the right common femoral vein. Maximal barrier sterile technique utilized including caps, mask, sterile gowns, sterile gloves, large sterile drape, hand hygiene, and Betadine prep. Under sterile condition and local anesthesia, right common femoral venous access was performed with ultrasound. An ultrasound image was saved and sent to PACS. Over a guidewire, the IVC filter delivery sheath and inner dilator were advanced into the IVC just above the IVC bifurcation. CO2 injection was performed for an IVC venogram. Through the delivery sheath, a retrievable [place] IVC filter was deployed below the level of the renal veins and above the IVC bifurcation. The delivery sheath was removed and hemostasis was obtained with manual compression. A dressing was placed. The patient tolerated the procedure well without immediate post procedural complication. FINDINGS: The IVC is patent. No evidence of thrombus, stenosis, or occlusion. No variant venous anatomy. Successful placement of the IVC filter below the level of the renal veins. IMPRESSION: Successful ultrasound and fluoroscopically guided placement of an infrarenal retrievable IVC filter. PLAN: Due to patient related comorbidities and/or clinical necessity, this IVC filter should be considered a permanent device. This patient will not be actively followed for future filter retrieval. Electronically Signed   By: Sandi Mariscal M.D.   On: 09/12/2020 07:02   IR Fluoro Guide CV Line Right  Result Date: 09/10/2020 INDICATION: 55 year old woman with massive pulmonary artery embolism presented with syncope and saddle PE. Interventional radiology consulted for pulmonary  thrombectomy/thrombolysis. Patient unable to receive systemic tPA due to bleeding gastric mass. EXAM: 1. Ultrasound-guided access of right internal jugular vein 2. Central venous catheter placement 3. Ultrasound-guided access of right common femoral vein 4. Bilateral pulmonary angiogram and mechanical thrombectomy COMPARISON:  CT angiography chest 06/12/2020 MEDICATIONS: None ANESTHESIA/SEDATION: Sedation performed and monitored by the anesthesia team FLUOROSCOPY TIME:  Fluoroscopy Time: 20 minutes 48 seconds (83 mGy). COMPLICATIONS: None immediate. TECHNIQUE: Informed written consent was obtained from the patient after a thorough discussion of the procedural risks, benefits and alternatives. All questions were addressed. Maximal Sterile Barrier Technique was utilized including caps, mask, sterile gowns, sterile gloves, sterile drape, hand hygiene and skin antiseptic. A timeout was performed prior to the initiation of the procedure. Patient positioned supine on the procedure table. Right neck and groin skin prepped and draped in usual fashion. The right internal jugular vein was evaluated with ultrasound and shown to be patent. A permanent ultrasound image was obtained and placed in the patient's medical record. Using sterile gel and a sterile probe cover, the right internal jugular vein was entered with a 21 ga needle during real time ultrasound guidance. 0.018 inch guidewire placed and 21 ga needle exchanged for transitional dilator set. Utilizing fluoroscopy, 0.035 inch guidewire advanced through the needle without difficulty. Serial dilation performed, and catheter inserted over the guidewire. The tip was positioned in the  right atrium. All lumens of the catheter aspirated and flushed well. The catheter was secured to the skin with suture. The insertion site was covered with a Biopatch and sterile dressing. The right common femoral vein was evaluated with ultrasound and shown to be patent. A permanent ultrasound  image was obtained and placed in the patient's medical record. Using sterile gel and a sterile probe cover the right common femoral vein was entered with a 21 gauge needle during real time ultrasound guidance. 21 gauge needle exchanged for transitional dilator set over 0.018 in guidewire. Transitional dilator set exchanged for 6 French sheath over 0.035 in guidewire. 6 Fr MIV double angle pigtail catheter advanced to the main pulmonary artery utilizing fluoroscopic guidance. Pre thrombectomy pulmonary artery pressure: 39/11 mmHg (mean 26 mmHg) Double angled pigtail catheter removed over exchange length Amplatz guidewire. Vert catheter utilized to advance Amplatz guidewire into right lower lobe pulmonary artery branch. Serial dilation was performed and 6 [place] sheath exchanged for [employer] 24 French sheath. 19 [place] FlowTriver device was advanced over the wire into the lateral basilar segmental and Truncus Anterior branches of the right pulmonary arterial system. Mechanical aspiration thrombectomy was performed extirpating thrombus. Post thrombectomy pulmonary angiogram demonstrated significant reduction in overall clot burden in the right pulmonary arterial branches. The left lateral basilar segmental branch of the lower lobe artery was selected with the [facility] catheter. The 20 [place] FlowTriver device was advanced through the 24 French device over the wire into the lateral basilar segmental branch of the left pulmonary arterial system. Mechanical aspiration thrombectomy was performed extirpating thrombus. Post aspiration pulmonary angiogram demonstrated significant reduction in overall clot burden. Post thrombectomy Pulmonary artery pressure: 27/7 mmHg (mean 15 mmHg) FlowTriever device was removed. The Gore Dry Seal sheath was removed and hemostasis achieved with purse-string suture and manual compression. IMPRESSION: 1. Bilateral pulmonary angiogram and mechanical thrombectomy. Mean pulmonary  artery pressure decreased from 26 mm Hg to 15 mm Hg following thrombectomy. 2. Right IJ non tunneled central line placement. Electronically Signed   By: Miachel Roux M.D.   On: 09/10/2020 12:35   IR THROMBECT PRIM MECH INIT (INCLU) MOD SED  Result Date: 09/10/2020 INDICATION: 55 year old woman with massive pulmonary artery embolism presented with syncope and saddle PE. Interventional radiology consulted for pulmonary thrombectomy/thrombolysis. Patient unable to receive systemic tPA due to bleeding gastric mass. EXAM: 1. Ultrasound-guided access of right internal jugular vein 2. Central venous catheter placement 3. Ultrasound-guided access of right common femoral vein 4. Bilateral pulmonary angiogram and mechanical thrombectomy COMPARISON:  CT angiography chest 06/12/2020 MEDICATIONS: None ANESTHESIA/SEDATION: Sedation performed and monitored by the anesthesia team FLUOROSCOPY TIME:  Fluoroscopy Time: 20 minutes 48 seconds (83 mGy). COMPLICATIONS: None immediate. TECHNIQUE: Informed written consent was obtained from the patient after a thorough discussion of the procedural risks, benefits and alternatives. All questions were addressed. Maximal Sterile Barrier Technique was utilized including caps, mask, sterile gowns, sterile gloves, sterile drape, hand hygiene and skin antiseptic. A timeout was performed prior to the initiation of the procedure. Patient positioned supine on the procedure table. Right neck and groin skin prepped and draped in usual fashion. The right internal jugular vein was evaluated with ultrasound and shown to be patent. A permanent ultrasound image was obtained and placed in the patient's medical record. Using sterile gel and a sterile probe cover, the right internal jugular vein was entered with a 21 ga needle during real time ultrasound guidance. 0.018 inch guidewire placed and 21 ga needle exchanged  for transitional dilator set. Utilizing fluoroscopy, 0.035 inch guidewire advanced through  the needle without difficulty. Serial dilation performed, and catheter inserted over the guidewire. The tip was positioned in the right atrium. All lumens of the catheter aspirated and flushed well. The catheter was secured to the skin with suture. The insertion site was covered with a Biopatch and sterile dressing. The right common femoral vein was evaluated with ultrasound and shown to be patent. A permanent ultrasound image was obtained and placed in the patient's medical record. Using sterile gel and a sterile probe cover the right common femoral vein was entered with a 21 gauge needle during real time ultrasound guidance. 21 gauge needle exchanged for transitional dilator set over 0.018 in guidewire. Transitional dilator set exchanged for 6 French sheath over 0.035 in guidewire. 6 Fr MIV double angle pigtail catheter advanced to the main pulmonary artery utilizing fluoroscopic guidance. Pre thrombectomy pulmonary artery pressure: 39/11 mmHg (mean 26 mmHg) Double angled pigtail catheter removed over exchange length Amplatz guidewire. Vert catheter utilized to advance Amplatz guidewire into right lower lobe pulmonary artery branch. Serial dilation was performed and 6 [place] sheath exchanged for [employer] 24 French sheath. 71 [place] FlowTriver device was advanced over the wire into the lateral basilar segmental and Truncus Anterior branches of the right pulmonary arterial system. Mechanical aspiration thrombectomy was performed extirpating thrombus. Post thrombectomy pulmonary angiogram demonstrated significant reduction in overall clot burden in the right pulmonary arterial branches. The left lateral basilar segmental branch of the lower lobe artery was selected with the [facility] catheter. The 20 [place] FlowTriver device was advanced through the 24 French device over the wire into the lateral basilar segmental branch of the left pulmonary arterial system. Mechanical aspiration thrombectomy was  performed extirpating thrombus. Post aspiration pulmonary angiogram demonstrated significant reduction in overall clot burden. Post thrombectomy Pulmonary artery pressure: 27/7 mmHg (mean 15 mmHg) FlowTriever device was removed. The [place] Dry Seal sheath was removed and hemostasis achieved with purse-string suture and manual compression. IMPRESSION: 1. Bilateral pulmonary angiogram and mechanical thrombectomy. Mean pulmonary artery pressure decreased from 26 mm Hg to 15 mm Hg following thrombectomy. 2. Right IJ non tunneled central line placement. Electronically Signed   By: Miachel Roux M.D.   On: 09/10/2020 12:35   IR US Guide Vasc Access Right  Result Date: 09/10/2020 INDICATION: 55 year old woman with massive pulmonary artery embolism presented with syncope and saddle PE. Interventional radiology consulted for pulmonary thrombectomy/thrombolysis. Patient unable to receive systemic tPA due to bleeding gastric mass. EXAM: 1. Ultrasound-guided access of right internal jugular vein 2. Central venous catheter placement 3. Ultrasound-guided access of right common femoral vein 4. Bilateral pulmonary angiogram and mechanical thrombectomy COMPARISON:  CT angiography chest 06/12/2020 MEDICATIONS: None ANESTHESIA/SEDATION: Sedation performed and monitored by the anesthesia team FLUOROSCOPY TIME:  Fluoroscopy Time: 20 minutes 48 seconds (83 mGy). COMPLICATIONS: None immediate. TECHNIQUE: Informed written consent was obtained from the patient after a thorough discussion of the procedural risks, benefits and alternatives. All questions were addressed. Maximal Sterile Barrier Technique was utilized including caps, mask, sterile gowns, sterile gloves, sterile drape, hand hygiene and skin antiseptic. A timeout was performed prior to the initiation of the procedure. Patient positioned supine on the procedure table. Right neck and groin skin prepped and draped in usual fashion. The right internal jugular vein was evaluated with  ultrasound and shown to be patent. A permanent ultrasound image was obtained and placed in the patient's medical record. Using sterile gel and a  sterile probe cover, the right internal jugular vein was entered with a 21 ga needle during real time ultrasound guidance. 0.018 inch guidewire placed and 21 ga needle exchanged for transitional dilator set. Utilizing fluoroscopy, 0.035 inch guidewire advanced through the needle without difficulty. Serial dilation performed, and catheter inserted over the guidewire. The tip was positioned in the right atrium. All lumens of the catheter aspirated and flushed well. The catheter was secured to the skin with suture. The insertion site was covered with a Biopatch and sterile dressing. The right common femoral vein was evaluated with ultrasound and shown to be patent. A permanent ultrasound image was obtained and placed in the patient's medical record. Using sterile gel and a sterile probe cover the right common femoral vein was entered with a 21 gauge needle during real time ultrasound guidance. 21 gauge needle exchanged for transitional dilator set over 0.018 in guidewire. Transitional dilator set exchanged for 6 French sheath over 0.035 in guidewire. 6 Fr MIV double angle pigtail catheter advanced to the main pulmonary artery utilizing fluoroscopic guidance. Pre thrombectomy pulmonary artery pressure: 39/11 mmHg (mean 26 mmHg) Double angled pigtail catheter removed over exchange length Amplatz guidewire. Vert catheter utilized to advance Amplatz guidewire into right lower lobe pulmonary artery branch. Serial dilation was performed and 6 [place] sheath exchanged for [employer] 24 French sheath. 69 [place] FlowTriver device was advanced over the wire into the lateral basilar segmental and Truncus Anterior branches of the right pulmonary arterial system. Mechanical aspiration thrombectomy was performed extirpating thrombus. Post thrombectomy pulmonary angiogram  demonstrated significant reduction in overall clot burden in the right pulmonary arterial branches. The left lateral basilar segmental branch of the lower lobe artery was selected with the [facility] catheter. The 20 [place] FlowTriver device was advanced through the 24 French device over the wire into the lateral basilar segmental branch of the left pulmonary arterial system. Mechanical aspiration thrombectomy was performed extirpating thrombus. Post aspiration pulmonary angiogram demonstrated significant reduction in overall clot burden. Post thrombectomy Pulmonary artery pressure: 27/7 mmHg (mean 15 mmHg) FlowTriever device was removed. The [place] Dry Seal sheath was removed and hemostasis achieved with purse-string suture and manual compression. IMPRESSION: 1. Bilateral pulmonary angiogram and mechanical thrombectomy. Mean pulmonary artery pressure decreased from 26 mm Hg to 15 mm Hg following thrombectomy. 2. Right IJ non tunneled central line placement. Electronically Signed   By: Miachel Roux M.D.   On: 09/10/2020 12:35   IR US Guide Vasc Access Right  Result Date: 09/10/2020 INDICATION: 55 year old woman with massive pulmonary artery embolism presented with syncope and saddle PE. Interventional radiology consulted for pulmonary thrombectomy/thrombolysis. Patient unable to receive systemic tPA due to bleeding gastric mass. EXAM: 1. Ultrasound-guided access of right internal jugular vein 2. Central venous catheter placement 3. Ultrasound-guided access of right common femoral vein 4. Bilateral pulmonary angiogram and mechanical thrombectomy COMPARISON:  CT angiography chest 06/12/2020 MEDICATIONS: None ANESTHESIA/SEDATION: Sedation performed and monitored by the anesthesia team FLUOROSCOPY TIME:  Fluoroscopy Time: 20 minutes 48 seconds (83 mGy). COMPLICATIONS: None immediate. TECHNIQUE: Informed written consent was obtained from the patient after a thorough discussion of the procedural risks, benefits and  alternatives. All questions were addressed. Maximal Sterile Barrier Technique was utilized including caps, mask, sterile gowns, sterile gloves, sterile drape, hand hygiene and skin antiseptic. A timeout was performed prior to the initiation of the procedure. Patient positioned supine on the procedure table. Right neck and groin skin prepped and draped in usual fashion. The right internal jugular  vein was evaluated with ultrasound and shown to be patent. A permanent ultrasound image was obtained and placed in the patient's medical record. Using sterile gel and a sterile probe cover, the right internal jugular vein was entered with a 21 ga needle during real time ultrasound guidance. 0.018 inch guidewire placed and 21 ga needle exchanged for transitional dilator set. Utilizing fluoroscopy, 0.035 inch guidewire advanced through the needle without difficulty. Serial dilation performed, and catheter inserted over the guidewire. The tip was positioned in the right atrium. All lumens of the catheter aspirated and flushed well. The catheter was secured to the skin with suture. The insertion site was covered with a Biopatch and sterile dressing. The right common femoral vein was evaluated with ultrasound and shown to be patent. A permanent ultrasound image was obtained and placed in the patient's medical record. Using sterile gel and a sterile probe cover the right common femoral vein was entered with a 21 gauge needle during real time ultrasound guidance. 21 gauge needle exchanged for transitional dilator set over 0.018 in guidewire. Transitional dilator set exchanged for 6 French sheath over 0.035 in guidewire. 6 Fr MIV double angle pigtail catheter advanced to the main pulmonary artery utilizing fluoroscopic guidance. Pre thrombectomy pulmonary artery pressure: 39/11 mmHg (mean 26 mmHg) Double angled pigtail catheter removed over exchange length Amplatz guidewire. Vert catheter utilized to advance Amplatz guidewire into  right lower lobe pulmonary artery branch. Serial dilation was performed and 6 [place] sheath exchanged for [employer] 24 French sheath. 51 [place] FlowTriver device was advanced over the wire into the lateral basilar segmental and Truncus Anterior branches of the right pulmonary arterial system. Mechanical aspiration thrombectomy was performed extirpating thrombus. Post thrombectomy pulmonary angiogram demonstrated significant reduction in overall clot burden in the right pulmonary arterial branches. The left lateral basilar segmental branch of the lower lobe artery was selected with the [facility] catheter. The 20 [place] FlowTriver device was advanced through the 24 French device over the wire into the lateral basilar segmental branch of the left pulmonary arterial system. Mechanical aspiration thrombectomy was performed extirpating thrombus. Post aspiration pulmonary angiogram demonstrated significant reduction in overall clot burden. Post thrombectomy Pulmonary artery pressure: 27/7 mmHg (mean 15 mmHg) FlowTriever device was removed. The [place] Seal sheath was removed and hemostasis achieved with purse-string suture and manual compression. IMPRESSION: 1. Bilateral pulmonary angiogram and mechanical thrombectomy. Mean pulmonary artery pressure decreased from 26 mm Hg to 15 mm Hg following thrombectomy. 2. Right IJ non tunneled central line placement. Electronically Signed   By: Miachel Roux M.D.   On: 09/10/2020 12:35   DG Chest Port 1 View  Result Date: 09/08/2020 CLINICAL DATA:  Shortness of breath.  Syncopal episodes. EXAM: PORTABLE CHEST 1 VIEW COMPARISON:  09/07/2019 FINDINGS: Stable appearance of the left subclavian Port-A-Cath with the tip extending into the upper right atrium. There is a chronic kink in the port near the inferior aspect of the left clavicle. Lungs are clear. Heart size is normal. Trachea is midline. No acute bone abnormality. IMPRESSION: 1. No acute cardiopulmonary disease. 2.  Stable appearance of the left subclavian port with a kink in the catheter near the left clavicle. 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 Electronically Signed   By: Markus Daft M.D.   On: 09/08/2020 13:00   ECHOCARDIOGRAM COMPLETE  Result Date: 09/09/2020    ECHOCARDIOGRAM REPORT   Patient Name:   Michelle Cain Date of  Exam: 09/09/2020 Medical Rec #:  [id number]         Height:       64.0 in Accession #:    [id number]        Weight:       164.5 lb Date of Birth:  03-07-1966         BSA:          1.800 m Patient Age:    37 years          BP:           91/62 mmHg Patient Gender: F                 HR:           114 bpm. Exam Location:  Inpatient Procedure: 2D Echo, Cardiac Doppler and Color Doppler Indications:    Elevated troponin  History:        Patient has no prior history of Echocardiogram examinations.                 Signs/Symptoms:Syncope and Elevated Troponins. Gastric cancer,                 breast cancer, anemia, Chemo.  Sonographer:    Dustin Flock Referring Phys: [id number] [place]  1. There is sinus tachycardia with severe RV dilation and moderately reduced RV function. 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  2. Left ventricular ejection fraction, by estimation, is 60 to 65%. The left ventricle has normal function. The left ventricle has no regional wall motion abnormalities. Indeterminate diastolic filling due to E-A fusion. There is the interventricular septum is flattened in systole and diastole, consistent with right ventricular pressure and volume overload.  3. Right ventricular systolic function is moderately reduced. The right ventricular size is severely enlarged. There is mildly elevated pulmonary artery systolic pressure. The estimated right ventricular systolic pressure is 92.1 mmHg.  4. The mitral valve is grossly normal. No evidence of mitral valve regurgitation. No evidence of mitral stenosis.  5. The  aortic valve is tricuspid. Aortic valve regurgitation is not visualized. No aortic stenosis is present.  6. The inferior vena cava is normal in size with greater than 50% respiratory variability, suggesting right atrial pressure of 3 mmHg. FINDINGS  Left Ventricle: Left ventricular ejection fraction, by estimation, is 60 to 65%. The left ventricle has normal function. The left ventricle has no regional wall motion abnormalities. The left ventricular internal cavity size was normal in size. There is  no left ventricular hypertrophy. The interventricular septum is flattened in systole and diastole, consistent with right ventricular pressure and volume overload. Indeterminate diastolic filling due to E-A fusion. Right Ventricle: The right ventricular size is severely enlarged. No increase in right ventricular wall thickness. Right ventricular systolic function is moderately reduced. There is mildly elevated pulmonary artery systolic pressure. The tricuspid regurgitant velocity is 2.93 m/s, and with an assumed right atrial pressure of 3 mmHg, the estimated right ventricular systolic pressure is 19.4 mmHg. Left Atrium: Left atrial size was normal in size. Right Atrium: Right atrial size was normal in size. Pericardium: Trivial pericardial effusion is present. Mitral Valve: The mitral valve is grossly normal. No evidence of mitral valve regurgitation. No evidence of mitral valve stenosis. Tricuspid Valve: The tricuspid valve is grossly normal. Tricuspid valve regurgitation is mild . No evidence of tricuspid stenosis. Aortic Valve: The aortic valve is tricuspid. Aortic valve regurgitation is not visualized. No aortic stenosis is  present. Pulmonic Valve: The pulmonic valve was grossly normal. Pulmonic valve regurgitation is not visualized. No evidence of pulmonic stenosis. Aorta: The aortic root and ascending aorta are structurally normal, with no evidence of dilitation. Venous: The inferior vena cava is normal in size with  greater than 50% respiratory variability, suggesting right atrial pressure of 3 mmHg. IAS/Shunts: The atrial septum is grossly normal. Additional Comments: A venous catheter is visualized in the right atrium.  LEFT VENTRICLE PLAX 2D LVIDd:         3.20 cm     Diastology LVIDs:         2.40 cm     LV e' medial:    8.05 cm/s LV PW:         1.10 cm     LV E/e' medial:  6.2 LV IVS:        1.20 cm     LV e' lateral:   7.62 cm/s LVOT diam:     1.80 cm     LV E/e' lateral: 6.5 LV SV:         23 LV SV Index:   13 LVOT Area:     2.54 cm  LV Volumes (MOD) LV vol d, MOD A4C: 47.5 ml LV vol s, MOD A4C: 20.7 ml LV SV MOD A4C:     47.5 ml RIGHT VENTRICLE RV Basal diam:  4.10 cm RV S prime:     8.27 cm/s TAPSE (M-mode): 1.6 cm LEFT ATRIUM             Index       RIGHT ATRIUM          Index LA diam:        2.30 cm 1.28 cm/m  RA Area:     9.61 cm LA Vol (A2C):   14.9 ml 8.28 ml/m  RA Volume:   21.80 ml 12.11 ml/m LA Vol (A4C):   25.8 ml 14.33 ml/m LA Biplane Vol: 21.4 ml 11.89 ml/m  AORTIC VALVE LVOT Vmax:   81.70 cm/s LVOT Vmean:  51.100 cm/s LVOT VTI:    0.092 m  AORTA Ao Root diam: 2.60 cm MITRAL VALVE               TRICUSPID VALVE MV Area (PHT): 6.22 cm    TR Peak grad:   34.3 mmHg MV Decel Time: 122 msec    TR Vmax:        293.00 cm/s MV E velocity: 49.80 cm/s MV A velocity: 78.20 cm/s  SHUNTS MV E/A ratio:  0.64        Systemic VTI:  0.09 m                            Systemic Diam: 1.80 cm Eleonore Chiquito MD Electronically signed by Eleonore Chiquito MD Signature Date/Time: 09/09/2020/12:04:31 PM    Final    VAS [place] LOWER EXTREMITY VENOUS (DVT)  Result Date: 09/09/2020  Lower Venous DVT Study Patient Name:  Michelle Cain  Date of Exam:   09/09/2020 Medical Rec #: [id number]          Accession #:    [id number] Date of Birth: 07/23/1965          Patient Gender: F Patient Age:   23Y Exam Location:  [facility] Procedure:      VAS [place] LOWER EXTREMITY VENOUS (DVT) Referring Phys: [id number] Silvestre Moment [facility]  --------------------------------------------------------------------------------  Indications: Pulmonary embolism.  Comparison Study: no prior Performing Technologist: Abram Sander RVS  Examination Guidelines: A complete evaluation includes B-mode imaging, spectral Doppler, color Doppler, and power Doppler as needed of all accessible portions of each vessel. Bilateral testing is considered an integral part of a complete examination. Limited examinations for reoccurring indications may be performed as noted. The reflux portion of the exam is performed with the patient in reverse Trendelenburg.  +---------+---------------+---------+-----------+----------+--------------+ RIGHT    CompressibilityPhasicitySpontaneityPropertiesThrombus Aging +---------+---------------+---------+-----------+----------+--------------+ CFV      Full           Yes      Yes                                 +---------+---------------+---------+-----------+----------+--------------+ SFJ      Full                                                        +---------+---------------+---------+-----------+----------+--------------+ FV Prox  Full                                                        +---------+---------------+---------+-----------+----------+--------------+ FV Mid   Full                                                        +---------+---------------+---------+-----------+----------+--------------+ FV DistalFull                                                        +---------+---------------+---------+-----------+----------+--------------+ PFV      Full                                                        +---------+---------------+---------+-----------+----------+--------------+ POP      Full           Yes      Yes                                 +---------+---------------+---------+-----------+----------+--------------+ PTV      Full                                                         +---------+---------------+---------+-----------+----------+--------------+ PERO     Full                                                        +---------+---------------+---------+-----------+----------+--------------+   +---------+---------------+---------+-----------+----------+-----------------+  LEFT     CompressibilityPhasicitySpontaneityPropertiesThrombus Aging    +---------+---------------+---------+-----------+----------+-----------------+ CFV      Full           Yes      Yes                                    +---------+---------------+---------+-----------+----------+-----------------+ SFJ      Full                                                           +---------+---------------+---------+-----------+----------+-----------------+ FV Prox  Full                                                           +---------+---------------+---------+-----------+----------+-----------------+ FV Mid   Full                                                           +---------+---------------+---------+-----------+----------+-----------------+ FV DistalFull                                                           +---------+---------------+---------+-----------+----------+-----------------+ PFV      Full                                                           +---------+---------------+---------+-----------+----------+-----------------+ POP      None           Yes      Yes                  Age Indeterminate +---------+---------------+---------+-----------+----------+-----------------+ PTV      None                                         Age Indeterminate +---------+---------------+---------+-----------+----------+-----------------+ PERO     None                                         Age Indeterminate +---------+---------------+---------+-----------+----------+-----------------+     Summary: RIGHT: - There is no  evidence of deep vein thrombosis in the lower extremity.  - No cystic structure found in the popliteal fossa.  LEFT: - Findings consistent with age indeterminate deep vein thrombosis involving the left popliteal vein, left posterior tibial veins, and left peroneal veins. - No cystic structure found in the popliteal fossa.  *See table(s)  above for measurements and observations. Electronically signed by Servando Snare MD on 09/09/2020 at 4:03:54 PM.    Final    VAS [place] UPPER EXTREMITY VENOUS DUPLEX  Result Date: 09/10/2020 UPPER VENOUS STUDY  Patient Name:  Michelle Cain  Date of Exam:   09/10/2020 Medical Rec #: [id number]          Accession #:    [id number] Date of Birth: [birth year]          Patient Gender: F Patient Age:   83Y Exam Location:  [facility] Procedure:      VAS [place] UPPER EXTREMITY VENOUS DUPLEX Referring Phys: [id number] [place] --------------------------------------------------------------------------------  Indications: Edema Limitations: Poor ultrasound/tissue interface, bandages and line. Comparison Study: No prior study Performing Technologist: Maudry Mayhew MHA, RDMS, RVT, RDCS  Examination Guidelines: A complete evaluation includes B-mode imaging, spectral Doppler, color Doppler, and power Doppler as needed of all accessible portions of each vessel. Bilateral testing is considered an integral part of a complete examination. Limited examinations for reoccurring indications may be performed as noted.  Right Findings: +----------+------------+---------+-----------+--------------+-----------------+ RIGHT     CompressiblePhasicitySpontaneous  Properties       Summary      +----------+------------+---------+-----------+--------------+-----------------+ IJV                      Yes       Yes                                    +----------+------------+---------+-----------+--------------+-----------------+ Subclavian    None                 No                          Acute       +----------+------------+---------+-----------+--------------+-----------------+ Axillary      None                 Yes      partially   Age Indeterminate                                           re-cannulated                   +----------+------------+---------+-----------+--------------+-----------------+ Brachial      Full                                                        +----------+------------+---------+-----------+--------------+-----------------+ Radial        Full                                                        +----------+------------+---------+-----------+--------------+-----------------+ Ulnar         Full                                                        +----------+------------+---------+-----------+--------------+-----------------+  Cephalic      Full                                                        +----------+------------+---------+-----------+--------------+-----------------+ Basilic       Full                                                        +----------+------------+---------+-----------+--------------+-----------------+  Left Findings: +----------+------------+---------+-----------+----------+-------+ LEFT      CompressiblePhasicitySpontaneousPropertiesSummary +----------+------------+---------+-----------+----------+-------+ Subclavian               Yes       Yes                      +----------+------------+---------+-----------+----------+-------+  Summary:  Right: Findings consistent with acute deep vein thrombosis involving the right subclavian vein. Findings consistent with age indeterminate deep vein thrombosis involving the right axillary vein.  Left: No evidence of thrombosis in the subclavian.  *See table(s) above for measurements and observations.  Diagnosing physician: Monica Martinez MD Electronically signed by Monica Martinez MD on 09/10/2020 at 4:32:26 PM.    Final       TODAY-DAY OF DISCHARGE:  Subjective:   Michelle Cain today has no headache,no chest abdominal pain,no new weakness tingling or numbness, feels much better wants to go home today.   Objective:   Blood pressure (!) 87/58, pulse 98, temperature 98.4 F (36.9 C), temperature source Oral, resp. rate 14, height 5' 4" (1.626 m), weight 80.2 kg, SpO2 99 %.  Intake/Output Summary (Last 24 hours) at 09/14/2020 1036 Last data filed at 09/13/2020 1500 Gross per 24 hour  Intake 360 ml  Output --  Net 360 ml   Filed Weights   09/08/20 1526 09/12/20 0442  Weight: 74.6 kg 80.2 kg    Exam: Awake Alert, Oriented *3, No new F.N deficits, Normal affect [place].AT,PERRAL Supple Neck,No JVD, No cervical lymphadenopathy appriciated.  Symmetrical Chest wall movement, Good air movement bilaterally, CTAB RRR,No Gallops,Rubs or new Murmurs, No Parasternal Heave +ve B.Sounds, Abd Soft, Non tender, No organomegaly appriciated, No rebound -guarding or rigidity. No Cyanosis, Clubbing or edema, No new Rash or bruise   PERTINENT RADIOLOGIC STUDIES: No results found.   PERTINENT LAB RESULTS: CBC: Recent Labs    09/13/20 0127 09/14/20 0131  WBC 7.4 6.7  HGB 10.2* 8.9*  HCT 31.2* 27.5*  PLT 128* 135*   CMET CMP     Component Value Date/Time   NA 136 09/12/2020 0731   K 4.0 09/12/2020 0731   CL 108 09/12/2020 0731   CO2 25 09/12/2020 0731   GLUCOSE 97 09/12/2020 0731   BUN 10 09/12/2020 0731   CREATININE 0.57 09/12/2020 0731   CREATININE 0.43 (L) 09/01/2020 0950   CALCIUM 7.4 (L) 09/12/2020 0731   PROT 4.7 (L) 09/12/2020 0731   ALBUMIN 1.9 (L) 09/12/2020 0731   AST 16 09/12/2020 0731   AST 16 09/01/2020 0950   ALT 8 09/12/2020 0731   ALT 7 09/01/2020 0950   ALKPHOS 63 09/12/2020 0731   BILITOT 0.6 09/12/2020 0731   BILITOT 0.4 09/01/2020 0950   GFRNONAA >60 09/12/2020 0731   GFRNONAA >  60 09/01/2020 0950   GFRAA >60 01/06/2020 0954    GFR Estimated Creatinine Clearance:  83.3 mL/min (by C-G formula based on SCr of 0.57 mg/dL). No results for input(s): LIPASE, AMYLASE in the last 72 hours. No results for input(s): CKTOTAL, CKMB, CKMBINDEX, TROPONINI in the last 72 hours. Invalid input(s): POCBNP No results for input(s): DDIMER in the last 72 hours. No results for input(s): HGBA1C in the last 72 hours. No results for input(s): CHOL, HDL, LDLCALC, TRIG, CHOLHDL, LDLDIRECT in the last 72 hours. No results for input(s): TSH, T4TOTAL, T3FREE, THYROIDAB in the last 72 hours.  Invalid input(s): FREET3 No results for input(s): VITAMINB12, FOLATE, FERRITIN, TIBC, IRON, RETICCTPCT in the last 72 hours. Coags: No results for input(s): INR in the last 72 hours.  Invalid input(s): PT Microbiology: Recent Results (from the past 240 hour(s))  SARS CORONAVIRUS 2 (TAT 6-24 HRS) Nasopharyngeal Nasopharyngeal Swab     Status: None   Collection Time: 09/08/20 12:46 PM   Specimen: Nasopharyngeal Swab  Result Value Ref Range Status   SARS Coronavirus 2 NEGATIVE NEGATIVE Final    Comment: (NOTE) SARS-CoV-2 target nucleic acids are NOT DETECTED.  The SARS-CoV-2 RNA is generally detectable in upper and lower respiratory specimens during the acute phase of infection. Negative results do not preclude SARS-CoV-2 infection, do not rule out co-infections with other pathogens, and should not be used as the sole basis for treatment or other patient management decisions. Negative results must be combined with clinical observations, patient history, and epidemiological information. The expected result is Negative.  Fact Sheet for Patients: SugarRoll.be  Fact Sheet for Healthcare Providers: https://www.woods-mathews.com/  This test is not yet approved or cleared by the [place] FDA and  has been authorized for detection and/or diagnosis of SARS-CoV-2 by FDA under an Emergency Use Authorization (EUA). This EUA will remain  in effect  (meaning this test can be used) for the duration of the COVID-19 declaration under Se ction 564(b)(1) of the Act, 21 U.S.C. section 360bbb-3(b)(1), unless the authorization is terminated or revoked sooner.  Performed at [facility], [place] [address], [place], [place] [address]   Culture, blood (routine x 2)     Status: None   Collection Time: 09/08/20  9:33 PM   Specimen: BLOOD  Result Value Ref Range Status   Specimen Description   Final    BLOOD BLOOD RIGHT HAND Performed at [facility] [address] [place], [place] [address]    Special Requests   Final    BOTTLES DRAWN AEROBIC ONLY Blood Culture adequate volume Performed at [facility] [address] [place], [place] [address]    Culture   Final    NO GROWTH 5 DAYS Performed at [facility], [place] [address], [place], [place] [address]    Report Status 09/13/2020 FINAL  Final  Culture, blood (routine x 2)     Status: None   Collection Time: 09/08/20  9:33 PM   Specimen: BLOOD  Result Value Ref Range Status   Specimen Description   Final    BLOOD RIGHT WRIST Performed at [place] [address] [place], [place] [address]    Special Requests   Final    BOTTLES DRAWN AEROBIC ONLY Blood Culture adequate volume Performed at [facility] [address] [place], [place] [address]    Culture   Final    NO GROWTH 5 DAYS Performed at [facility]  Lab, [address] [place], [place] [address]    Report Status 09/13/2020 FINAL  Final    FURTHER DISCHARGE INSTRUCTIONS:  Get Medicines reviewed and adjusted: Please take all your medications with you for your next visit with your Primary MD  Laboratory/radiological data: Please request your Primary MD to go over all hospital tests and procedure/radiological results at the follow up, please ask your Primary MD to get all Hospital records sent to his/her office.  In some  cases, they will be blood work, cultures and biopsy results pending at the time of your discharge. Please request that your primary care M.D. goes through all the records of your hospital data and follows up on these results.  Also Note the following: If you experience worsening of your admission symptoms, develop shortness of breath, life threatening emergency, suicidal or homicidal thoughts you must seek medical attention immediately by calling 911 or calling your MD immediately  if symptoms less severe.  You must read complete instructions/literature along with all the possible adverse reactions/side effects for all the Medicines you take and that have been prescribed to you. Take any new Medicines after you have completely understood and accpet all the possible adverse reactions/side effects.   Do not drive when taking Pain medications or sleeping medications (Benzodaizepines)  Do not take more than prescribed Pain, Sleep and Anxiety Medications. It is not advisable to combine anxiety,sleep and pain medications without talking with your primary care practitioner  Special Instructions: If you have smoked or chewed Tobacco  in the last 2 yrs please stop smoking, stop any regular Alcohol  and or any Recreational drug use.  Wear Seat belts while driving.  Please note: You were cared for by a hospitalist during your hospital stay. Once you are discharged, your primary care physician will handle any further medical issues. Please note that NO REFILLS for any discharge medications will be authorized once you are discharged, as it is imperative that you return to your primary care physician (or establish a relationship with a primary care physician if you do not have one) for your post hospital discharge needs so that they can reassess your need for medications and monitor your lab values.  Total Time spent coordinating discharge including counseling, education and face to face time equals 35  minutes.  SignedOren Binet 09/14/2020 10:36 AM

## 2020-09-14 NOTE — TOC Progression Note (Addendum)
Transition of Care [facility]) - Progression Note    Patient Details  Name: Michelle Cain MRN: [id number] Date of Birth: 12/05/1965  Transition of Care [facility]) CM/SW Contact  Carles Collet, RN Phone Number: 09/14/2020, 10:04 AM  Clinical Narrative:   SPoke w patient at bedside. She states that she will be going home today, confirmed w MD.  Patient states that she has been on Lovenox in the past, described it as "Shot in my belly" and it was covered through her Medicaid. She states that she used [place] services a few months ago and want to use them again. CM will send orders when written.  Anticipate meds to be filled through TOC. Needs HH orders and face to face. RW to be delivered to the room Update- patient also requesting 3/1, ordered, will be delivered to room  Faxed order to [facility] at [phone]. Spoke w rep who is expecting fax    Expected Discharge Plan: [place] Barriers to Discharge: Continued Medical Work up  Expected Discharge Plan and Services Expected Discharge Plan: [place]   Discharge Planning Services: CM Consult Post Acute Care Choice: [place] arrangements for the past 2 months: Single Family Home                                   Representative spoke with at [place]: [place] (SDOH) Interventions    Readmission Risk Interventions No flowsheet data found.

## 2020-09-14 NOTE — Evaluation (Signed)
Occupational Therapy Evaluation/Discharge Patient Details Name: Michelle Cain MRN: [id number] DOB: [birth year] Today's Date: 09/14/2020    History of Present Illness Pt is a 55 y/o female admitted 5/25 secondary to increased SOB and syncopal episode. Found to have PE. Pt is s/p bilateral pulmonary thrombectomy and central venous catheter placement on 5/26. Pt is s/p IVC filter placement on 5/29. PMH includes gastric cancer and neuropathy.   Clinical Impression   PTA, pt lives with her mother and reports Modified Independence with ADLs and mobility using cane (noted different PLOF recall than was given to PT). Pt reports mother does most IADLs and limited activity due to SOB. Pt presents now appearing at baseline for ADLs, though limited mobility due to desire to return to bed. Pt able to demo transfer back to bed Independently. VSS on RA. Educated on energy conservation with pt reporting already implementing many strategies with ADLs/IADLs. Encouraged gradual progression of endurance during daily tasks to combat SOB with minimal activities. Anticipate no OT needs at DC though pt requesting RW for long distance mobility attempts. Encouraged pt to ambulate to/from bathroom and in hallway with staff while admitted. OT to sign off at acute level.    Follow Up Recommendations  No OT follow up;Supervision - Intermittent    Equipment Recommendations  Other (comment) (Rolling walker)    Recommendations for Other Services       Precautions / Restrictions Precautions Precautions: Fall Restrictions Weight Bearing Restrictions: No      Mobility Bed Mobility Overal bed mobility: Modified Independent Bed Mobility: Sit to Supine     Supine to sit: Supervision;HOB elevated Sit to supine: Modified independent (Device/Increase time)   General bed mobility comments: supervision for safety and line management    Transfers Overall transfer level: Independent Equipment used: None Transfers:  Sit to/from [employer] to Stand: Independent Stand pivot transfers: Independent       General transfer comment: no assist given and no LOB    Balance Overall balance assessment: Needs assistance Sitting-balance support: No upper extremity supported;Feet supported Sitting balance-Leahy Scale: Good     Standing balance support: No upper extremity supported;During functional activity;Single extremity supported Standing balance-Leahy Scale: Good                             ADL either performed or assessed with clinical judgement   ADL Overall ADL's : Modified independent                                       General ADL Comments: able to reach to feet for LB dressing tasks, stand and turn to get to bed without AD, no LOB noted. declined further activities. Educated on energy conservation strategies for ADLs/IADLs, and encouraged gradual progression of activity tolerance at home.     Vision Baseline Vision/History: No visual deficits Patient Visual Report: No change from baseline Vision Assessment?: No apparent visual deficits     Perception     Praxis      Pertinent Vitals/Pain Pain Assessment: No/denies pain     Hand Dominance Right   Extremity/Trunk Assessment Upper Extremity Assessment Upper Extremity Assessment: Overall WFL for tasks assessed   Lower Extremity Assessment Lower Extremity Assessment: Defer to PT evaluation   Cervical / Trunk Assessment Cervical / Trunk Assessment: Kyphotic   Communication Communication Communication: No difficulties   Cognition  Arousal/Alertness: Awake/alert Behavior During Therapy: Flat affect Overall Cognitive Status: Within Functional Limits for tasks assessed                                 General Comments: Appears WFL cognitively though noted different PLOF given to PT vs OT   General Comments  BP at start of session 111/97 on L LE (reports BP has been  taken on LE). Denies dizziness or lightheadedness with brief activity    Exercises     Shoulder Instructions      Home Living Family/patient expects to be discharged to:: Private residence Living Arrangements: Parent Available Help at Discharge: Family;Available 24 hours/day Type of Home: House Home Access: Stairs to enter [employer] of Steps: 1   Home Layout: One level     Bathroom Shower/Tub: Occupational psychologist: Standard     Home Equipment: Environmental consultant - 2 wheels;Cane - single point;Shower seat;Bedside commode;Wheelchair - manual;Hand held shower head          Prior Functioning/Environment Level of Independence: Needs assistance  Gait / Transfers Assistance Needed: Pt reports limited mobility and activity due to SOB ADL's / Homemaking Assistance Needed: Reports able to complete ADLs without assist. Difficulty with IADLs due to SOB but reports enjoying baking cakes - would sit for these tasks            OT Problem List:        OT Treatment/Interventions:      OT Goals(Current goals can be found in the care plan section) Acute Rehab OT Goals Patient Stated Goal: to go home OT Goal Formulation: All assessment and education complete, DC therapy  OT Frequency:     Barriers to D/C:            Co-evaluation              AM-PAC OT "6 Clicks" Daily Activity     Outcome Measure Help from another person eating meals?: None Help from another person taking care of personal grooming?: None Help from another person toileting, which includes using toliet, bedpan, or urinal?: None Help from another person bathing (including washing, rinsing, drying)?: None Help from another person to put on and taking off regular upper body clothing?: None Help from another person to put on and taking off regular lower body clothing?: None 6 Click Score: 24   End of Session Nurse Communication: Mobility status  Activity Tolerance: Patient tolerated  treatment well Patient left: in bed;with call bell/phone within reach;with bed alarm set  OT Visit Diagnosis: Other abnormalities of gait and mobility (R26.89)                Time: 7846-9629 OT Time Calculation (min): 14 min Charges:  OT General Charges $OT Visit: 1 Visit OT Evaluation $OT Eval Low Complexity: 1 Low  Malachy Chamber, OTR/L Acute Rehab Services Office: [phone]  Layla Maw 09/14/2020, 10:12 AM

## 2020-09-14 NOTE — TOC Benefit Eligibility Note (Signed)
Patient Michelle Cain, English as a foreign language completed.    The patient is currently admitted and upon discharge could be taking Enoxaparin 80mg /0.8 ml.  The current 30 day co-pay is, $0.00.   The patient is insured through [place], [place] Patient Advocate Specialist [place] Team Direct Number: [phone]  Fax: [phone]

## 2020-09-14 NOTE — Progress Notes (Signed)
ANTICOAGULATION CONSULT NOTE - Follow Up Consult  Pharmacy Consult for heparin Indication: pulmonary embolus  Patient Measurements: Weight: 80.2 kg (176 lb 12.9 oz) Heparin Dosing Weight: 71 kg  Labs: Recent Labs    09/12/20 0731 09/12/20 1130 09/13/20 0127 09/13/20 0656 09/13/20 1251 09/14/20 0131  HGB 7.0* 6.0* 10.2*  --   --  8.9*  HCT 22.4* 19.8* 31.2*  --   --  27.5*  PLT 126* 135* 128*  --   --  135*  HEPARINUNFRC 0.29*  --  0.18* 0.28* 0.40 0.28*  CREATININE 0.57  --   --   --   --   --     Assessment: 55 yo female presented on 09/07/2020 for near syncope. Patient found to have PE with RHS now s/p thrombectomy. Patient with PHM of gastric cancer. Patient also with PMH of DVT and recent EGD for hematemesis due to apixaban use which was stopped.   S/p thrombectomy on 5/26 for PE and now s/p IVC 5/29.  Heparin level is at goal. Plans to transition to lovenox if hg stable (6.0 on 5/29 and now 8.9)  Goal of Therapy:  Heparin level 0.3 - 0.5 unit/mL; No bolus Monitor platelets by anticoagulation protocol: Yes   Plan:  Heparin 500 units/hr Daily heparin level and CBC Copay for lovenox will be $0 (80mg  [place] q12h)  Hildred Laser, PharmD Clinical Pharmacist **Pharmacist phone directory can now be found on [email or web address] ([email or web address]).  Listed under [place].

## 2020-09-14 NOTE — Progress Notes (Addendum)
HEMATOLOGY-ONCOLOGY PROGRESS NOTE  SUBJECTIVE: Bowels moved yesterday. Reported stool was not bloody or black. Has hematuria which she reports is consistent with baseline. No complaint. Hoping to discharge today.   Oncology History  Malignant neoplasm of stomach ([place])  08/26/2019 Initial Diagnosis   Malignant neoplasm of stomach ([place])   09/04/2019 -  Chemotherapy    Patient is on Treatment Plan: GASTROESOPHAGEAL RAMUCIRUMAB D1, 15  / PACLITAXEL D1,8,15 Q28D      09/19/2019 Genetic Testing   Negative genetic testing on the common hereditary cancer panel.  The Common Hereditary Gene Panel offered by [facility] includes sequencing and/or deletion duplication testing of the following 48 genes: APC, ATM, AXIN2, BARD1, BMPR1A, BRCA1, BRCA2, BRIP1, CDH1, CDK4, CDKN2A (p14ARF), CDKN2A (p16INK4a), CHEK2, CTNNA1, DICER1, EPCAM (Deletion/duplication testing only), GREM1 (promoter region deletion/duplication testing only), KIT, MEN1, MLH1, MSH2, MSH3, MSH6, MUTYH, NBN, NF1, NHTL1, PALB2, PDGFRA, PMS2, POLD1, POLE, PTEN, RAD50, RAD51C, RAD51D, RNF43, SDHB, SDHC, SDHD, SMAD4, SMARCA4. STK11, TP53, TSC1, TSC2, and VHL.  The following genes were evaluated for sequence changes only: SDHA and HOXB13 c.251G>A variant only. The report date is September 19, 2019.     PHYSICAL EXAMINATION:  Vitals:   09/14/20 0329 09/14/20 0750  BP:  (!) 87/58  Pulse: 100 98  Resp:  14  Temp:  98.4 F (36.9 C)  SpO2:  99%   Filed Weights   09/08/20 1526 09/12/20 0442  Weight: 74.6 kg 80.2 kg    Intake/Output from previous day: 05/30 0701 - 05/31 0700 In: 360 [P.O.:360] Out: 250 [Urine:250]  GENERAL:alert, no distress and comfortable HEENT: No thrush LUNGS: clear to auscultation and percussion with normal breathing effort HEART: Regular rate and rhythm, no lower extremity edema ABDOMEN:abdomen soft, non-tender, no mass, no hepatomegaly and normal bowel sounds NEURO: alert & oriented x 3 with fluent speech, no focal  motor/sensory deficits  Port-A-Cath without erythema  LABORATORY DATA:  I have reviewed the data as listed CMP Latest Ref Rng & Units 09/12/2020 09/11/2020 09/09/2020  Glucose 70 - 99 mg/dL 97 124(H) 96  BUN 6 - 20 mg/dL _0 Creatinine 0.44 - 1.00 mg/dL 0.57 0.59 0.36(L)  Sodium 135 - 145 mmol/L 136 134(L) 136  Potassium 3.5 - 5.1 mmol/L 4.0 3.0(L) 3.9  Chloride 98 - 111 mmol/L 108 107 107  CO2 22 - 32 mmol/L 25 23 20(L)  Calcium 8.9 - 10.3 mg/dL 7.4(L) 7.4(L) 7.6(L)  Total Protein 6.5 - 8.1 g/dL 4.7(L) 4.5(L) 5.5(L)  Total Bilirubin 0.3 - 1.2 mg/dL 0.6 0.6 0.9  Alkaline Phos 38 - 126 U/L 63 60 79  AST 15 - 41 U/L _1 ALT 0 - 44 U/L _2 Lab Results  Component Value Date   WBC 6.7 09/14/2020   HGB 8.9 (L) 09/14/2020   HCT 27.5 (L) 09/14/2020   MCV 92.9 09/14/2020   PLT 135 (L) 09/14/2020   NEUTROABS 6.4 09/09/2020    CT ANGIO CHEST PE W OR WO CONTRAST  Result Date: 09/09/2020 CLINICAL DATA:  55 year old female with history of abnormal V/Q scan. History of breast cancer. EXAM: CT ANGIOGRAPHY CHEST WITH CONTRAST TECHNIQUE: Multidetector CT imaging of the chest was performed using the standard protocol during bolus administration of intravenous contrast. Multiplanar CT image reconstructions and MIPs were obtained to evaluate the vascular anatomy. CONTRAST:  147m OMNIPAQUE IOHEXOL 350 MG/ML SOLN COMPARISON:  Chest CTA 08/16/2020. FINDINGS: Cardiovascular: Numerous filling defects throughout the pulmonary arterial tree compatible with extensive pulmonary  embolus. Specifically, there are 2 large saddle emboli with extension into lobar, segmental and subsegmental sized branches in the lungs bilaterally. The majority of these emboli appear nonocclusive. Pulmonic trunk is currently nondilated measuring 2.3 cm in diameter. Right ventricle appears dilated with a diameter of approximately 52 mm. Left ventricular diameter is approximately 37 mm. RV to LV ratio of 1.41. There is no  significant pericardial fluid, thickening or pericardial calcification. No atherosclerotic calcifications in the thoracic aorta or the coronary arteries. Left-sided subclavian single-lumen porta cath with tip terminating in the right atrium. Mediastinum/Nodes: No pathologically enlarged mediastinal or hilar lymph nodes. Esophagus is unremarkable in appearance. No axillary lymphadenopathy. Lungs/Pleura: There are some patchy areas of ground-glass attenuation and mild septal thickening in the lower lobes of the lungs bilaterally, nonspecific, potentially predominantly atelectatic, although areas of mild alveolar hemorrhage are difficult to exclude. No confluent consolidative airspace disease. Trace left pleural effusion. No definite suspicious appearing pulmonary nodules or masses are noted. Upper Abdomen: Small amount of perinephric stranding adjacent to the left kidney, nonspecific. Musculoskeletal: There are no aggressive appearing lytic or blastic lesions noted in the visualized portions of the skeleton. Review of the MIP images confirms the above findings. IMPRESSION: 1. Positive for acute PE with CT evidence of right heart strain (RV/LV Ratio = 1.41) consistent with at least submassive (intermediate risk) PE. The presence of right heart strain has been associated with an increased risk of morbidity and mortality. Please refer to the "PE Focused" order set in EPIC. 2. Small amount of ground-glass attenuation and septal thickening in the lower lobes of the lungs bilaterally. The possibility of areas of mild alveolar hemorrhage is not excluded, although most of this is likely atelectatic. 3. Trace left pleural effusion. Critical Value/emergent results were discussed in person at the time of interpretation on 09/09/2020 at 3:44 pm to provider Dr. Ronny Bacon, who verbally acknowledged these results. Electronically Signed   By: Vinnie Langton M.D.   On: 09/09/2020 16:00   CT ANGIO CHEST PE W OR WO CONTRAST  Result  Date: 08/16/2020 CLINICAL DATA:  Shortness of breath and chest pain. History of breast, gastric, and colon cancer. EXAM: CT ANGIOGRAPHY CHEST WITH CONTRAST TECHNIQUE: Multidetector CT imaging of the chest was performed using the standard protocol during bolus administration of intravenous contrast. Multiplanar CT image reconstructions and MIPs were obtained to evaluate the vascular anatomy. CONTRAST:  174m OMNIPAQUE IOHEXOL 350 MG/ML SOLN COMPARISON:  Chest CT May 11, 2020 FINDINGS: Cardiovascular: There is no demonstrable pulmonary embolus. A vessel in the left lower lobe makes an acute turn causing a subtle defect on the axial images which is not confirmed on other images and is not felt to represent pulmonary embolus. There is no thoracic aortic aneurysm or dissection. Visualized great vessels appear unremarkable. Port-A-Cath tip is in the superior vena cava. No evident pericardial effusion or pericardial thickening. Mediastinum/Nodes: Status post left thyroidectomy. Remaining thyroid appears normal. No appreciable adenopathy. There remains circumferential thickening in the distal esophagus which is unchanged from prior study. This area may represent previous treated neoplasm but also may represent esophagitis. The appearance in this area is stable. Lungs/Pleura: There is slight bibasilar atelectasis. No evident edema or airspace opacity. No appreciable pleural effusions. Trachea and major bronchial structures appear patent. No evident pneumothorax. Upper Abdomen: Gallbladder is absent. Occasional presumed calcified granulomas in the spleen. Incomplete visualization of cyst arising from posterior right kidney measuring 0.8 x 0.8 cm. Musculoskeletal: No blastic or lytic bone lesions. Port present  on the left anteriorly. No chest wall lesions appreciable. Review of the MIP images confirms the above findings. IMPRESSION: 1. No demonstrable pulmonary embolus. No thoracic aortic aneurysm or dissection. 2.  Circumferential thickening distal esophagus again noted which may represent residua of previous treated neoplasm but also could represent a degree of esophagitis. 3.  No edema or airspace opacity.  Mild bibasilar atelectasis. 4.  No evident adenopathy. 5. Status post left-sided thyroidectomy. Status post cholecystectomy. 6.  Port-A-Cath tip in superior vena cava near cavoatrial junction. Electronically Signed   By: Lowella Grip III M.D.   On: 08/16/2020 13:31   NM Pulmonary Perfusion  Result Date: 09/09/2020 CLINICAL DATA:  Deep venous thrombosis, shortness of breath and chest pain for 3 days, elevated D-dimer, high clinical suspicion of pulmonary embolism EXAM: NUCLEAR MEDICINE PERFUSION LUNG SCAN TECHNIQUE: Perfusion images were obtained in multiple projections after intravenous injection of radiopharmaceutical. Ventilation scans intentionally deferred if perfusion scan and chest x-ray adequate for interpretation during COVID 19 epidemic. RADIOPHARMACEUTICALS:  4.25 mCi Tc-12mMAA IV COMPARISON:  Chest radiograph 09/08/2020 FINDINGS: Subsegmental perfusion defects identified within RIGHT middle lobe and lingula. Additional subsegmental perfusion defect LEFT lower lobe. Large multi segmental perfusion defect in RIGHT upper lobe. Findings are consistent with pulmonary embolism. Chest radiograph shows mild atelectasis or infiltrate in the RIGHT lower lobe, remaining lungs clear. IMPRESSION: Multiple BILATERAL pulmonary perfusion defects consistent with pulmonary embolism. Critical Value/emergent results were called by telephone at the time of interpretation on 09/09/2020 at 1200 hrs to provider Dr. CAileen Fass who verbally acknowledged these results. Electronically Signed   By: MLavonia DanaM.D.   On: 09/09/2020 12:00   IR Angiogram Pulmonary Bilateral Selective  Result Date: 09/10/2020 INDICATION: 55year old woman with massive pulmonary artery embolism presented with syncope and saddle PE. Interventional  radiology consulted for pulmonary thrombectomy/thrombolysis. Patient unable to receive systemic tPA due to bleeding gastric mass. EXAM: 1. Ultrasound-guided access of right internal jugular vein 2. Central venous catheter placement 3. Ultrasound-guided access of right common femoral vein 4. Bilateral pulmonary angiogram and mechanical thrombectomy COMPARISON:  CT angiography chest 06/12/2020 MEDICATIONS: None ANESTHESIA/SEDATION: Sedation performed and monitored by the anesthesia team FLUOROSCOPY TIME:  Fluoroscopy Time: 20 minutes 48 seconds (83 mGy). COMPLICATIONS: None immediate. TECHNIQUE: Informed written consent was obtained from the patient after a thorough discussion of the procedural risks, benefits and alternatives. All questions were addressed. Maximal Sterile Barrier Technique was utilized including caps, mask, sterile gowns, sterile gloves, sterile drape, hand hygiene and skin antiseptic. A timeout was performed prior to the initiation of the procedure. Patient positioned supine on the procedure table. Right neck and groin skin prepped and draped in usual fashion. The right internal jugular vein was evaluated with ultrasound and shown to be patent. A permanent ultrasound image was obtained and placed in the patient's medical record. Using sterile gel and a sterile probe cover, the right internal jugular vein was entered with a 21 ga needle during real time ultrasound guidance. 0.018 inch guidewire placed and 21 ga needle exchanged for transitional dilator set. Utilizing fluoroscopy, 0.035 inch guidewire advanced through the needle without difficulty. Serial dilation performed, and catheter inserted over the guidewire. The tip was positioned in the right atrium. All lumens of the catheter aspirated and flushed well. The catheter was secured to the skin with suture. The insertion site was covered with a Biopatch and sterile dressing. The right common femoral vein was evaluated with ultrasound and shown to  be patent. A permanent ultrasound image  was obtained and placed in the patient's medical record. Using sterile gel and a sterile probe cover the right common femoral vein was entered with a 21 gauge needle during real time ultrasound guidance. 21 gauge needle exchanged for transitional dilator set over 0.018 in guidewire. Transitional dilator set exchanged for 6 French sheath over 0.035 in guidewire. 6 Fr MIV double angle pigtail catheter advanced to the main pulmonary artery utilizing fluoroscopic guidance. Pre thrombectomy pulmonary artery pressure: 39/11 mmHg (mean 26 mmHg) Double angled pigtail catheter removed over exchange length Amplatz guidewire. Vert catheter utilized to advance Amplatz guidewire into right lower lobe pulmonary artery branch. Serial dilation was performed and 6 [place] sheath exchanged for [employer] 24 French sheath. 4 [place] FlowTriver device was advanced over the wire into the lateral basilar segmental and Truncus Anterior branches of the right pulmonary arterial system. Mechanical aspiration thrombectomy was performed extirpating thrombus. Post thrombectomy pulmonary angiogram demonstrated significant reduction in overall clot burden in the right pulmonary arterial branches. The left lateral basilar segmental branch of the lower lobe artery was selected with the [facility] catheter. The 20 [place] FlowTriver device was advanced through the 24 French device over the wire into the lateral basilar segmental branch of the left pulmonary arterial system. Mechanical aspiration thrombectomy was performed extirpating thrombus. Post aspiration pulmonary angiogram demonstrated significant reduction in overall clot burden. Post thrombectomy Pulmonary artery pressure: 27/7 mmHg (mean 15 mmHg) FlowTriever device was removed. The [place] Dry Seal sheath was removed and hemostasis achieved with purse-string suture and manual compression. IMPRESSION: 1. Bilateral pulmonary angiogram and mechanical  thrombectomy. Mean pulmonary artery pressure decreased from 26 mm Hg to 15 mm Hg following thrombectomy. 2. Right IJ non tunneled central line placement. Electronically Signed   By: Miachel Roux M.D.   On: 09/10/2020 12:35   IR IVC FILTER PLMT / S&I Burke Keels GUID/MOD SED  Result Date: 09/12/2020 INDICATION: DVT with contraindication to anticoagulation. Please perform IVC filter placement for caval interruption purposes. Given patient's multiple medical comorbidities, the IVC filter will be considered a permanent device in the patient will not be actively followed by the interventional radiology service for retrieval. EXAM: ULTRASOUND GUIDANCE FOR VASCULAR ACCESS IVC CATHETERIZATION AND VENOGRAM IVC FILTER INSERTION MEDICATIONS: None. ANESTHESIA/SEDATION: Fentanyl 100 mcg IV; Versed 2 mg IV Sedation Time: 11 minutes; The patient was continuously monitored during the procedure by the interventional radiology nurse under my direct supervision. CONTRAST:  None, CO2 was utilized for this examination FLUOROSCOPY TIME:  42 seconds (16 mGy) COMPLICATIONS: None immediate. PROCEDURE: Informed written consent was obtained from the patient following explanation of the procedure, risks, benefits and alternatives. A time out was performed prior to the initiation of the procedure. Given the presence of the existing right jugular approach central venous catheter and decision was made to proceed with IVC filter placement via the right common femoral vein. Maximal barrier sterile technique utilized including caps, mask, sterile gowns, sterile gloves, large sterile drape, hand hygiene, and Betadine prep. Under sterile condition and local anesthesia, right common femoral venous access was performed with ultrasound. An ultrasound image was saved and sent to PACS. Over a guidewire, the IVC filter delivery sheath and inner dilator were advanced into the IVC just above the IVC bifurcation. CO2 injection was performed for an IVC venogram.  Through the delivery sheath, a retrievable [place] IVC filter was deployed below the level of the renal veins and above the IVC bifurcation. The delivery sheath was removed and hemostasis was obtained with manual  compression. A dressing was placed. The patient tolerated the procedure well without immediate post procedural complication. FINDINGS: The IVC is patent. No evidence of thrombus, stenosis, or occlusion. No variant venous anatomy. Successful placement of the IVC filter below the level of the renal veins. IMPRESSION: Successful ultrasound and fluoroscopically guided placement of an infrarenal retrievable IVC filter. PLAN: Due to patient related comorbidities and/or clinical necessity, this IVC filter should be considered a permanent device. This patient will not be actively followed for future filter retrieval. Electronically Signed   By: Sandi Mariscal M.D.   On: 09/12/2020 07:02   IR Fluoro Guide CV Line Right  Result Date: 09/10/2020 INDICATION: 55 year old woman with massive pulmonary artery embolism presented with syncope and saddle PE. Interventional radiology consulted for pulmonary thrombectomy/thrombolysis. Patient unable to receive systemic tPA due to bleeding gastric mass. EXAM: 1. Ultrasound-guided access of right internal jugular vein 2. Central venous catheter placement 3. Ultrasound-guided access of right common femoral vein 4. Bilateral pulmonary angiogram and mechanical thrombectomy COMPARISON:  CT angiography chest 06/12/2020 MEDICATIONS: None ANESTHESIA/SEDATION: Sedation performed and monitored by the anesthesia team FLUOROSCOPY TIME:  Fluoroscopy Time: 20 minutes 48 seconds (83 mGy). COMPLICATIONS: None immediate. TECHNIQUE: Informed written consent was obtained from the patient after a thorough discussion of the procedural risks, benefits and alternatives. All questions were addressed. Maximal Sterile Barrier Technique was utilized including caps, mask, sterile gowns, sterile gloves,  sterile drape, hand hygiene and skin antiseptic. A timeout was performed prior to the initiation of the procedure. Patient positioned supine on the procedure table. Right neck and groin skin prepped and draped in usual fashion. The right internal jugular vein was evaluated with ultrasound and shown to be patent. A permanent ultrasound image was obtained and placed in the patient's medical record. Using sterile gel and a sterile probe cover, the right internal jugular vein was entered with a 21 ga needle during real time ultrasound guidance. 0.018 inch guidewire placed and 21 ga needle exchanged for transitional dilator set. Utilizing fluoroscopy, 0.035 inch guidewire advanced through the needle without difficulty. Serial dilation performed, and catheter inserted over the guidewire. The tip was positioned in the right atrium. All lumens of the catheter aspirated and flushed well. The catheter was secured to the skin with suture. The insertion site was covered with a Biopatch and sterile dressing. The right common femoral vein was evaluated with ultrasound and shown to be patent. A permanent ultrasound image was obtained and placed in the patient's medical record. Using sterile gel and a sterile probe cover the right common femoral vein was entered with a 21 gauge needle during real time ultrasound guidance. 21 gauge needle exchanged for transitional dilator set over 0.018 in guidewire. Transitional dilator set exchanged for 6 French sheath over 0.035 in guidewire. 6 Fr MIV double angle pigtail catheter advanced to the main pulmonary artery utilizing fluoroscopic guidance. Pre thrombectomy pulmonary artery pressure: 39/11 mmHg (mean 26 mmHg) Double angled pigtail catheter removed over exchange length Amplatz guidewire. Vert catheter utilized to advance Amplatz guidewire into right lower lobe pulmonary artery branch. Serial dilation was performed and 6 [place] sheath exchanged for [employer] 24 French sheath. 9  [place] FlowTriver device was advanced over the wire into the lateral basilar segmental and Truncus Anterior branches of the right pulmonary arterial system. Mechanical aspiration thrombectomy was performed extirpating thrombus. Post thrombectomy pulmonary angiogram demonstrated significant reduction in overall clot burden in the right pulmonary arterial branches. The left lateral basilar segmental branch of the  lower lobe artery was selected with the [facility] catheter. The 20 [place] FlowTriver device was advanced through the 24 French device over the wire into the lateral basilar segmental branch of the left pulmonary arterial system. Mechanical aspiration thrombectomy was performed extirpating thrombus. Post aspiration pulmonary angiogram demonstrated significant reduction in overall clot burden. Post thrombectomy Pulmonary artery pressure: 27/7 mmHg (mean 15 mmHg) FlowTriever device was removed. The [place] Dry Seal sheath was removed and hemostasis achieved with purse-string suture and manual compression. IMPRESSION: 1. Bilateral pulmonary angiogram and mechanical thrombectomy. Mean pulmonary artery pressure decreased from 26 mm Hg to 15 mm Hg following thrombectomy. 2. Right IJ non tunneled central line placement. Electronically Signed   By: Miachel Roux M.D.   On: 09/10/2020 12:35   IR THROMBECT PRIM MECH INIT (INCLU) MOD SED  Result Date: 09/10/2020 INDICATION: 55 year old woman with massive pulmonary artery embolism presented with syncope and saddle PE. Interventional radiology consulted for pulmonary thrombectomy/thrombolysis. Patient unable to receive systemic tPA due to bleeding gastric mass. EXAM: 1. Ultrasound-guided access of right internal jugular vein 2. Central venous catheter placement 3. Ultrasound-guided access of right common femoral vein 4. Bilateral pulmonary angiogram and mechanical thrombectomy COMPARISON:  CT angiography chest 06/12/2020 MEDICATIONS: None ANESTHESIA/SEDATION:  Sedation performed and monitored by the anesthesia team FLUOROSCOPY TIME:  Fluoroscopy Time: 20 minutes 48 seconds (83 mGy). COMPLICATIONS: None immediate. TECHNIQUE: Informed written consent was obtained from the patient after a thorough discussion of the procedural risks, benefits and alternatives. All questions were addressed. Maximal Sterile Barrier Technique was utilized including caps, mask, sterile gowns, sterile gloves, sterile drape, hand hygiene and skin antiseptic. A timeout was performed prior to the initiation of the procedure. Patient positioned supine on the procedure table. Right neck and groin skin prepped and draped in usual fashion. The right internal jugular vein was evaluated with ultrasound and shown to be patent. A permanent ultrasound image was obtained and placed in the patient's medical record. Using sterile gel and a sterile probe cover, the right internal jugular vein was entered with a 21 ga needle during real time ultrasound guidance. 0.018 inch guidewire placed and 21 ga needle exchanged for transitional dilator set. Utilizing fluoroscopy, 0.035 inch guidewire advanced through the needle without difficulty. Serial dilation performed, and catheter inserted over the guidewire. The tip was positioned in the right atrium. All lumens of the catheter aspirated and flushed well. The catheter was secured to the skin with suture. The insertion site was covered with a Biopatch and sterile dressing. The right common femoral vein was evaluated with ultrasound and shown to be patent. A permanent ultrasound image was obtained and placed in the patient's medical record. Using sterile gel and a sterile probe cover the right common femoral vein was entered with a 21 gauge needle during real time ultrasound guidance. 21 gauge needle exchanged for transitional dilator set over 0.018 in guidewire. Transitional dilator set exchanged for 6 French sheath over 0.035 in guidewire. 6 Fr MIV double angle pigtail  catheter advanced to the main pulmonary artery utilizing fluoroscopic guidance. Pre thrombectomy pulmonary artery pressure: 39/11 mmHg (mean 26 mmHg) Double angled pigtail catheter removed over exchange length Amplatz guidewire. Vert catheter utilized to advance Amplatz guidewire into right lower lobe pulmonary artery branch. Serial dilation was performed and 6 [place] sheath exchanged for [employer] 24 French sheath. 75 [place] FlowTriver device was advanced over the wire into the lateral basilar segmental and Truncus Anterior branches of the right pulmonary arterial system. Mechanical aspiration  thrombectomy was performed extirpating thrombus. Post thrombectomy pulmonary angiogram demonstrated significant reduction in overall clot burden in the right pulmonary arterial branches. The left lateral basilar segmental branch of the lower lobe artery was selected with the [facility] catheter. The 20 [place] FlowTriver device was advanced through the 24 French device over the wire into the lateral basilar segmental branch of the left pulmonary arterial system. Mechanical aspiration thrombectomy was performed extirpating thrombus. Post aspiration pulmonary angiogram demonstrated significant reduction in overall clot burden. Post thrombectomy Pulmonary artery pressure: 27/7 mmHg (mean 15 mmHg) FlowTriever device was removed. The Gore Dry Seal sheath was removed and hemostasis achieved with purse-string suture and manual compression. IMPRESSION: 1. Bilateral pulmonary angiogram and mechanical thrombectomy. Mean pulmonary artery pressure decreased from 26 mm Hg to 15 mm Hg following thrombectomy. 2. Right IJ non tunneled central line placement. Electronically Signed   By: Miachel Roux M.D.   On: 09/10/2020 12:35   IR US Guide Vasc Access Right  Result Date: 09/10/2020 INDICATION: 55 year old woman with massive pulmonary artery embolism presented with syncope and saddle PE. Interventional radiology consulted for  pulmonary thrombectomy/thrombolysis. Patient unable to receive systemic tPA due to bleeding gastric mass. EXAM: 1. Ultrasound-guided access of right internal jugular vein 2. Central venous catheter placement 3. Ultrasound-guided access of right common femoral vein 4. Bilateral pulmonary angiogram and mechanical thrombectomy COMPARISON:  CT angiography chest 06/12/2020 MEDICATIONS: None ANESTHESIA/SEDATION: Sedation performed and monitored by the anesthesia team FLUOROSCOPY TIME:  Fluoroscopy Time: 20 minutes 48 seconds (83 mGy). COMPLICATIONS: None immediate. TECHNIQUE: Informed written consent was obtained from the patient after a thorough discussion of the procedural risks, benefits and alternatives. All questions were addressed. Maximal Sterile Barrier Technique was utilized including caps, mask, sterile gowns, sterile gloves, sterile drape, hand hygiene and skin antiseptic. A timeout was performed prior to the initiation of the procedure. Patient positioned supine on the procedure table. Right neck and groin skin prepped and draped in usual fashion. The right internal jugular vein was evaluated with ultrasound and shown to be patent. A permanent ultrasound image was obtained and placed in the patient's medical record. Using sterile gel and a sterile probe cover, the right internal jugular vein was entered with a 21 ga needle during real time ultrasound guidance. 0.018 inch guidewire placed and 21 ga needle exchanged for transitional dilator set. Utilizing fluoroscopy, 0.035 inch guidewire advanced through the needle without difficulty. Serial dilation performed, and catheter inserted over the guidewire. The tip was positioned in the right atrium. All lumens of the catheter aspirated and flushed well. The catheter was secured to the skin with suture. The insertion site was covered with a Biopatch and sterile dressing. The right common femoral vein was evaluated with ultrasound and shown to be patent. A permanent  ultrasound image was obtained and placed in the patient's medical record. Using sterile gel and a sterile probe cover the right common femoral vein was entered with a 21 gauge needle during real time ultrasound guidance. 21 gauge needle exchanged for transitional dilator set over 0.018 in guidewire. Transitional dilator set exchanged for 6 French sheath over 0.035 in guidewire. 6 Fr MIV double angle pigtail catheter advanced to the main pulmonary artery utilizing fluoroscopic guidance. Pre thrombectomy pulmonary artery pressure: 39/11 mmHg (mean 26 mmHg) Double angled pigtail catheter removed over exchange length Amplatz guidewire. Vert catheter utilized to advance Amplatz guidewire into right lower lobe pulmonary artery branch. Serial dilation was performed and 6 [place] sheath exchanged for [employer] 24 [place]  sheath. 73 [place] FlowTriver device was advanced over the wire into the lateral basilar segmental and Truncus Anterior branches of the right pulmonary arterial system. Mechanical aspiration thrombectomy was performed extirpating thrombus. Post thrombectomy pulmonary angiogram demonstrated significant reduction in overall clot burden in the right pulmonary arterial branches. The left lateral basilar segmental branch of the lower lobe artery was selected with the [facility] catheter. The 20 [place] FlowTriver device was advanced through the 24 French device over the wire into the lateral basilar segmental branch of the left pulmonary arterial system. Mechanical aspiration thrombectomy was performed extirpating thrombus. Post aspiration pulmonary angiogram demonstrated significant reduction in overall clot burden. Post thrombectomy Pulmonary artery pressure: 27/7 mmHg (mean 15 mmHg) FlowTriever device was removed. The Gore Dry Seal sheath was removed and hemostasis achieved with purse-string suture and manual compression. IMPRESSION: 1. Bilateral pulmonary angiogram and mechanical thrombectomy. Mean  pulmonary artery pressure decreased from 26 mm Hg to 15 mm Hg following thrombectomy. 2. Right IJ non tunneled central line placement. Electronically Signed   By: Miachel Roux M.D.   On: 09/10/2020 12:35   IR US Guide Vasc Access Right  Result Date: 09/10/2020 INDICATION: 55 year old woman with massive pulmonary artery embolism presented with syncope and saddle PE. Interventional radiology consulted for pulmonary thrombectomy/thrombolysis. Patient unable to receive systemic tPA due to bleeding gastric mass. EXAM: 1. Ultrasound-guided access of right internal jugular vein 2. Central venous catheter placement 3. Ultrasound-guided access of right common femoral vein 4. Bilateral pulmonary angiogram and mechanical thrombectomy COMPARISON:  CT angiography chest 06/12/2020 MEDICATIONS: None ANESTHESIA/SEDATION: Sedation performed and monitored by the anesthesia team FLUOROSCOPY TIME:  Fluoroscopy Time: 20 minutes 48 seconds (83 mGy). COMPLICATIONS: None immediate. TECHNIQUE: Informed written consent was obtained from the patient after a thorough discussion of the procedural risks, benefits and alternatives. All questions were addressed. Maximal Sterile Barrier Technique was utilized including caps, mask, sterile gowns, sterile gloves, sterile drape, hand hygiene and skin antiseptic. A timeout was performed prior to the initiation of the procedure. Patient positioned supine on the procedure table. Right neck and groin skin prepped and draped in usual fashion. The right internal jugular vein was evaluated with ultrasound and shown to be patent. A permanent ultrasound image was obtained and placed in the patient's medical record. Using sterile gel and a sterile probe cover, the right internal jugular vein was entered with a 21 ga needle during real time ultrasound guidance. 0.018 inch guidewire placed and 21 ga needle exchanged for transitional dilator set. Utilizing fluoroscopy, 0.035 inch guidewire advanced through the  needle without difficulty. Serial dilation performed, and catheter inserted over the guidewire. The tip was positioned in the right atrium. All lumens of the catheter aspirated and flushed well. The catheter was secured to the skin with suture. The insertion site was covered with a Biopatch and sterile dressing. The right common femoral vein was evaluated with ultrasound and shown to be patent. A permanent ultrasound image was obtained and placed in the patient's medical record. Using sterile gel and a sterile probe cover the right common femoral vein was entered with a 21 gauge needle during real time ultrasound guidance. 21 gauge needle exchanged for transitional dilator set over 0.018 in guidewire. Transitional dilator set exchanged for 6 French sheath over 0.035 in guidewire. 6 Fr MIV double angle pigtail catheter advanced to the main pulmonary artery utilizing fluoroscopic guidance. Pre thrombectomy pulmonary artery pressure: 39/11 mmHg (mean 26 mmHg) Double angled pigtail catheter removed over exchange length Amplatz guidewire. Vert  catheter utilized to advance Amplatz guidewire into right lower lobe pulmonary artery branch. Serial dilation was performed and 6 [place] sheath exchanged for [employer] 24 French sheath. 57 [place] FlowTriver device was advanced over the wire into the lateral basilar segmental and Truncus Anterior branches of the right pulmonary arterial system. Mechanical aspiration thrombectomy was performed extirpating thrombus. Post thrombectomy pulmonary angiogram demonstrated significant reduction in overall clot burden in the right pulmonary arterial branches. The left lateral basilar segmental branch of the lower lobe artery was selected with the [facility] catheter. The 20 [place] FlowTriver device was advanced through the 24 French device over the wire into the lateral basilar segmental branch of the left pulmonary arterial system. Mechanical aspiration thrombectomy was performed  extirpating thrombus. Post aspiration pulmonary angiogram demonstrated significant reduction in overall clot burden. Post thrombectomy Pulmonary artery pressure: 27/7 mmHg (mean 15 mmHg) FlowTriever device was removed. The [place] Dry Seal sheath was removed and hemostasis achieved with purse-string suture and manual compression. IMPRESSION: 1. Bilateral pulmonary angiogram and mechanical thrombectomy. Mean pulmonary artery pressure decreased from 26 mm Hg to 15 mm Hg following thrombectomy. 2. Right IJ non tunneled central line placement. Electronically Signed   By: Miachel Roux M.D.   On: 09/10/2020 12:35   DG Chest Port 1 View  Result Date: 09/08/2020 CLINICAL DATA:  Shortness of breath.  Syncopal episodes. EXAM: PORTABLE CHEST 1 VIEW COMPARISON:  09/07/2019 FINDINGS: Stable appearance of the left subclavian Port-A-Cath with the tip extending into the upper right atrium. There is a chronic kink in the port near the inferior aspect of the left clavicle. Lungs are clear. Heart size is normal. Trachea is midline. No acute bone abnormality. IMPRESSION: 1. No acute cardiopulmonary disease. 2. Stable appearance of the left subclavian port with a kink in the catheter near the left clavicle. 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 Electronically Signed   By: Markus Daft M.D.   On: 09/08/2020 13:00   ECHOCARDIOGRAM COMPLETE  Result Date: 09/09/2020    ECHOCARDIOGRAM REPORT   Patient Name:   MAN BONNEAU Date of Exam: 09/09/2020 Medical Rec #:  [id number]         Height:       64.0 in Accession #:    [id number]        Weight:       164.5 lb Date of Birth:  26-Aug-1965         BSA:          1.800 m Patient Age:    79 years          BP:           91/62 mmHg Patient Gender: F                 HR:           114 bpm. Exam Location:  Inpatient Procedure: 2D Echo, Cardiac Doppler and Color Doppler Indications:    Elevated troponin  History:        Patient has no prior history of  Echocardiogram examinations.                 Signs/Symptoms:Syncope and Elevated Troponins. Gastric cancer,                 breast cancer, anemia, Chemo.  Sonographer:    Dustin Flock Referring Phys: [id number] [place]  1. There is sinus tachycardia with severe  RV dilation and moderately reduced RV function. 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  2. Left ventricular ejection fraction, by estimation, is 60 to 65%. The left ventricle has normal function. The left ventricle has no regional wall motion abnormalities. Indeterminate diastolic filling due to E-A fusion. There is the interventricular septum is flattened in systole and diastole, consistent with right ventricular pressure and volume overload.  3. Right ventricular systolic function is moderately reduced. The right ventricular size is severely enlarged. There is mildly elevated pulmonary artery systolic pressure. The estimated right ventricular systolic pressure is 65.7 mmHg.  4. The mitral valve is grossly normal. No evidence of mitral valve regurgitation. No evidence of mitral stenosis.  5. The aortic valve is tricuspid. Aortic valve regurgitation is not visualized. No aortic stenosis is present.  6. The inferior vena cava is normal in size with greater than 50% respiratory variability, suggesting right atrial pressure of 3 mmHg. FINDINGS  Left Ventricle: Left ventricular ejection fraction, by estimation, is 60 to 65%. The left ventricle has normal function. The left ventricle has no regional wall motion abnormalities. The left ventricular internal cavity size was normal in size. There is  no left ventricular hypertrophy. The interventricular septum is flattened in systole and diastole, consistent with right ventricular pressure and volume overload. Indeterminate diastolic filling due to E-A fusion. Right Ventricle: The right ventricular size is severely enlarged. No increase in right ventricular wall thickness. Right  ventricular systolic function is moderately reduced. There is mildly elevated pulmonary artery systolic pressure. The tricuspid regurgitant velocity is 2.93 m/s, and with an assumed right atrial pressure of 3 mmHg, the estimated right ventricular systolic pressure is 84.6 mmHg. Left Atrium: Left atrial size was normal in size. Right Atrium: Right atrial size was normal in size. Pericardium: Trivial pericardial effusion is present. Mitral Valve: The mitral valve is grossly normal. No evidence of mitral valve regurgitation. No evidence of mitral valve stenosis. Tricuspid Valve: The tricuspid valve is grossly normal. Tricuspid valve regurgitation is mild . No evidence of tricuspid stenosis. Aortic Valve: The aortic valve is tricuspid. Aortic valve regurgitation is not visualized. No aortic stenosis is present. Pulmonic Valve: The pulmonic valve was grossly normal. Pulmonic valve regurgitation is not visualized. No evidence of pulmonic stenosis. Aorta: The aortic root and ascending aorta are structurally normal, with no evidence of dilitation. Venous: The inferior vena cava is normal in size with greater than 50% respiratory variability, suggesting right atrial pressure of 3 mmHg. IAS/Shunts: The atrial septum is grossly normal. Additional Comments: A venous catheter is visualized in the right atrium.  LEFT VENTRICLE PLAX 2D LVIDd:         3.20 cm     Diastology LVIDs:         2.40 cm     LV e' medial:    8.05 cm/s LV PW:         1.10 cm     LV E/e' medial:  6.2 LV IVS:        1.20 cm     LV e' lateral:   7.62 cm/s LVOT diam:     1.80 cm     LV E/e' lateral: 6.5 LV SV:         23 LV SV Index:   13 LVOT Area:     2.54 cm  LV Volumes (MOD) LV vol d, MOD A4C: 47.5 ml LV vol s, MOD A4C: 20.7 ml LV SV MOD A4C:     47.5 ml  RIGHT VENTRICLE RV Basal diam:  4.10 cm RV S prime:     8.27 cm/s TAPSE (M-mode): 1.6 cm LEFT ATRIUM             Index       RIGHT ATRIUM          Index LA diam:        2.30 cm 1.28 cm/m  RA Area:      9.61 cm LA Vol (A2C):   14.9 ml 8.28 ml/m  RA Volume:   21.80 ml 12.11 ml/m LA Vol (A4C):   25.8 ml 14.33 ml/m LA Biplane Vol: 21.4 ml 11.89 ml/m  AORTIC VALVE LVOT Vmax:   81.70 cm/s LVOT Vmean:  51.100 cm/s LVOT VTI:    0.092 m  AORTA Ao Root diam: 2.60 cm MITRAL VALVE               TRICUSPID VALVE MV Area (PHT): 6.22 cm    TR Peak grad:   34.3 mmHg MV Decel Time: 122 msec    TR Vmax:        293.00 cm/s MV E velocity: 49.80 cm/s MV A velocity: 78.20 cm/s  SHUNTS MV E/A ratio:  0.64        Systemic VTI:  0.09 m                            Systemic Diam: 1.80 cm Eleonore Chiquito MD Electronically signed by Eleonore Chiquito MD Signature Date/Time: 09/09/2020/12:04:31 PM    Final    VAS [place] LOWER EXTREMITY VENOUS (DVT)  Result Date: 09/09/2020  Lower Venous DVT Study Patient Name:  PRIMA RAYNER  Date of Exam:   09/09/2020 Medical Rec #: [id number]          Accession #:    [id number] Date of Birth: Jul 06, 1965          Patient Gender: F Patient Age:   62Y Exam Location:  [facility] Procedure:      VAS [place] LOWER EXTREMITY VENOUS (DVT) Referring Phys: [id number] Silvestre Moment [facility] --------------------------------------------------------------------------------  Indications: Pulmonary embolism.  Comparison Study: no prior Performing Technologist: Abram Sander RVS  Examination Guidelines: A complete evaluation includes B-mode imaging, spectral Doppler, color Doppler, and power Doppler as needed of all accessible portions of each vessel. Bilateral testing is considered an integral part of a complete examination. Limited examinations for reoccurring indications may be performed as noted. The reflux portion of the exam is performed with the patient in reverse Trendelenburg.  +---------+---------------+---------+-----------+----------+--------------+ RIGHT    CompressibilityPhasicitySpontaneityPropertiesThrombus Aging +---------+---------------+---------+-----------+----------+--------------+ CFV      Full            Yes      Yes                                 +---------+---------------+---------+-----------+----------+--------------+ SFJ      Full                                                        +---------+---------------+---------+-----------+----------+--------------+ FV Prox  Full                                                        +---------+---------------+---------+-----------+----------+--------------+  FV Mid   Full                                                        +---------+---------------+---------+-----------+----------+--------------+ FV DistalFull                                                        +---------+---------------+---------+-----------+----------+--------------+ PFV      Full                                                        +---------+---------------+---------+-----------+----------+--------------+ POP      Full           Yes      Yes                                 +---------+---------------+---------+-----------+----------+--------------+ PTV      Full                                                        +---------+---------------+---------+-----------+----------+--------------+ PERO     Full                                                        +---------+---------------+---------+-----------+----------+--------------+   +---------+---------------+---------+-----------+----------+-----------------+ LEFT     CompressibilityPhasicitySpontaneityPropertiesThrombus Aging    +---------+---------------+---------+-----------+----------+-----------------+ CFV      Full           Yes      Yes                                    +---------+---------------+---------+-----------+----------+-----------------+ SFJ      Full                                                           +---------+---------------+---------+-----------+----------+-----------------+ FV Prox  Full                                                            +---------+---------------+---------+-----------+----------+-----------------+ FV Mid   Full                                                           +---------+---------------+---------+-----------+----------+-----------------+  FV DistalFull                                                           +---------+---------------+---------+-----------+----------+-----------------+ PFV      Full                                                           +---------+---------------+---------+-----------+----------+-----------------+ POP      None           Yes      Yes                  Age Indeterminate +---------+---------------+---------+-----------+----------+-----------------+ PTV      None                                         Age Indeterminate +---------+---------------+---------+-----------+----------+-----------------+ PERO     None                                         Age Indeterminate +---------+---------------+---------+-----------+----------+-----------------+     Summary: RIGHT: - There is no evidence of deep vein thrombosis in the lower extremity.  - No cystic structure found in the popliteal fossa.  LEFT: - Findings consistent with age indeterminate deep vein thrombosis involving the left popliteal vein, left posterior tibial veins, and left peroneal veins. - No cystic structure found in the popliteal fossa.  *See table(s) above for measurements and observations. Electronically signed by Servando Snare MD on 09/09/2020 at 4:03:54 PM.    Final    VAS [place] UPPER EXTREMITY VENOUS DUPLEX  Result Date: 09/10/2020 UPPER VENOUS STUDY  Patient Name:  SHAWNISE PETERKIN  Date of Exam:   09/10/2020 Medical Rec #: [id number]          Accession #:    [id number] Date of Birth: Apr 12, 1966          Patient Gender: F Patient Age:   13Y Exam Location:  [facility] Procedure:      VAS [place] UPPER EXTREMITY VENOUS DUPLEX Referring Phys: [id number] [place] --------------------------------------------------------------------------------  Indications: Edema Limitations: Poor ultrasound/tissue interface, bandages and line. Comparison Study: No prior study Performing Technologist: Maudry Mayhew MHA, RDMS, RVT, RDCS  Examination Guidelines: A complete evaluation includes B-mode imaging, spectral Doppler, color Doppler, and power Doppler as needed of all accessible portions of each vessel. Bilateral testing is considered an integral part of a complete examination. Limited examinations for reoccurring indications may be performed as noted.  Right Findings: +----------+------------+---------+-----------+--------------+-----------------+ RIGHT     CompressiblePhasicitySpontaneous  Properties       Summary      +----------+------------+---------+-----------+--------------+-----------------+ IJV                      Yes       Yes                                    +----------+------------+---------+-----------+--------------+-----------------+  Subclavian    None                 No                         Acute       +----------+------------+---------+-----------+--------------+-----------------+ Axillary      None                 Yes      partially   Age Indeterminate                                           re-cannulated                   +----------+------------+---------+-----------+--------------+-----------------+ Brachial      Full                                                        +----------+------------+---------+-----------+--------------+-----------------+ Radial        Full                                                        +----------+------------+---------+-----------+--------------+-----------------+ Ulnar         Full                                                        +----------+------------+---------+-----------+--------------+-----------------+ Cephalic      Full                                                         +----------+------------+---------+-----------+--------------+-----------------+ Basilic       Full                                                        +----------+------------+---------+-----------+--------------+-----------------+  Left Findings: +----------+------------+---------+-----------+----------+-------+ LEFT      CompressiblePhasicitySpontaneousPropertiesSummary +----------+------------+---------+-----------+----------+-------+ Subclavian               Yes       Yes                      +----------+------------+---------+-----------+----------+-------+  Summary:  Right: Findings consistent with acute deep vein thrombosis involving the right subclavian vein. Findings consistent with age indeterminate deep vein thrombosis involving the right axillary vein.  Left: No evidence of thrombosis in the subclavian.  *See table(s) above for measurements and observations.  Diagnosing physician: Monica Martinez MD Electronically signed by Monica Martinez MD on 09/10/2020 at 4:32:26 PM.    Final  ASSESSMENT AND PLAN: 1. Gastric cancer ? Presenting with dysphagia spring 2019 ? 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 CTs revealed wall thickening and luminal narrowing of the colon at the hepatic flexure and cecum ? PET scan with no evidence of distant metastatic disease or abnormal uptake other than thickening of the distal esophagus and proximal stomach ? 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 CTs 10/23/2017-diffuse prominence of the gastric wall, especially the antrum, focal wall thickening of the distal ascending colon and hepatic flexure, thickening of the cecum, nonspecific haziness of the mesenteric fat in the pelvis, mild prominence of lymph nodes at the greater curvature of the stomach ? Treatment with FOLFOX beginning July 2019 ? CTs October 2019-stable disease, FOLFOX continued ? CTs December 2019-stable disease ? January 2020 treatment transition to Xeloda maintenance, care transition to [facility] ? CTs in June 2020 in September 2020-stable disease, Xeloda continued ? CTs 06/23/2019-nonspecific thickening of the GE junction, intraluminal bladder mass ? Cystoscopy-metastatic signet ring cell adenocarcinoma ? Cycle 1 day 1 ramucirumab/paclitaxel 08/19/2019(given atanother facility) ? Cycle 1 day 1 ramucirumab/paclitaxel 09/04/2019 ? Cycle 1 day 8 Taxol 09/10/19 ? Cycle 1 day 15 ramucirumab/paclitaxel 6/2/2021canceled d/t neutropenia ? Ramucirumab/pactlitaxelevery 2 weeks 09/24/2019 ? Ramucirumab/paclitaxel 10/08/2019 ? Ramucirumab/paclitaxel 10/22/2019 ? Ramucirumab/paclitaxel 11/05/2019 ? CTs 11/24/2019-unchanged thickening of the distal esophagus/upper stomach, gastric body and antrum. Unchanged thickening of the transverse colon at the hepatic flexure, unchanged thickening of the urinary bladder, no evidence of progressive disease ? Ramucirumab/paclitaxel 11/25/2019 ? Ramucirumab/paclitaxel 12/09/2019 ? Ramucirumab/paclitaxel 12/23/2019 ? Ramucirumab/paclitaxel9/21/2021 ? Ramucirumab/paclitaxel 01/20/2020 ? Ramucirumab/paclitaxel 02/04/2020 ? Ramucirumab/paclitaxel 02/17/2020 ? Ramucirumab/paclitaxel 03/02/2020 ? Ramucirumab/paclitaxel 03/16/2020 ? CTs  03/28/2020-stable thickening of the distal esophagus, GE junction, gastric body, and antrum. Decreased soft tissue thickening of the transverse colon at the hepatic flexure, no evidence of metastatic disease to the chest ? Ramucirumab/paclitaxel 03/30/2020 ? SBRT to the stomach 04/12/2020-05/06/2020, 37.5 Gray in 15 fractions ? Ramucirumab/paclitaxel 04/05/2020 ? Ramucirumab/paclitaxel 04/13/2020 ? Ramucirumab/paclitaxel 04/27/2020 ? Ramucirumab/paclitaxel 05/25/2020 ? Ramucirumab/paclitaxel 06/11/2020 ? Ramucirumab/paclitaxel 06/22/2020 ? Ramucirumab/paclitaxel 07/06/2020 ? Ramucirumab/paclitaxel 07/20/2020 ? CT abdomen/pelvis 08/03/2020-wall thickening at the distal esophagus and stomach body/antrum-progressive, indistinct stranding around the descending duodenum, porta hepatis, and retroperitoneum-stable, abnormal wall thickening at the left upper urinary bladder-stable ? Ramucirumab/paclitaxel 08/11/2020 ? Upper endoscopy 08/26/2020- gastric mucosa nearly completely replaced with cancer.  Most proximal aspect of the stomach was spared but starting at the proximal gastric body there was extensive, circumferential cancer that continued into the proximal duodenal bulb.  The tumor is ulcerated in some areas, edematous throughout, very friable with spontaneous oozing.  The lumen of the distal stomach was narrowed due to edematous malignant changes but this did not limit scope passage.  2.Left breast cancer 2008,pT1c,pN0, status post a left lumpectomy with adjuvant chemotherapy and radiation, ER positive, PR positive, HER-2 positive  3.Mixed connective tissue disease/SLE  4.Lower extremity deep vein thrombosis maintained on apixaban-placed on hold due to hematemesis, hematuria 08/19/2020.  See findings of upper endoscopy from 08/26/2020.  Dr. Ardis Hughs recommends against ever resuming a blood thinner.  5.Family history of multiple cancers including breast and ovarian cancer  6.Dysphagia and  intermittent vomiting secondary to #1  7.Hypertension  8.Peripheral neuropathy  9.Masslike fullness at the posterior right parotid/angle of the jaw  10. Dyspnea on exertion, ongoing for about a month, worsened after taxol/ramucirumab on 09/04/19 requiring ED visit on 5/23. CBC, CMP, troponin, BNP and chest xray negative  11.Anemia likely secondary to combination of chemotherapy and blood loss  2 units packed red blood cells 04/12/2020, 07/20/2020 12.Admission 05/11/2020 with pneumonia, CT chest revealed left upper and lower lobe opacities 13. Progressive abdominal pain beginning 07/29/2020-etiology unclear, likely related to progression of tumor in the stomach 14.  Massive PE with cor pulmonale May 2022 15.  Hospital admission 09/08/2020 massive syncope, PE, symptomatic anemia.  Bilateral pulmonary angiogram and mechanical thrombectomy 09/09/2020  IVC filter placement 09/12/2020  Ms. Bracey-Brady appears stable.  Hemoglobin overall stable at this time. She has her baseline hematuria, but no other gross bleeding. She has been maintained on heparin and will switch to Lovenox 1 mg/kg daily today. I discussed the difficult circumstance of both clotting and bleeding with her again this morning. The plan will be to discontinue anticoagulation if she has recurrent bleeding.  Will plan to proceed with Guardant 360 testing in our office once she is discharged.   We have again reached out to [facility] to arrange a virtual second opinion. Waiting to hear back from them.  Recommendations: 1.  Begin Lovenox 1 mg/kg daily starting today.  2.  We have again contacted GI oncology colleagues at [facility] to request a telehealth visit - waiting to hear back. 3.  Guardant 360 testing to be performed in our office.  4.  Okay to discharge from our standpoint. Keep follow-up 6/1 in our office.     LOS: 6 days   Mikey Bussing 09/14/20 Ms. Dante was interviewed and examined.  I discussed the  case with Dr. Sloan Leiter.  She appears stable.  She has hematuria and denies rectal bleeding.  The plan is to begin low-dose Lovenox anticoagulation.  She will return for an office visit as scheduled on 09/15/2020.  I contacted the GI oncology service at [facility] yesterday.  She is waiting to be scheduled for an appointment there.  I was present for greater than 50% of today's visit.  I performed medical decision making.  Julieanne Manson, MD

## 2020-09-14 NOTE — Progress Notes (Addendum)
Physical Therapy Treatment Patient Details Name: Michelle Cain MRN: [id number] DOB: 1966-01-29 Today's Date: 09/14/2020    History of Present Illness Pt is a 55 y/o female admitted 5/25 secondary to increased SOB and syncopal episode. Found to have PE. Pt is s/p bilateral pulmonary thrombectomy and central venous catheter placement on 5/26. Pt is s/p IVC filter placement on 5/29. PMH includes gastric cancer and neuropathy.    PT Comments    Pt received in bed, encouragement needed for participation in therapy as pt wanting to sleep in. Agreeable to OOB but declining ambulation. She required supervision bed mobility, supervision sit to stand, and min guard assist SPT without AD. Pt in recliner with feet elevated at end of session. Pt agreeable to stay OOB at least 2 hours. Pt reports she is feeling much better. No SOB noted with mobility.     Follow Up Recommendations  Home health PT;Supervision for mobility/OOB     Equipment Recommendations  None recommended by PT    Recommendations for Other Services       Precautions / Restrictions Precautions Precautions: Fall    Mobility  Bed Mobility Overal bed mobility: Needs Assistance Bed Mobility: Supine to Sit     Supine to sit: Supervision;HOB elevated     General bed mobility comments: supervision for safety and line management    Transfers Overall transfer level: Needs assistance Equipment used: None Transfers: Sit to/from Stand;Stand Pivot Transfers Sit to Stand: Supervision Stand pivot transfers: Min guard       General transfer comment: no physical assist, supervision/min guard for safety. No c/o dizziness. No LOB noted.  Ambulation/Gait             General Gait Details: Pt declining amb but reports ambulating to/from bathroom with nursing.   Stairs             Wheelchair Mobility    Modified Rankin (Stroke Patients Only)       Balance Overall balance assessment: Needs  assistance Sitting-balance support: No upper extremity supported;Feet supported Sitting balance-Leahy Scale: Good     Standing balance support: No upper extremity supported;During functional activity;Single extremity supported Standing balance-Leahy Scale: Fair                              Cognition Arousal/Alertness: Awake/alert Behavior During Therapy: WFL for tasks assessed/performed Overall Cognitive Status: Within Functional Limits for tasks assessed                                        Exercises      General Comments        Pertinent Vitals/Pain Pain Assessment: No/denies pain    Home Living                      Prior Function            PT Goals (current goals can now be found in the care plan section) Acute Rehab PT Goals Patient Stated Goal: to go home Progress towards PT goals: Progressing toward goals    Frequency    Min 3X/week      PT Plan Current plan remains appropriate    Co-evaluation              AM-PAC PT "6 Clicks" Mobility   Outcome Measure  Help needed turning from  your back to your side while in a flat bed without using bedrails?: None Help needed moving from lying on your back to sitting on the side of a flat bed without using bedrails?: None Help needed moving to and from a bed to a chair (including a wheelchair)?: A Little Help needed standing up from a chair using your arms (e.g., wheelchair or bedside chair)?: A Little Help needed to walk in hospital room?: A Little Help needed climbing 3-5 steps with a railing? : A Lot 6 Click Score: 19    End of Session   Activity Tolerance: Patient tolerated treatment well Patient left: in chair;with call bell/phone within reach Nurse Communication: Mobility status PT Visit Diagnosis: Other abnormalities of gait and mobility (R26.89);Muscle weakness (generalized) (M62.81)     Time: 4734-0370 PT Time Calculation (min) (ACUTE ONLY): 15  min  Charges:  $Therapeutic Activity: 8-22 mins                     Lorrin Goodell, PT  Office # [phone] Pager [phone]    [name] 09/14/2020, 9:36 AM

## 2020-09-14 NOTE — Telephone Encounter (Signed)
Per Richard at [facility], since patient is Medicaid out of state, referring office needs to obtain PA for consult visit. CPT code: 83291 NPI: [id number] Fax [phone]. Forwarded this information to Baxter Flattery in managed care.

## 2020-09-14 NOTE — Progress Notes (Signed)
Nutrition  Unable to complete scheduled telephone follow-up today. Chart reviewed. Patient is currently admitted to [facility]. Will reschedule nutrition follow-up as able pending discharge plans.

## 2020-09-15 ENCOUNTER — Inpatient Hospital Stay: Payer: Medicare Other

## 2020-09-15 ENCOUNTER — Inpatient Hospital Stay: Payer: Medicare Other | Attending: Oncology | Admitting: Oncology

## 2020-09-15 VITALS — Wt 173.3 lb

## 2020-09-15 DIAGNOSIS — D649 Anemia, unspecified: Secondary | ICD-10-CM | POA: Insufficient documentation

## 2020-09-15 DIAGNOSIS — R1310 Dysphagia, unspecified: Secondary | ICD-10-CM | POA: Diagnosis not present

## 2020-09-15 DIAGNOSIS — C169 Malignant neoplasm of stomach, unspecified: Secondary | ICD-10-CM

## 2020-09-15 DIAGNOSIS — Z95828 Presence of other vascular implants and grafts: Secondary | ICD-10-CM

## 2020-09-15 DIAGNOSIS — I10 Essential (primary) hypertension: Secondary | ICD-10-CM | POA: Insufficient documentation

## 2020-09-15 DIAGNOSIS — G629 Polyneuropathy, unspecified: Secondary | ICD-10-CM | POA: Diagnosis not present

## 2020-09-15 DIAGNOSIS — Z452 Encounter for adjustment and management of vascular access device: Secondary | ICD-10-CM | POA: Insufficient documentation

## 2020-09-15 LAB — CBC WITH DIFFERENTIAL (CANCER CENTER ONLY)
Abs Immature Granulocytes: 0.04 10*3/uL (ref 0.00–0.07)
Basophils Absolute: 0 10*3/uL (ref 0.0–0.1)
Basophils Relative: 0 %
Eosinophils Absolute: 0.3 10*3/uL (ref 0.0–0.5)
Eosinophils Relative: 5 %
HCT: 28 % — ABNORMAL LOW (ref 36.0–46.0)
Hemoglobin: 8.8 g/dL — ABNORMAL LOW (ref 12.0–15.0)
Immature Granulocytes: 1 %
Lymphocytes Relative: 13 %
Lymphs Abs: 0.9 10*3/uL (ref 0.7–4.0)
MCH: 29.6 pg (ref 26.0–34.0)
MCHC: 31.4 g/dL (ref 30.0–36.0)
MCV: 94.3 fL (ref 80.0–100.0)
Monocytes Absolute: 0.5 10*3/uL (ref 0.1–1.0)
Monocytes Relative: 8 %
Neutro Abs: 4.8 10*3/uL (ref 1.7–7.7)
Neutrophils Relative %: 73 %
Platelet Count: 195 10*3/uL (ref 150–400)
RBC: 2.97 MIL/uL — ABNORMAL LOW (ref 3.87–5.11)
RDW: 19.3 % — ABNORMAL HIGH (ref 11.5–15.5)
WBC Count: 6.5 10*3/uL (ref 4.0–10.5)
nRBC: 0.5 % — ABNORMAL HIGH (ref 0.0–0.2)

## 2020-09-15 MED ORDER — SODIUM CHLORIDE 0.9% FLUSH
10.0000 mL | Freq: Once | INTRAVENOUS | Status: AC
  Administered 2020-09-15: 10 mL
  Filled 2020-09-15: qty 10

## 2020-09-15 MED ORDER — HEPARIN SOD (PORK) LOCK FLUSH 100 UNIT/ML IV SOLN
500.0000 [IU] | Freq: Once | INTRAVENOUS | Status: AC
  Administered 2020-09-15: 500 [IU]
  Filled 2020-09-15: qty 5

## 2020-09-15 NOTE — Progress Notes (Signed)
[place] OFFICE PROGRESS NOTE   Diagnosis: Gastric cancer  INTERVAL HISTORY:   Ms. Bulluck was discharged from the hospital yesterday after admission with massive pulmonary embolism.  She was transfused with packed red blood cells for severe anemia.  She is now maintained on once daily Lovenox.  She denies hematemesis and rectal bleeding since discharge from the hospital.  She has "mild "hematuria.  No pain.  No new complaint. She has developed a rash from "tape "at the right arm. Objective:  Vital signs in last 24 hours:  There were no vitals taken for this visit.    Resp: Lungs clear bilaterally Cardio: Regular rate and rhythm GI: Nontender, no hepatomegaly, no mass Vascular: No leg edema Skin: Resolving ecchymosis at the right upper arm, erythematous rash at the right lower arm  Portacath/PICC-without erythema  Lab Results:  Lab Results  Component Value Date   WBC 6.5 09/15/2020   HGB 8.8 (L) 09/15/2020   HCT 28.0 (L) 09/15/2020   MCV 94.3 09/15/2020   PLT 195 09/15/2020   NEUTROABS 4.8 09/15/2020    CMP  Lab Results  Component Value Date   NA 136 09/12/2020   K 4.0 09/12/2020   CL 108 09/12/2020   CO2 25 09/12/2020   GLUCOSE 97 09/12/2020   BUN 10 09/12/2020   CREATININE 0.57 09/12/2020   CALCIUM 7.4 (L) 09/12/2020   PROT 4.7 (L) 09/12/2020   ALBUMIN 1.9 (L) 09/12/2020   AST 16 09/12/2020   ALT 8 09/12/2020   ALKPHOS 63 09/12/2020   BILITOT 0.6 09/12/2020   GFRNONAA >60 09/12/2020   GFRAA >60 01/06/2020    Lab Results  Component Value Date   CEA1 3.45 09/04/2019     Imaging:  IR IVC FILTER PLMT / S&I [facility] Keels GUID/MOD SED  Result Date: 09/12/2020 INDICATION: DVT with contraindication to anticoagulation. Please perform IVC filter placement for caval interruption purposes. Given patient's multiple medical comorbidities, the IVC filter will be considered a permanent device in the patient will not be actively followed by the  interventional radiology service for retrieval. EXAM: ULTRASOUND GUIDANCE FOR VASCULAR ACCESS IVC CATHETERIZATION AND VENOGRAM IVC FILTER INSERTION MEDICATIONS: None. ANESTHESIA/SEDATION: Fentanyl 100 mcg IV; Versed 2 mg IV Sedation Time: 11 minutes; The patient was continuously monitored during the procedure by the interventional radiology nurse under my direct supervision. CONTRAST:  None, CO2 was utilized for this examination FLUOROSCOPY TIME:  42 seconds (16 mGy) COMPLICATIONS: None immediate. PROCEDURE: Informed written consent was obtained from the patient following explanation of the procedure, risks, benefits and alternatives. A time out was performed prior to the initiation of the procedure. Given the presence of the existing right jugular approach central venous catheter and decision was made to proceed with IVC filter placement via the right common femoral vein. Maximal barrier sterile technique utilized including caps, mask, sterile gowns, sterile gloves, large sterile drape, hand hygiene, and Betadine prep. Under sterile condition and local anesthesia, right common femoral venous access was performed with ultrasound. An ultrasound image was saved and sent to PACS. Over a guidewire, the IVC filter delivery sheath and inner dilator were advanced into the IVC just above the IVC bifurcation. CO2 injection was performed for an IVC venogram. Through the delivery sheath, a retrievable Denali IVC filter was deployed below the level of the renal veins and above the IVC bifurcation. The delivery sheath was removed and hemostasis was obtained with manual compression. A dressing was placed. The patient tolerated the procedure well without immediate  post procedural complication. FINDINGS: The IVC is patent. No evidence of thrombus, stenosis, or occlusion. No variant venous anatomy. Successful placement of the IVC filter below the level of the renal veins. IMPRESSION: Successful ultrasound and fluoroscopically guided  placement of an infrarenal retrievable IVC filter. PLAN: Due to patient related comorbidities and/or clinical necessity, this IVC filter should be considered a permanent device. This patient will not be actively followed for future filter retrieval. Electronically Signed   By: Sandi Mariscal M.D.   On: 09/12/2020 07:02    Medications: I have reviewed the patient's current medications.   Assessment/Plan: 1.  Gastric cancer ? Presenting with dysphagia spring 2019 ? 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 CTs revealed wall thickening and luminal narrowing of the colon at the hepatic flexure and cecum ? PET scan with no evidence of distant metastatic disease or abnormal uptake other than thickening of the distal esophagus and proximal stomach ? 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 CTs 10/23/2017-diffuse prominence of the gastric wall, especially the antrum, focal wall thickening of the distal ascending colon and hepatic flexure, thickening of the cecum, nonspecific haziness of the mesenteric fat in the pelvis, mild prominence of lymph nodes at the greater curvature of the stomach ? Treatment with FOLFOX beginning July 2019 ? CTs October 2019-stable disease, FOLFOX continued ? CTs December 2019-stable disease ? January 2020 treatment transition to Xeloda maintenance, care  transition to [place] ? CTs in June 2020 in September 2020-stable disease, Xeloda continued ? CTs 06/23/2019-nonspecific thickening of the GE junction, intraluminal bladder mass ? Cystoscopy-metastatic signet ring cell adenocarcinoma ? Cycle 1 day 1 ramucirumab/paclitaxel 08/19/2019(given atanother facility) ? Cycle 1 day 1 ramucirumab/paclitaxel 09/04/2019 ? Cycle 1 day 8 Taxol 09/10/19 ? Cycle 1 day 15 ramucirumab/paclitaxel 6/2/2021canceled d/t neutropenia ? Ramucirumab/pactlitaxelevery 2 weeks 09/24/2019 ? Ramucirumab/paclitaxel 10/08/2019 ? Ramucirumab/paclitaxel 10/22/2019 ? Ramucirumab/paclitaxel 11/05/2019 ? CTs 11/24/2019-unchanged thickening of the distal esophagus/upper stomach, gastric body and antrum. Unchanged thickening of the transverse colon at the hepatic flexure, unchanged thickening of the urinary bladder, no evidence of progressive disease ? Ramucirumab/paclitaxel 11/25/2019 ? Ramucirumab/paclitaxel 12/09/2019 ? Ramucirumab/paclitaxel 12/23/2019 ? Ramucirumab/paclitaxel9/21/2021 ? Ramucirumab/paclitaxel 01/20/2020 ? Ramucirumab/paclitaxel 02/04/2020 ? Ramucirumab/paclitaxel 02/17/2020 ? Ramucirumab/paclitaxel 03/02/2020 ? Ramucirumab/paclitaxel 03/16/2020 ? CTs 03/28/2020-stable thickening of the distal esophagus, GE junction, gastric body, and antrum. Decreased soft tissue thickening of the transverse colon at the hepatic flexure, no evidence of metastatic disease to the chest ? Ramucirumab/paclitaxel 03/30/2020 ? SBRT to the stomach 04/12/2020-05/06/2020, 37.5 Gray in 15 fractions ? Ramucirumab/paclitaxel 04/05/2020 ? Ramucirumab/paclitaxel 04/13/2020 ? Ramucirumab/paclitaxel 04/27/2020 ? Ramucirumab/paclitaxel 05/25/2020 ? Ramucirumab/paclitaxel 06/11/2020 ? Ramucirumab/paclitaxel 06/22/2020 ? Ramucirumab/paclitaxel 07/06/2020 ? Ramucirumab/paclitaxel 07/20/2020 ? CT abdomen/pelvis 08/03/2020-wall thickening at the distal esophagus and stomach  body/antrum-progressive, indistinct stranding around the descending duodenum, porta hepatis, and retroperitoneum-stable, abnormal wall thickening at the left upper urinary bladder-stable ? Ramucirumab/paclitaxel 08/11/2020 ? Upper endoscopy 08/26/2020-gastric mucosa nearly completely replaced with cancer. Most proximal aspect of the stomach was spared but starting at the proximal gastric body  there was extensive, circumferential cancer that continued into the proximal duodenal bulb. The tumor is ulcerated in some areas, edematous throughout, very friable with spontaneous oozing. The lumen of the distal stomach was narrowed due to edematous malignant changes but this did not limit scope passage.  2.Left breast cancer 2008,pT1c,pN0, status post a left lumpectomy with adjuvant chemotherapy and radiation, ER positive, PR positive, HER-2 positive  3.Mixed connective tissue disease/SLE  4.Lower extremity deep vein thrombosis maintained on apixaban-placed on hold due to hematemesis, hematuria 08/19/2020.See findings of upper endoscopy from 08/26/2020. Dr. Ardis Hughs recommends against ever resuming a blood thinner.  5.Family history of multiple cancers including breast and ovarian cancer  6.Dysphagia and intermittent vomiting secondary to #1  7.Hypertension  8.Peripheral neuropathy  9.Masslike fullness at the posterior right parotid/angle of the jaw  10. Dyspnea on exertion, ongoing for about a month, worsened after taxol/ramucirumab on 09/04/19 requiring ED visit on 5/23. CBC, CMP, troponin, BNP and chest xray negative  11.Anemia likely secondary to combination of chemotherapy and blood loss  2 units packed red blood cells 04/12/2020, 07/20/2020 12.Admission 05/11/2020 with pneumonia, CT chest revealed left upper and lower lobe opacities 13. Progressive abdominal pain beginning 07/29/2020-etiology unclear, likely related to progression of tumor in the stomach 14.   Massive PE with cor pulmonale May 2022 15.  Hospital admission 09/08/2020 massive syncope, PE, symptomatic anemia.  Bilateral pulmonary angiogram and mechanical thrombectomy 09/09/2020  IVC filter placement 09/12/2020    Disposition: Ms. Goebel appears stable.  The hemoglobin is unchanged following discharge from the hospital yesterday.  She will continue Lovenox anticoagulation.  She will call for bleeding or new symptoms.  She will be scheduled for a second opinion appointment at [facility] for a soon as possible.  She will return for a CBC in 1 week and an office visit here in 2 weeks.  We submitted peripheral blood for guardant 360 testing today.  Betsy Coder, MD  09/15/2020  3:01 PM

## 2020-09-17 ENCOUNTER — Telehealth: Payer: Self-pay | Admitting: Radiation Oncology

## 2020-09-17 NOTE — Telephone Encounter (Signed)
Rec'd a record request from Dr. Marily Memos at [facility] for radiation records and dosimetry records to be emailed to [email or web address]. I have faxed the radiation records and given the request to dosimetry to send theirs.

## 2020-09-20 DIAGNOSIS — Z452 Encounter for adjustment and management of vascular access device: Secondary | ICD-10-CM | POA: Diagnosis not present

## 2020-09-20 DIAGNOSIS — R1310 Dysphagia, unspecified: Secondary | ICD-10-CM | POA: Diagnosis not present

## 2020-09-20 DIAGNOSIS — G629 Polyneuropathy, unspecified: Secondary | ICD-10-CM | POA: Diagnosis not present

## 2020-09-20 DIAGNOSIS — I10 Essential (primary) hypertension: Secondary | ICD-10-CM | POA: Diagnosis not present

## 2020-09-20 DIAGNOSIS — D649 Anemia, unspecified: Secondary | ICD-10-CM | POA: Diagnosis not present

## 2020-09-20 DIAGNOSIS — C169 Malignant neoplasm of stomach, unspecified: Secondary | ICD-10-CM | POA: Diagnosis not present

## 2020-09-20 LAB — SAMPLE TO BLOOD BANK

## 2020-09-22 ENCOUNTER — Inpatient Hospital Stay: Payer: Medicare Other

## 2020-09-22 ENCOUNTER — Telehealth (HOSPITAL_COMMUNITY): Payer: Self-pay | Admitting: Pharmacist

## 2020-09-22 NOTE — Telephone Encounter (Signed)
Patient's voicemail is full.  Will follow up in a day or so.

## 2020-09-24 ENCOUNTER — Telehealth: Payer: Self-pay | Admitting: *Deleted

## 2020-09-24 DIAGNOSIS — C169 Malignant neoplasm of stomach, unspecified: Secondary | ICD-10-CM

## 2020-09-24 LAB — GUARDANT 360

## 2020-09-24 NOTE — Telephone Encounter (Signed)
Patient being d/c'd from [facility] today and needs CBC/CMP/Mg+ on Monday. Also wanted MD aware that she will have had #4 fractions of RT before she is seen on 09/29/20. High priority scheduling message sent to schedule.

## 2020-09-27 ENCOUNTER — Telehealth (HOSPITAL_COMMUNITY): Payer: Self-pay | Admitting: Pharmacist

## 2020-09-27 ENCOUNTER — Other Ambulatory Visit: Payer: Medicare Other

## 2020-09-27 NOTE — Telephone Encounter (Signed)
No answer, VM full.  Will call again another day.

## 2020-09-28 ENCOUNTER — Telehealth: Payer: Self-pay | Admitting: *Deleted

## 2020-09-28 NOTE — Telephone Encounter (Signed)
Called patient to request she come to office on 09/30/20 at 10:15 for lab/flush and see Dr. Benay Spice in person. She agrees to change. Scheduler notified to make change in system.

## 2020-09-29 ENCOUNTER — Inpatient Hospital Stay: Payer: Medicare Other | Admitting: Nurse Practitioner

## 2020-09-29 ENCOUNTER — Inpatient Hospital Stay: Payer: Medicare Other

## 2020-09-29 ENCOUNTER — Telehealth (HOSPITAL_COMMUNITY): Payer: Self-pay | Admitting: Pharmacist

## 2020-09-29 NOTE — Telephone Encounter (Signed)
Unable to reach patient, third and final attempt.

## 2020-09-30 ENCOUNTER — Inpatient Hospital Stay: Payer: Medicare Other

## 2020-09-30 ENCOUNTER — Other Ambulatory Visit: Payer: Self-pay

## 2020-09-30 ENCOUNTER — Encounter: Payer: Self-pay | Admitting: Oncology

## 2020-09-30 ENCOUNTER — Inpatient Hospital Stay (HOSPITAL_BASED_OUTPATIENT_CLINIC_OR_DEPARTMENT_OTHER): Payer: Medicare Other | Admitting: Oncology

## 2020-09-30 VITALS — BP 92/68 | HR 100 | Temp 98.0°F | Resp 18 | Ht 64.0 in | Wt 160.8 lb

## 2020-09-30 DIAGNOSIS — R1310 Dysphagia, unspecified: Secondary | ICD-10-CM | POA: Diagnosis not present

## 2020-09-30 DIAGNOSIS — C169 Malignant neoplasm of stomach, unspecified: Secondary | ICD-10-CM

## 2020-09-30 DIAGNOSIS — D649 Anemia, unspecified: Secondary | ICD-10-CM | POA: Diagnosis not present

## 2020-09-30 DIAGNOSIS — Z452 Encounter for adjustment and management of vascular access device: Secondary | ICD-10-CM | POA: Diagnosis not present

## 2020-09-30 DIAGNOSIS — Z95828 Presence of other vascular implants and grafts: Secondary | ICD-10-CM

## 2020-09-30 DIAGNOSIS — I10 Essential (primary) hypertension: Secondary | ICD-10-CM | POA: Diagnosis not present

## 2020-09-30 DIAGNOSIS — G629 Polyneuropathy, unspecified: Secondary | ICD-10-CM | POA: Diagnosis not present

## 2020-09-30 LAB — CBC WITH DIFFERENTIAL (CANCER CENTER ONLY)
Abs Immature Granulocytes: 0.07 10*3/uL (ref 0.00–0.07)
Basophils Absolute: 0 10*3/uL (ref 0.0–0.1)
Basophils Relative: 0 %
Eosinophils Absolute: 0.3 10*3/uL (ref 0.0–0.5)
Eosinophils Relative: 4 %
HCT: 32.9 % — ABNORMAL LOW (ref 36.0–46.0)
Hemoglobin: 10 g/dL — ABNORMAL LOW (ref 12.0–15.0)
Immature Granulocytes: 1 %
Lymphocytes Relative: 3 %
Lymphs Abs: 0.2 10*3/uL — ABNORMAL LOW (ref 0.7–4.0)
MCH: 29.1 pg (ref 26.0–34.0)
MCHC: 30.4 g/dL (ref 30.0–36.0)
MCV: 95.6 fL (ref 80.0–100.0)
Monocytes Absolute: 0.7 10*3/uL (ref 0.1–1.0)
Monocytes Relative: 10 %
Neutro Abs: 5.9 10*3/uL (ref 1.7–7.7)
Neutrophils Relative %: 82 %
Platelet Count: 222 10*3/uL (ref 150–400)
RBC: 3.44 MIL/uL — ABNORMAL LOW (ref 3.87–5.11)
RDW: 19.6 % — ABNORMAL HIGH (ref 11.5–15.5)
WBC Count: 7.2 10*3/uL (ref 4.0–10.5)
nRBC: 0 % (ref 0.0–0.2)

## 2020-09-30 LAB — CMP (CANCER CENTER ONLY)
ALT: 24 U/L (ref 0–44)
AST: 29 U/L (ref 15–41)
Albumin: 3.1 g/dL — ABNORMAL LOW (ref 3.5–5.0)
Alkaline Phosphatase: 73 U/L (ref 38–126)
Anion gap: 7 (ref 5–15)
BUN: 10 mg/dL (ref 6–20)
CO2: 26 mmol/L (ref 22–32)
Calcium: 8.5 mg/dL — ABNORMAL LOW (ref 8.9–10.3)
Chloride: 106 mmol/L (ref 98–111)
Creatinine: 0.39 mg/dL — ABNORMAL LOW (ref 0.44–1.00)
GFR, Estimated: >60 mL/min (ref >60)
Glucose, Bld: 147 mg/dL — ABNORMAL HIGH (ref 70–99)
Potassium: 3.7 mmol/L (ref 3.5–5.1)
Sodium: 139 mmol/L (ref 135–145)
Total Bilirubin: 0.4 mg/dL (ref 0.3–1.2)
Total Protein: 5.7 g/dL — ABNORMAL LOW (ref 6.5–8.1)

## 2020-09-30 LAB — MAGNESIUM: Magnesium: 1.8 mg/dL (ref 1.7–2.4)

## 2020-09-30 MED ORDER — SODIUM CHLORIDE 0.9% FLUSH
10.0000 mL | Freq: Once | INTRAVENOUS | Status: AC
  Administered 2020-09-30: 10 mL
  Filled 2020-09-30: qty 10

## 2020-09-30 MED ORDER — HEPARIN SOD (PORK) LOCK FLUSH 100 UNIT/ML IV SOLN
500.0000 [IU] | Freq: Once | INTRAVENOUS | Status: AC
Start: 2020-09-30 — End: 2020-09-30
  Administered 2020-09-30: 500 [IU]
  Filled 2020-09-30: qty 5

## 2020-09-30 NOTE — Progress Notes (Signed)
[place] OFFICE VISIT PROGRESS NOTE  I connected with Jennesis Halvorson on 09/30/20 at 11:00 AM EDT by video and verified that I am speaking with the correct person using two identifiers.   I discussed the limitations, risks, security and privacy concerns of performing an evaluation and management service by telemedicine and the availability of in-person appointments. I also discussed with the patient that there may be a patient responsible charge related to this service. The patient expressed understanding and agreed to proceed.  Other persons participating in the visit and their role in the encounter: Mother  Patient's location: Office Provider's location: Home   Diagnosis: Gastric cancer  INTERVAL HISTORY:   Ms. Buttram was admitted to [facility] on 09/15/2020 with hematemesis and anemia.  She was transfused with packed red blood cells.  Lovenox was tapered to 50 mg every 12 hours.  She completed 4 fractions of radiation to the stomach.  She developed an urticarial rash in the hospital.  She was restarted on Plaquenil and provided a prednisone taper.  She was discharged home on 09/24/2020.  She completed outpatient radiation.  She is scheduled to see Dr. Fanny Skates 10/07/2020. She denies recurrent bleeding.  She continues to have intermittent episodes of vomiting.  Her energy level has improved. Objective:  Vital signs in last 24 hours:  Blood pressure 92/68, pulse 100, temperature 98 F (36.7 C), temperature source Oral, resp. rate 18, height _0  (1.626 m), weight 160 lb 12.8 oz (72.9 kg), SpO2 100 %.     Lab Results:  Lab Results  Component Value Date   WBC 7.2 09/30/2020   HGB 10.0 (L) 09/30/2020   HCT 32.9 (L) 09/30/2020   MCV 95.6 09/30/2020   PLT 222 09/30/2020   NEUTROABS 5.9 09/30/2020    Imaging:  No results found.  Medications: I have reviewed the patient's current medications.  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, no loss of mismatch repair protein expression, HER-2 negative by FISH, GATA3 negative, ER negative CTs revealed wall thickening and luminal narrowing of the colon at the hepatic flexure and cecum PET scan with no evidence of distant metastatic disease or abnormal uptake other than thickening of the distal esophagus and proximal stomach Colonoscopy 10/05/2017 at [facility] revealed multiple foci of thickening/masses at the cecum, hepatic flexure, and distal rectum, biopsy from the cecum and hepatic flexure revealed metastatic adenocarcinoma of gastric origin, 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, PD-L1 combined positive score 2 on hepatic flexure biopsy CTs 10/23/2017-diffuse prominence of the gastric wall, especially the antrum, focal wall thickening of the distal ascending colon and hepatic flexure, thickening of the cecum, nonspecific haziness of the mesenteric fat in the pelvis, mild prominence of lymph nodes at the greater curvature of the stomach Treatment with FOLFOX beginning July 2019 CTs October 2019-stable disease, FOLFOX continued CTs December 2019-stable disease January 2020 treatment transition to Xeloda maintenance, care transition to [place] CTs in June 2020 in September 2020-stable disease, Xeloda continued CTs 06/23/2019-nonspecific thickening of the GE junction, intraluminal bladder mass Cystoscopy-metastatic signet ring cell adenocarcinoma Cycle 1 day 1 ramucirumab/paclitaxel 08/19/2019 (given at another facility) Cycle 1 day 1 ramucirumab/paclitaxel 09/04/2019 Cycle 1 day 8 Taxol 09/10/19 Cycle 1 day 15  ramucirumab/paclitaxel 09/17/2019 canceled d/t neutropenia Ramucirumab/pactlitaxel every 2 weeks 09/24/2019 Ramucirumab/paclitaxel 10/08/2019 Ramucirumab/paclitaxel 10/22/2019 Ramucirumab/paclitaxel 11/05/2019 CTs 11/24/2019-unchanged thickening of the distal esophagus/upper stomach, gastric body and antrum.  Unchanged thickening of the transverse colon at the hepatic flexure, unchanged thickening of the urinary bladder, no evidence of progressive disease Ramucirumab/paclitaxel 11/25/2019 Ramucirumab/paclitaxel 12/09/2019  Ramucirumab/paclitaxel 12/23/2019 Ramucirumab/paclitaxel 01/06/2020 Ramucirumab/paclitaxel 01/20/2020 Ramucirumab/paclitaxel 02/04/2020 Ramucirumab/paclitaxel 02/17/2020 Ramucirumab/paclitaxel 03/02/2020 Ramucirumab/paclitaxel 03/16/2020 CTs 03/28/2020-stable thickening of the distal esophagus, GE junction, gastric body, and antrum.  Decreased soft tissue thickening of the transverse colon at the hepatic flexure, no evidence of metastatic disease to the chest Ramucirumab/paclitaxel 03/30/2020 SBRT to the stomach 04/12/2020-05/06/2020, 37.5 Gray in 15 fractions Ramucirumab/paclitaxel 04/05/2020 Ramucirumab/paclitaxel 04/13/2020 Ramucirumab/paclitaxel 04/27/2020 Ramucirumab/paclitaxel 05/25/2020 Ramucirumab/paclitaxel 06/11/2020 Ramucirumab/paclitaxel 06/22/2020 Ramucirumab/paclitaxel 07/06/2020 Ramucirumab/paclitaxel 07/20/2020 CT abdomen/pelvis 08/03/2020- wall thickening at the distal esophagus and stomach body/antrum-progressive, indistinct stranding around the descending duodenum, porta hepatis, and retroperitoneum- stable, abnormal wall thickening at the left upper urinary bladder-stable Ramucirumab/paclitaxel 08/11/2020 Upper endoscopy 08/26/2020- gastric mucosa nearly completely replaced with cancer.  Most proximal aspect of the stomach was spared but starting at the proximal gastric body there was extensive, circumferential cancer that continued into the proximal duodenal bulb.  The tumor is  ulcerated in some areas, edematous throughout, very friable with spontaneous oozing.  The lumen of the distal stomach was narrowed due to edematous malignant changes but this did not limit scope passage. Palliative radiation to the stomach at [facility], 4 fractions completed 09/28/2020   2.  Left breast cancer 2008,pT1c,pN0, status post a left lumpectomy with adjuvant chemotherapy and radiation, ER positive, PR positive, HER-2 positive   3.  Mixed connective tissue disease/SLE   4.  Lower extremity deep vein thrombosis maintained on apixaban-placed on hold due to hematemesis, hematuria 08/19/2020.  See findings of upper endoscopy from 08/26/2020.  Dr. Ardis Hughs recommends against ever resuming a blood thinner.   5.  Family history of multiple cancers including breast and ovarian cancer   6.  Dysphagia and intermittent vomiting secondary to #1   7.  Hypertension   8.  Peripheral neuropathy   9.  Masslike fullness at the posterior right parotid/angle of the jaw   10. Dyspnea on exertion, ongoing for about a month, worsened after taxol/ramucirumab on 09/04/19 requiring ED visit on 5/23. CBC, CMP, troponin, BNP and chest xray negative    11.  Anemia likely secondary to combination of chemotherapy and blood loss 2 units packed red blood cells 04/12/2020, 07/20/2020 12.  Admission 05/11/2020 with pneumonia, CT chest revealed left upper and lower lobe opacities 13.  Progressive abdominal pain beginning 07/29/2020- etiology unclear, likely related to progression of tumor in the stomach 14.  Massive PE with cor pulmonale May 2022 15.  Hospital admission 09/08/2020 massive syncope, PE, symptomatic anemia. Bilateral pulmonary angiogram and mechanical thrombectomy 09/09/2020 IVC filter placement 09/12/2020 Lovenox adjusted to 50 mg every 12 hours while admitted at [facility] June 2022    Disposition: [name] appears stable.  The hemoglobin is higher after transfusion during the recent hospital admission at [facility].   She completed another course of palliative radiation to the stomach.  She is scheduled to see Dr.Uronis next week.  I will communicate with Dr. Fanny Skates regarding treatment options.  I reviewed pathology reports from the cancer treatment centers of [place] and the gastric and colon biopsies appear consistent with a gastric primary as opposed to recurrent breast cancer.  Guardant 360 testing was submitted when she was here 2 weeks ago.  I will follow-up on  this result and forward the report to Dr. Fanny Skates  She will return for an office and lab visit in 2 weeks.  She will call in the interim for new symptoms.  I discussed the assessment and treatment plan with the patient. The patient was provided an opportunity to ask questions and all were answered. The patient agreed with the plan and demonstrated an understanding of the instructions.   The patient was advised to call back or seek an in-person evaluation if the symptoms worsen or if the condition fails to improve as anticipated.  I provided 25 minutes of video, chart review, and documentation time during this encounter, and > 50% was spent counseling as documented under my assessment & plan.  Betsy Coder ANP/GNP-BC   09/30/2020 11:20 AM

## 2020-10-01 ENCOUNTER — Telehealth: Payer: Self-pay | Admitting: Oncology

## 2020-10-01 ENCOUNTER — Encounter: Payer: Self-pay | Admitting: Oncology

## 2020-10-01 NOTE — Telephone Encounter (Signed)
NO los 6/16

## 2020-10-05 ENCOUNTER — Encounter: Payer: Self-pay | Admitting: Oncology

## 2020-10-14 ENCOUNTER — Inpatient Hospital Stay: Payer: Medicare Other

## 2020-10-14 ENCOUNTER — Inpatient Hospital Stay: Payer: Medicare Other | Admitting: Nurse Practitioner

## 2020-10-15 ENCOUNTER — Inpatient Hospital Stay (HOSPITAL_BASED_OUTPATIENT_CLINIC_OR_DEPARTMENT_OTHER): Payer: Medicare Other | Admitting: Oncology

## 2020-10-15 ENCOUNTER — Inpatient Hospital Stay: Payer: Medicare Other

## 2020-10-15 ENCOUNTER — Other Ambulatory Visit: Payer: Self-pay | Admitting: *Deleted

## 2020-10-15 ENCOUNTER — Inpatient Hospital Stay: Payer: Medicare Other | Attending: Oncology

## 2020-10-15 ENCOUNTER — Other Ambulatory Visit: Payer: Self-pay

## 2020-10-15 VITALS — BP 100/67 | HR 82 | Temp 97.7°F | Resp 18 | Ht 64.0 in | Wt 161.2 lb

## 2020-10-15 DIAGNOSIS — C169 Malignant neoplasm of stomach, unspecified: Secondary | ICD-10-CM | POA: Diagnosis present

## 2020-10-15 DIAGNOSIS — I2609 Other pulmonary embolism with acute cor pulmonale: Secondary | ICD-10-CM | POA: Diagnosis not present

## 2020-10-15 DIAGNOSIS — I10 Essential (primary) hypertension: Secondary | ICD-10-CM | POA: Insufficient documentation

## 2020-10-15 DIAGNOSIS — Z853 Personal history of malignant neoplasm of breast: Secondary | ICD-10-CM | POA: Insufficient documentation

## 2020-10-15 DIAGNOSIS — D6481 Anemia due to antineoplastic chemotherapy: Secondary | ICD-10-CM | POA: Diagnosis not present

## 2020-10-15 DIAGNOSIS — Z7901 Long term (current) use of anticoagulants: Secondary | ICD-10-CM | POA: Diagnosis not present

## 2020-10-15 DIAGNOSIS — G629 Polyneuropathy, unspecified: Secondary | ICD-10-CM | POA: Diagnosis not present

## 2020-10-15 DIAGNOSIS — Z452 Encounter for adjustment and management of vascular access device: Secondary | ICD-10-CM | POA: Insufficient documentation

## 2020-10-15 DIAGNOSIS — Z95828 Presence of other vascular implants and grafts: Secondary | ICD-10-CM

## 2020-10-15 LAB — CBC WITH DIFFERENTIAL (CANCER CENTER ONLY)
Abs Immature Granulocytes: 0.01 10*3/uL (ref 0.00–0.07)
Basophils Absolute: 0 10*3/uL (ref 0.0–0.1)
Basophils Relative: 0 %
Eosinophils Absolute: 0.2 10*3/uL (ref 0.0–0.5)
Eosinophils Relative: 4 %
HCT: 24.1 % — ABNORMAL LOW (ref 36.0–46.0)
Hemoglobin: 7.2 g/dL — ABNORMAL LOW (ref 12.0–15.0)
Immature Granulocytes: 0 %
Lymphocytes Relative: 9 %
Lymphs Abs: 0.4 10*3/uL — ABNORMAL LOW (ref 0.7–4.0)
MCH: 28 pg (ref 26.0–34.0)
MCHC: 29.9 g/dL — ABNORMAL LOW (ref 30.0–36.0)
MCV: 93.8 fL (ref 80.0–100.0)
Monocytes Absolute: 0.4 10*3/uL (ref 0.1–1.0)
Monocytes Relative: 11 %
Neutro Abs: 2.8 10*3/uL (ref 1.7–7.7)
Neutrophils Relative %: 76 %
Platelet Count: 199 10*3/uL (ref 150–400)
RBC: 2.57 MIL/uL — ABNORMAL LOW (ref 3.87–5.11)
RDW: 18.3 % — ABNORMAL HIGH (ref 11.5–15.5)
WBC Count: 3.8 10*3/uL — ABNORMAL LOW (ref 4.0–10.5)
nRBC: 0.8 % — ABNORMAL HIGH (ref 0.0–0.2)

## 2020-10-15 LAB — CMP (CANCER CENTER ONLY)
ALT: 14 U/L (ref 0–44)
AST: 19 U/L (ref 15–41)
Albumin: 2.8 g/dL — ABNORMAL LOW (ref 3.5–5.0)
Alkaline Phosphatase: 81 U/L (ref 38–126)
Anion gap: 7 (ref 5–15)
BUN: 9 mg/dL (ref 6–20)
CO2: 27 mmol/L (ref 22–32)
Calcium: 8.4 mg/dL — ABNORMAL LOW (ref 8.9–10.3)
Chloride: 107 mmol/L (ref 98–111)
Creatinine: 0.47 mg/dL (ref 0.44–1.00)
GFR, Estimated: >60 mL/min (ref >60)
Glucose, Bld: 129 mg/dL — ABNORMAL HIGH (ref 70–99)
Potassium: 3.1 mmol/L — ABNORMAL LOW (ref 3.5–5.1)
Sodium: 141 mmol/L (ref 135–145)
Total Bilirubin: 0.2 mg/dL — ABNORMAL LOW (ref 0.3–1.2)
Total Protein: 5.7 g/dL — ABNORMAL LOW (ref 6.5–8.1)

## 2020-10-15 LAB — SAMPLE TO BLOOD BANK

## 2020-10-15 MED ORDER — OXYCODONE HCL 5 MG PO TABS: 5.0000 mg | ORAL_TABLET | Freq: Four times a day (QID) | ORAL | 0 refills | Status: DC | PRN

## 2020-10-15 MED ORDER — POTASSIUM CHLORIDE ER 10 MEQ PO CPCR: 10.0000 meq | ORAL_CAPSULE | Freq: Two times a day (BID) | ORAL | 1 refills | Status: DC

## 2020-10-15 MED ORDER — SODIUM CHLORIDE 0.9% FLUSH
10.0000 mL | Freq: Once | INTRAVENOUS | Status: AC
  Administered 2020-10-15: 10 mL
  Filled 2020-10-15: qty 10

## 2020-10-15 MED ORDER — HEPARIN SOD (PORK) LOCK FLUSH 100 UNIT/ML IV SOLN
500.0000 [IU] | Freq: Once | INTRAVENOUS | Status: AC
  Administered 2020-10-15: 500 [IU]
  Filled 2020-10-15: qty 5

## 2020-10-15 NOTE — Progress Notes (Signed)
[place] OFFICE PROGRESS NOTE   Diagnosis: Gastric cancer  INTERVAL HISTORY:   [name] returns as scheduled.  She is here today with her mother.  She has vomiting at the end of the day.  No hematemesis.  No hematuria.  She complains of discomfort at the left lower anterior chest.  No dyspnea.  Objective:  Vital signs in last 24 hours:  Blood pressure 100/67, pulse 82, temperature 97.7 F (36.5 C), temperature source Oral, resp. rate 18, height _0  (1.626 m), weight 161 lb 3.2 oz (73.1 kg), SpO2 98 %.   Resp: Lungs clear bilaterally Cardio: Regular rate and rhythm GI: No hepatosplenomegaly Vascular: No leg edema  Skin: Fading hyperpigmented flaking rash over the trunk and extremities  Portacath/PICC-without erythema  Lab Results:  Lab Results  Component Value Date   WBC 3.8 (L) 10/15/2020   HGB 7.2 (L) 10/15/2020   HCT 24.1 (L) 10/15/2020   MCV 93.8 10/15/2020   PLT 199 10/15/2020   NEUTROABS 2.8 10/15/2020    CMP  Lab Results  Component Value Date   NA 141 10/15/2020   K 3.1 (L) 10/15/2020   CL 107 10/15/2020   CO2 27 10/15/2020   GLUCOSE 129 (H) 10/15/2020   BUN 9 10/15/2020   CREATININE 0.47 10/15/2020   CALCIUM 8.4 (L) 10/15/2020   PROT 5.7 (L) 10/15/2020   ALBUMIN 2.8 (L) 10/15/2020   AST 19 10/15/2020   ALT 14 10/15/2020   ALKPHOS 81 10/15/2020   BILITOT 0.2 (L) 10/15/2020   GFRNONAA >60 10/15/2020   GFRAA >60 01/06/2020    Lab Results  Component Value Date   CEA1 3.45 09/04/2019    Medications: I have reviewed the patient's current medications.   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, no loss of mismatch repair protein expression, HER-2 negative by  FISH, GATA3 negative, ER negative CTs revealed wall thickening and luminal narrowing of the colon at the hepatic flexure and cecum PET scan with no evidence of distant metastatic disease or abnormal uptake other than thickening of the distal esophagus and proximal stomach Colonoscopy 10/05/2017 at [facility] revealed multiple foci of thickening/masses at the cecum, hepatic flexure, and distal rectum, biopsy from the cecum and hepatic flexure revealed metastatic adenocarcinoma of gastric origin, 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, PD-L1 combined positive score 2 on hepatic flexure biopsy CTs 10/23/2017-diffuse prominence of the gastric wall, especially the antrum, focal wall thickening of the distal ascending colon and hepatic flexure, thickening of the cecum, nonspecific haziness of the mesenteric fat in the pelvis, mild prominence of lymph nodes at the greater curvature of the stomach Treatment with FOLFOX beginning July 2019 CTs October 2019-stable disease, FOLFOX continued CTs December 2019-stable disease January 2020 treatment transition to Xeloda maintenance, care transition to [place] CTs in June 2020 in September 2020-stable disease, Xeloda continued CTs 06/23/2019-nonspecific thickening of the GE junction, intraluminal bladder mass Cystoscopy-metastatic signet ring cell adenocarcinoma Cycle 1 day 1 ramucirumab/paclitaxel 08/19/2019 (given at another facility) Cycle 1 day 1 ramucirumab/paclitaxel 09/04/2019 Cycle 1 day 8 Taxol 09/10/19 Cycle 1 day 15 ramucirumab/paclitaxel 09/17/2019 canceled d/t neutropenia Ramucirumab/pactlitaxel every 2 weeks 09/24/2019 Ramucirumab/paclitaxel 10/08/2019 Ramucirumab/paclitaxel 10/22/2019 Ramucirumab/paclitaxel 11/05/2019 CTs 11/24/2019-unchanged thickening of the distal esophagus/upper stomach, gastric body and  antrum.  Unchanged thickening of the transverse colon at the  hepatic flexure, unchanged thickening of the urinary bladder, no evidence of progressive disease Ramucirumab/paclitaxel 11/25/2019 Ramucirumab/paclitaxel 12/09/2019  Ramucirumab/paclitaxel 12/23/2019 Ramucirumab/paclitaxel 01/06/2020 Ramucirumab/paclitaxel 01/20/2020 Ramucirumab/paclitaxel 02/04/2020 Ramucirumab/paclitaxel 02/17/2020 Ramucirumab/paclitaxel 03/02/2020 Ramucirumab/paclitaxel 03/16/2020 CTs 03/28/2020-stable thickening of the distal esophagus, GE junction, gastric body, and antrum.  Decreased soft tissue thickening of the transverse colon at the hepatic flexure, no evidence of metastatic disease to the chest Ramucirumab/paclitaxel 03/30/2020 SBRT to the stomach 04/12/2020-05/06/2020, 37.5 Gray in 15 fractions Ramucirumab/paclitaxel 04/05/2020 Ramucirumab/paclitaxel 04/13/2020 Ramucirumab/paclitaxel 04/27/2020 Ramucirumab/paclitaxel 05/25/2020 Ramucirumab/paclitaxel 06/11/2020 Ramucirumab/paclitaxel 06/22/2020 Ramucirumab/paclitaxel 07/06/2020 Ramucirumab/paclitaxel 07/20/2020 CT abdomen/pelvis 08/03/2020- wall thickening at the distal esophagus and stomach body/antrum-progressive, indistinct stranding around the descending duodenum, porta hepatis, and retroperitoneum- stable, abnormal wall thickening at the left upper urinary bladder-stable Ramucirumab/paclitaxel 08/11/2020 Upper endoscopy 08/26/2020- gastric mucosa nearly completely replaced with cancer.  Most proximal aspect of the stomach was spared but starting at the proximal gastric body there was extensive, circumferential cancer that continued into the proximal duodenal bulb.  The tumor is ulcerated in some areas, edematous throughout, very friable with spontaneous oozing.  The lumen of the distal stomach was narrowed due to edematous malignant changes but this did not limit scope passage. Guardant 360 09/15/2020-CDH1,PIK3CA Palliative radiation to the stomach at [facility], 4 fractions completed 09/28/2020   2.  Left breast cancer  2008,pT1c,pN0, status post a left lumpectomy with adjuvant chemotherapy and radiation, ER positive, PR positive, HER-2 positive   3.  Mixed connective tissue disease/SLE   4.  Lower extremity deep vein thrombosis maintained on apixaban-placed on hold due to hematemesis, hematuria 08/19/2020.  See findings of upper endoscopy from 08/26/2020.  Dr. Ardis Hughs recommends against ever resuming a blood thinner.   5.  Family history of multiple cancers including breast and ovarian cancer   6.  Dysphagia and intermittent vomiting secondary to #1   7.  Hypertension   8.  Peripheral neuropathy   9.  Masslike fullness at the posterior right parotid/angle of the jaw   10. Dyspnea on exertion, ongoing for about a month, worsened after taxol/ramucirumab on 09/04/19 requiring ED visit on 5/23. CBC, CMP, troponin, BNP and chest xray negative    11.  Anemia likely secondary to combination of chemotherapy and blood loss 2 units packed red blood cells 04/12/2020, 07/20/2020 12.  Admission 05/11/2020 with pneumonia, CT chest revealed left upper and lower lobe opacities 13.  Progressive abdominal pain beginning 07/29/2020- etiology unclear, likely related to progression of tumor in the stomach 14.  Massive PE with cor pulmonale May 2022 15.  Hospital admission 09/08/2020 massive syncope, PE, symptomatic anemia. Bilateral pulmonary angiogram and mechanical thrombectomy 09/09/2020 IVC filter placement 09/12/2020 Lovenox adjusted to 50 mg every 12 hours while admitted at [facility] June 2022   Disposition: [name] appears unchanged.  She has metastatic gastric cancer.  She discussed treatment options with Dr. Fanny Skates last week.  Dr. Fanny Skates recommends a trial of single agent irinotecan given every 2 weeks.  [name] would like to proceed with irinotecan, but she does not wish to schedule treatment at present.  I reviewed potential toxicities associated with irinotecan including chance of hematologic toxicity,  nausea/vomiting, abdominal cramping, diarrhea, and alopecia.  She agrees to proceed.  She will be transfused with 2 units of packed red blood cells on 10/16/2020.  She will return for an office visit and further discussion on 10/22/2020.  We will proceed with getting insurance approval for irinotecan.  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  We will make sure she has germline testing for a CDH1 mutation.  We will also see whether she is a candidate for clinical trial based on the PIK3CA alteration.    Betsy Coder, MD  10/15/2020  12:58 PM

## 2020-10-15 NOTE — Addendum Note (Signed)
Addended by: Lenox Ponds E on: 10/15/2020 05:21 PM   Modules accepted: Orders, SmartSet

## 2020-10-15 NOTE — Progress Notes (Signed)
Referral order placed for genetics due to Gastric cancer with CDH1 alteration.  Faxed Guardant 360 results to Dr. Fanny Skates.  Called patient and confirmed she is not taking K+ and K+ level today is 3.1, Per Dr. Benay Spice start Eupora 10 meq bid.

## 2020-10-15 NOTE — Progress Notes (Signed)
DISCONTINUE ON PATHWAY REGIMEN - Gastroesophageal     A cycle is every 28 days:     Ramucirumab      Paclitaxel   **Always confirm dose/schedule in your pharmacy ordering system**  REASON: Disease Progression PRIOR TREATMENT: GEOS14: Ramucirumab 8 mg/kg Days 1, 15 + Paclitaxel 80 mg/m2 Days 1, 8, 15 q28 Days Until Progression or Unacceptable Toxicity TREATMENT RESPONSE: Progressive Disease (PD)  START OFF PATHWAY REGIMEN - Gastroesophageal   [id number]:Irinotecan 150 mg/m2 Days 1 and 15 q28 Days:   A cycle is every 28 days:     Irinotecan   **Always confirm dose/schedule in your pharmacy ordering system**  Patient Characteristics: Distant Metastases (cM1/pM1) / Locally Recurrent Disease, Adenocarcinoma - Esophageal, GE Junction, and Gastric, Third Line and Beyond, HER2 Negative/Unknown and MSS/pMMR or MSI Unknown Histology: Adenocarcinoma Disease Classification: Gastric Therapeutic Status: Distant Metastases (No Additional Staging) Line of Therapy: Third Engineer, civil (consulting) Status: Unknown HER2 Status: Negative Intent of Therapy: Non-Curative / Palliative Intent, Discussed with Patient

## 2020-10-16 ENCOUNTER — Other Ambulatory Visit: Payer: Self-pay

## 2020-10-16 ENCOUNTER — Inpatient Hospital Stay: Payer: Medicare Other

## 2020-10-16 DIAGNOSIS — C169 Malignant neoplasm of stomach, unspecified: Secondary | ICD-10-CM | POA: Diagnosis not present

## 2020-10-16 MED ORDER — DIPHENHYDRAMINE HCL 25 MG PO CAPS
25.0000 mg | ORAL_CAPSULE | Freq: Once | ORAL | Status: AC
Start: 2020-10-16 — End: 2020-10-16
  Administered 2020-10-16: 25 mg via ORAL

## 2020-10-16 MED ORDER — SODIUM CHLORIDE 0.9% IV SOLUTION
250.0000 mL | Freq: Once | INTRAVENOUS | Status: DC
  Filled 2020-10-16: qty 250

## 2020-10-16 MED ORDER — HEPARIN SOD (PORK) LOCK FLUSH 100 UNIT/ML IV SOLN
500.0000 [IU] | Freq: Every day | INTRAVENOUS | Status: AC | PRN
  Administered 2020-10-16: 500 [IU]
  Filled 2020-10-16: qty 5

## 2020-10-16 MED ORDER — DIPHENHYDRAMINE HCL 25 MG PO CAPS
ORAL_CAPSULE | ORAL | Status: AC
  Filled 2020-10-16: qty 1

## 2020-10-16 MED ORDER — SODIUM CHLORIDE 0.9% FLUSH
10.0000 mL | INTRAVENOUS | Status: AC | PRN
Start: 2020-10-16 — End: 2020-10-16
  Administered 2020-10-16: 10 mL
  Filled 2020-10-16: qty 10

## 2020-10-16 NOTE — Patient Instructions (Signed)
https://www.[facility].org/donate-blood/blood-donation-process/what-happens-to-donated-blood/blood-transfusions/types-of-blood-transfusions.html"> https://www.[employer].org/donate-blood/blood-donation-process/what-happens-to-donated-blood/blood-transfusions/risks-complications.html">  Blood Transfusion, Adult, Care After This sheet gives you information about how to care for yourself after your procedure. Your health care provider may also give you more specific instructions. If you have problems or questions, contact your health careprovider. What can I expect after the procedure? After the procedure, it is common to have: Bruising and soreness where the IV was inserted. A fever or chills on the day of the procedure. This may be your body's response to the new blood cells received. A headache. Follow these instructions at home: IV insertion site care     Follow instructions from your health care provider about how to take care of your IV insertion site. Make sure you: Wash your hands with soap and water before and after you change your bandage (dressing). If soap and water are not available, use hand sanitizer. Change your dressing as told by your health care provider. Check your IV insertion site every day for signs of infection. Check for: Redness, swelling, or pain. Bleeding from the site. Warmth. Pus or a bad smell. General instructions Take over-the-counter and prescription medicines only as told by your health care provider. Rest as told by your health care provider. Return to your normal activities as told by your health care provider. Keep all follow-up visits as told by your health care provider. This is important. Contact a health care provider if: You have itching or red, swollen areas of skin (hives). You feel anxious. You feel weak after doing your normal activities. You have redness, swelling, warmth, or pain around the IV insertion site. You have blood coming  from the IV insertion site that does not stop with pressure. You have pus or a bad smell coming from your IV insertion site. Get help right away if: You have symptoms of a serious allergic or immune system reaction, including: Trouble breathing or shortness of breath. Swelling of the face or feeling flushed. Fever or chills. Pain in the head, back, or chest. Dark urine or blood in the urine. Widespread rash. Fast heartbeat. Feeling dizzy or light-headed. If you receive your blood transfusion in an outpatient setting, you will betold whom to contact to report any reactions. These symptoms may represent a serious problem that is an emergency. Do not wait to see if the symptoms will go away. Get medical help right away. Call your local emergency services (911 in the [place] Do not drive yourself to the hospital. Summary Bruising and tenderness around the IV insertion site are common. Check your IV insertion site every day for signs of infection. Rest as told by your health care provider. Return to your normal activities as told by your health care provider. Get help right away for symptoms of a serious allergic or immune system reaction to blood transfusion. This information is not intended to replace advice given to you by your health care provider. Make sure you discuss any questions you have with your healthcare provider. Document Revised: 09/26/2018 Document Reviewed: 09/26/2018 Elsevier Patient Education  2022 [employer] Inc.  

## 2020-10-17 LAB — TYPE AND SCREEN
ABO/RH(D): A POS
Antibody Screen: NEGATIVE
Unit division: 0
Unit division: 0

## 2020-10-17 LAB — BPAM RBC
Blood Product Expiration Date: 202207292359
Blood Product Expiration Date: 202207292359
ISSUE DATE / TIME: 202207020828
ISSUE DATE / TIME: 202207020828
Unit Type and Rh: 6200
Unit Type and Rh: 6200

## 2020-10-19 ENCOUNTER — Telehealth: Payer: Self-pay | Admitting: Oncology

## 2020-10-19 LAB — PREPARE RBC (CROSSMATCH)

## 2020-10-19 NOTE — Telephone Encounter (Signed)
Called pt to sch genetics appt per 7/1 sch msg. Pt said she would call back to sch after she had a chance to look at her schedule.

## 2020-10-22 ENCOUNTER — Encounter: Payer: Self-pay | Admitting: Nurse Practitioner

## 2020-10-22 ENCOUNTER — Inpatient Hospital Stay: Payer: Medicare Other

## 2020-10-22 ENCOUNTER — Telehealth: Payer: Self-pay | Admitting: Nurse Practitioner

## 2020-10-22 ENCOUNTER — Inpatient Hospital Stay (HOSPITAL_BASED_OUTPATIENT_CLINIC_OR_DEPARTMENT_OTHER): Payer: Medicare Other | Admitting: Nurse Practitioner

## 2020-10-22 ENCOUNTER — Other Ambulatory Visit: Payer: Self-pay

## 2020-10-22 VITALS — BP 113/73 | HR 100 | Temp 97.8°F | Resp 18 | Ht 64.0 in | Wt 159.0 lb

## 2020-10-22 DIAGNOSIS — C169 Malignant neoplasm of stomach, unspecified: Secondary | ICD-10-CM

## 2020-10-22 DIAGNOSIS — Z95828 Presence of other vascular implants and grafts: Secondary | ICD-10-CM

## 2020-10-22 LAB — CMP (CANCER CENTER ONLY)
ALT: 24 U/L (ref 0–44)
AST: 39 U/L (ref 15–41)
Albumin: 2.9 g/dL — ABNORMAL LOW (ref 3.5–5.0)
Alkaline Phosphatase: 119 U/L (ref 38–126)
Anion gap: 8 (ref 5–15)
BUN: 8 mg/dL (ref 6–20)
CO2: 26 mmol/L (ref 22–32)
Calcium: 8.6 mg/dL — ABNORMAL LOW (ref 8.9–10.3)
Chloride: 102 mmol/L (ref 98–111)
Creatinine: 0.43 mg/dL — ABNORMAL LOW (ref 0.44–1.00)
GFR, Estimated: >60 mL/min (ref >60)
Glucose, Bld: 103 mg/dL — ABNORMAL HIGH (ref 70–99)
Potassium: 3.9 mmol/L (ref 3.5–5.1)
Sodium: 136 mmol/L (ref 135–145)
Total Bilirubin: 0.5 mg/dL (ref 0.3–1.2)
Total Protein: 5.9 g/dL — ABNORMAL LOW (ref 6.5–8.1)

## 2020-10-22 LAB — CBC WITH DIFFERENTIAL (CANCER CENTER ONLY)
Abs Immature Granulocytes: 0.01 10*3/uL (ref 0.00–0.07)
Basophils Absolute: 0 10*3/uL (ref 0.0–0.1)
Basophils Relative: 0 %
Eosinophils Absolute: 0.1 10*3/uL (ref 0.0–0.5)
Eosinophils Relative: 2 %
HCT: 35.2 % — ABNORMAL LOW (ref 36.0–46.0)
Hemoglobin: 10.8 g/dL — ABNORMAL LOW (ref 12.0–15.0)
Immature Granulocytes: 0 %
Lymphocytes Relative: 13 %
Lymphs Abs: 0.5 10*3/uL — ABNORMAL LOW (ref 0.7–4.0)
MCH: 27.6 pg (ref 26.0–34.0)
MCHC: 30.7 g/dL (ref 30.0–36.0)
MCV: 90 fL (ref 80.0–100.0)
Monocytes Absolute: 0.4 10*3/uL (ref 0.1–1.0)
Monocytes Relative: 11 %
Neutro Abs: 2.9 10*3/uL (ref 1.7–7.7)
Neutrophils Relative %: 74 %
Platelet Count: 184 10*3/uL (ref 150–400)
RBC: 3.91 MIL/uL (ref 3.87–5.11)
RDW: 17.7 % — ABNORMAL HIGH (ref 11.5–15.5)
WBC Count: 3.9 10*3/uL — ABNORMAL LOW (ref 4.0–10.5)
nRBC: 0 % (ref 0.0–0.2)

## 2020-10-22 LAB — SAMPLE TO BLOOD BANK

## 2020-10-22 MED ORDER — SODIUM CHLORIDE 0.9% FLUSH
10.0000 mL | Freq: Once | INTRAVENOUS | Status: AC
  Administered 2020-10-22: 10 mL
  Filled 2020-10-22: qty 10

## 2020-10-22 MED ORDER — ENOXAPARIN SODIUM 60 MG/0.6ML IJ SOSY: 50.0000 mg | PREFILLED_SYRINGE | Freq: Two times a day (BID) | INTRAMUSCULAR | 2 refills | Status: DC

## 2020-10-22 MED ORDER — HEPARIN SOD (PORK) LOCK FLUSH 100 UNIT/ML IV SOLN
500.0000 [IU] | Freq: Once | INTRAVENOUS | Status: AC
  Administered 2020-10-22: 500 [IU]
  Filled 2020-10-22: qty 5

## 2020-10-22 NOTE — Patient Instructions (Signed)
Implanted Port Home Guide An implanted port is a device that is placed under the skin. It is usually placed in the chest. The device can be used to give IV medicine, to take blood, or for dialysis. You may have an implanted port if: You need IV medicine that would be irritating to the small veins in your hands or arms. You need IV medicines, such as antibiotics, for a long period of time. You need IV nutrition for a long period of time. You need dialysis. When you have a port, your health care provider can choose to use the port instead of veins in your arms for these procedures. You may have fewer limitations when using a port than you would if you used other types of long-term IVs, and you will likely be able to return to normal activities afteryour incision heals. An implanted port has two main parts: Reservoir. The reservoir is the part where a needle is inserted to give medicines or draw blood. The reservoir is round. After it is placed, it appears as a small, raised area under your skin. Catheter. The catheter is a thin, flexible tube that connects the reservoir to a vein. Medicine that is inserted into the reservoir goes into the catheter and then into the vein. How is my port accessed? To access your port: A numbing cream may be placed on the skin over the port site. Your health care provider will put on a mask and sterile gloves. The skin over your port will be cleaned carefully with a germ-killing soap and allowed to dry. Your health care provider will gently pinch the port and insert a needle into it. Your health care provider will check for a blood return to make sure the port is in the vein and is not clogged. If your port needs to remain accessed to get medicine continuously (constant infusion), your health care provider will place a clear bandage (dressing) over the needle site. The dressing and needle will need to be changed every week, or as told by your health care provider. What  is flushing? Flushing helps keep the port from getting clogged. Follow instructions from your health care provider about how and when to flush the port. Ports are usually flushed with saline solution or a medicine called heparin. The need for flushing will depend on how the port is used: If the port is only used from time to time to give medicines or draw blood, the port may need to be flushed: Before and after medicines have been given. Before and after blood has been drawn. As part of routine maintenance. Flushing may be recommended every 4-6 weeks. If a constant infusion is running, the port may not need to be flushed. Throw away any syringes in a disposal container that is meant for sharp items (sharps container). You can buy a sharps container from a pharmacy, or you can make one by using an empty hard plastic bottle with a cover. How long will my port stay implanted? The port can stay in for as long as your health care provider thinks it is needed. When it is time for the port to come out, a surgery will be done to remove it. The surgery will be similar to the procedure that was done to putthe port in. Follow these instructions at home:  Flush your port as told by your health care provider. If you need an infusion over several days, follow instructions from your health care provider about how to take   care of your port site. Make sure you: Wash your hands with soap and water before you change your dressing. If soap and water are not available, use alcohol-based hand sanitizer. Change your dressing as told by your health care provider. Place any used dressings or infusion bags into a plastic bag. Throw that bag in the trash. Keep the dressing that covers the needle clean and dry. Do not get it wet. Do not use scissors or sharp objects near the tube. Keep the tube clamped, unless it is being used. Check your port site every day for signs of infection. Check for: Redness, swelling, or  pain. Fluid or blood. Pus or a bad smell. Protect the skin around the port site. Avoid wearing bra straps that rub or irritate the site. Protect the skin around your port from seat belts. Place a soft pad over your chest if needed. Bathe or shower as told by your health care provider. The site may get wet as long as you are not actively receiving an infusion. Return to your normal activities as told by your health care provider. Ask your health care provider what activities are safe for you. Carry a medical alert card or wear a medical alert bracelet at all times. This will let health care providers know that you have an implanted port in case of an emergency. Get help right away if: You have redness, swelling, or pain at the port site. You have fluid or blood coming from your port site. You have pus or a bad smell coming from the port site. You have a fever. Summary Implanted ports are usually placed in the chest for long-term IV access. Follow instructions from your health care provider about flushing the port and changing bandages (dressings). Take care of the area around your port by avoiding clothing that puts pressure on the area, and by watching for signs of infection. Protect the skin around your port from seat belts. Place a soft pad over your chest if needed. Get help right away if you have a fever or you have redness, swelling, pain, drainage, or a bad smell at the port site. This information is not intended to replace advice given to you by your health care provider. Make sure you discuss any questions you have with your healthcare provider. Document Revised: 08/18/2019 Document Reviewed: 08/18/2019 Elsevier Patient Education  2022 Elsevier Inc.  

## 2020-10-22 NOTE — Progress Notes (Signed)
[facility] OFFICE PROGRESS NOTE   Diagnosis: Gastric cancer  INTERVAL HISTORY:   [name] returns as scheduled.  She is feeling better following the recent blood transfusion.  She is not aware of recent bleeding.  Generalized pain is unchanged.  She denies shortness of breath.  No fever or cough.  She estimates vomiting 2 times a day.  This typically occurs after eating.  She did not vomit yesterday.  Her mother points out that she did not eat yesterday.  Objective:  Vital signs in last 24 hours:  Blood pressure 113/73, pulse 100, temperature 97.8 F (36.6 C), temperature source Oral, resp. rate 18, height _0  (1.626 m), weight 159 lb (72.1 kg), SpO2 100 %.   Resp: Lungs clear bilaterally. Cardio: Regular rate and rhythm. GI: Abdomen soft.  No hepatomegaly. Vascular: No leg edema. Neuro: Alert and oriented. Skin: Fading hyperpigmented rash over the trunk.  A few small subcutaneous nodules left and right abdominal wall, appear to be at sites of injections. Port-A-Cath without erythema.  Lab Results:  Lab Results  Component Value Date   WBC 3.9 (L) 10/22/2020   HGB 10.8 (L) 10/22/2020   HCT 35.2 (L) 10/22/2020   MCV 90.0 10/22/2020   PLT 184 10/22/2020   NEUTROABS 2.9 10/22/2020    Imaging:  No results found.  Medications: I have reviewed the patient's current medications.  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, no loss of mismatch repair protein expression, HER-2 negative by FISH, GATA3 negative, ER negative CTs revealed wall thickening and luminal narrowing of the colon at the hepatic flexure and cecum PET scan with no evidence of distant metastatic disease or abnormal uptake  other than thickening of the distal esophagus and proximal stomach Colonoscopy 10/05/2017 at [place] of [facility] revealed multiple foci of thickening/masses at the cecum, hepatic flexure, and distal rectum, biopsy from the cecum and hepatic flexure revealed metastatic adenocarcinoma of gastric origin, 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, PD-L1 combined positive score 2 on hepatic flexure biopsy CTs 10/23/2017-diffuse prominence of the gastric wall, especially the antrum, focal wall thickening of the distal ascending colon and hepatic flexure, thickening of the cecum, nonspecific haziness of the mesenteric fat in the pelvis, mild prominence of lymph nodes at the greater curvature of the stomach Treatment with FOLFOX beginning July 2019 CTs October 2019-stable disease, FOLFOX continued CTs December 2019-stable disease January 2020 treatment transition to Xeloda maintenance, care transition to [place] CTs in June 2020 in September 2020-stable disease, Xeloda continued CTs 06/23/2019-nonspecific thickening of the GE junction, intraluminal bladder mass Cystoscopy-metastatic signet ring cell adenocarcinoma Cycle 1 day 1 ramucirumab/paclitaxel 08/19/2019 (given at another facility) Cycle 1 day 1 ramucirumab/paclitaxel 09/04/2019 Cycle 1 day 8 Taxol 09/10/19 Cycle 1 day 15 ramucirumab/paclitaxel 09/17/2019 canceled d/t neutropenia Ramucirumab/pactlitaxel every 2 weeks 09/24/2019 Ramucirumab/paclitaxel 10/08/2019 Ramucirumab/paclitaxel 10/22/2019 Ramucirumab/paclitaxel 11/05/2019 CTs 11/24/2019-unchanged thickening of the distal esophagus/upper stomach, gastric body and antrum.  Unchanged thickening of the transverse colon at the hepatic flexure, unchanged thickening of the urinary bladder, no evidence of progressive disease Ramucirumab/paclitaxel 11/25/2019 Ramucirumab/paclitaxel 12/09/2019  Ramucirumab/paclitaxel  12/23/2019 Ramucirumab/paclitaxel 01/06/2020 Ramucirumab/paclitaxel 01/20/2020 Ramucirumab/paclitaxel 02/04/2020 Ramucirumab/paclitaxel 02/17/2020 Ramucirumab/paclitaxel 03/02/2020 Ramucirumab/paclitaxel 03/16/2020 CTs 03/28/2020-stable thickening of the distal esophagus, GE junction, gastric body, and antrum.  Decreased soft tissue  thickening of the transverse colon at the hepatic flexure, no evidence of metastatic disease to the chest Ramucirumab/paclitaxel 03/30/2020 SBRT to the stomach 04/12/2020-05/06/2020, 37.5 Gray in 15 fractions Ramucirumab/paclitaxel 04/05/2020 Ramucirumab/paclitaxel 04/13/2020 Ramucirumab/paclitaxel 04/27/2020 Ramucirumab/paclitaxel 05/25/2020 Ramucirumab/paclitaxel 06/11/2020 Ramucirumab/paclitaxel 06/22/2020 Ramucirumab/paclitaxel 07/06/2020 Ramucirumab/paclitaxel 07/20/2020 CT abdomen/pelvis 08/03/2020- wall thickening at the distal esophagus and stomach body/antrum-progressive, indistinct stranding around the descending duodenum, porta hepatis, and retroperitoneum- stable, abnormal wall thickening at the left upper urinary bladder-stable Ramucirumab/paclitaxel 08/11/2020 Upper endoscopy 08/26/2020- gastric mucosa nearly completely replaced with cancer.  Most proximal aspect of the stomach was spared but starting at the proximal gastric body there was extensive, circumferential cancer that continued into the proximal duodenal bulb.  The tumor is ulcerated in some areas, edematous throughout, very friable with spontaneous oozing.  The lumen of the distal stomach was narrowed due to edematous malignant changes but this did not limit scope passage. Guardant 360 09/15/2020-CDH1,PIK3CA Palliative radiation to the stomach at [facility], 4 fractions completed 09/28/2020   2.  Left breast cancer 2008,pT1c,pN0, status post a left lumpectomy with adjuvant chemotherapy and radiation, ER positive, PR positive, HER-2 positive   3.  Mixed connective tissue disease/SLE   4.  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  See findings of upper endoscopy from 08/26/2020.  Dr. Ardis Hughs recommends against ever resuming a blood thinner.   5.  Family history of multiple cancers including breast and ovarian cancer   6.  Dysphagia and intermittent vomiting secondary to #1   7.  Hypertension   8.  Peripheral neuropathy   9.  Masslike fullness at the posterior right parotid/angle of the jaw   10. Dyspnea on exertion, ongoing for about a month, worsened after taxol/ramucirumab on 09/04/19 requiring ED visit on 5/23. CBC, CMP, troponin, BNP and chest xray negative    11.  Anemia likely secondary to combination of chemotherapy and blood loss 2 units packed red blood cells 04/12/2020, 07/20/2020 12.  Admission 05/11/2020 with pneumonia, CT chest revealed left upper and lower lobe opacities 13.  Progressive abdominal pain beginning 07/29/2020- etiology unclear, likely related to progression of tumor in the stomach 14.  Massive PE with cor pulmonale May 2022 15.  Hospital admission 09/08/2020 massive syncope, PE, symptomatic anemia. Bilateral pulmonary angiogram and mechanical thrombectomy 09/09/2020 IVC filter placement 09/12/2020 Lovenox adjusted to 50 mg every 12 hours while admitted at [facility] June 2022      Disposition: Ms. Yard appears unchanged.  We again discussed treatment with single agent irinotecan given every 2 weeks.  We discussed potential toxicities.  She thinks she does want to proceed with treatment but is unsure when she wants to start.  She will contact our office next week with her decision.  We reviewed the CBC from today.  Hemoglobin is 10.8.  She had a good response to the blood transfusion 10/16/2020.  She declined to schedule a follow-up appointment.  As noted above she will call the office next week with a date to begin irinotecan.  Patient seen with Dr. Benay Spice.    Ned Card ANP/GNP-BC   10/22/2020   11:42 AM  This was a shared visit with Ned Card.  Michelle Cain feels better after the red cell transfusion.  She remains undecided on irinotecan.  She will call us next week to schedule a follow-up appointment.  We will be sure germline testing for the [facility] has been performed.  I was present for greater than 50% of today's visit.  I performed medical decision making.  Julieanne Manson, MD

## 2020-10-22 NOTE — Telephone Encounter (Signed)
Per verbal from Ned Card, NP patient will call back next week to schedule appts per 7/8 los

## 2020-10-28 ENCOUNTER — Encounter: Payer: Self-pay | Admitting: Oncology

## 2020-11-05 ENCOUNTER — Telehealth: Payer: Self-pay | Admitting: *Deleted

## 2020-11-05 NOTE — Telephone Encounter (Signed)
Called patient to determine if she has made a decision about continuing treatment. She thinks she may, but is currently in hospital at [facility] for melena and hematemesis w/syncope episode. Hopeful she will be discharged Monday. Prefers to discuss f/u appointment after she leaves the hospital. RN will f/u on her next week.

## 2020-11-12 ENCOUNTER — Other Ambulatory Visit: Payer: Self-pay | Admitting: *Deleted

## 2020-11-17 ENCOUNTER — Other Ambulatory Visit: Payer: Self-pay

## 2020-11-17 ENCOUNTER — Inpatient Hospital Stay: Payer: Medicare Other

## 2020-11-17 ENCOUNTER — Inpatient Hospital Stay: Payer: Medicare Other | Attending: Oncology

## 2020-11-17 VITALS — BP 88/47 | HR 107 | Temp 98.2°F | Resp 18

## 2020-11-17 DIAGNOSIS — Z452 Encounter for adjustment and management of vascular access device: Secondary | ICD-10-CM | POA: Diagnosis not present

## 2020-11-17 DIAGNOSIS — C169 Malignant neoplasm of stomach, unspecified: Secondary | ICD-10-CM

## 2020-11-17 DIAGNOSIS — Z853 Personal history of malignant neoplasm of breast: Secondary | ICD-10-CM | POA: Insufficient documentation

## 2020-11-17 DIAGNOSIS — D649 Anemia, unspecified: Secondary | ICD-10-CM | POA: Insufficient documentation

## 2020-11-17 DIAGNOSIS — K920 Hematemesis: Secondary | ICD-10-CM | POA: Diagnosis not present

## 2020-11-17 DIAGNOSIS — K921 Melena: Secondary | ICD-10-CM | POA: Diagnosis not present

## 2020-11-17 DIAGNOSIS — G629 Polyneuropathy, unspecified: Secondary | ICD-10-CM | POA: Insufficient documentation

## 2020-11-17 LAB — CBC (CANCER CENTER ONLY)
HCT: 26.9 % — ABNORMAL LOW (ref 36.0–46.0)
Hemoglobin: 8.4 g/dL — ABNORMAL LOW (ref 12.0–15.0)
MCH: 31.7 pg (ref 26.0–34.0)
MCHC: 31.2 g/dL (ref 30.0–36.0)
MCV: 101.5 fL — ABNORMAL HIGH (ref 80.0–100.0)
Platelet Count: 123 10*3/uL — ABNORMAL LOW (ref 150–400)
RBC: 2.65 MIL/uL — ABNORMAL LOW (ref 3.87–5.11)
RDW: 22.9 % — ABNORMAL HIGH (ref 11.5–15.5)
WBC Count: 6.1 10*3/uL (ref 4.0–10.5)
nRBC: 4.3 % — ABNORMAL HIGH (ref 0.0–0.2)

## 2020-11-18 ENCOUNTER — Other Ambulatory Visit: Payer: Self-pay

## 2020-11-18 ENCOUNTER — Inpatient Hospital Stay (HOSPITAL_BASED_OUTPATIENT_CLINIC_OR_DEPARTMENT_OTHER): Payer: Medicare Other | Admitting: Nurse Practitioner

## 2020-11-18 ENCOUNTER — Encounter: Payer: Self-pay | Admitting: Nurse Practitioner

## 2020-11-18 ENCOUNTER — Inpatient Hospital Stay: Payer: Medicare Other

## 2020-11-18 VITALS — BP 84/62 | HR 111 | Temp 98.2°F | Resp 18 | Ht 64.0 in | Wt 177.6 lb

## 2020-11-18 DIAGNOSIS — D649 Anemia, unspecified: Secondary | ICD-10-CM | POA: Diagnosis not present

## 2020-11-18 DIAGNOSIS — C169 Malignant neoplasm of stomach, unspecified: Secondary | ICD-10-CM | POA: Diagnosis not present

## 2020-11-18 LAB — PREPARE RBC (CROSSMATCH)

## 2020-11-18 LAB — SAMPLE TO BLOOD BANK

## 2020-11-18 MED ORDER — SODIUM CHLORIDE 0.9% IV SOLUTION
250.0000 mL | Freq: Once | INTRAVENOUS | Status: AC
  Administered 2020-11-18: 250 mL via INTRAVENOUS
  Filled 2020-11-18: qty 250

## 2020-11-18 MED ORDER — SODIUM CHLORIDE 0.9% FLUSH
10.0000 mL | INTRAVENOUS | Status: DC | PRN
  Filled 2020-11-18: qty 10

## 2020-11-18 MED ORDER — HEPARIN SOD (PORK) LOCK FLUSH 100 UNIT/ML IV SOLN
500.0000 [IU] | Freq: Every day | INTRAVENOUS | Status: DC | PRN
  Filled 2020-11-18: qty 5

## 2020-11-18 NOTE — Patient Instructions (Signed)
Blood Transfusion, Adult, Care After This sheet gives you information about how to care for yourself after your procedure. Your doctor may also give you more specific instructions. If youhave problems or questions, contact your doctor. What can I expect after the procedure? After the procedure, it is common to have: Bruising and soreness at the IV site. A fever or chills on the day of the procedure. This may be your body's response to the new blood cells received. A headache. Follow these instructions at home: Insertion site care     Follow instructions from your doctor about how to take care of your insertion site. This is where an IV tube was put into your vein. Make sure you: Wash your hands with soap and water before and after you change your bandage (dressing). If you cannot use soap and water, use hand sanitizer. Change your bandage as told by your doctor. Check your insertion site every day for signs of infection. Check for: Redness, swelling, or pain. Bleeding from the site. Warmth. Pus or a bad smell. General instructions Take over-the-counter and prescription medicines only as told by your doctor. Rest as told by your doctor. Go back to your normal activities as told by your doctor. Keep all follow-up visits as told by your doctor. This is important. Contact a doctor if: You have itching or red, swollen areas of skin (hives). You feel worried or nervous (anxious). You feel weak after doing your normal activities. You have redness, swelling, warmth, or pain around the insertion site. You have blood coming from the insertion site, and the blood does not stop with pressure. You have pus or a bad smell coming from the insertion site. Get help right away if: You have signs of a serious reaction. This may be coming from an allergy or the body's defense system (immune system). Signs include: Trouble breathing or shortness of breath. Swelling of the face or feeling warm  (flushed). Fever or chills. Head, chest, or back pain. Dark pee (urine) or blood in the pee. Widespread rash. Fast heartbeat. Feeling dizzy or light-headed. You may receive your blood transfusion in an outpatient setting. If so, youwill be told whom to contact to report any reactions. These symptoms may be an emergency. Do not wait to see if the symptoms will go away. Get medical help right away. Call your local emergency services (911 in the [place] Do not drive yourself to the hospital. Summary Bruising and soreness at the IV site are common. Check your insertion site every day for signs of infection. Rest as told by your doctor. Go back to your normal activities as told by your doctor. Get help right away if you have signs of a serious reaction. This information is not intended to replace advice given to you by your health care provider. Make sure you discuss any questions you have with your healthcare provider. Document Revised: 09/26/2018 Document Reviewed: 09/26/2018 Elsevier Patient Education  2022 [employer].  

## 2020-11-18 NOTE — Progress Notes (Signed)
[facility] OFFICE PROGRESS NOTE   Diagnosis: Gastric cancer  INTERVAL HISTORY:   [name] returns for follow-up.  She was hospitalized at [facility] 11/01/2020 through 11/14/2020 with hematemesis, melena, syncope.  She was transfused multiple units of blood.  She was seen by Dr. Fanny Skates during the hospitalization, no longer felt to be a candidate for chemotherapy, discharge plan to continue transfusion support twice weekly.  Hemoglobin on 11/15/2020 returned at 7.  She was transfused 2 units of blood at [facility].  CBC at our office yesterday returned with a hemoglobin of 8.4.  She reports continued hematemesis and melena.  She reports generalized abdominal pain.  She denies shortness of breath.  No chest pain.  Current Lovenox dose is 40 mg twice daily.   Objective:  Vital signs in last 24 hours:  Blood pressure (!) 84/62, pulse (!) 111, temperature 98.2 F (36.8 C), temperature source Oral, resp. rate 18, height _0  (1.626 m), weight 177 lb 9.6 oz (80.6 kg), SpO2 100 %.    HEENT: Conjunctival pallor. Resp: Lungs clear bilaterally. Cardio: Regular, tachycardic. GI: Abdomen is soft with generalized tenderness. Vascular: Pitting edema lower leg bilaterally. Neuro: Alert and oriented. Skin: Pale appearing. Port-A-Cath without erythema.   Lab Results:  Lab Results  Component Value Date   WBC 6.1 11/17/2020   HGB 8.4 (L) 11/17/2020   HCT 26.9 (L) 11/17/2020   MCV 101.5 (H) 11/17/2020   PLT 123 (L) 11/17/2020   NEUTROABS 2.9 10/22/2020    Imaging:  No results found.  Medications: I have reviewed the patient's current medications.  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, no loss of mismatch repair  protein expression, HER-2 negative by FISH, GATA3 negative, ER negative CTs revealed wall thickening and luminal narrowing of the colon at the hepatic flexure and cecum PET scan with no evidence of distant metastatic disease or abnormal uptake other than thickening of the distal esophagus and proximal stomach Colonoscopy 10/05/2017 at [facility] revealed multiple foci of thickening/masses at the cecum, hepatic flexure, and distal rectum, biopsy from the cecum and hepatic flexure revealed metastatic adenocarcinoma of gastric origin, 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, PD-L1 combined positive score 2 on hepatic flexure biopsy CTs 10/23/2017-diffuse prominence of the gastric wall, especially the antrum, focal wall thickening of the distal ascending colon and hepatic flexure, thickening of the cecum, nonspecific haziness of the mesenteric fat in the pelvis, mild prominence of lymph nodes at the greater curvature of the stomach Treatment with FOLFOX beginning July 2019 CTs October 2019-stable disease, FOLFOX continued CTs December 2019-stable disease January 2020 treatment transition to Xeloda maintenance, care transition to [place] CTs in June 2020 in September 2020-stable disease, Xeloda continued CTs 06/23/2019-nonspecific thickening of the GE junction, intraluminal bladder mass Cystoscopy-metastatic signet ring cell adenocarcinoma Cycle 1 day 1 ramucirumab/paclitaxel 08/19/2019 (given at another facility) Cycle 1 day 1 ramucirumab/paclitaxel 09/04/2019 Cycle 1 day 8 Taxol 09/10/19 Cycle 1 day 15 ramucirumab/paclitaxel 09/17/2019 canceled d/t neutropenia Ramucirumab/pactlitaxel every 2 weeks 09/24/2019 Ramucirumab/paclitaxel 10/08/2019 Ramucirumab/paclitaxel 10/22/2019 Ramucirumab/paclitaxel 11/05/2019 CTs 11/24/2019-unchanged thickening of the distal esophagus/upper stomach, gastric body and antrum.  Unchanged thickening  of the transverse colon at the hepatic flexure, unchanged thickening of the urinary bladder, no evidence of progressive disease Ramucirumab/paclitaxel 11/25/2019  Ramucirumab/paclitaxel 12/09/2019  Ramucirumab/paclitaxel 12/23/2019 Ramucirumab/paclitaxel 01/06/2020 Ramucirumab/paclitaxel 01/20/2020 Ramucirumab/paclitaxel 02/04/2020 Ramucirumab/paclitaxel 02/17/2020 Ramucirumab/paclitaxel 03/02/2020 Ramucirumab/paclitaxel 03/16/2020 CTs 03/28/2020-stable thickening of the distal esophagus, GE junction, gastric body, and antrum.  Decreased soft tissue thickening of the transverse colon at the hepatic flexure, no evidence of metastatic disease to the chest Ramucirumab/paclitaxel 03/30/2020 SBRT to the stomach 04/12/2020-05/06/2020, 37.5 Gray in 15 fractions Ramucirumab/paclitaxel 04/05/2020 Ramucirumab/paclitaxel 04/13/2020 Ramucirumab/paclitaxel 04/27/2020 Ramucirumab/paclitaxel 05/25/2020 Ramucirumab/paclitaxel 06/11/2020 Ramucirumab/paclitaxel 06/22/2020 Ramucirumab/paclitaxel 07/06/2020 Ramucirumab/paclitaxel 07/20/2020 CT abdomen/pelvis 08/03/2020- wall thickening at the distal esophagus and stomach body/antrum-progressive, indistinct stranding around the descending duodenum, porta hepatis, and retroperitoneum- stable, abnormal wall thickening at the left upper urinary bladder-stable Ramucirumab/paclitaxel 08/11/2020 Upper endoscopy 08/26/2020- gastric mucosa nearly completely replaced with cancer.  Most proximal aspect of the stomach was spared but starting at the proximal gastric body there was extensive, circumferential cancer that continued into the proximal duodenal bulb.  The tumor is ulcerated in some areas, edematous throughout, very friable with spontaneous oozing.  The lumen of the distal stomach was narrowed due to edematous malignant changes but this did not limit scope passage. Guardant 360 09/15/2020-CDH1,PIK3CA Palliative radiation to the stomach at [facility], 4 fractions completed  09/28/2020 Supportive care with transfusion support   2.  Left breast cancer 2008,pT1c,pN0, status post a left lumpectomy with adjuvant chemotherapy and radiation, ER positive, PR positive, HER-2 positive   3.  Mixed connective tissue disease/SLE   4.  Lower extremity deep vein thrombosis maintained on apixaban-placed on hold due to hematemesis, hematuria 08/19/2020.  See findings of upper endoscopy from 08/26/2020.  Dr. Ardis Hughs recommends against ever resuming a blood thinner.   5.  Family history of multiple cancers including breast and ovarian cancer   6.  Dysphagia and intermittent vomiting secondary to #1   7.  Hypertension   8.  Peripheral neuropathy   9.  Masslike fullness at the posterior right parotid/angle of the jaw   10. Dyspnea on exertion, ongoing for about a month, worsened after taxol/ramucirumab on 09/04/19 requiring ED visit on 5/23. CBC, CMP, troponin, BNP and chest xray negative    11.  Anemia likely secondary to combination of chemotherapy and blood loss 2 units packed red blood cells 04/12/2020, 07/20/2020 12.  Admission 05/11/2020 with pneumonia, CT chest revealed left upper and lower lobe opacities 13.  Progressive abdominal pain beginning 07/29/2020- etiology unclear, likely related to progression of tumor in the stomach 14.  Massive PE with cor pulmonale May 2022 15.  Hospital admission 09/08/2020 massive syncope, PE, symptomatic anemia. Bilateral pulmonary angiogram and mechanical thrombectomy 09/09/2020 IVC filter placement 09/12/2020 Lovenox adjusted to 50 mg every 12 hours while admitted at [facility] June 2022  16.  Hospitalized at [facility] 11/02/2020 through 11/14/2020 with hematemesis, melena, syncope.  Transfused multiple units of blood.  No longer felt to be a candidate for chemotherapy.  Discharge plan for continued transfusion support twice weekly.    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  She was recently hospitalized at [facility] with hematemesis,  melena.  No longer felt to be a candidate for chemotherapy.  Overall plan is supportive care with transfusion support.  She will receive 2 units of blood today.  We are making arrangements for red cell transfusion support as needed on a Monday Thursday schedule.  We will see her in follow-up in 1 week.  Patient seen with Dr. Benay Spice.    Ned Card ANP/GNP-BC   11/18/2020  9:02 AM This was a shared visit with Ned Card.  [name] was interviewed  and examined.  I have communicated with Dr. Fanny Skates over the past week regarding her case.  She continues to have severe anemia secondary to bleeding from the gastric mass.  We will continue transfusion support for now.  She does not wish to enroll in hospice care.  There is no plan for salvage systemic chemotherapy.  We will try to arrange for twice weekly as needed Red cell transfusions here.  I was present for greater than 50% of today's visit.  I performed medical decision making.  Julieanne Manson, MD

## 2020-11-19 ENCOUNTER — Other Ambulatory Visit: Payer: Self-pay

## 2020-11-19 ENCOUNTER — Other Ambulatory Visit: Payer: Self-pay | Admitting: *Deleted

## 2020-11-19 ENCOUNTER — Inpatient Hospital Stay: Payer: Medicare Other

## 2020-11-19 VITALS — BP 99/69 | HR 108 | Temp 98.3°F | Resp 18

## 2020-11-19 DIAGNOSIS — D649 Anemia, unspecified: Secondary | ICD-10-CM

## 2020-11-19 DIAGNOSIS — C169 Malignant neoplasm of stomach, unspecified: Secondary | ICD-10-CM

## 2020-11-19 DIAGNOSIS — Z95828 Presence of other vascular implants and grafts: Secondary | ICD-10-CM

## 2020-11-19 LAB — BPAM RBC
Blood Product Expiration Date: 202208292359
Blood Product Expiration Date: 202208292359
ISSUE DATE / TIME: 202208041026
ISSUE DATE / TIME: 202208041026
Unit Type and Rh: 6200
Unit Type and Rh: 6200

## 2020-11-19 LAB — TYPE AND SCREEN
ABO/RH(D): A POS
Antibody Screen: NEGATIVE
Unit division: 0
Unit division: 0

## 2020-11-19 LAB — SAMPLE TO BLOOD BANK

## 2020-11-19 LAB — PREPARE RBC (CROSSMATCH)

## 2020-11-19 MED ORDER — SODIUM CHLORIDE 0.9% FLUSH
10.0000 mL | Freq: Once | INTRAVENOUS | Status: AC
  Administered 2020-11-19: 10 mL
  Filled 2020-11-19: qty 10

## 2020-11-19 MED ORDER — HEPARIN SOD (PORK) LOCK FLUSH 100 UNIT/ML IV SOLN
500.0000 [IU] | Freq: Once | INTRAVENOUS | Status: AC
  Administered 2020-11-19: 500 [IU]
  Filled 2020-11-19: qty 5

## 2020-11-19 NOTE — Patient Instructions (Signed)
Implanted Port Home Guide An implanted port is a device that is placed under the skin. It is usually placed in the chest. The device can be used to give IV medicine, to take blood, or for dialysis. You may have an implanted port if: You need IV medicine that would be irritating to the small veins in your hands or arms. You need IV medicines, such as antibiotics, for a long period of time. You need IV nutrition for a long period of time. You need dialysis. When you have a port, your health care provider can choose to use the port instead of veins in your arms for these procedures. You may have fewer limitations when using a port than you would if you used other types of long-term IVs, and you will likely be able to return to normal activities afteryour incision heals. An implanted port has two main parts: Reservoir. The reservoir is the part where a needle is inserted to give medicines or draw blood. The reservoir is round. After it is placed, it appears as a small, raised area under your skin. Catheter. The catheter is a thin, flexible tube that connects the reservoir to a vein. Medicine that is inserted into the reservoir goes into the catheter and then into the vein. How is my port accessed? To access your port: A numbing cream may be placed on the skin over the port site. Your health care provider will put on a mask and sterile gloves. The skin over your port will be cleaned carefully with a germ-killing soap and allowed to dry. Your health care provider will gently pinch the port and insert a needle into it. Your health care provider will check for a blood return to make sure the port is in the vein and is not clogged. If your port needs to remain accessed to get medicine continuously (constant infusion), your health care provider will place a clear bandage (dressing) over the needle site. The dressing and needle will need to be changed every week, or as told by your health care provider. What  is flushing? Flushing helps keep the port from getting clogged. Follow instructions from your health care provider about how and when to flush the port. Ports are usually flushed with saline solution or a medicine called heparin. The need for flushing will depend on how the port is used: If the port is only used from time to time to give medicines or draw blood, the port may need to be flushed: Before and after medicines have been given. Before and after blood has been drawn. As part of routine maintenance. Flushing may be recommended every 4-6 weeks. If a constant infusion is running, the port may not need to be flushed. Throw away any syringes in a disposal container that is meant for sharp items (sharps container). You can buy a sharps container from a pharmacy, or you can make one by using an empty hard plastic bottle with a cover. How long will my port stay implanted? The port can stay in for as long as your health care provider thinks it is needed. When it is time for the port to come out, a surgery will be done to remove it. The surgery will be similar to the procedure that was done to putthe port in. Follow these instructions at home:  Flush your port as told by your health care provider. If you need an infusion over several days, follow instructions from your health care provider about how to take   care of your port site. Make sure you: Wash your hands with soap and water before you change your dressing. If soap and water are not available, use alcohol-based hand sanitizer. Change your dressing as told by your health care provider. Place any used dressings or infusion bags into a plastic bag. Throw that bag in the trash. Keep the dressing that covers the needle clean and dry. Do not get it wet. Do not use scissors or sharp objects near the tube. Keep the tube clamped, unless it is being used. Check your port site every day for signs of infection. Check for: Redness, swelling, or  pain. Fluid or blood. Pus or a bad smell. Protect the skin around the port site. Avoid wearing bra straps that rub or irritate the site. Protect the skin around your port from seat belts. Place a soft pad over your chest if needed. Bathe or shower as told by your health care provider. The site may get wet as long as you are not actively receiving an infusion. Return to your normal activities as told by your health care provider. Ask your health care provider what activities are safe for you. Carry a medical alert card or wear a medical alert bracelet at all times. This will let health care providers know that you have an implanted port in case of an emergency. Get help right away if: You have redness, swelling, or pain at the port site. You have fluid or blood coming from your port site. You have pus or a bad smell coming from the port site. You have a fever. Summary Implanted ports are usually placed in the chest for long-term IV access. Follow instructions from your health care provider about flushing the port and changing bandages (dressings). Take care of the area around your port by avoiding clothing that puts pressure on the area, and by watching for signs of infection. Protect the skin around your port from seat belts. Place a soft pad over your chest if needed. Get help right away if you have a fever or you have redness, swelling, pain, drainage, or a bad smell at the port site. This information is not intended to replace advice given to you by your health care provider. Make sure you discuss any questions you have with your healthcare provider. Document Revised: 08/18/2019 Document Reviewed: 08/18/2019 Elsevier Patient Education  2022 Elsevier Inc.  

## 2020-11-19 NOTE — Progress Notes (Signed)
Faxed blood bank ticket to [facility] and called [facility] to arrange for pick up at [facility] and delivery to [facility] by 0900 on 8/8

## 2020-11-19 NOTE — Progress Notes (Signed)
Error

## 2020-11-22 ENCOUNTER — Inpatient Hospital Stay: Payer: Medicare Other

## 2020-11-22 ENCOUNTER — Other Ambulatory Visit: Payer: Self-pay

## 2020-11-22 DIAGNOSIS — D649 Anemia, unspecified: Secondary | ICD-10-CM

## 2020-11-22 DIAGNOSIS — C169 Malignant neoplasm of stomach, unspecified: Secondary | ICD-10-CM

## 2020-11-22 LAB — CBC WITH DIFFERENTIAL (CANCER CENTER ONLY)
Abs Immature Granulocytes: 0.05 10*3/uL (ref 0.00–0.07)
Basophils Absolute: 0 10*3/uL (ref 0.0–0.1)
Basophils Relative: 0 %
Eosinophils Absolute: 0 10*3/uL (ref 0.0–0.5)
Eosinophils Relative: 0 %
HCT: 30.1 % — ABNORMAL LOW (ref 36.0–46.0)
Hemoglobin: 9.8 g/dL — ABNORMAL LOW (ref 12.0–15.0)
Immature Granulocytes: 1 %
Lymphocytes Relative: 10 %
Lymphs Abs: 0.5 10*3/uL — ABNORMAL LOW (ref 0.7–4.0)
MCH: 33.1 pg (ref 26.0–34.0)
MCHC: 32.6 g/dL (ref 30.0–36.0)
MCV: 101.7 fL — ABNORMAL HIGH (ref 80.0–100.0)
Monocytes Absolute: 0.5 10*3/uL (ref 0.1–1.0)
Monocytes Relative: 9 %
Neutro Abs: 4.2 10*3/uL (ref 1.7–7.7)
Neutrophils Relative %: 80 %
Platelet Count: 144 10*3/uL — ABNORMAL LOW (ref 150–400)
RBC: 2.96 MIL/uL — ABNORMAL LOW (ref 3.87–5.11)
RDW: 21.8 % — ABNORMAL HIGH (ref 11.5–15.5)
WBC Count: 5.3 10*3/uL (ref 4.0–10.5)
nRBC: 0 % (ref 0.0–0.2)

## 2020-11-22 LAB — SAMPLE TO BLOOD BANK

## 2020-11-22 MED ORDER — HEPARIN SOD (PORK) LOCK FLUSH 100 UNIT/ML IV SOLN
500.0000 [IU] | Freq: Every day | INTRAVENOUS | Status: AC | PRN
  Administered 2020-11-22: 500 [IU]
  Filled 2020-11-22: qty 5

## 2020-11-22 MED ORDER — SODIUM CHLORIDE 0.9% IV SOLUTION
250.0000 mL | Freq: Once | INTRAVENOUS | Status: AC
  Administered 2020-11-22: 250 mL via INTRAVENOUS
  Filled 2020-11-22: qty 250

## 2020-11-22 MED ORDER — SODIUM CHLORIDE 0.9% FLUSH
10.0000 mL | INTRAVENOUS | Status: AC | PRN
  Administered 2020-11-22: 10 mL
  Filled 2020-11-22: qty 10

## 2020-11-22 NOTE — Patient Instructions (Signed)
Implanted Port Home Guide An implanted port is a device that is placed under the skin. It is usually placed in the chest. The device can be used to give IV medicine, to take blood, or for dialysis. You may have an implanted port if: You need IV medicine that would be irritating to the small veins in your hands or arms. You need IV medicines, such as antibiotics, for a long period of time. You need IV nutrition for a long period of time. You need dialysis. When you have a port, your health care provider can choose to use the port instead of veins in your arms for these procedures. You may have fewer limitations when using a port than you would if you used other types of long-term IVs, and you will likely be able to return to normal activities afteryour incision heals. An implanted port has two main parts: Reservoir. The reservoir is the part where a needle is inserted to give medicines or draw blood. The reservoir is round. After it is placed, it appears as a small, raised area under your skin. Catheter. The catheter is a thin, flexible tube that connects the reservoir to a vein. Medicine that is inserted into the reservoir goes into the catheter and then into the vein. How is my port accessed? To access your port: A numbing cream may be placed on the skin over the port site. Your health care provider will put on a mask and sterile gloves. The skin over your port will be cleaned carefully with a germ-killing soap and allowed to dry. Your health care provider will gently pinch the port and insert a needle into it. Your health care provider will check for a blood return to make sure the port is in the vein and is not clogged. If your port needs to remain accessed to get medicine continuously (constant infusion), your health care provider will place a clear bandage (dressing) over the needle site. The dressing and needle will need to be changed every week, or as told by your health care provider. What  is flushing? Flushing helps keep the port from getting clogged. Follow instructions from your health care provider about how and when to flush the port. Ports are usually flushed with saline solution or a medicine called heparin. The need for flushing will depend on how the port is used: If the port is only used from time to time to give medicines or draw blood, the port may need to be flushed: Before and after medicines have been given. Before and after blood has been drawn. As part of routine maintenance. Flushing may be recommended every 4-6 weeks. If a constant infusion is running, the port may not need to be flushed. Throw away any syringes in a disposal container that is meant for sharp items (sharps container). You can buy a sharps container from a pharmacy, or you can make one by using an empty hard plastic bottle with a cover. How long will my port stay implanted? The port can stay in for as long as your health care provider thinks it is needed. When it is time for the port to come out, a surgery will be done to remove it. The surgery will be similar to the procedure that was done to putthe port in. Follow these instructions at home:  Flush your port as told by your health care provider. If you need an infusion over several days, follow instructions from your health care provider about how to take   care of your port site. Make sure you: Wash your hands with soap and water before you change your dressing. If soap and water are not available, use alcohol-based hand sanitizer. Change your dressing as told by your health care provider. Place any used dressings or infusion bags into a plastic bag. Throw that bag in the trash. Keep the dressing that covers the needle clean and dry. Do not get it wet. Do not use scissors or sharp objects near the tube. Keep the tube clamped, unless it is being used. Check your port site every day for signs of infection. Check for: Redness, swelling, or  pain. Fluid or blood. Pus or a bad smell. Protect the skin around the port site. Avoid wearing bra straps that rub or irritate the site. Protect the skin around your port from seat belts. Place a soft pad over your chest if needed. Bathe or shower as told by your health care provider. The site may get wet as long as you are not actively receiving an infusion. Return to your normal activities as told by your health care provider. Ask your health care provider what activities are safe for you. Carry a medical alert card or wear a medical alert bracelet at all times. This will let health care providers know that you have an implanted port in case of an emergency. Get help right away if: You have redness, swelling, or pain at the port site. You have fluid or blood coming from your port site. You have pus or a bad smell coming from the port site. You have a fever. Summary Implanted ports are usually placed in the chest for long-term IV access. Follow instructions from your health care provider about flushing the port and changing bandages (dressings). Take care of the area around your port by avoiding clothing that puts pressure on the area, and by watching for signs of infection. Protect the skin around your port from seat belts. Place a soft pad over your chest if needed. Get help right away if you have a fever or you have redness, swelling, pain, drainage, or a bad smell at the port site. This information is not intended to replace advice given to you by your health care provider. Make sure you discuss any questions you have with your healthcare provider. Document Revised: 08/18/2019 Document Reviewed: 08/18/2019 Elsevier Patient Education  2022 Elsevier Inc.  

## 2020-11-22 NOTE — Patient Instructions (Signed)
Blood Transfusion, Adult, Care After This sheet gives you information about how to care for yourself after your procedure. Your doctor may also give you more specific instructions. If youhave problems or questions, contact your doctor. What can I expect after the procedure? After the procedure, it is common to have: Bruising and soreness at the IV site. A fever or chills on the day of the procedure. This may be your body's response to the new blood cells received. A headache. Follow these instructions at home: Insertion site care     Follow instructions from your doctor about how to take care of your insertion site. This is where an IV tube was put into your vein. Make sure you: Wash your hands with soap and water before and after you change your bandage (dressing). If you cannot use soap and water, use hand sanitizer. Change your bandage as told by your doctor. Check your insertion site every day for signs of infection. Check for: Redness, swelling, or pain. Bleeding from the site. Warmth. Pus or a bad smell. General instructions Take over-the-counter and prescription medicines only as told by your doctor. Rest as told by your doctor. Go back to your normal activities as told by your doctor. Keep all follow-up visits as told by your doctor. This is important. Contact a doctor if: You have itching or red, swollen areas of skin (hives). You feel worried or nervous (anxious). You feel weak after doing your normal activities. You have redness, swelling, warmth, or pain around the insertion site. You have blood coming from the insertion site, and the blood does not stop with pressure. You have pus or a bad smell coming from the insertion site. Get help right away if: You have signs of a serious reaction. This may be coming from an allergy or the body's defense system (immune system). Signs include: Trouble breathing or shortness of breath. Swelling of the face or feeling warm  (flushed). Fever or chills. Head, chest, or back pain. Dark pee (urine) or blood in the pee. Widespread rash. Fast heartbeat. Feeling dizzy or light-headed. You may receive your blood transfusion in an outpatient setting. If so, youwill be told whom to contact to report any reactions. These symptoms may be an emergency. Do not wait to see if the symptoms will go away. Get medical help right away. Call your local emergency services (911 in the [place] Do not drive yourself to the hospital. Summary Bruising and soreness at the IV site are common. Check your insertion site every day for signs of infection. Rest as told by your doctor. Go back to your normal activities as told by your doctor. Get help right away if you have signs of a serious reaction. This information is not intended to replace advice given to you by your health care provider. Make sure you discuss any questions you have with your healthcare provider. Document Revised: 09/26/2018 Document Reviewed: 09/26/2018 Elsevier Patient Education  2022 [employer].  

## 2020-11-23 ENCOUNTER — Other Ambulatory Visit: Payer: Self-pay | Admitting: *Deleted

## 2020-11-23 DIAGNOSIS — D649 Anemia, unspecified: Secondary | ICD-10-CM

## 2020-11-23 DIAGNOSIS — C169 Malignant neoplasm of stomach, unspecified: Secondary | ICD-10-CM

## 2020-11-23 LAB — TYPE AND SCREEN
ABO/RH(D): A POS
Antibody Screen: NEGATIVE
Unit division: 0
Unit division: 0

## 2020-11-23 LAB — BPAM RBC
Blood Product Expiration Date: 202208302359
Blood Product Expiration Date: 202208302359
ISSUE DATE / TIME: 202208080814
ISSUE DATE / TIME: 202208080814
Unit Type and Rh: 6200
Unit Type and Rh: 6200

## 2020-11-24 ENCOUNTER — Other Ambulatory Visit: Payer: Self-pay | Admitting: *Deleted

## 2020-11-24 DIAGNOSIS — D649 Anemia, unspecified: Secondary | ICD-10-CM

## 2020-11-24 DIAGNOSIS — C169 Malignant neoplasm of stomach, unspecified: Secondary | ICD-10-CM

## 2020-11-24 LAB — PREPARE RBC (CROSSMATCH)

## 2020-11-25 ENCOUNTER — Inpatient Hospital Stay: Payer: Medicare Other

## 2020-11-25 ENCOUNTER — Other Ambulatory Visit: Payer: Self-pay

## 2020-11-25 ENCOUNTER — Inpatient Hospital Stay (HOSPITAL_BASED_OUTPATIENT_CLINIC_OR_DEPARTMENT_OTHER): Payer: Medicare Other | Admitting: Oncology

## 2020-11-25 VITALS — BP 100/72 | HR 110 | Temp 97.8°F | Resp 18 | Ht 64.0 in | Wt 177.4 lb

## 2020-11-25 DIAGNOSIS — D649 Anemia, unspecified: Secondary | ICD-10-CM

## 2020-11-25 DIAGNOSIS — C169 Malignant neoplasm of stomach, unspecified: Secondary | ICD-10-CM

## 2020-11-25 LAB — CBC WITH DIFFERENTIAL (CANCER CENTER ONLY)
Abs Immature Granulocytes: 0.02 10*3/uL (ref 0.00–0.07)
Basophils Absolute: 0 10*3/uL (ref 0.0–0.1)
Basophils Relative: 0 %
Eosinophils Absolute: 0 10*3/uL (ref 0.0–0.5)
Eosinophils Relative: 1 %
HCT: 34.1 % — ABNORMAL LOW (ref 36.0–46.0)
Hemoglobin: 11 g/dL — ABNORMAL LOW (ref 12.0–15.0)
Immature Granulocytes: 0 %
Lymphocytes Relative: 13 %
Lymphs Abs: 0.6 10*3/uL — ABNORMAL LOW (ref 0.7–4.0)
MCH: 32.1 pg (ref 26.0–34.0)
MCHC: 32.3 g/dL (ref 30.0–36.0)
MCV: 99.4 fL (ref 80.0–100.0)
Monocytes Absolute: 0.4 10*3/uL (ref 0.1–1.0)
Monocytes Relative: 8 %
Neutro Abs: 3.7 10*3/uL (ref 1.7–7.7)
Neutrophils Relative %: 78 %
Platelet Count: 163 10*3/uL (ref 150–400)
RBC: 3.43 MIL/uL — ABNORMAL LOW (ref 3.87–5.11)
RDW: 19.5 % — ABNORMAL HIGH (ref 11.5–15.5)
WBC Count: 4.8 10*3/uL (ref 4.0–10.5)
nRBC: 0 % (ref 0.0–0.2)

## 2020-11-25 MED ORDER — HEPARIN SOD (PORK) LOCK FLUSH 100 UNIT/ML IV SOLN
500.0000 [IU] | Freq: Once | INTRAVENOUS | Status: AC
  Administered 2020-11-25: 500 [IU] via INTRAVENOUS
  Filled 2020-11-25: qty 5

## 2020-11-25 MED ORDER — SODIUM CHLORIDE 0.9% FLUSH
10.0000 mL | INTRAVENOUS | Status: DC | PRN
  Administered 2020-11-25: 10 mL via INTRAVENOUS
  Filled 2020-11-25: qty 10

## 2020-11-25 NOTE — Progress Notes (Signed)
[facility] [place] OFFICE PROGRESS NOTE   Diagnosis: Gastric cancer, anemia  INTERVAL HISTORY:   Michelle Cain returns as scheduled.  She is here today with her mother.  She reports feeling better after receiving red cells earlier this week.  She continues to have dark stool, no gross bleeding.  No hematuria.  She vomits intermittently, but is eating.  Objective:  Vital signs in last 24 hours:  Blood pressure 100/72, pulse (!) 110, temperature 97.8 F (36.6 C), temperature source Oral, resp. rate 18, height _0  (1.626 m), weight 177 lb 6.4 oz (80.5 kg), SpO2 100 %.    Resp: Lungs with decreased breath sounds at the right posterior chest, no respiratory distress Cardio: Regular rate and rhythm GI: Tender in the mid and upper abdomen Vascular: Trace pitting edema at the feet bilaterally    Portacath/PICC-without erythema  Lab Results:  Lab Results  Component Value Date   WBC 4.8 11/25/2020   HGB 11.0 (L) 11/25/2020   HCT 34.1 (L) 11/25/2020   MCV 99.4 11/25/2020   PLT 163 11/25/2020   NEUTROABS 3.7 11/25/2020    CMP  Lab Results  Component Value Date   NA 136 10/22/2020   K 3.9 10/22/2020   CL 102 10/22/2020   CO2 26 10/22/2020   GLUCOSE 103 (H) 10/22/2020   BUN 8 10/22/2020   CREATININE 0.43 (L) 10/22/2020   CALCIUM 8.6 (L) 10/22/2020   PROT 5.9 (L) 10/22/2020   ALBUMIN 2.9 (L) 10/22/2020   AST 39 10/22/2020   ALT 24 10/22/2020   ALKPHOS 119 10/22/2020   BILITOT 0.5 10/22/2020   GFRNONAA >60 10/22/2020   GFRAA >60 01/06/2020    Lab Results  Component Value Date   CEA1 3.45 09/04/2019    Medications: I have reviewed the patient's current medications.   Assessment/Plan:  Gastric cancer Presenting with dysphagia spring 2019 Upper endoscopy 08/17/2017 revealed gastric and esophageal erosion, biopsies from the distal esophagus, gastric erosion, and random stomach biopsies confirmed poorly differentiated invasive adenocarcinoma with signet  ring cell morphology, no loss of mismatch repair protein expression, HER-2 negative by FISH, GATA3 negative, ER negative CTs revealed wall thickening and luminal narrowing of the colon at the hepatic flexure and cecum PET scan with no evidence of distant metastatic disease or abnormal uptake other than thickening of the distal esophagus and proximal stomach Colonoscopy 10/05/2017 at [facility] revealed multiple foci of thickening/masses at the cecum, hepatic flexure, and distal rectum, biopsy from the cecum and hepatic flexure revealed metastatic adenocarcinoma of gastric origin, 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, PD-L1 combined positive score 2 on hepatic flexure biopsy CTs 10/23/2017-diffuse prominence of the gastric wall, especially the antrum, focal wall thickening of the distal ascending colon and hepatic flexure, thickening of the cecum, nonspecific haziness of the mesenteric fat in the pelvis, mild prominence of lymph nodes at the greater curvature of the stomach Treatment with FOLFOX beginning July 2019 CTs October 2019-stable disease, FOLFOX continued CTs December 2019-stable disease January 2020 treatment transition to Xeloda maintenance, care transition to [place] CTs in June 2020 in September 2020-stable disease, Xeloda continued CTs 06/23/2019-nonspecific thickening of the GE junction, intraluminal bladder mass Cystoscopy-metastatic signet ring cell adenocarcinoma Cycle 1 day 1 ramucirumab/paclitaxel 08/19/2019 (given at another facility) Cycle 1 day 1 ramucirumab/paclitaxel 09/04/2019 Cycle 1 day 8 Taxol 09/10/19 Cycle 1 day 15 ramucirumab/paclitaxel 09/17/2019 canceled d/t neutropenia Ramucirumab/pactlitaxel every 2 weeks 09/24/2019 Ramucirumab/paclitaxel 10/08/2019 Ramucirumab/paclitaxel 10/22/2019  Ramucirumab/paclitaxel 11/05/2019 CTs 11/24/2019-unchanged thickening of the distal esophagus/upper  stomach, gastric body and antrum.  Unchanged thickening of the transverse colon at the hepatic flexure, unchanged thickening of the urinary bladder, no evidence of progressive disease Ramucirumab/paclitaxel 11/25/2019 Ramucirumab/paclitaxel 12/09/2019  Ramucirumab/paclitaxel 12/23/2019 Ramucirumab/paclitaxel 01/06/2020 Ramucirumab/paclitaxel 01/20/2020 Ramucirumab/paclitaxel 02/04/2020 Ramucirumab/paclitaxel 02/17/2020 Ramucirumab/paclitaxel 03/02/2020 Ramucirumab/paclitaxel 03/16/2020 CTs 03/28/2020-stable thickening of the distal esophagus, GE junction, gastric body, and antrum.  Decreased soft tissue thickening of the transverse colon at the hepatic flexure, no evidence of metastatic disease to the chest Ramucirumab/paclitaxel 03/30/2020 SBRT to the stomach 04/12/2020-05/06/2020, 37.5 Gray in 15 fractions Ramucirumab/paclitaxel 04/05/2020 Ramucirumab/paclitaxel 04/13/2020 Ramucirumab/paclitaxel 04/27/2020 Ramucirumab/paclitaxel 05/25/2020 Ramucirumab/paclitaxel 06/11/2020 Ramucirumab/paclitaxel 06/22/2020 Ramucirumab/paclitaxel 07/06/2020 Ramucirumab/paclitaxel 07/20/2020 CT abdomen/pelvis 08/03/2020- wall thickening at the distal esophagus and stomach body/antrum-progressive, indistinct stranding around the descending duodenum, porta hepatis, and retroperitoneum- stable, abnormal wall thickening at the left upper urinary bladder-stable Ramucirumab/paclitaxel 08/11/2020 Upper endoscopy 08/26/2020- gastric mucosa nearly completely replaced with cancer.  Most proximal aspect of the stomach was spared but starting at the proximal gastric body there was extensive, circumferential cancer that continued into the proximal duodenal bulb.  The tumor is ulcerated in some areas, edematous throughout, very friable with spontaneous oozing.  The lumen of the distal stomach was narrowed due to edematous malignant changes but this did not limit scope passage. Guardant 360 09/15/2020-CDH1,PIK3CA Palliative radiation to the  stomach at [facility], 4 fractions completed 09/28/2020 Supportive care with transfusion support   2.  Left breast cancer 2008,pT1c,pN0, status post a left lumpectomy with adjuvant chemotherapy and radiation, ER positive, PR positive, HER-2 positive   3.  Mixed connective tissue disease/SLE   4.  Lower extremity deep vein thrombosis maintained on apixaban-placed on hold due to hematemesis, hematuria 08/19/2020.  See findings of upper endoscopy from 08/26/2020.  Dr. Ardis Hughs recommends against ever resuming a blood thinner.   5.  Family history of multiple cancers including breast and ovarian cancer   6.  Dysphagia and intermittent vomiting secondary to #1   7.  Hypertension   8.  Peripheral neuropathy   9.  Masslike fullness at the posterior right parotid/angle of the jaw   10. Dyspnea on exertion, ongoing for about a month, worsened after taxol/ramucirumab on 09/04/19 requiring ED visit on 5/23. CBC, CMP, troponin, BNP and chest xray negative    11.  Anemia likely secondary to combination of chemotherapy and blood loss 2 units packed red blood cells 04/12/2020, 07/20/2020 12.  Admission 05/11/2020 with pneumonia, CT chest revealed left upper and lower lobe opacities 13.  Progressive abdominal pain beginning 07/29/2020- etiology unclear, likely related to progression of tumor in the stomach 14.  Massive PE with cor pulmonale May 2022 15.  Hospital admission 09/08/2020 massive syncope, PE, symptomatic anemia. Bilateral pulmonary angiogram and mechanical thrombectomy 09/09/2020 IVC filter placement 09/12/2020 Lovenox adjusted to 50 mg every 12 hours while admitted at [facility] June 2022  16.  Hospitalized at [facility] 11/02/2020 through 11/14/2020 with hematemesis, melena, syncope.  Transfused multiple units of blood.  No longer felt to be a candidate for chemotherapy.  Discharge plan for continued transfusion support twice weekly.      Disposition: Michelle Cain has an improved performance status following Red  cell transfusions given earlier this week.  The hemoglobin is higher.  She will not receive a red cell transfusion today.  She will continue twice weekly follow-up at the Cancer center with transfusion support as needed. She will continue Lovenox.  She will continue the current antiemetic and pain regimen.  She will be scheduled for an office visit on 12/06/2020.   Betsy Coder, MD  11/25/2020  8:19 AM

## 2020-11-25 NOTE — Patient Instructions (Signed)
Implanted Port Home Guide An implanted port is a device that is placed under the skin. It is usually placed in the chest. The device can be used to give IV medicine, to take blood, or for dialysis. You may have an implanted port if: You need IV medicine that would be irritating to the small veins in your hands or arms. You need IV medicines, such as antibiotics, for a long period of time. You need IV nutrition for a long period of time. You need dialysis. When you have a port, your health care provider can choose to use the port instead of veins in your arms for these procedures. You may have fewer limitations when using a port than you would if you used other types of long-term IVs, and you will likely be able to return to normal activities afteryour incision heals. An implanted port has two main parts: Reservoir. The reservoir is the part where a needle is inserted to give medicines or draw blood. The reservoir is round. After it is placed, it appears as a small, raised area under your skin. Catheter. The catheter is a thin, flexible tube that connects the reservoir to a vein. Medicine that is inserted into the reservoir goes into the catheter and then into the vein. How is my port accessed? To access your port: A numbing cream may be placed on the skin over the port site. Your health care provider will put on a mask and sterile gloves. The skin over your port will be cleaned carefully with a germ-killing soap and allowed to dry. Your health care provider will gently pinch the port and insert a needle into it. Your health care provider will check for a blood return to make sure the port is in the vein and is not clogged. If your port needs to remain accessed to get medicine continuously (constant infusion), your health care provider will place a clear bandage (dressing) over the needle site. The dressing and needle will need to be changed every week, or as told by your health care provider. What  is flushing? Flushing helps keep the port from getting clogged. Follow instructions from your health care provider about how and when to flush the port. Ports are usually flushed with saline solution or a medicine called heparin. The need for flushing will depend on how the port is used: If the port is only used from time to time to give medicines or draw blood, the port may need to be flushed: Before and after medicines have been given. Before and after blood has been drawn. As part of routine maintenance. Flushing may be recommended every 4-6 weeks. If a constant infusion is running, the port may not need to be flushed. Throw away any syringes in a disposal container that is meant for sharp items (sharps container). You can buy a sharps container from a pharmacy, or you can make one by using an empty hard plastic bottle with a cover. How long will my port stay implanted? The port can stay in for as long as your health care provider thinks it is needed. When it is time for the port to come out, a surgery will be done to remove it. The surgery will be similar to the procedure that was done to putthe port in. Follow these instructions at home:  Flush your port as told by your health care provider. If you need an infusion over several days, follow instructions from your health care provider about how to take   care of your port site. Make sure you: Wash your hands with soap and water before you change your dressing. If soap and water are not available, use alcohol-based hand sanitizer. Change your dressing as told by your health care provider. Place any used dressings or infusion bags into a plastic bag. Throw that bag in the trash. Keep the dressing that covers the needle clean and dry. Do not get it wet. Do not use scissors or sharp objects near the tube. Keep the tube clamped, unless it is being used. Check your port site every day for signs of infection. Check for: Redness, swelling, or  pain. Fluid or blood. Pus or a bad smell. Protect the skin around the port site. Avoid wearing bra straps that rub or irritate the site. Protect the skin around your port from seat belts. Place a soft pad over your chest if needed. Bathe or shower as told by your health care provider. The site may get wet as long as you are not actively receiving an infusion. Return to your normal activities as told by your health care provider. Ask your health care provider what activities are safe for you. Carry a medical alert card or wear a medical alert bracelet at all times. This will let health care providers know that you have an implanted port in case of an emergency. Get help right away if: You have redness, swelling, or pain at the port site. You have fluid or blood coming from your port site. You have pus or a bad smell coming from the port site. You have a fever. Summary Implanted ports are usually placed in the chest for long-term IV access. Follow instructions from your health care provider about flushing the port and changing bandages (dressings). Take care of the area around your port by avoiding clothing that puts pressure on the area, and by watching for signs of infection. Protect the skin around your port from seat belts. Place a soft pad over your chest if needed. Get help right away if you have a fever or you have redness, swelling, pain, drainage, or a bad smell at the port site. This information is not intended to replace advice given to you by your health care provider. Make sure you discuss any questions you have with your healthcare provider. Document Revised: 08/18/2019 Document Reviewed: 08/18/2019 Elsevier Patient Education  2022 Elsevier Inc.  

## 2020-11-26 ENCOUNTER — Other Ambulatory Visit: Payer: Medicare Other

## 2020-11-26 ENCOUNTER — Inpatient Hospital Stay: Payer: Medicare Other

## 2020-11-26 LAB — BPAM RBC
Blood Product Expiration Date: 202209052359
ISSUE DATE / TIME: 202208110750
Unit Type and Rh: 6200

## 2020-11-26 LAB — TYPE AND SCREEN
ABO/RH(D): A POS
Antibody Screen: NEGATIVE
Unit division: 0
Unit division: 0

## 2020-11-29 ENCOUNTER — Inpatient Hospital Stay: Payer: Medicare Other

## 2020-11-29 ENCOUNTER — Other Ambulatory Visit: Payer: Medicare Other

## 2020-11-29 ENCOUNTER — Other Ambulatory Visit: Payer: Self-pay

## 2020-11-29 VITALS — BP 98/68 | HR 112 | Temp 99.0°F | Resp 18

## 2020-11-29 DIAGNOSIS — C169 Malignant neoplasm of stomach, unspecified: Secondary | ICD-10-CM

## 2020-11-29 DIAGNOSIS — Z95828 Presence of other vascular implants and grafts: Secondary | ICD-10-CM

## 2020-11-29 LAB — CBC WITH DIFFERENTIAL (CANCER CENTER ONLY)
Abs Immature Granulocytes: 0.03 10*3/uL (ref 0.00–0.07)
Basophils Absolute: 0 10*3/uL (ref 0.0–0.1)
Basophils Relative: 0 %
Eosinophils Absolute: 0 10*3/uL (ref 0.0–0.5)
Eosinophils Relative: 0 %
HCT: 25.2 % — ABNORMAL LOW (ref 36.0–46.0)
Hemoglobin: 8 g/dL — ABNORMAL LOW (ref 12.0–15.0)
Immature Granulocytes: 1 %
Lymphocytes Relative: 14 %
Lymphs Abs: 0.8 10*3/uL (ref 0.7–4.0)
MCH: 32.3 pg (ref 26.0–34.0)
MCHC: 31.7 g/dL (ref 30.0–36.0)
MCV: 101.6 fL — ABNORMAL HIGH (ref 80.0–100.0)
Monocytes Absolute: 0.4 10*3/uL (ref 0.1–1.0)
Monocytes Relative: 8 %
Neutro Abs: 4.4 10*3/uL (ref 1.7–7.7)
Neutrophils Relative %: 77 %
Platelet Count: 197 10*3/uL (ref 150–400)
RBC: 2.48 MIL/uL — ABNORMAL LOW (ref 3.87–5.11)
RDW: 18.9 % — ABNORMAL HIGH (ref 11.5–15.5)
WBC Count: 5.7 10*3/uL (ref 4.0–10.5)
nRBC: 0.4 % — ABNORMAL HIGH (ref 0.0–0.2)

## 2020-11-29 LAB — SAMPLE TO BLOOD BANK

## 2020-11-29 LAB — PREPARE RBC (CROSSMATCH)

## 2020-11-29 MED ORDER — HEPARIN SOD (PORK) LOCK FLUSH 100 UNIT/ML IV SOLN
500.0000 [IU] | Freq: Once | INTRAVENOUS | Status: AC
  Administered 2020-11-29: 500 [IU]

## 2020-11-29 MED ORDER — SODIUM CHLORIDE 0.9% FLUSH
10.0000 mL | Freq: Once | INTRAVENOUS | Status: AC
  Administered 2020-11-29: 10 mL

## 2020-11-30 ENCOUNTER — Inpatient Hospital Stay: Payer: Medicare Other

## 2020-11-30 VITALS — BP 106/62 | HR 94 | Temp 98.9°F | Resp 18

## 2020-11-30 DIAGNOSIS — C169 Malignant neoplasm of stomach, unspecified: Secondary | ICD-10-CM | POA: Diagnosis not present

## 2020-11-30 MED ORDER — HEPARIN SOD (PORK) LOCK FLUSH 100 UNIT/ML IV SOLN
500.0000 [IU] | Freq: Every day | INTRAVENOUS | Status: AC | PRN
  Administered 2020-11-30: 500 [IU]

## 2020-11-30 MED ORDER — SODIUM CHLORIDE 0.9% IV SOLUTION
250.0000 mL | Freq: Once | INTRAVENOUS | Status: AC
  Administered 2020-11-30: 250 mL via INTRAVENOUS

## 2020-11-30 MED ORDER — SODIUM CHLORIDE 0.9% FLUSH
10.0000 mL | INTRAVENOUS | Status: AC | PRN
  Administered 2020-11-30: 10 mL

## 2020-11-30 NOTE — Patient Instructions (Signed)
Blood Transfusion, Adult, Care After This sheet gives you information about how to care for yourself after your procedure. Your doctor may also give you more specific instructions. If youhave problems or questions, contact your doctor. What can I expect after the procedure? After the procedure, it is common to have: Bruising and soreness at the IV site. A fever or chills on the day of the procedure. This may be your body's response to the new blood cells received. A headache. Follow these instructions at home: Insertion site care     Follow instructions from your doctor about how to take care of your insertion site. This is where an IV tube was put into your vein. Make sure you: Wash your hands with soap and water before and after you change your bandage (dressing). If you cannot use soap and water, use hand sanitizer. Change your bandage as told by your doctor. Check your insertion site every day for signs of infection. Check for: Redness, swelling, or pain. Bleeding from the site. Warmth. Pus or a bad smell. General instructions Take over-the-counter and prescription medicines only as told by your doctor. Rest as told by your doctor. Go back to your normal activities as told by your doctor. Keep all follow-up visits as told by your doctor. This is important. Contact a doctor if: You have itching or red, swollen areas of skin (hives). You feel worried or nervous (anxious). You feel weak after doing your normal activities. You have redness, swelling, warmth, or pain around the insertion site. You have blood coming from the insertion site, and the blood does not stop with pressure. You have pus or a bad smell coming from the insertion site. Get help right away if: You have signs of a serious reaction. This may be coming from an allergy or the body's defense system (immune system). Signs include: Trouble breathing or shortness of breath. Swelling of the face or feeling warm  (flushed). Fever or chills. Head, chest, or back pain. Dark pee (urine) or blood in the pee. Widespread rash. Fast heartbeat. Feeling dizzy or light-headed. You may receive your blood transfusion in an outpatient setting. If so, youwill be told whom to contact to report any reactions. These symptoms may be an emergency. Do not wait to see if the symptoms will go away. Get medical help right away. Call your local emergency services (911 in the [place] Do not drive yourself to the hospital. Summary Bruising and soreness at the IV site are common. Check your insertion site every day for signs of infection. Rest as told by your doctor. Go back to your normal activities as told by your doctor. Get help right away if you have signs of a serious reaction. This information is not intended to replace advice given to you by your health care provider. Make sure you discuss any questions you have with your healthcare provider. Document Revised: 09/26/2018 Document Reviewed: 09/26/2018 Elsevier Patient Education  2022 [employer].  

## 2020-12-01 LAB — TYPE AND SCREEN
ABO/RH(D): A POS
Antibody Screen: NEGATIVE
Unit division: 0
Unit division: 0

## 2020-12-01 LAB — BPAM RBC
Blood Product Expiration Date: 202209082359
Blood Product Expiration Date: 202209082359
ISSUE DATE / TIME: 202208160744
ISSUE DATE / TIME: 202208160744
Unit Type and Rh: 6200
Unit Type and Rh: 6200

## 2020-12-02 ENCOUNTER — Inpatient Hospital Stay: Payer: Medicare Other

## 2020-12-02 ENCOUNTER — Other Ambulatory Visit: Payer: Medicare Other

## 2020-12-03 ENCOUNTER — Inpatient Hospital Stay: Payer: Medicare Other

## 2020-12-03 ENCOUNTER — Other Ambulatory Visit: Payer: Medicare Other

## 2020-12-03 ENCOUNTER — Other Ambulatory Visit: Payer: Self-pay

## 2020-12-03 DIAGNOSIS — C169 Malignant neoplasm of stomach, unspecified: Secondary | ICD-10-CM

## 2020-12-03 DIAGNOSIS — Z95828 Presence of other vascular implants and grafts: Secondary | ICD-10-CM

## 2020-12-03 LAB — CBC WITH DIFFERENTIAL (CANCER CENTER ONLY)
Abs Immature Granulocytes: 0.04 10*3/uL (ref 0.00–0.07)
Basophils Absolute: 0 10*3/uL (ref 0.0–0.1)
Basophils Relative: 0 %
Eosinophils Absolute: 0 10*3/uL (ref 0.0–0.5)
Eosinophils Relative: 0 %
HCT: 33.7 % — ABNORMAL LOW (ref 36.0–46.0)
Hemoglobin: 10.6 g/dL — ABNORMAL LOW (ref 12.0–15.0)
Immature Granulocytes: 1 %
Lymphocytes Relative: 19 %
Lymphs Abs: 1.1 10*3/uL (ref 0.7–4.0)
MCH: 30.5 pg (ref 26.0–34.0)
MCHC: 31.5 g/dL (ref 30.0–36.0)
MCV: 97.1 fL (ref 80.0–100.0)
Monocytes Absolute: 0.5 10*3/uL (ref 0.1–1.0)
Monocytes Relative: 10 %
Neutro Abs: 4 10*3/uL (ref 1.7–7.7)
Neutrophils Relative %: 70 %
Platelet Count: 133 10*3/uL — ABNORMAL LOW (ref 150–400)
RBC: 3.47 MIL/uL — ABNORMAL LOW (ref 3.87–5.11)
RDW: 20.6 % — ABNORMAL HIGH (ref 11.5–15.5)
WBC Count: 5.7 10*3/uL (ref 4.0–10.5)
nRBC: 0 % (ref 0.0–0.2)

## 2020-12-03 LAB — SAMPLE TO BLOOD BANK

## 2020-12-03 MED ORDER — HEPARIN SOD (PORK) LOCK FLUSH 100 UNIT/ML IV SOLN
500.0000 [IU] | Freq: Once | INTRAVENOUS | Status: AC
  Administered 2020-12-03: 500 [IU]

## 2020-12-03 MED ORDER — SODIUM CHLORIDE 0.9% FLUSH
10.0000 mL | Freq: Once | INTRAVENOUS | Status: AC
  Administered 2020-12-03: 10 mL

## 2020-12-03 NOTE — Patient Instructions (Signed)
Implanted Port Home Guide An implanted port is a device that is placed under the skin. It is usually placed in the chest. The device can be used to give IV medicine, to take blood, or for dialysis. You may have an implanted port if: You need IV medicine that would be irritating to the small veins in your hands or arms. You need IV medicines, such as antibiotics, for a long period of time. You need IV nutrition for a long period of time. You need dialysis. When you have a port, your health care provider can choose to use the port instead of veins in your arms for these procedures. You may have fewer limitations when using a port than you would if you used other types of long-term IVs, and you will likely be able to return to normal activities afteryour incision heals. An implanted port has two main parts: Reservoir. The reservoir is the part where a needle is inserted to give medicines or draw blood. The reservoir is round. After it is placed, it appears as a small, raised area under your skin. Catheter. The catheter is a thin, flexible tube that connects the reservoir to a vein. Medicine that is inserted into the reservoir goes into the catheter and then into the vein. How is my port accessed? To access your port: A numbing cream may be placed on the skin over the port site. Your health care provider will put on a mask and sterile gloves. The skin over your port will be cleaned carefully with a germ-killing soap and allowed to dry. Your health care provider will gently pinch the port and insert a needle into it. Your health care provider will check for a blood return to make sure the port is in the vein and is not clogged. If your port needs to remain accessed to get medicine continuously (constant infusion), your health care provider will place a clear bandage (dressing) over the needle site. The dressing and needle will need to be changed every week, or as told by your health care provider. What  is flushing? Flushing helps keep the port from getting clogged. Follow instructions from your health care provider about how and when to flush the port. Ports are usually flushed with saline solution or a medicine called heparin. The need for flushing will depend on how the port is used: If the port is only used from time to time to give medicines or draw blood, the port may need to be flushed: Before and after medicines have been given. Before and after blood has been drawn. As part of routine maintenance. Flushing may be recommended every 4-6 weeks. If a constant infusion is running, the port may not need to be flushed. Throw away any syringes in a disposal container that is meant for sharp items (sharps container). You can buy a sharps container from a pharmacy, or you can make one by using an empty hard plastic bottle with a cover. How long will my port stay implanted? The port can stay in for as long as your health care provider thinks it is needed. When it is time for the port to come out, a surgery will be done to remove it. The surgery will be similar to the procedure that was done to putthe port in. Follow these instructions at home:  Flush your port as told by your health care provider. If you need an infusion over several days, follow instructions from your health care provider about how to take   care of your port site. Make sure you: Wash your hands with soap and water before you change your dressing. If soap and water are not available, use alcohol-based hand sanitizer. Change your dressing as told by your health care provider. Place any used dressings or infusion bags into a plastic bag. Throw that bag in the trash. Keep the dressing that covers the needle clean and dry. Do not get it wet. Do not use scissors or sharp objects near the tube. Keep the tube clamped, unless it is being used. Check your port site every day for signs of infection. Check for: Redness, swelling, or  pain. Fluid or blood. Pus or a bad smell. Protect the skin around the port site. Avoid wearing bra straps that rub or irritate the site. Protect the skin around your port from seat belts. Place a soft pad over your chest if needed. Bathe or shower as told by your health care provider. The site may get wet as long as you are not actively receiving an infusion. Return to your normal activities as told by your health care provider. Ask your health care provider what activities are safe for you. Carry a medical alert card or wear a medical alert bracelet at all times. This will let health care providers know that you have an implanted port in case of an emergency. Get help right away if: You have redness, swelling, or pain at the port site. You have fluid or blood coming from your port site. You have pus or a bad smell coming from the port site. You have a fever. Summary Implanted ports are usually placed in the chest for long-term IV access. Follow instructions from your health care provider about flushing the port and changing bandages (dressings). Take care of the area around your port by avoiding clothing that puts pressure on the area, and by watching for signs of infection. Protect the skin around your port from seat belts. Place a soft pad over your chest if needed. Get help right away if you have a fever or you have redness, swelling, pain, drainage, or a bad smell at the port site. This information is not intended to replace advice given to you by your health care provider. Make sure you discuss any questions you have with your healthcare provider. Document Revised: 08/18/2019 Document Reviewed: 08/18/2019 Elsevier Patient Education  2022 Elsevier Inc.  

## 2020-12-06 ENCOUNTER — Inpatient Hospital Stay: Payer: Medicare Other

## 2020-12-06 ENCOUNTER — Inpatient Hospital Stay (HOSPITAL_BASED_OUTPATIENT_CLINIC_OR_DEPARTMENT_OTHER): Payer: Medicare Other | Admitting: Nurse Practitioner

## 2020-12-06 ENCOUNTER — Encounter: Payer: Self-pay | Admitting: Nurse Practitioner

## 2020-12-06 ENCOUNTER — Other Ambulatory Visit: Payer: Self-pay

## 2020-12-06 ENCOUNTER — Inpatient Hospital Stay: Payer: Medicare Other | Admitting: Nurse Practitioner

## 2020-12-06 VITALS — BP 100/62 | HR 112 | Temp 97.8°F | Resp 18 | Ht 64.0 in | Wt 166.0 lb

## 2020-12-06 DIAGNOSIS — C169 Malignant neoplasm of stomach, unspecified: Secondary | ICD-10-CM

## 2020-12-06 LAB — CBC WITH DIFFERENTIAL (CANCER CENTER ONLY)
Abs Immature Granulocytes: 0.01 10*3/uL (ref 0.00–0.07)
Basophils Absolute: 0 10*3/uL (ref 0.0–0.1)
Basophils Relative: 0 %
Eosinophils Absolute: 0 10*3/uL (ref 0.0–0.5)
Eosinophils Relative: 1 %
HCT: 26.5 % — ABNORMAL LOW (ref 36.0–46.0)
Hemoglobin: 8.3 g/dL — ABNORMAL LOW (ref 12.0–15.0)
Immature Granulocytes: 0 %
Lymphocytes Relative: 18 %
Lymphs Abs: 0.9 10*3/uL (ref 0.7–4.0)
MCH: 30.9 pg (ref 26.0–34.0)
MCHC: 31.3 g/dL (ref 30.0–36.0)
MCV: 98.5 fL (ref 80.0–100.0)
Monocytes Absolute: 0.4 10*3/uL (ref 0.1–1.0)
Monocytes Relative: 7 %
Neutro Abs: 3.6 10*3/uL (ref 1.7–7.7)
Neutrophils Relative %: 74 %
Platelet Count: 192 10*3/uL (ref 150–400)
RBC: 2.69 MIL/uL — ABNORMAL LOW (ref 3.87–5.11)
RDW: 19.4 % — ABNORMAL HIGH (ref 11.5–15.5)
WBC Count: 4.8 10*3/uL (ref 4.0–10.5)
nRBC: 0 % (ref 0.0–0.2)

## 2020-12-06 LAB — PREPARE RBC (CROSSMATCH)

## 2020-12-06 LAB — SAMPLE TO BLOOD BANK

## 2020-12-06 MED ORDER — SODIUM CHLORIDE 0.9% IV SOLUTION
250.0000 mL | Freq: Once | INTRAVENOUS | Status: AC
  Administered 2020-12-06: 250 mL via INTRAVENOUS

## 2020-12-06 MED ORDER — HEPARIN SOD (PORK) LOCK FLUSH 100 UNIT/ML IV SOLN
500.0000 [IU] | Freq: Every day | INTRAVENOUS | Status: AC | PRN
  Administered 2020-12-06: 500 [IU]

## 2020-12-06 MED ORDER — SODIUM CHLORIDE 0.9% FLUSH: 10.0000 mL | INTRAVENOUS | Status: DC | PRN

## 2020-12-06 MED ORDER — SODIUM CHLORIDE 0.9% FLUSH
10.0000 mL | INTRAVENOUS | Status: AC | PRN
  Administered 2020-12-06: 10 mL

## 2020-12-06 NOTE — Patient Instructions (Signed)
Blood Transfusion, Adult, Care After This sheet gives you information about how to care for yourself after your procedure. Your doctor may also give you more specific instructions. If youhave problems or questions, contact your doctor. What can I expect after the procedure? After the procedure, it is common to have: Bruising and soreness at the IV site. A fever or chills on the day of the procedure. This may be your body's response to the new blood cells received. A headache. Follow these instructions at home: Insertion site care     Follow instructions from your doctor about how to take care of your insertion site. This is where an IV tube was put into your vein. Make sure you: Wash your hands with soap and water before and after you change your bandage (dressing). If you cannot use soap and water, use hand sanitizer. Change your bandage as told by your doctor. Check your insertion site every day for signs of infection. Check for: Redness, swelling, or pain. Bleeding from the site. Warmth. Pus or a bad smell. General instructions Take over-the-counter and prescription medicines only as told by your doctor. Rest as told by your doctor. Go back to your normal activities as told by your doctor. Keep all follow-up visits as told by your doctor. This is important. Contact a doctor if: You have itching or red, swollen areas of skin (hives). You feel worried or nervous (anxious). You feel weak after doing your normal activities. You have redness, swelling, warmth, or pain around the insertion site. You have blood coming from the insertion site, and the blood does not stop with pressure. You have pus or a bad smell coming from the insertion site. Get help right away if: You have signs of a serious reaction. This may be coming from an allergy or the body's defense system (immune system). Signs include: Trouble breathing or shortness of breath. Swelling of the face or feeling warm  (flushed). Fever or chills. Head, chest, or back pain. Dark pee (urine) or blood in the pee. Widespread rash. Fast heartbeat. Feeling dizzy or light-headed. You may receive your blood transfusion in an outpatient setting. If so, youwill be told whom to contact to report any reactions. These symptoms may be an emergency. Do not wait to see if the symptoms will go away. Get medical help right away. Call your local emergency services (911 in the [place] Do not drive yourself to the hospital. Summary Bruising and soreness at the IV site are common. Check your insertion site every day for signs of infection. Rest as told by your doctor. Go back to your normal activities as told by your doctor. Get help right away if you have signs of a serious reaction. This information is not intended to replace advice given to you by your health care provider. Make sure you discuss any questions you have with your healthcare provider. Document Revised: 09/26/2018 Document Reviewed: 09/26/2018 Elsevier Patient Education  2022 [employer].  

## 2020-12-06 NOTE — Progress Notes (Signed)
[place] OFFICE PROGRESS NOTE   Diagnosis: Gastric cancer, anemia  INTERVAL HISTORY:   Michelle Cain returns as scheduled.  She was transfused 2 units of blood 11/30/2020.  Follow-up CBC 12/03/2020 returned with a hemoglobin of 10.6.  She had 2 loose black stools over the weekend.  Continued intermittent nausea/vomiting.  Blood periodically noted in emesis.  She describes her pain as "okay".  She continues to have intermittent leg swelling.  Objective:  Vital signs in last 24 hours:  Blood pressure 100/62, pulse (!) 112, temperature 97.8 F (36.6 C), resp. rate 18, height _0  (1.626 m), weight 166 lb (75.3 kg), SpO2 100 %.    Resp: Lungs clear bilaterally. Cardio: Regular, tachycardic. GI: Abdomen soft, diffuse tenderness.  No hepatomegaly. Vascular: Pitting edema lower leg bilaterally. Neuro: Alert and oriented. Skin: Pale appearing. Port-A-Cath without erythema.   Lab Results:  Lab Results  Component Value Date   WBC 4.8 12/06/2020   HGB 8.3 (L) 12/06/2020   HCT 26.5 (L) 12/06/2020   MCV 98.5 12/06/2020   PLT 192 12/06/2020   NEUTROABS 3.6 12/06/2020    Imaging:  No results found.  Medications: I have reviewed the patient's current medications.  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, no loss of mismatch repair protein expression, HER-2 negative by FISH, GATA3 negative, ER negative CTs revealed wall thickening and luminal narrowing of the colon at the hepatic flexure and cecum PET scan with no evidence of distant metastatic disease or abnormal uptake other than thickening of the distal esophagus and proximal stomach Colonoscopy 10/05/2017 at [facility] revealed multiple foci of thickening/masses at the cecum, hepatic flexure, and distal rectum, biopsy from the cecum and hepatic flexure revealed metastatic adenocarcinoma of gastric origin, 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, PD-L1 combined positive score 2 on hepatic flexure biopsy CTs 10/23/2017-diffuse prominence of the gastric wall, especially the antrum, focal wall thickening of the distal ascending colon and hepatic flexure, thickening of the cecum, nonspecific haziness of the mesenteric fat in the pelvis, mild prominence of lymph nodes at the greater curvature of the stomach Treatment with FOLFOX beginning July 2019 CTs October 2019-stable disease, FOLFOX continued CTs December 2019-stable disease January 2020 treatment transition to Xeloda maintenance, care transition to [place] CTs in June 2020 in September 2020-stable disease, Xeloda continued CTs 06/23/2019-nonspecific thickening of the GE junction, intraluminal bladder mass Cystoscopy-metastatic signet ring cell adenocarcinoma Cycle 1 day 1 ramucirumab/paclitaxel 08/19/2019 (given at another facility) Cycle 1 day 1 ramucirumab/paclitaxel 09/04/2019 Cycle 1 day 8 Taxol 09/10/19 Cycle 1 day 15 ramucirumab/paclitaxel 09/17/2019 canceled d/t neutropenia Ramucirumab/pactlitaxel every 2 weeks 09/24/2019 Ramucirumab/paclitaxel 10/08/2019 Ramucirumab/paclitaxel 10/22/2019 Ramucirumab/paclitaxel 11/05/2019 CTs 11/24/2019-unchanged thickening of the distal esophagus/upper stomach, gastric body and antrum.  Unchanged thickening of the transverse colon at the hepatic flexure, unchanged thickening of the urinary bladder, no evidence of progressive disease Ramucirumab/paclitaxel 11/25/2019 Ramucirumab/paclitaxel 12/09/2019  Ramucirumab/paclitaxel 12/23/2019 Ramucirumab/paclitaxel 01/06/2020 Ramucirumab/paclitaxel 01/20/2020 Ramucirumab/paclitaxel 02/04/2020 Ramucirumab/paclitaxel  02/17/2020 Ramucirumab/paclitaxel 03/02/2020 Ramucirumab/paclitaxel 03/16/2020 CTs 03/28/2020-stable thickening of the distal esophagus, GE junction, gastric body, and antrum.  Decreased soft tissue thickening of the transverse colon at the hepatic flexure, no evidence of metastatic disease to the chest Ramucirumab/paclitaxel 03/30/2020 SBRT to the stomach 04/12/2020-05/06/2020, 37.5 Gray in 15 fractions Ramucirumab/paclitaxel 04/05/2020  Ramucirumab/paclitaxel 04/13/2020 Ramucirumab/paclitaxel 04/27/2020 Ramucirumab/paclitaxel 05/25/2020 Ramucirumab/paclitaxel 06/11/2020 Ramucirumab/paclitaxel 06/22/2020 Ramucirumab/paclitaxel 07/06/2020 Ramucirumab/paclitaxel 07/20/2020 CT abdomen/pelvis 08/03/2020- wall thickening at the distal esophagus and stomach body/antrum-progressive, indistinct stranding around the descending duodenum, porta hepatis, and retroperitoneum- stable, abnormal wall thickening at the left upper urinary bladder-stable Ramucirumab/paclitaxel 08/11/2020 Upper endoscopy 08/26/2020- gastric mucosa nearly completely replaced with cancer.  Most proximal aspect of the stomach was spared but starting at the proximal gastric body there was extensive, circumferential cancer that continued into the proximal duodenal bulb.  The tumor is ulcerated in some areas, edematous throughout, very friable with spontaneous oozing.  The lumen of the distal stomach was narrowed due to edematous malignant changes but this did not limit scope passage. Guardant 360 09/15/2020-CDH1,PIK3CA Palliative radiation to the stomach at [facility], 4 fractions completed 09/28/2020 Supportive care with transfusion support   2.  Left breast cancer 2008,pT1c,pN0, status post a left lumpectomy with adjuvant chemotherapy and radiation, ER positive, PR positive, HER-2 positive   3.  Mixed connective tissue disease/SLE   4.  Lower extremity deep vein thrombosis maintained on apixaban-placed on hold due to hematemesis, hematuria 08/19/2020.   See findings of upper endoscopy from 08/26/2020.  Dr. Ardis Hughs recommends against ever resuming a blood thinner.   5.  Family history of multiple cancers including breast and ovarian cancer   6.  Dysphagia and intermittent vomiting secondary to #1   7.  Hypertension   8.  Peripheral neuropathy   9.  Masslike fullness at the posterior right parotid/angle of the jaw   10. Dyspnea on exertion, ongoing for about a month, worsened after taxol/ramucirumab on 09/04/19 requiring ED visit on 5/23. CBC, CMP, troponin, BNP and chest xray negative    11.  Anemia likely secondary to combination of chemotherapy and blood loss 2 units packed red blood cells 04/12/2020, 07/20/2020 12.  Admission 05/11/2020 with pneumonia, CT chest revealed left upper and lower lobe opacities 13.  Progressive abdominal pain beginning 07/29/2020- etiology unclear, likely related to progression of tumor in the stomach 14.  Massive PE with cor pulmonale May 2022 15.  Hospital admission 09/08/2020 massive syncope, PE, symptomatic anemia. Bilateral pulmonary angiogram and mechanical thrombectomy 09/09/2020 IVC filter placement 09/12/2020 Lovenox adjusted to 50 mg every 12 hours while admitted at [facility] June 2022  16.  Hospitalized at [facility] 11/02/2020 through 11/14/2020 with hematemesis, melena, syncope.  Transfused multiple units of blood.  No longer felt to be a candidate for chemotherapy.  Discharge plan for continued transfusion support twice weekly.      Disposition: Michelle Cain appears unchanged.  Performance status slowly declining.  Plan to continue as needed Red cell transfusion support.  She will receive 2 units today.  She will return for a follow-up CBC later this week.  We will schedule Red cell transfusions as needed.  We will see her in follow-up on 12/16/2020.    Ned Card ANP/GNP-BC   12/06/2020  9:39 AM

## 2020-12-07 LAB — TYPE AND SCREEN
ABO/RH(D): A POS
Antibody Screen: NEGATIVE
Unit division: 0
Unit division: 0

## 2020-12-07 LAB — BPAM RBC
Blood Product Expiration Date: 202209082359
Blood Product Expiration Date: 202209082359
ISSUE DATE / TIME: 202208221021
ISSUE DATE / TIME: 202208221021
Unit Type and Rh: 6200
Unit Type and Rh: 6200

## 2020-12-09 ENCOUNTER — Other Ambulatory Visit: Payer: Medicare Other

## 2020-12-09 ENCOUNTER — Inpatient Hospital Stay: Payer: Medicare Other

## 2020-12-09 ENCOUNTER — Other Ambulatory Visit: Payer: Self-pay

## 2020-12-09 DIAGNOSIS — C169 Malignant neoplasm of stomach, unspecified: Secondary | ICD-10-CM | POA: Diagnosis not present

## 2020-12-09 LAB — CBC WITH DIFFERENTIAL (CANCER CENTER ONLY)
Abs Immature Granulocytes: 0.04 10*3/uL (ref 0.00–0.07)
Basophils Absolute: 0 10*3/uL (ref 0.0–0.1)
Basophils Relative: 0 %
Eosinophils Absolute: 0.1 10*3/uL (ref 0.0–0.5)
Eosinophils Relative: 1 %
HCT: 35.6 % — ABNORMAL LOW (ref 36.0–46.0)
Hemoglobin: 11.5 g/dL — ABNORMAL LOW (ref 12.0–15.0)
Immature Granulocytes: 1 %
Lymphocytes Relative: 20 %
Lymphs Abs: 1 10*3/uL (ref 0.7–4.0)
MCH: 31.2 pg (ref 26.0–34.0)
MCHC: 32.3 g/dL (ref 30.0–36.0)
MCV: 96.5 fL (ref 80.0–100.0)
Monocytes Absolute: 0.5 10*3/uL (ref 0.1–1.0)
Monocytes Relative: 10 %
Neutro Abs: 3.5 10*3/uL (ref 1.7–7.7)
Neutrophils Relative %: 68 %
Platelet Count: 94 10*3/uL — ABNORMAL LOW (ref 150–400)
RBC: 3.69 MIL/uL — ABNORMAL LOW (ref 3.87–5.11)
RDW: 19.1 % — ABNORMAL HIGH (ref 11.5–15.5)
WBC Count: 5.1 10*3/uL (ref 4.0–10.5)
nRBC: 0 % (ref 0.0–0.2)

## 2020-12-09 LAB — SAMPLE TO BLOOD BANK

## 2020-12-10 ENCOUNTER — Other Ambulatory Visit: Payer: Medicare Other

## 2020-12-10 ENCOUNTER — Inpatient Hospital Stay: Payer: Medicare Other

## 2020-12-13 ENCOUNTER — Inpatient Hospital Stay: Payer: Medicare Other

## 2020-12-13 ENCOUNTER — Other Ambulatory Visit: Payer: Medicare Other

## 2020-12-14 ENCOUNTER — Inpatient Hospital Stay: Payer: Medicare Other

## 2020-12-16 ENCOUNTER — Inpatient Hospital Stay: Payer: Medicare Other

## 2020-12-16 ENCOUNTER — Other Ambulatory Visit: Payer: Self-pay

## 2020-12-16 ENCOUNTER — Inpatient Hospital Stay (HOSPITAL_BASED_OUTPATIENT_CLINIC_OR_DEPARTMENT_OTHER): Payer: Medicare Other | Admitting: Oncology

## 2020-12-16 ENCOUNTER — Telehealth: Payer: Self-pay

## 2020-12-16 ENCOUNTER — Encounter (HOSPITAL_COMMUNITY): Payer: Self-pay | Admitting: Internal Medicine

## 2020-12-16 ENCOUNTER — Inpatient Hospital Stay: Payer: Medicare Other | Attending: Oncology

## 2020-12-16 ENCOUNTER — Inpatient Hospital Stay (HOSPITAL_COMMUNITY)
Admission: AD | Admit: 2020-12-16 | Discharge: 2020-12-18 | DRG: 812 | Disposition: A | Discharge status: Hospice | Payer: Medicare Other | Source: Other Acute Inpatient Hospital | Attending: Internal Medicine | Admitting: Internal Medicine

## 2020-12-16 VITALS — BP 107/75 | HR 105 | Temp 97.9°F | Resp 18 | Ht 64.0 in

## 2020-12-16 DIAGNOSIS — Z7189 Other specified counseling: Secondary | ICD-10-CM | POA: Diagnosis not present

## 2020-12-16 DIAGNOSIS — C169 Malignant neoplasm of stomach, unspecified: Secondary | ICD-10-CM

## 2020-12-16 DIAGNOSIS — M797 Fibromyalgia: Secondary | ICD-10-CM | POA: Diagnosis present

## 2020-12-16 DIAGNOSIS — Z923 Personal history of irradiation: Secondary | ICD-10-CM

## 2020-12-16 DIAGNOSIS — Z9221 Personal history of antineoplastic chemotherapy: Secondary | ICD-10-CM | POA: Diagnosis not present

## 2020-12-16 DIAGNOSIS — Z86718 Personal history of other venous thrombosis and embolism: Secondary | ICD-10-CM | POA: Diagnosis not present

## 2020-12-16 DIAGNOSIS — D6481 Anemia due to antineoplastic chemotherapy: Secondary | ICD-10-CM | POA: Diagnosis present

## 2020-12-16 DIAGNOSIS — Z853 Personal history of malignant neoplasm of breast: Secondary | ICD-10-CM | POA: Diagnosis not present

## 2020-12-16 DIAGNOSIS — I10 Essential (primary) hypertension: Secondary | ICD-10-CM | POA: Diagnosis present

## 2020-12-16 DIAGNOSIS — G629 Polyneuropathy, unspecified: Secondary | ICD-10-CM | POA: Diagnosis present

## 2020-12-16 DIAGNOSIS — D500 Iron deficiency anemia secondary to blood loss (chronic): Principal | ICD-10-CM | POA: Diagnosis present

## 2020-12-16 DIAGNOSIS — Z20822 Contact with and (suspected) exposure to covid-19: Secondary | ICD-10-CM | POA: Diagnosis present

## 2020-12-16 DIAGNOSIS — Z66 Do not resuscitate: Secondary | ICD-10-CM | POA: Diagnosis present

## 2020-12-16 DIAGNOSIS — Z86711 Personal history of pulmonary embolism: Secondary | ICD-10-CM

## 2020-12-16 DIAGNOSIS — R1310 Dysphagia, unspecified: Secondary | ICD-10-CM | POA: Diagnosis present

## 2020-12-16 DIAGNOSIS — D649 Anemia, unspecified: Secondary | ICD-10-CM | POA: Diagnosis not present

## 2020-12-16 DIAGNOSIS — Z515 Encounter for palliative care: Secondary | ICD-10-CM

## 2020-12-16 DIAGNOSIS — D696 Thrombocytopenia, unspecified: Secondary | ICD-10-CM | POA: Diagnosis present

## 2020-12-16 DIAGNOSIS — M329 Systemic lupus erythematosus, unspecified: Secondary | ICD-10-CM | POA: Diagnosis present

## 2020-12-16 DIAGNOSIS — F419 Anxiety disorder, unspecified: Secondary | ICD-10-CM | POA: Diagnosis present

## 2020-12-16 DIAGNOSIS — R531 Weakness: Secondary | ICD-10-CM | POA: Diagnosis present

## 2020-12-16 DIAGNOSIS — Z6829 Body mass index (BMI) 29.0-29.9, adult: Secondary | ICD-10-CM

## 2020-12-16 DIAGNOSIS — K920 Hematemesis: Secondary | ICD-10-CM | POA: Diagnosis present

## 2020-12-16 DIAGNOSIS — E440 Moderate protein-calorie malnutrition: Secondary | ICD-10-CM | POA: Diagnosis present

## 2020-12-16 DIAGNOSIS — Z7902 Long term (current) use of antithrombotics/antiplatelets: Secondary | ICD-10-CM | POA: Diagnosis not present

## 2020-12-16 DIAGNOSIS — I2781 Cor pulmonale (chronic): Secondary | ICD-10-CM | POA: Diagnosis present

## 2020-12-16 LAB — CBC WITH DIFFERENTIAL (CANCER CENTER ONLY)
Abs Immature Granulocytes: 0.28 10*3/uL — ABNORMAL HIGH (ref 0.00–0.07)
Basophils Absolute: 0 10*3/uL (ref 0.0–0.1)
Basophils Relative: 0 %
Eosinophils Absolute: 0 10*3/uL (ref 0.0–0.5)
Eosinophils Relative: 0 %
HCT: 24.6 % — ABNORMAL LOW (ref 36.0–46.0)
Hemoglobin: 7.8 g/dL — ABNORMAL LOW (ref 12.0–15.0)
Immature Granulocytes: 2 %
Lymphocytes Relative: 8 %
Lymphs Abs: 1 10*3/uL (ref 0.7–4.0)
MCH: 31.3 pg (ref 26.0–34.0)
MCHC: 31.7 g/dL (ref 30.0–36.0)
MCV: 98.8 fL (ref 80.0–100.0)
Monocytes Absolute: 1.4 10*3/uL — ABNORMAL HIGH (ref 0.1–1.0)
Monocytes Relative: 11 %
Neutro Abs: 10.3 10*3/uL — ABNORMAL HIGH (ref 1.7–7.7)
Neutrophils Relative %: 79 %
Platelet Count: 88 10*3/uL — ABNORMAL LOW (ref 150–400)
RBC: 2.49 MIL/uL — ABNORMAL LOW (ref 3.87–5.11)
RDW: 20.3 % — ABNORMAL HIGH (ref 11.5–15.5)
WBC Count: 13 10*3/uL — ABNORMAL HIGH (ref 4.0–10.5)
nRBC: 6.1 % — ABNORMAL HIGH (ref 0.0–0.2)

## 2020-12-16 LAB — SAMPLE TO BLOOD BANK

## 2020-12-16 LAB — PREPARE RBC (CROSSMATCH)

## 2020-12-16 MED ORDER — DRONABINOL 2.5 MG PO CAPS
2.5000 mg | ORAL_CAPSULE | Freq: Two times a day (BID) | ORAL | Status: DC
  Administered 2020-12-17 – 2020-12-18 (×3): 2.5 mg via ORAL
  Filled 2020-12-16 (×3): qty 1

## 2020-12-16 MED ORDER — ACETAMINOPHEN 325 MG PO TABS: 650.0000 mg | ORAL_TABLET | Freq: Four times a day (QID) | ORAL | Status: DC | PRN

## 2020-12-16 MED ORDER — PANTOPRAZOLE SODIUM 40 MG PO TBEC
40.0000 mg | DELAYED_RELEASE_TABLET | Freq: Two times a day (BID) | ORAL | Status: DC
  Administered 2020-12-16 – 2020-12-18 (×4): 40 mg via ORAL
  Filled 2020-12-16 (×4): qty 1

## 2020-12-16 MED ORDER — SODIUM CHLORIDE 0.9% IV SOLUTION: Freq: Once | INTRAVENOUS | Status: AC

## 2020-12-16 MED ORDER — ONDANSETRON HCL 4 MG PO TABS
4.0000 mg | ORAL_TABLET | Freq: Four times a day (QID) | ORAL | Status: DC | PRN
  Administered 2020-12-17: 4 mg via ORAL
  Filled 2020-12-16 (×2): qty 1

## 2020-12-16 MED ORDER — ONDANSETRON HCL 4 MG/2ML IJ SOLN
4.0000 mg | Freq: Four times a day (QID) | INTRAMUSCULAR | Status: DC | PRN
  Administered 2020-12-18: 4 mg via INTRAVENOUS
  Filled 2020-12-16 (×2): qty 2

## 2020-12-16 MED ORDER — OXYCODONE HCL 5 MG PO TABS
5.0000 mg | ORAL_TABLET | Freq: Four times a day (QID) | ORAL | Status: DC | PRN
  Administered 2020-12-16 – 2020-12-17 (×2): 5 mg via ORAL
  Filled 2020-12-16 (×2): qty 1

## 2020-12-16 MED ORDER — CHLORHEXIDINE GLUCONATE CLOTH 2 % EX PADS
6.0000 | MEDICATED_PAD | Freq: Every day | CUTANEOUS | Status: DC
  Administered 2020-12-16 – 2020-12-18 (×2): 6 via TOPICAL

## 2020-12-16 MED ORDER — ACETAMINOPHEN 650 MG RE SUPP: 650.0000 mg | Freq: Four times a day (QID) | RECTAL | Status: DC | PRN

## 2020-12-16 MED ORDER — METOCLOPRAMIDE HCL 5 MG/ML IJ SOLN
5.0000 mg | Freq: Three times a day (TID) | INTRAMUSCULAR | Status: DC | PRN
  Filled 2020-12-16: qty 2

## 2020-12-16 NOTE — Progress Notes (Signed)
    OVERNIGHT PROGRESS REPORT  Notified by Nursing staff for completion of Blood products and for completion of Dr [facility] Care order intent, Order in per Care order intent   Gershon Cull MSNA MSN [id number]-AG Acute Care Nurse Practitioner [place]

## 2020-12-16 NOTE — Progress Notes (Signed)
[place] OFFICE PROGRESS NOTE   Diagnosis: Gastric cancer  INTERVAL HISTORY:   Michelle Cain developed hematemesis on 12/12/2020 and presented to an outside hospital.  She was transferred to [facility].  She received 1 unit of packed red blood cells during transport.  A CBC in the a.m. at [facility] on 12/13/2020 found the hemoglobin at 8.6, and platelets 74,000.  She was discharged home from the [facility] emergency room.  She was last transfused with 2 units of packed red blood cells here on 12/06/2020.  The hemoglobin returned at 11.5 on 12/09/2020.  She is here today with her mother.  She continues to have frequent nausea/vomiting and hematemesis.  She is eating and drinking very little.  Her mother acknowledges Michelle Cain has become much weaker over the past several weeks.  Objective:  Vital signs in last 24 hours:  Blood pressure 107/75, pulse (!) 105, temperature 97.9 F (36.6 C), temperature source Oral, resp. rate 18, height 5' 4" (1.626 m), SpO2 98 %.    HEENT: No thrush Resp: Lungs clear bilaterally Cardio: Regular rhythm, tachycardia GI: Diffuse tenderness, no mass Vascular: No leg edema Neuro: Alert, follows commands    Portacath/PICC-without erythema  Lab Results:  Lab Results  Component Value Date   WBC 13.0 (H) 12/16/2020   HGB 7.8 (L) 12/16/2020   HCT 24.6 (L) 12/16/2020   MCV 98.8 12/16/2020   PLT 88 (L) 12/16/2020   NEUTROABS 10.3 (H) 12/16/2020    CMP  Lab Results  Component Value Date   NA 136 10/22/2020   K 3.9 10/22/2020   CL 102 10/22/2020   CO2 26 10/22/2020   GLUCOSE 103 (H) 10/22/2020   BUN 8 10/22/2020   CREATININE 0.43 (L) 10/22/2020   CALCIUM 8.6 (L) 10/22/2020   PROT 5.9 (L) 10/22/2020   ALBUMIN 2.9 (L) 10/22/2020   AST 39 10/22/2020   ALT 24 10/22/2020   ALKPHOS 119 10/22/2020   BILITOT 0.5 10/22/2020   GFRNONAA >60 10/22/2020   GFRAA >60 01/06/2020    Lab Results  Component Value Date   CEA1 3.45 09/04/2019     Lab Results  Component Value Date   INR 1.0 09/09/2020   LABPROT 13.6 09/09/2020    Medications: I have reviewed the patient's current medications.   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, no loss of mismatch repair protein expression, HER-2 negative by FISH, GATA3 negative, ER negative CTs revealed wall thickening and luminal narrowing of the colon at the hepatic flexure and cecum PET scan with no evidence of distant metastatic disease or abnormal uptake other than thickening of the distal esophagus and proximal stomach Colonoscopy 10/05/2017 at [facility] revealed multiple foci of thickening/masses at the cecum, hepatic flexure, and distal rectum, biopsy from the cecum and hepatic flexure revealed metastatic adenocarcinoma of gastric origin, 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, PD-L1 combined positive score 2 on hepatic flexure biopsy CTs 10/23/2017-diffuse prominence of the gastric wall, especially the antrum, focal wall thickening of the distal ascending colon and hepatic flexure, thickening of the cecum, nonspecific haziness of the mesenteric fat in the pelvis, mild prominence of lymph nodes at the greater curvature of the stomach Treatment with FOLFOX beginning July 2019 CTs October 2019-stable disease, FOLFOX continued CTs December 2019-stable disease  January 2020 treatment transition to Xeloda maintenance, care transition to [place] CTs in June 2020 in September 2020-stable disease, Xeloda continued CTs 06/23/2019-nonspecific thickening of the GE junction, intraluminal bladder mass Cystoscopy-metastatic signet ring cell  adenocarcinoma Cycle 1 day 1 ramucirumab/paclitaxel 08/19/2019 (given at another facility) Cycle 1 day 1 ramucirumab/paclitaxel 09/04/2019 Cycle 1 day 8 Taxol 09/10/19 Cycle 1 day 15 ramucirumab/paclitaxel 09/17/2019 canceled d/t neutropenia Ramucirumab/pactlitaxel every 2 weeks 09/24/2019 Ramucirumab/paclitaxel 10/08/2019 Ramucirumab/paclitaxel 10/22/2019 Ramucirumab/paclitaxel 11/05/2019 CTs 11/24/2019-unchanged thickening of the distal esophagus/upper stomach, gastric body and antrum.  Unchanged thickening of the transverse colon at the hepatic flexure, unchanged thickening of the urinary bladder, no evidence of progressive disease Ramucirumab/paclitaxel 11/25/2019 Ramucirumab/paclitaxel 12/09/2019  Ramucirumab/paclitaxel 12/23/2019 Ramucirumab/paclitaxel 01/06/2020 Ramucirumab/paclitaxel 01/20/2020 Ramucirumab/paclitaxel 02/04/2020 Ramucirumab/paclitaxel 02/17/2020 Ramucirumab/paclitaxel 03/02/2020 Ramucirumab/paclitaxel 03/16/2020 CTs 03/28/2020-stable thickening of the distal esophagus, GE junction, gastric body, and antrum.  Decreased soft tissue thickening of the transverse colon at the hepatic flexure, no evidence of metastatic disease to the chest Ramucirumab/paclitaxel 03/30/2020 SBRT to the stomach 04/12/2020-05/06/2020, 37.5 Gray in 15 fractions Ramucirumab/paclitaxel 04/05/2020 Ramucirumab/paclitaxel 04/13/2020 Ramucirumab/paclitaxel 04/27/2020 Ramucirumab/paclitaxel 05/25/2020 Ramucirumab/paclitaxel 06/11/2020 Ramucirumab/paclitaxel 06/22/2020 Ramucirumab/paclitaxel 07/06/2020 Ramucirumab/paclitaxel 07/20/2020 CT abdomen/pelvis 08/03/2020- wall thickening at the distal esophagus and stomach body/antrum-progressive, indistinct stranding around the descending duodenum, porta hepatis, and retroperitoneum- stable, abnormal wall thickening at the left upper urinary bladder-stable Ramucirumab/paclitaxel 08/11/2020 Upper endoscopy 08/26/2020- gastric mucosa nearly completely replaced with cancer.  Most  proximal aspect of the stomach was spared but starting at the proximal gastric body there was extensive, circumferential cancer that continued into the proximal duodenal bulb.  The tumor is ulcerated in some areas, edematous throughout, very friable with spontaneous oozing.  The lumen of the distal stomach was narrowed due to edematous malignant changes but this did not limit scope passage. Guardant 360 09/15/2020-CDH1,PIK3CA Palliative radiation to the stomach at [facility], 4 fractions completed 09/28/2020 Supportive care with transfusion support   2.  Left breast cancer 2008,pT1c,pN0, status post a left lumpectomy with adjuvant chemotherapy and radiation, ER positive, PR positive, HER-2 positive   3.  Mixed connective tissue disease/SLE   4.  Lower extremity deep vein thrombosis maintained on apixaban-placed on hold due to hematemesis, hematuria 08/19/2020.  See findings of upper endoscopy from 08/26/2020.  Dr. Ardis Hughs recommends against ever resuming a blood thinner.   5.  Family history of multiple cancers including breast and ovarian cancer   6.  Dysphagia and intermittent vomiting secondary to #1   7.  Hypertension   8.  Peripheral neuropathy   9.  Masslike fullness at the posterior right parotid/angle of the jaw   10. Dyspnea on exertion, ongoing for about a month, worsened after taxol/ramucirumab on 09/04/19 requiring ED visit on 5/23. CBC, CMP, troponin, BNP and chest xray negative    11.  Anemia likely secondary to combination of chemotherapy and blood loss 2 units packed red blood cells 04/12/2020, 07/20/2020 12.  Admission 05/11/2020 with pneumonia, CT chest revealed left upper and lower lobe opacities 13.  Progressive abdominal pain beginning 07/29/2020- etiology unclear, likely related to progression of tumor in the stomach 14.  Massive PE with cor pulmonale May 2022 15.  Hospital admission 09/08/2020 massive syncope, PE, symptomatic anemia. Bilateral pulmonary angiogram and mechanical  thrombectomy 09/09/2020 IVC filter placement 09/12/2020 Lovenox adjusted to 50 mg every 12 hours while admitted at [facility] June 2022  16.  Hospitalized at [facility] 11/02/2020 through 11/14/2020 with hematemesis, melena, syncope.  Transfused multiple units of blood.  No longer felt to be  a candidate for chemotherapy.  Discharge plan for continued transfusion as needed 17.  Thrombocytopenia-potentially related to polypharmacy metastatic disease involving the bone marrow, tumor-associated DIC  Disposition: Michelle Cain has metastatic gastric cancer.  She is symptomatic with local progression of tumor causing nausea/vomiting and pain.  She has bleeding secondary to tumor involving the stomach.  The bleeding is complicated by the need for anticoagulation therapy after recent hospital admission with massive pulmonary embolism.  She continues to require frequent transfusion support and despite transfusions she has a very poor performance status.  I discussed the poor prognosis with Michelle Cain, her mother, and sister.  They understand her prognosis for survival is most likely days to a few weeks.  We discussed CPR and ACLS.  She agrees to no CODE BLUE status.  She will be admitted for comfort measures, palliative care consultation, and to make arrangements for home hospice.  Transfusion has made her feel better over the past few months.  I recommend a Red cell transfusion on hospital admission.  We can then discuss a plan for home hospice care with no further lab draws or transfusions.  I will check on her 9-22.  Betsy Coder, MD  12/16/2020  1:00 PM

## 2020-12-16 NOTE — H&P (Signed)
History and Physical    Michelle Cain [id number] DOB: 06-Oct-1965 DOA: 12/16/2020  PCP: Laurette Schimke, DO  Patient coming from: Home  Chief Complaint: weakness.  HPI: Michelle Cain is a 55 y.o. female with medical history significant of gastric cancer, chronic N/V, chronic GIB secondary to cancer, SLE, PTE on lovenox. Presenting with increased fatigue, weakness. She has been increasingly weak over baseline for the last week. She was seen at a hospital in [place] 4 days ago. At that time she was found to be anemic. She was given blood and transferred to [facility]. Per her report, she was evaluated at [place] 3 days ago and sent home the same day as she was stable at the time. Since being home, she reports less energy, poor PO intake and generalized weakness. She had follow up with her oncologist this morning. While trying to get ready for that appointment, her mother reports that it was quite difficult to get her moving at all. During her appointment, she was seen as generally ill. Onco had a discussion with her about CODE status. She was changed to DNR. Onco discussed hospice options with her. It was recommended that she come to the ED for a transfusion for symptomatic anemia and that she have discussion with the palliative care team about hospice. TRH was called for admission.   Review of Systems:  Review of systems is otherwise negative for all not mentioned in HPI.   PMHx Past Medical History:  Diagnosis Date   Anemia    Arthritis    Breast CA ([place]) dx'd 2007   left   Carpal tunnel syndrome on both sides    DVT (deep venous thrombosis) (HCC)    Left leg   Dyspnea    with activity,    Fibromyalgia    History of blood transfusion 08/17/2020   Lupus ([place])    Pneumonia    History of   Raynaud disease    stomach/colon ca dx'd 2020    PSHx Past Surgical History:  Procedure Laterality Date   BREAST SURGERY Left    with lymph nodes   CHOLECYSTECTOMY      ESOPHAGOGASTRODUODENOSCOPY (EGD) WITH PROPOFOL N/A 08/26/2020   Procedure: ESOPHAGOGASTRODUODENOSCOPY (EGD) WITH PROPOFOL;  Surgeon: Milus Banister, MD;  Location: [facility];  Service: Endoscopy;  Laterality: N/A;   IR ANGIOGRAM PULMONARY BILATERAL SELECTIVE  09/09/2020   IR FLUORO GUIDE CV LINE RIGHT  09/09/2020   IR IVC FILTER PLMT / S&I /IMG GUID/MOD SED  09/12/2020   IR THROMBECT PRIM MECH INIT (INCLU) MOD SED  09/09/2020   IR US GUIDE VASC ACCESS RIGHT  09/09/2020   IR US GUIDE VASC ACCESS RIGHT  09/09/2020   RADIOLOGY WITH ANESTHESIA N/A 09/09/2020   Procedure: PULMONARY EMBOLUS THROMBECTOMY;  Surgeon: Radiologist, Medication, MD;  Location: [facility];  Service: Radiology;  Laterality: N/A;   UPPER GI ENDOSCOPY      SocHx  reports that she has never smoked. She has never used smokeless tobacco. She reports that she does not drink alcohol and does not use drugs.  No Known Allergies  FamHx Family History  Problem Relation Age of Onset   Sudden death Neg Hx    Hypertension Neg Hx    Hyperlipidemia Neg Hx    Heart attack Neg Hx    Diabetes Neg Hx     Prior to Admission medications   Medication Sig Start Date End Date Taking? Authorizing Provider  albuterol (VENTOLIN HFA) 108 (90 Base) MCG/ACT inhaler Inhale  into the lungs.    [provider]  albuterol (VENTOLIN HFA) 108 (90 Base) MCG/ACT inhaler Inhale into the lungs. 05/18/20   [provider]  camphor-menthol Timoteo Ace) lotion Apply topically. 09/24/20 09/24/21  [provider]  clotrimazole (LOTRIMIN) 1 % cream Apply topically 2 (two) times daily. 10/19/20 10/19/21  [provider]  cyclobenzaprine (FLEXERIL) 10 MG tablet [The details of the medication are not available because there are pending changes by a home health clinician.] 10/27/19   [provider]  dronabinol (MARINOL) 2.5 MG capsule Take 1 capsule (2.5 mg total) by mouth 2 (two) times daily. 09/14/20   Ghimire, Henreitta Leber, MD  enoxaparin  (LOVENOX) 40 MG/0.4ML injection Inject into the skin.    [provider]  enoxaparin (LOVENOX) 60 MG/0.6ML injection Inject 0.5 mLs (50 mg total) into the skin every 12 (twelve) hours. Patient taking differently: Inject 40 mg into the skin every 12 (twelve) hours. 10/22/20   Owens Shark, NP  hydroxychloroquine (PLAQUENIL) 200 MG tablet Take 200 mg by mouth daily. 09/25/20 09/25/21  [provider]  hydroxychloroquine (PLAQUENIL) 200 MG tablet Take by mouth. 08/25/20   [provider]  ketoconazole (NIZORAL) 2 % cream Apply topically. 10/19/20 10/19/21  [provider]  metoCLOPramide (REGLAN) 10 MG tablet Take 10 mg by mouth 4 (four) times daily -  before meals and at bedtime.    [provider]  omeprazole (PRILOSEC) 40 MG capsule [The details of the medication are not available because there are pending changes by a home health clinician.] 05/17/18   [provider]  ondansetron (ZOFRAN) 4 MG tablet Take 4 mg by mouth every 8 (eight) hours as needed.    [provider]  ondansetron (ZOFRAN) 4 MG tablet Take by mouth.    [provider]  oxyCODONE (OXY IR/ROXICODONE) 5 MG immediate release tablet Take 1 tablet (5 mg total) by mouth every 6 (six) hours as needed for moderate pain. 10/15/20   Ladell Pier, MD  oxyCODONE (OXY IR/ROXICODONE) 5 MG immediate release tablet Take by mouth.    [provider]  oxyCODONE (OXYCONTIN) 10 mg 12 hr tablet Take by mouth.    [provider]  pantoprazole (PROTONIX) 40 MG tablet Take 1 tablet (40 mg total) by mouth 2 (two) times daily. 09/14/20 09/14/21  Ghimire, Henreitta Leber, MD  pantoprazole (PROTONIX) 40 MG tablet Take by mouth.    [provider]  triamcinolone ointment (KENALOG) 0.1 % Apply topically 2 (two) times daily. 10/11/20   [provider]    Physical Exam: Vitals:   12/16/20 1342  BP: 104/70  Pulse: (!) 105  Resp: 16  Temp: 98.2 F (36.8 C)   TempSrc: Oral  SpO2: 100%    General: 55 y.o. female ill appearing resting in bed Eyes: PERRL, normal sclera ENMT: Nares patent w/o discharge, orophaynx clear, dentition normal, ears w/o discharge/lesions/ulcers Neck: Supple, trachea midline Cardiovascular: RRR, +S1, S2, no m/g/r, equal pulses throughout; left chest port noted Respiratory: CTABL, no w/r/r, normal WOB GI: BS+, NDNT, no masses noted, no organomegaly noted MSK: No e/c/c Skin: No rashes, bruises, ulcerations noted Neuro: A&O x 3, no focal deficits Psyc: Appropriate interaction but flat affect, calm/cooperative  Labs on Admission: I have personally reviewed following labs and imaging studies  CBC: Recent Labs  Lab 12/16/20 0810  WBC 13.0*  NEUTROABS 10.3*  HGB 7.8*  HCT 24.6*  MCV 98.8  PLT 88*   Basic Metabolic Panel: No results  for input(s): NA, K, CL, CO2, GLUCOSE, BUN, CREATININE, CALCIUM, MG, PHOS in the last 168 hours. GFR: CrCl cannot be calculated (Patient's most recent lab result is older than the maximum 21 days allowed.). Liver Function Tests: No results for input(s): AST, ALT, ALKPHOS, BILITOT, PROT, ALBUMIN in the last 168 hours. No results for input(s): LIPASE, AMYLASE in the last 168 hours. No results for input(s): AMMONIA in the last 168 hours. Coagulation Profile: No results for input(s): INR, PROTIME in the last 168 hours. Cardiac Enzymes: No results for input(s): CKTOTAL, CKMB, CKMBINDEX, TROPONINI in the last 168 hours. BNP (last 3 results) No results for input(s): PROBNP in the last 8760 hours. HbA1C: No results for input(s): HGBA1C in the last 72 hours. CBG: No results for input(s): GLUCAP in the last 168 hours. Lipid Profile: No results for input(s): CHOL, HDL, LDLCALC, TRIG, CHOLHDL, LDLDIRECT in the last 72 hours. Thyroid Function Tests: No results for input(s): TSH, T4TOTAL, FREET4, T3FREE, THYROIDAB in the last 72 hours. Anemia Panel: No results for input(s): VITAMINB12,  FOLATE, FERRITIN, TIBC, IRON, RETICCTPCT in the last 72 hours. Urine analysis:    Component Value Date/Time   COLORURINE AMBER (A) 10/22/2019 1000   APPEARANCEUR CLOUDY (A) 10/22/2019 1000   LABSPEC 1.024 10/22/2019 1000   PHURINE 5.0 10/22/2019 1000   GLUCOSEU NEGATIVE 10/22/2019 1000   HGBUR LARGE (A) 10/22/2019 1000   BILIRUBINUR NEGATIVE 10/22/2019 1000   KETONESUR NEGATIVE 10/22/2019 1000   PROTEINUR 100 (A) 03/02/2020 0950   NITRITE NEGATIVE 10/22/2019 1000   LEUKOCYTESUR SMALL (A) 10/22/2019 1000    Radiological Exams on Admission: No results found.  Assessment/Plan Symptomatic anemia Gastric cancer w/ chronic GIB     - admit to inpt, progressive     - transfuse 1 unit pRBCs     - she is DNR     - pt and family have agreed to comfort care measures after the unit of blood     - no further lab draws after blood transfusion     - anxiety/pain control; nausea control; pleasure feeds     - consult palliative care, TOC  SLE HTN Peripheral neuropathy Hx of Massive PE w/ cor pulmonale Moderate protein calorie malnutrition Chronic nausea Thrombocytopenia Hx of breast cancer     - patient will be made comfort care after transfusion of blood; will hold chronic meds except for pain/nausea control  DVT prophylaxis: None, patient to be comfort care Code Status: DNR  Family Communication: w/ mother at bedside  Consults called: Onco (Dr. Benay Spice), palliative care   Status is: Inpatient  Remains inpatient appropriate because:Inpatient level of care appropriate due to severity of illness  Dispo: The patient is from: Home              Anticipated d/c is to:  TBD              Patient currently is not medically stable to d/c.   Difficult to place patient No  Time spent coordinating admission: 70 minutes  [facility]  If 7PM-7AM, please contact night-coverage [email or web address]  12/16/2020, 2:29 PM

## 2020-12-17 ENCOUNTER — Inpatient Hospital Stay: Payer: Medicare Other

## 2020-12-17 DIAGNOSIS — Z7189 Other specified counseling: Secondary | ICD-10-CM

## 2020-12-17 DIAGNOSIS — Z853 Personal history of malignant neoplasm of breast: Secondary | ICD-10-CM

## 2020-12-17 DIAGNOSIS — D696 Thrombocytopenia, unspecified: Secondary | ICD-10-CM

## 2020-12-17 DIAGNOSIS — D649 Anemia, unspecified: Secondary | ICD-10-CM

## 2020-12-17 DIAGNOSIS — C169 Malignant neoplasm of stomach, unspecified: Secondary | ICD-10-CM

## 2020-12-17 LAB — BPAM RBC
Blood Product Expiration Date: 202209252359
ISSUE DATE / TIME: 202209011759
Unit Type and Rh: 6200

## 2020-12-17 LAB — TYPE AND SCREEN
ABO/RH(D): A POS
Antibody Screen: NEGATIVE
Unit division: 0

## 2020-12-17 LAB — SARS CORONAVIRUS 2 (TAT 6-24 HRS): SARS Coronavirus 2: NEGATIVE

## 2020-12-17 MED ORDER — OXYCODONE HCL 5 MG PO TABS
5.0000 mg | ORAL_TABLET | ORAL | Status: DC | PRN
  Administered 2020-12-17 – 2020-12-18 (×5): 10 mg via ORAL
  Filled 2020-12-17 (×6): qty 2

## 2020-12-17 NOTE — Progress Notes (Addendum)
HEMATOLOGY-ONCOLOGY PROGRESS NOTE  SUBJECTIVE: Michelle Cain is followed by our office for metastatic gastric cancer.  She was seen yesterday and was admitted for comfort measures, palliative consultation and to make arrangements for hospice.  She received 1 unit of blood.  She has been placed on comfort measures.  She reports that abdominal pain is not adequately controlled this morning.  She has been receiving oxycodone 5 mg every 6 hours as needed.  Denies nausea and vomiting this morning.  Oncology History  Malignant neoplasm of stomach ([place])  08/26/2019 Initial Diagnosis   Malignant neoplasm of stomach ([place])   09/04/2019 - 08/11/2020 Chemotherapy          09/19/2019 Genetic Testing   Negative genetic testing on the common hereditary cancer panel.  The Common Hereditary Gene Panel offered by [facility] includes sequencing and/or deletion duplication testing of the following 48 genes: APC, ATM, AXIN2, BARD1, BMPR1A, BRCA1, BRCA2, BRIP1, CDH1, CDK4, CDKN2A (p14ARF), CDKN2A (p16INK4a), CHEK2, CTNNA1, DICER1, EPCAM (Deletion/duplication testing only), GREM1 (promoter region deletion/duplication testing only), KIT, MEN1, MLH1, MSH2, MSH3, MSH6, MUTYH, NBN, NF1, NHTL1, PALB2, PDGFRA, PMS2, POLD1, POLE, PTEN, RAD50, RAD51C, RAD51D, RNF43, SDHB, SDHC, SDHD, SMAD4, SMARCA4. STK11, TP53, TSC1, TSC2, and VHL.  The following genes were evaluated for sequence changes only: SDHA and HOXB13 c.251G>A variant only. The report date is September 19, 2019.   10/27/2020 -  Chemotherapy    Patient is on Treatment Plan: COLORECTAL IRINOTECAN Q14D        PHYSICAL EXAMINATION:  Vitals:   12/17/20 0041 12/17/20 0452  BP: 95/69 105/80  Pulse: (!) 108 (!) 110  Resp: 20 20  Temp: 99.1 F (37.3 C) 98.6 F (37 C)  SpO2: 100% 100%   There were no vitals filed for this visit.  Intake/Output from previous day: 09/01 0701 - 09/02 0700 In: 712 [P.O.:400; Blood:312] Out: 0   GENERAL:alert, no distress and  comfortable OROPHARYNX: No thrush LUNGS: clear to auscultation and percussion with normal breathing effort HEART: Tachycardia, regular rhythm  ABDOMEN: Diffuse tenderness, no mass  NEURO: alert & oriented x 3 with fluent speech, no focal motor/sensory deficits  LABORATORY DATA:  I have reviewed the data as listed CMP Latest Ref Rng & Units 10/22/2020 10/15/2020 09/30/2020  Glucose 70 - 99 mg/dL 103(H) 129(H) 147(H)  BUN 6 - 20 mg/dL _0 Creatinine 0.44 - 1.00 mg/dL 0.43(L) 0.47 0.39(L)  Sodium 135 - 145 mmol/L 136 141 139  Potassium 3.5 - 5.1 mmol/L 3.9 3.1(L) 3.7  Chloride 98 - 111 mmol/L 102 107 106  CO2 22 - 32 mmol/L _1 Calcium 8.9 - 10.3 mg/dL 8.6(L) 8.4(L) 8.5(L)  Total Protein 6.5 - 8.1 g/dL 5.9(L) 5.7(L) 5.7(L)  Total Bilirubin 0.3 - 1.2 mg/dL 0.5 0.2(L) 0.4  Alkaline Phos 38 - 126 U/L 119 81 73  AST 15 - 41 U/L 39 19 29  ALT 0 - 44 U/L _2 Lab Results  Component Value Date   WBC 13.0 (H) 12/16/2020   HGB 7.8 (L) 12/16/2020   HCT 24.6 (L) 12/16/2020   MCV 98.8 12/16/2020   PLT 88 (L) 12/16/2020   NEUTROABS 10.3 (H) 12/16/2020    No results found.  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, no loss of mismatch  repair protein expression, HER-2 negative by FISH, GATA3 negative, ER negative CTs revealed wall thickening and luminal narrowing of the colon at the hepatic flexure and cecum PET scan with no evidence of distant metastatic disease or abnormal uptake other than thickening of the distal esophagus and proximal stomach Colonoscopy 10/05/2017 at [facility] [place] revealed multiple foci of thickening/masses at the cecum, hepatic flexure, and distal rectum, biopsy  from the cecum and hepatic flexure revealed metastatic adenocarcinoma of gastric origin, 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, PD-L1 combined positive score 2 on hepatic flexure biopsy CTs 10/23/2017-diffuse prominence of the gastric wall, especially the antrum, focal wall thickening of the distal ascending colon and hepatic flexure, thickening of the cecum, nonspecific haziness of the mesenteric fat in the pelvis, mild prominence of lymph nodes at the greater curvature of the stomach Treatment with FOLFOX beginning July 2019 CTs October 2019-stable disease, FOLFOX continued CTs December 2019-stable disease January 2020 treatment transition to Xeloda maintenance, care transition to [place] CTs in June 2020 in September 2020-stable disease, Xeloda continued CTs 06/23/2019-nonspecific thickening of the GE junction, intraluminal bladder mass Cystoscopy-metastatic signet ring cell adenocarcinoma Cycle 1 day 1 ramucirumab/paclitaxel 08/19/2019 (given at another facility) Cycle 1 day 1 ramucirumab/paclitaxel 09/04/2019 Cycle 1 day 8 Taxol 09/10/19 Cycle 1 day 15 ramucirumab/paclitaxel 09/17/2019 canceled d/t neutropenia Ramucirumab/pactlitaxel every 2 weeks 09/24/2019 Ramucirumab/paclitaxel 10/08/2019 Ramucirumab/paclitaxel 10/22/2019 Ramucirumab/paclitaxel 11/05/2019 CTs 11/24/2019-unchanged thickening of the distal esophagus/upper stomach, gastric body and antrum.  Unchanged thickening of the transverse colon at the hepatic flexure, unchanged thickening of the urinary bladder, no evidence of progressive disease Ramucirumab/paclitaxel 11/25/2019 Ramucirumab/paclitaxel 12/09/2019  Ramucirumab/paclitaxel 12/23/2019 Ramucirumab/paclitaxel 01/06/2020 Ramucirumab/paclitaxel 01/20/2020 Ramucirumab/paclitaxel 02/04/2020 Ramucirumab/paclitaxel 02/17/2020 Ramucirumab/paclitaxel 03/02/2020 Ramucirumab/paclitaxel 03/16/2020 CTs 03/28/2020-stable thickening of the  distal esophagus, GE junction, gastric body, and antrum.  Decreased soft tissue thickening of the transverse colon at the hepatic flexure, no evidence of metastatic disease to the chest Ramucirumab/paclitaxel 03/30/2020 SBRT to the stomach 04/12/2020-05/06/2020, 37.5 Gray in 15 fractions Ramucirumab/paclitaxel 04/05/2020 Ramucirumab/paclitaxel 04/13/2020 Ramucirumab/paclitaxel 04/27/2020 Ramucirumab/paclitaxel 05/25/2020 Ramucirumab/paclitaxel 06/11/2020 Ramucirumab/paclitaxel 06/22/2020 Ramucirumab/paclitaxel 07/06/2020 Ramucirumab/paclitaxel 07/20/2020 CT abdomen/pelvis 08/03/2020- wall thickening at the distal esophagus and stomach body/antrum-progressive, indistinct stranding around the descending duodenum, porta hepatis, and retroperitoneum- stable, abnormal wall thickening at the left upper urinary bladder-stable Ramucirumab/paclitaxel 08/11/2020 Upper endoscopy 08/26/2020- gastric mucosa nearly completely replaced with cancer.  Most proximal aspect of the stomach was spared but starting at the proximal gastric body there was extensive, circumferential cancer that continued into the proximal duodenal bulb.  The tumor is ulcerated in some areas, edematous throughout, very friable with spontaneous oozing.  The lumen of the distal stomach was narrowed due to edematous malignant changes but this did not limit scope passage. Guardant 360 09/15/2020-CDH1,PIK3CA Palliative radiation to the stomach at [facility], 4 fractions completed 09/28/2020 Supportive care with transfusion support   2.  Left breast cancer 2008,pT1c,pN0, status post a left lumpectomy with adjuvant chemotherapy and radiation, ER positive, PR positive, HER-2 positive   3.  Mixed connective tissue disease/SLE   4.  Lower extremity deep vein thrombosis maintained on apixaban-placed on hold due to hematemesis, hematuria 08/19/2020.  See findings of upper endoscopy from 08/26/2020.  Dr. Ardis Hughs recommends against ever resuming a blood thinner.   5.   Family history of multiple cancers including breast and ovarian cancer   6.  Dysphagia and intermittent vomiting secondary to #1   7.  Hypertension   8.  Peripheral neuropathy   9.  Masslike fullness at the posterior right parotid/angle of the jaw   10. Dyspnea on exertion, ongoing for about a month, worsened after taxol/ramucirumab on 09/04/19 requiring ED visit on 5/23. CBC, CMP, troponin, BNP and chest xray negative    11.  Anemia likely secondary to combination of chemotherapy and blood loss 2 units packed red blood cells 04/12/2020, 07/20/2020 12.  Admission 05/11/2020 with pneumonia, CT chest revealed left upper and lower lobe opacities 13.  Progressive abdominal pain beginning 07/29/2020- etiology unclear, likely related to progression of tumor in the stomach 14.  Massive PE with cor pulmonale May 2022 15.  Hospital admission 09/08/2020 massive syncope, PE, symptomatic anemia. Bilateral pulmonary angiogram and mechanical thrombectomy 09/09/2020 IVC filter placement 09/12/2020 Lovenox adjusted to 50 mg every 12 hours while admitted at [facility] June 2022  16.  Hospitalized at [facility] 11/02/2020 through 11/14/2020 with hematemesis, melena, syncope.  Transfused multiple units of blood.  No longer felt to be a candidate for chemotherapy.  Discharge plan for continued transfusion as needed 17.  Thrombocytopenia-potentially related to polypharmacy metastatic disease involving the bone marrow, tumor-associated DIC  Michelle Cain has metastatic gastric cancer.  She is symptomatic with local progression of tumor causing nausea, vomiting, and pain.  She also has bleeding secondary to tumor involvement of the stomach.  The bleeding is complicated by the need for anticoagulation therapy after recent hospital admission with massive PE.  She has required frequent transfusion support and has been having a declining performance status.  Her poor prognosis has been discussed with the patient and family members.   They understand the poor prognosis and survival is estimated to most likely be days to a few weeks.  She has agreed to DNR/DNI and also agrees to hospice.  She will meet with the palliative care team this hospitalization.  We will also ask for TOC to begin to make arrangements for hospice upon discharge in her home.  Her pain is not adequately controlled at this time and will adjust oxycodone to 5 to 10 mg every 4 hours as needed for pain.  Recommendations: 1.  Palliative care consultation 2.  Oxycodone 5 to 10 mg every 4 hours as needed for pain, can change to sublingual oxycodone or morphine if she has difficulty taking pills 3.  TOC consultation to arrange for hospice upon discharge in her home 4.  Comfort measures 5.  Please call Oncology as needed   LOS: 1 day   Mikey Bussing, DNP, AGPCNP-BC, AOCNP 12/17/20 Michelle Cain was interviewed and examined.  She reports feeling better after receiving the unit of red cells yesterday.  We discussed the disposition plan.  She understands a poor prognosis.  She agrees to home hospice care.  We discussed the indication for continuing anticoagulation therapy.  She continues to have intermittent hematemesis and severe anemia requiring transfusion support.  She understands the risk of recurrent pulmonary embolism.  She agrees to discontinuing Lovenox.  The plan is for no further blood draws or transfusions.  I was present for greater than 50% of today's visit.  I performed medical decision making.  Julieanne Manson, MD

## 2020-12-17 NOTE — Progress Notes (Signed)
Nutrition Brief Note  Patient screened for MST with score of 2. Chart reviewed. Med Onc NP note from this AM states that patient was admitted for comfort measures and to make arrangements for hospice. Patient now transitioning to comfort care. She is on a Regular diet.  No further nutrition interventions planned at this time. Please consult as needed.        Jarome Matin, MS, RD, LDN, CNSC Inpatient Clinical Dietitian RD pager # available in [place]  After hours/weekend pager # available in [facility]

## 2020-12-17 NOTE — TOC Progression Note (Signed)
Transition of Care [facility]) - Progression Note    Patient Details  Name: Michelle Cain MRN: [id number] Date of Birth: Jan 18, 1966  Transition of Care [facility]) CM/SW Contact  Purcell Mouton, RN Phone Number: 12/17/2020, 3:06 PM  Clinical Narrative:    Spoke with pt in length concerning Home with Hospice. Pt is from [place], [facility] was selected. Referral was called to [phone] and faxed to [phone].    Expected Discharge Plan: Home w Hospice Care Barriers to Discharge: No Barriers Identified  Expected Discharge Plan and Services Expected Discharge Plan: [place] arrangements for the past 2 months: Single Family Home                                       Social Determinants of Health (SDOH) Interventions    Readmission Risk Interventions No flowsheet data found.

## 2020-12-17 NOTE — Progress Notes (Signed)
PROGRESS NOTE    Michelle Cain  [id number] DOB: February 01, 1966 DOA: 12/16/2020 PCP: Laurette Schimke, DO    Brief Narrative:  55 year old female with a history of metastatic gastric cancer, systemic lupus erythematosus, chronic GI bleeding secondary to cancer, VTE on Lovenox, admitted to the hospital with fatigue/weakness she was found to have low hemoglobin.  She was seen by oncology and was transfused 1 unit of PRBC.  Further goals of care were discussed and patient has elected to discharge home with hospice services and transition to comfort care.   Assessment & Plan:   Active Problems:   Symptomatic anemia   Symptomatic anemia secondary to chronic blood loss from gastric cancer -Patient was transfused 1 unit of PRBC on admission -No further blood draws planned since she is now transition to comfort measures -Seen by oncology who further discussed goals of care -Patient is planning on discharging home with hospice services -Pain medicines are being adjusted and once pain is reasonably controlled, can plan on transition home -Palliative care was consulted, case was reviewed with Dr. Rowe Pavy and since goals of care and disposition have already been outlined, she did not feel they had any further input to offer  History of pulmonary embolism -Was previously on anticoagulation, this will be held now in light of ongoing issues with anemia and transition to comfort measures   DVT prophylaxis:   None  Code Status: DNR, comfort measures Family Communication: Discussed with patient Disposition Plan: Status is: Inpatient  Remains inpatient appropriate because:Ongoing active pain requiring inpatient pain management and Inpatient level of care appropriate due to severity of illness  Dispo: The patient is from: Home              Anticipated d/c is to: Home              Patient currently is not medically stable to d/c.   Difficult to place patient No         Consultants:   Oncology  Procedures:    Antimicrobials:      Subjective: She was having increasing pain in her abdomen this morning, medications recently adjusted by oncology.  Objective: Vitals:   12/17/20 0041 12/17/20 0452 12/17/20 0811 12/17/20 1407  BP: 95/69 105/80  (!) 109/57  Pulse: (!) 108 (!) 110  99  Resp: '20 20  14  '$ Temp: 99.1 F (37.3 C) 98.6 F (37 C)  99.1 F (37.3 C)  TempSrc: Oral Oral  Oral  SpO2: 100% 100%  100%  Weight:   76.8 kg     Intake/Output Summary (Last 24 hours) at 12/17/2020 1918 Last data filed at 12/17/2020 1400 Gross per 24 hour  Intake 792 ml  Output 0 ml  Net 792 ml   Filed Weights   12/17/20 0811  Weight: 76.8 kg    Examination:  General exam: Appears calm and comfortable  Respiratory system: Clear to auscultation. Respiratory effort normal. Cardiovascular system: S1 & S2 heard, RRR. No JVD, murmurs, rubs, gallops or clicks. No pedal edema. Gastrointestinal system: Abdomen is nondistended, soft and nontender. No organomegaly or masses felt. Normal bowel sounds heard. Central nervous system: Alert and oriented. No focal neurological deficits. Extremities: Symmetric 5 x 5 power. Skin: No rashes, lesions or ulcers Psychiatry: Judgement and insight appear normal. Mood & affect appropriate.     Data Reviewed: I have personally reviewed following labs and imaging studies  CBC: Recent Labs  Lab 12/16/20 0810  WBC 13.0*  NEUTROABS 10.3*  HGB 7.8*  HCT 24.6*  MCV 98.8  PLT 88*   Basic Metabolic Panel: No results for input(s): NA, K, CL, CO2, GLUCOSE, BUN, CREATININE, CALCIUM, MG, PHOS in the last 168 hours. GFR: CrCl cannot be calculated (Patient's most recent lab result is older than the maximum 21 days allowed.). Liver Function Tests: No results for input(s): AST, ALT, ALKPHOS, BILITOT, PROT, ALBUMIN in the last 168 hours. No results for input(s): LIPASE, AMYLASE in the last 168 hours. No results for input(s): AMMONIA in the last 168  hours. Coagulation Profile: No results for input(s): INR, PROTIME in the last 168 hours. Cardiac Enzymes: No results for input(s): CKTOTAL, CKMB, CKMBINDEX, TROPONINI in the last 168 hours. BNP (last 3 results) No results for input(s): PROBNP in the last 8760 hours. HbA1C: No results for input(s): HGBA1C in the last 72 hours. CBG: No results for input(s): GLUCAP in the last 168 hours. Lipid Profile: No results for input(s): CHOL, HDL, LDLCALC, TRIG, CHOLHDL, LDLDIRECT in the last 72 hours. Thyroid Function Tests: No results for input(s): TSH, T4TOTAL, FREET4, T3FREE, THYROIDAB in the last 72 hours. Anemia Panel: No results for input(s): VITAMINB12, FOLATE, FERRITIN, TIBC, IRON, RETICCTPCT in the last 72 hours. Sepsis Labs: No results for input(s): PROCALCITON, LATICACIDVEN in the last 168 hours.  Recent Results (from the past 240 hour(s))  SARS CORONAVIRUS 2 (TAT 6-24 HRS) Nasopharyngeal Nasopharyngeal Swab     Status: None   Collection Time: 12/16/20  3:09 PM   Specimen: Nasopharyngeal Swab  Result Value Ref Range Status   SARS Coronavirus 2 NEGATIVE NEGATIVE Final    Comment: (NOTE) SARS-CoV-2 target nucleic acids are NOT DETECTED.  The SARS-CoV-2 RNA is generally detectable in upper and lower respiratory specimens during the acute phase of infection. Negative results do not preclude SARS-CoV-2 infection, do not rule out co-infections with other pathogens, and should not be used as the sole basis for treatment or other patient management decisions. Negative results must be combined with clinical observations, patient history, and epidemiological information. The expected result is Negative.  Fact Sheet for Patients: SugarRoll.be  Fact Sheet for Healthcare Providers: https://www.woods-mathews.com/  This test is not yet approved or cleared by the [place] FDA and  has been authorized for detection and/or diagnosis of SARS-CoV-2  by FDA under an Emergency Use Authorization (EUA). This EUA will remain  in effect (meaning this test can be used) for the duration of the COVID-19 declaration under Se ction 564(b)(1) of the Act, 21 U.S.C. section 360bbb-3(b)(1), unless the authorization is terminated or revoked sooner.  Performed at [facility], [place] [address], [place], [place] [address]          Radiology Studies: No results found.      Scheduled Meds:  Chlorhexidine Gluconate Cloth  6 each Topical Daily   dronabinol  2.5 mg Oral BID AC   pantoprazole  40 mg Oral BID   Continuous Infusions:   LOS: 1 day    Time spent: 64mns    JKathie Dike MD [facility]   If 7PM-7AM, please contact night-coverage [email or web address]  12/17/2020, 7:18 PM

## 2020-12-18 MED ORDER — HEPARIN SOD (PORK) LOCK FLUSH 100 UNIT/ML IV SOLN
500.0000 [IU] | INTRAVENOUS | Status: DC
  Administered 2020-12-18: 500 [IU]

## 2020-12-18 MED ORDER — SODIUM CHLORIDE 0.9 % IV SOLN
6.2500 mg | Freq: Once | INTRAVENOUS | Status: AC
  Administered 2020-12-18: 6.25 mg via INTRAVENOUS
  Filled 2020-12-18 (×5): qty 0.25

## 2020-12-18 MED ORDER — OXYCODONE HCL 5 MG PO TABS: 5.0000 mg | ORAL_TABLET | ORAL | 0 refills | Status: DC | PRN

## 2020-12-18 MED ORDER — PROMETHAZINE HCL 25 MG RE SUPP: 25.0000 mg | Freq: Four times a day (QID) | RECTAL | 0 refills | Status: AC | PRN

## 2020-12-18 MED ORDER — HEPARIN SOD (PORK) LOCK FLUSH 100 UNIT/ML IV SOLN
500.0000 [IU] | INTRAVENOUS | Status: DC | PRN
  Filled 2020-12-18: qty 5

## 2020-12-18 MED ORDER — DRONABINOL 2.5 MG PO CAPS: 2.5000 mg | ORAL_CAPSULE | Freq: Two times a day (BID) | ORAL | 0 refills | Status: AC

## 2020-12-18 NOTE — TOC Transition Note (Signed)
Transition of Care [facility]) - CM/SW Discharge Note   Patient Details  Name: Michelle Cain MRN: [id number] Date of Birth: Aug 27, 1965  Transition of [facility] CM/SW Contact:  Grover Robinson, Marta Lamas, LCSW Phone Number: 12/18/2020, 11:50 AM   Clinical Narrative:     Patient will return home to live with mother, Michelle Cain at time of discharge.  Home Care and Hospice Services have been arranged for patient through [facility].  LCSW confirmed authorization of Hospice Services through Rulon Abide, Admissions Director of [facility].  Please contact Mrs. Julien Girt [phone]) as soon as non-emergency ambulance transport through [facility] arrives on the unit to transport patient home.  [facility]AR has been arranged, but unable to provide time of transport.   A shower chair has been ordered for patient through [facility] [place] agency contracted with [facility].  Physician, patient and mother notified of arrangements and all are in agreement.  Final next level of care: Home w Hospice Care Barriers to Discharge: No Barriers Identified   Patient Goals and CMS Choice Patient states their goals for this hospitalization and ongoing recovery are:: To feel better CMS Medicare.gov Compare Post Acute Care list provided to:: Patient Choice offered to / list presented to : Patient  Discharge Placement          N/A      Discharge Plan and [place] with Hospice Services through [facility] in [place], [place].         Social Determinants of Health (SDOH) Interventions     Readmission Risk Interventions No flowsheet data found.  Nat Christen, BSW, MSW, [employer]  Licensed Holiday representative  [employer]  Mailing Address-[address] [place], [place] [address] Physical Address-[address], [place],  [address] Toll Free Main # [phone] Fax #  [phone] Cell # [phone]  [email or web address]

## 2020-12-18 NOTE — Consult Note (Signed)
EMS is here to pick up patient. I called Lenard Forth (mother) and talked with her about the patient's discharge. Iv team on the way to deaccess port

## 2020-12-18 NOTE — Discharge Summary (Signed)
Physician Discharge Summary  Michelle Cain [id number] DOB: [birth year] DOA: 12/16/2020  PCP: Laurette Schimke, DO  Admit date: 12/16/2020 Discharge date: 12/18/2020  Admitted From: Home Disposition: Home  Recommendations for Outpatient Follow-up:  Patient is being discharged home with hospice services  Discharge Condition: Stable CODE STATUS: DNR, comfort measures Diet recommendation: Regular diet for comfort  Brief/Interim Summary: 55 year old female with a history of metastatic gastric cancer, systemic lupus erythematosus, chronic GI bleeding secondary to cancer, VTE on Lovenox, admitted to the hospital with fatigue/weakness she was found to have low hemoglobin.  She was seen by oncology and was transfused 1 unit of PRBC.  Further goals of care were discussed and patient has elected to discharge home with hospice services and transition to comfort care.  Discharge Diagnoses:  Active Problems:   Symptomatic anemia  Symptomatic anemia secondary to chronic blood loss from gastric cancer -Patient was transfused 1 unit of PRBC on admission -No further blood draws planned since she is now transition to comfort measures -Seen by oncology who further discussed goals of care -Patient is planning on discharging home with hospice services -Oxycodone was adjusted for pain management and her pain appears to be reasonably controlled -Palliative care was consulted, case was reviewed with Dr. Rowe Pavy and since goals of care and disposition have already been outlined, she did not feel they had any further input to offer   History of pulmonary embolism -Was previously on anticoagulation, this will be held now in light of ongoing issues with anemia and transition to comfort measures  Discharge Instructions  Discharge Instructions     Diet - low sodium heart healthy   Complete by: As directed    Increase activity slowly   Complete by: As directed       Allergies as of 12/18/2020   No  Known Allergies      Medication List     STOP taking these medications    cyclobenzaprine 10 MG tablet Commonly known as: FLEXERIL   enoxaparin 40 MG/0.4ML injection Commonly known as: LOVENOX   enoxaparin 60 MG/0.6ML injection Commonly known as: LOVENOX   hydroxychloroquine 200 MG tablet Commonly known as: PLAQUENIL   pantoprazole 40 MG tablet Commonly known as: Protonix       TAKE these medications    albuterol 108 (90 Base) MCG/ACT inhaler Commonly known as: VENTOLIN HFA Inhale 2 puffs into the lungs every 6 (six) hours as needed for wheezing or shortness of breath.   dronabinol 2.5 MG capsule Commonly known as: MARINOL Take 1 capsule (2.5 mg total) by mouth 2 (two) times daily.   omeprazole 40 MG capsule Commonly known as: PRILOSEC [The details of the medication are not available because there are pending changes by a home health clinician.]   ondansetron 4 MG tablet Commonly known as: ZOFRAN Take 4 mg by mouth every 8 (eight) hours as needed for nausea or vomiting.   oxyCODONE 5 MG immediate release tablet Commonly known as: Oxy IR/ROXICODONE Take 1-2 tablets (5-10 mg total) by mouth every 4 (four) hours as needed for moderate pain. What changed:  how much to take when to take this Another medication with the same name was removed. Continue taking this medication, and follow the directions you see here.   promethazine 25 MG suppository Commonly known as: PHENERGAN Place 1 suppository (25 mg total) rectally every 6 (six) hours as needed for nausea or vomiting.               Durable Medical  Equipment  (From admission, onward)           Start     Ordered   12/18/20 1046  For home use only DME Shower stool  Once       Comments: Shower chair with back   12/18/20 1046            No Known Allergies  Consultations: Oncology   Procedures/Studies: No results found.    Subjective: Pain is controlled  Discharge Exam: Vitals:    12/17/20 2100 12/17/20 2333 12/18/20 0625 12/18/20 1500  BP: 108/72  105/69 113/72  Pulse: (!) 114  (!) 109 (!) 107  Resp: 19   14  Temp: 98.8 F (37.1 C)  98.1 F (36.7 C) 98.2 F (36.8 C)  TempSrc: Oral  Oral Oral  SpO2: 100%  100% 100%  Weight:  76.8 kg    Height:  '5\' 4"'$  (1.626 m)      General: Pt is alert, awake, not in acute distress Cardiovascular: RRR, S1/S2 +, no rubs, no gallops Respiratory: CTA bilaterally, no wheezing, no rhonchi Abdominal: Soft, NT, ND, bowel sounds + Extremities: no edema, no cyanosis    The results of significant diagnostics from this hospitalization (including imaging, microbiology, ancillary and laboratory) are listed below for reference.     Microbiology: Recent Results (from the past 240 hour(s))  SARS CORONAVIRUS 2 (TAT 6-24 HRS) Nasopharyngeal Nasopharyngeal Swab     Status: None   Collection Time: 12/16/20  3:09 PM   Specimen: Nasopharyngeal Swab  Result Value Ref Range Status   SARS Coronavirus 2 NEGATIVE NEGATIVE Final    Comment: (NOTE) SARS-CoV-2 target nucleic acids are NOT DETECTED.  The SARS-CoV-2 RNA is generally detectable in upper and lower respiratory specimens during the acute phase of infection. Negative results do not preclude SARS-CoV-2 infection, do not rule out co-infections with other pathogens, and should not be used as the sole basis for treatment or other patient management decisions. Negative results must be combined with clinical observations, patient history, and epidemiological information. The expected result is Negative.  Fact Sheet for Patients: SugarRoll.be  Fact Sheet for Healthcare Providers: https://www.woods-mathews.com/  This test is not yet approved or cleared by the [place] FDA and  has been authorized for detection and/or diagnosis of SARS-CoV-2 by FDA under an Emergency Use Authorization (EUA). This EUA will remain  in effect (meaning this  test can be used) for the duration of the COVID-19 declaration under Se ction 564(b)(1) of the Act, 21 U.S.C. section 360bbb-3(b)(1), unless the authorization is terminated or revoked sooner.  Performed at [facility], [place] [address] [place], [place] [address]      Labs: BNP (last 3 results) Recent Labs    05/11/20 1644  BNP [id number]   Basic Metabolic Panel: No results for input(s): NA, K, CL, CO2, GLUCOSE, BUN, CREATININE, CALCIUM, MG, PHOS in the last 168 hours. Liver Function Tests: No results for input(s): AST, ALT, ALKPHOS, BILITOT, PROT, ALBUMIN in the last 168 hours. No results for input(s): LIPASE, AMYLASE in the last 168 hours. No results for input(s): AMMONIA in the last 168 hours. CBC: Recent Labs  Lab 12/16/20 0810  WBC 13.0*  NEUTROABS 10.3*  HGB 7.8*  HCT 24.6*  MCV 98.8  PLT 88*   Cardiac Enzymes: No results for input(s): CKTOTAL, CKMB, CKMBINDEX, TROPONINI in the last 168 hours. BNP: Invalid input(s): POCBNP CBG: No results for input(s): GLUCAP in the last 168 hours. D-Dimer No results for input(s):  DDIMER in the last 72 hours. Hgb A1c No results for input(s): HGBA1C in the last 72 hours. Lipid Profile No results for input(s): CHOL, HDL, LDLCALC, TRIG, CHOLHDL, LDLDIRECT in the last 72 hours. Thyroid function studies No results for input(s): TSH, T4TOTAL, T3FREE, THYROIDAB in the last 72 hours.  Invalid input(s): FREET3 Anemia work up No results for input(s): VITAMINB12, FOLATE, FERRITIN, TIBC, IRON, RETICCTPCT in the last 72 hours. Urinalysis    Component Value Date/Time   COLORURINE AMBER (A) 10/22/2019 1000   APPEARANCEUR CLOUDY (A) 10/22/2019 1000   LABSPEC 1.024 10/22/2019 1000   PHURINE 5.0 10/22/2019 1000   GLUCOSEU NEGATIVE 10/22/2019 1000   HGBUR LARGE (A) 10/22/2019 1000   BILIRUBINUR NEGATIVE 10/22/2019 1000   KETONESUR NEGATIVE 10/22/2019 1000   PROTEINUR 100 (A) 03/02/2020 0950   NITRITE NEGATIVE 10/22/2019 1000    LEUKOCYTESUR SMALL (A) 10/22/2019 1000   Sepsis Labs Invalid input(s): PROCALCITONIN,  WBC,  LACTICIDVEN Microbiology Recent Results (from the past 240 hour(s))  SARS CORONAVIRUS 2 (TAT 6-24 HRS) Nasopharyngeal Nasopharyngeal Swab     Status: None   Collection Time: 12/16/20  3:09 PM   Specimen: Nasopharyngeal Swab  Result Value Ref Range Status   SARS Coronavirus 2 NEGATIVE NEGATIVE Final    Comment: (NOTE) SARS-CoV-2 target nucleic acids are NOT DETECTED.  The SARS-CoV-2 RNA is generally detectable in upper and lower respiratory specimens during the acute phase of infection. Negative results do not preclude SARS-CoV-2 infection, do not rule out co-infections with other pathogens, and should not be used as the sole basis for treatment or other patient management decisions. Negative results must be combined with clinical observations, patient history, and epidemiological information. The expected result is Negative.  Fact Sheet for Patients: SugarRoll.be  Fact Sheet for Healthcare Providers: https://www.woods-mathews.com/  This test is not yet approved or cleared by the [place] FDA and  has been authorized for detection and/or diagnosis of SARS-CoV-2 by FDA under an Emergency Use Authorization (EUA). This EUA will remain  in effect (meaning this test can be used) for the duration of the COVID-19 declaration under Se ction 564(b)(1) of the Act, 21 U.S.C. section 360bbb-3(b)(1), unless the authorization is terminated or revoked sooner.  Performed at [facility], [place] [address], [place], [place] [address]      Time coordinating discharge: 73mns  SIGNED:   JKathie Dike MD  [facility] 12/18/2020, 9:17 PM   If 7PM-7AM, please contact night-coverage [email or web address]

## 2020-12-19 ENCOUNTER — Other Ambulatory Visit: Payer: Self-pay | Admitting: Internal Medicine

## 2020-12-19 MED ORDER — OXYCODONE HCL 10 MG PO TABS: 10.0000 mg | ORAL_TABLET | Freq: Four times a day (QID) | ORAL | 0 refills | Status: AC | PRN

## 2020-12-27 ENCOUNTER — Telehealth: Payer: Self-pay | Admitting: *Deleted

## 2020-12-27 NOTE — Telephone Encounter (Signed)
Mrs. Lenard Forth called to report that [place] died last July 18, 2022.

## 2021-01-04 ENCOUNTER — Encounter: Payer: Self-pay | Admitting: Oncology

## 2021-02-04 ENCOUNTER — Encounter: Payer: Self-pay | Admitting: Oncology

## 2021-02-24 ENCOUNTER — Encounter: Payer: Self-pay | Admitting: Oncology

## 2021-02-25 ENCOUNTER — Encounter: Payer: Self-pay | Admitting: Oncology

## 2021-03-02 ENCOUNTER — Encounter: Payer: Self-pay | Admitting: Oncology

## 2021-03-29 ENCOUNTER — Encounter: Payer: Self-pay | Admitting: Oncology

## 2021-04-05 ENCOUNTER — Encounter: Payer: Self-pay | Admitting: Oncology

## 2022-07-05 ENCOUNTER — Encounter: Payer: Self-pay | Admitting: Oncology

## 2022-08-15 IMAGING — CT CT ABD-PELV W/ CM
2 of 5 series · 14 of 46 positions shown, 16 images · IV contrast (APPLIED)
Comparison: 03/26/2020
COMPARISON: 03/26/2020

Addendum:
CLINICAL DATA: Restaging gastric cancer. Lupus. Left upper quadrant
pain and intermittent hematuria. Current chemotherapy.

EXAM:
CT ABDOMEN AND PELVIS WITH CONTRAST
TECHNIQUE: Multidetector CT imaging of the abdomen and pelvis was performed
using the standard protocol following bolus administration of
intravenous contrast.
CONTRAST:  75mL OMNIPAQUE IOHEXOL 300 MG/ML  SOLN

[Series 2: abd pel w · axial · 0.80mm/px · z∈[+753,+1198]mm · 11 of 99 slices shown, 13 images]
[im 5/99  soft-tissue]
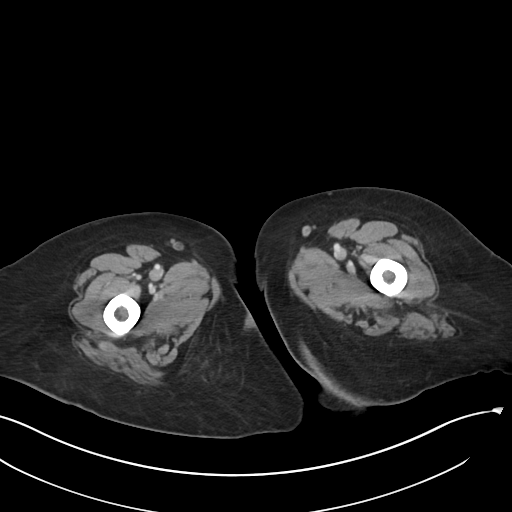
[im 5/99  bone]
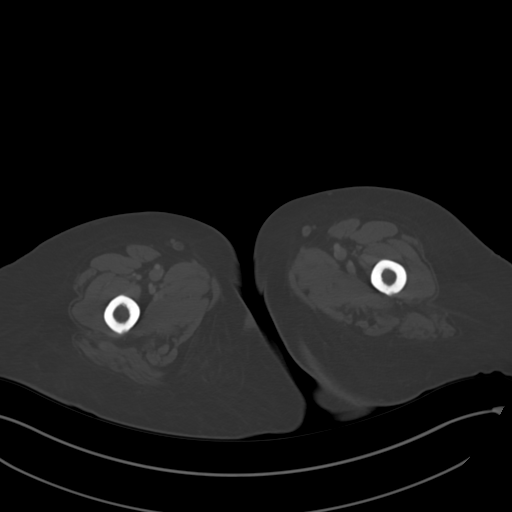
[im 15/99  soft-tissue]
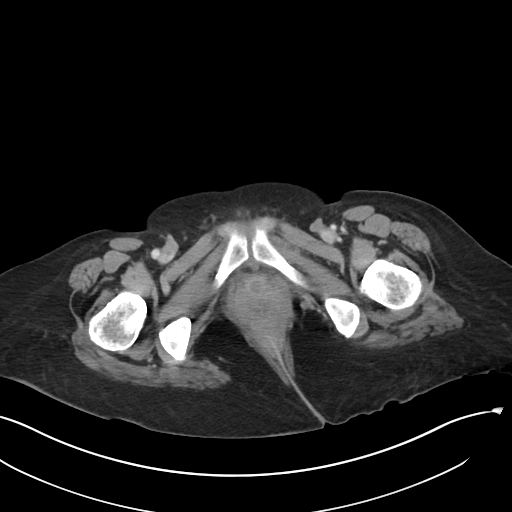
[im 25/99  soft-tissue]
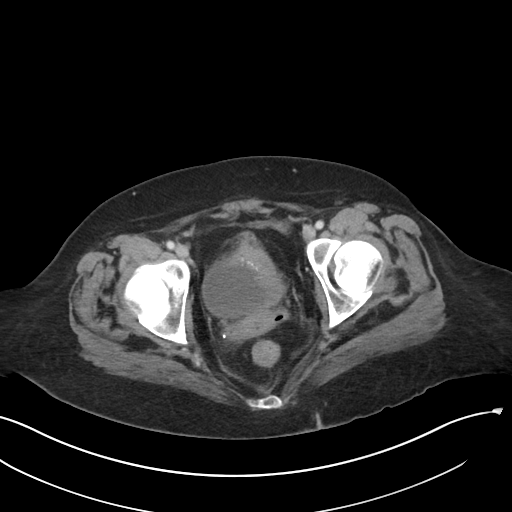
[im 35/99  soft-tissue]
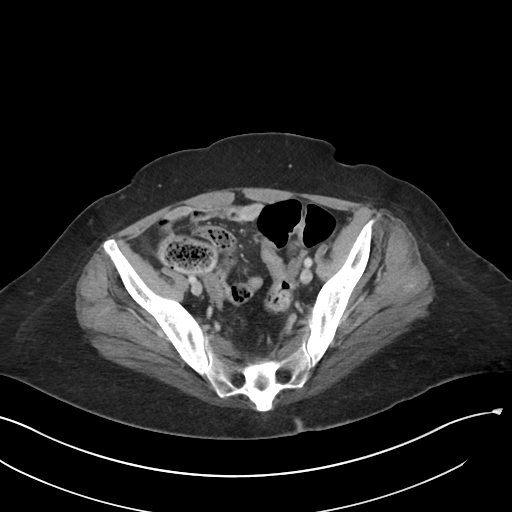
[im 40/99  soft-tissue]
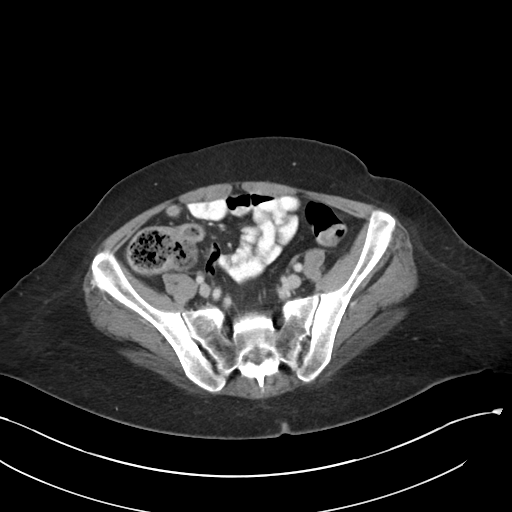
[im 50/99  soft-tissue]
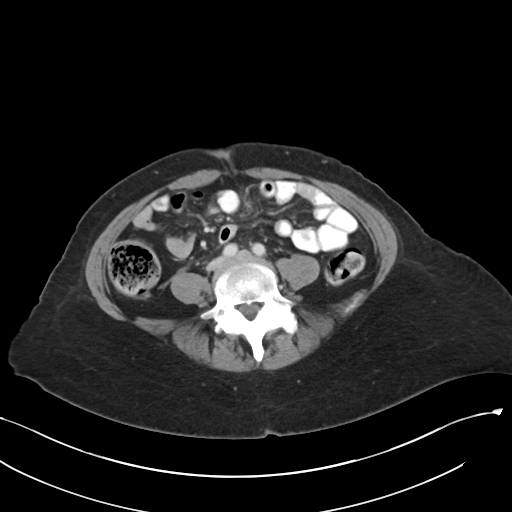
[im 59/99  soft-tissue]
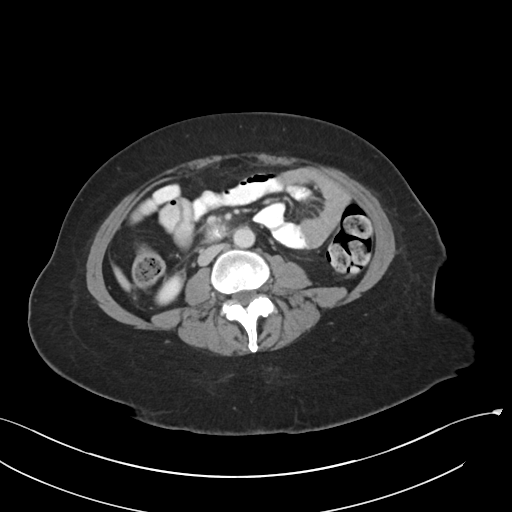
[im 64/99  soft-tissue]
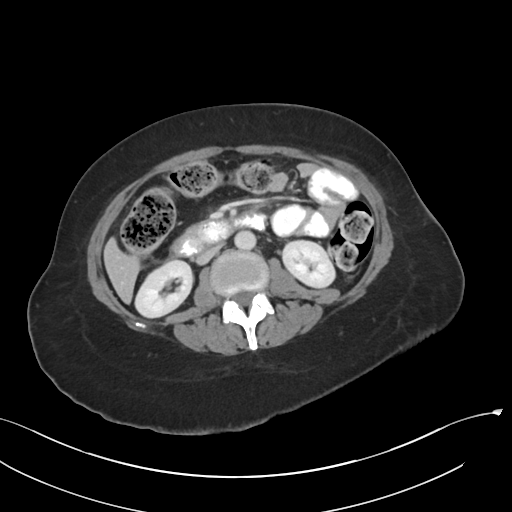
[im 74/99  soft-tissue]
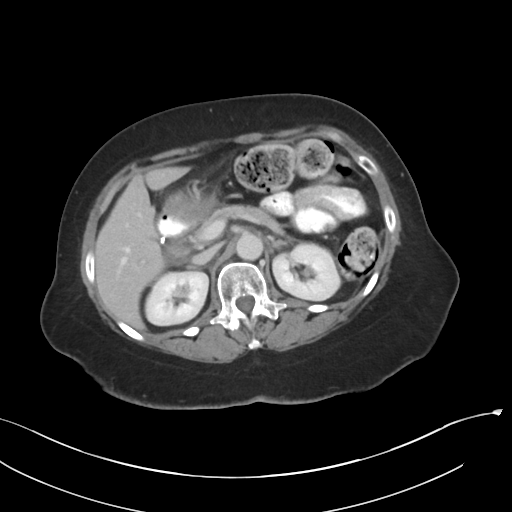
[im 74/99  bone]
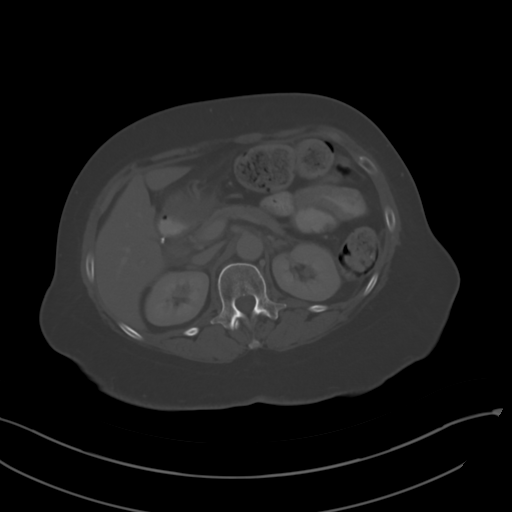
[im 84/99  soft-tissue]
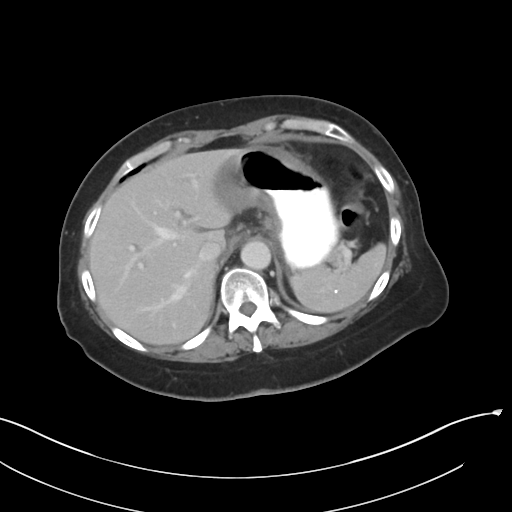
[im 94/99  soft-tissue]
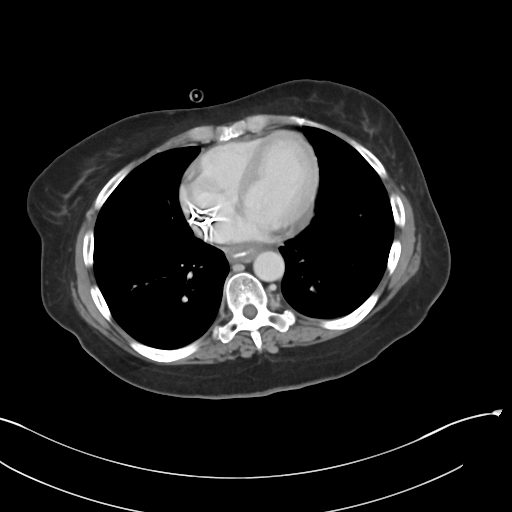

[Series 5: coronal · coronal · 0.94mm/px · 3 of 100 slices shown]
[im 34/100  soft-tissue]
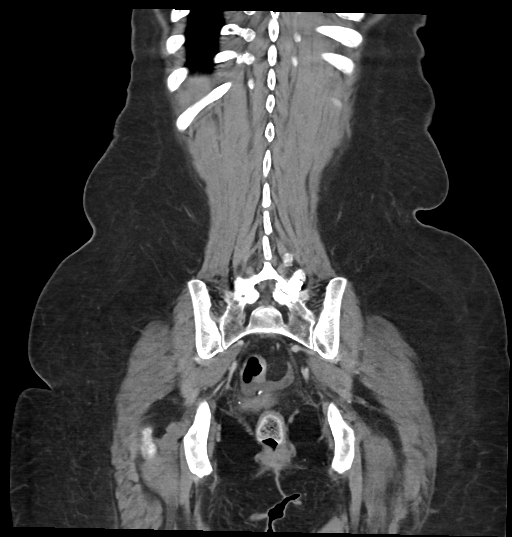
[im 45/100  soft-tissue]
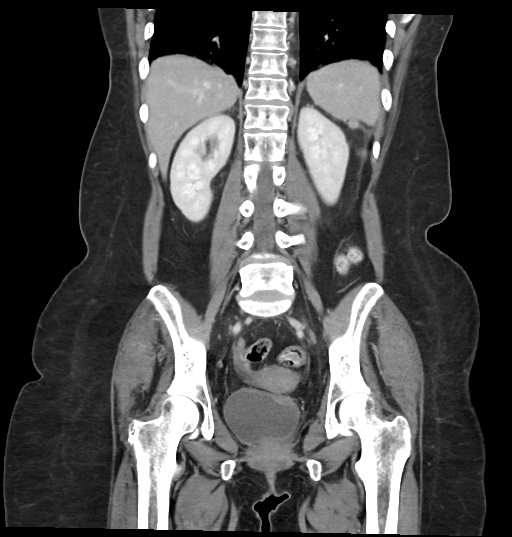
[im 56/100  soft-tissue]
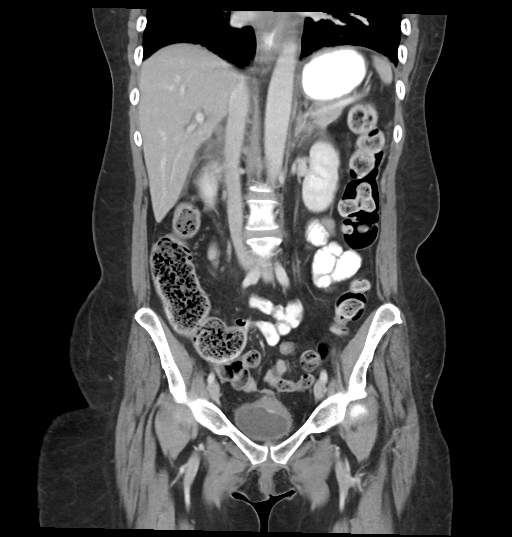

[14 of 46 positions shown; findings below may reference images not displayed]

FINDINGS: Lower chest: Abnormal circumferential wall thickening in the distal
esophagus.

Hepatobiliary: Cholecystectomy. Stable bandlike density along the
gallbladder fossa and inferior right hepatic lobe margin.

Pancreas: Unremarkable

Spleen: Punctate calcification in the spleen suggesting remote
granulomatous disease.

Adrenals/Urinary Tract: Both adrenal glands appear normal. Punctate
parenchymal calcification along the right kidney upper pole is
unchanged. 1.0 cm cyst of the right kidney upper pole. The ureters
appear unremarkable.

There is continued abnormal wall thickening along the left upper
urinary bladder measuring up to 1.3 cm in thickness on image 55
series 5, previously the same on 03/26/2020.

Stomach/Bowel: Substantial abnormal wall thickening in the gastric
body and antrum diffusely, worsened compared to 03/26/2020. There
continues to be indistinct stranding around the descending duodenum
and tracking along adjacent portions of the porta hepatis and
retroperitoneum, similar to the prior exam.

Vascular/Lymphatic: No pathologic adenopathy identified.

Reproductive: Unremarkable

Other: Small amount of free pelvic fluid. Trace presacral edema
similar to the prior exam.

Musculoskeletal: Degenerative facet arthropathy at L4-5 and L5-S1.
IMPRESSION: 1. There is substantial distal esophageal circumferential wall
thickening as well as wall thickening in the stomach body and
antrum. Components of this could be inflammatory but tumor is not
excluded.
2. There is some continued indistinct stranding around the
descending duodenum and adjacent portions of the retroperitoneum and
porta hepatis possibly inflammatory or from prior radiation therapy.
3. Small amount of pelvic ascites.
4. Abnormal wall thickening along the left upper urinary bladder
similar to the prior exam. Transitional cell carcinoma/bladder tumor
not excluded, but not significantly progressive.

ADDENDUM:
The original report was by Dr. Kay Tiul. The following
addendum is by Dr. Kay Tiul:

I discussed this case by telephone with Dr. Nodapera at [DATE] a.m.
on 08/11/2020. He noted that the bladder thickening is a known
metastatic lesion and that the patient may have colon involvement as
well. He noted that the patient was experiencing some subcostal
discomfort. We discussed this in the context of the potential
inflammatory findings along the stomach and descending duodenum (the
patient has had recent radiation therapy) as well as the prominence
of stool in the colon, which could cause discomfort secondary to
constipation.

*** End of Addendum ***
FINDINGS: Lower chest: Abnormal circumferential wall thickening in the distal
esophagus.

Hepatobiliary: Cholecystectomy. Stable bandlike density along the
gallbladder fossa and inferior right hepatic lobe margin.

Pancreas: Unremarkable

Spleen: Punctate calcification in the spleen suggesting remote
granulomatous disease.

Adrenals/Urinary Tract: Both adrenal glands appear normal. Punctate
parenchymal calcification along the right kidney upper pole is
unchanged. 1.0 cm cyst of the right kidney upper pole. The ureters
appear unremarkable.

There is continued abnormal wall thickening along the left upper
urinary bladder measuring up to 1.3 cm in thickness on image 55
series 5, previously the same on 03/26/2020.

Stomach/Bowel: Substantial abnormal wall thickening in the gastric
body and antrum diffusely, worsened compared to 03/26/2020. There
continues to be indistinct stranding around the descending duodenum
and tracking along adjacent portions of the porta hepatis and
retroperitoneum, similar to the prior exam.

Vascular/Lymphatic: No pathologic adenopathy identified.

Reproductive: Unremarkable

Other: Small amount of free pelvic fluid. Trace presacral edema
similar to the prior exam.

Musculoskeletal: Degenerative facet arthropathy at L4-5 and L5-S1.
IMPRESSION: 1. There is substantial distal esophageal circumferential wall
thickening as well as wall thickening in the stomach body and
antrum. Components of this could be inflammatory but tumor is not
excluded.
2. There is some continued indistinct stranding around the
descending duodenum and adjacent portions of the retroperitoneum and
porta hepatis possibly inflammatory or from prior radiation therapy.
3. Small amount of pelvic ascites.
4. Abnormal wall thickening along the left upper urinary bladder
similar to the prior exam. Transitional cell carcinoma/bladder tumor
not excluded, but not significantly progressive.

## 2022-08-17 ENCOUNTER — Encounter: Payer: Self-pay | Admitting: Oncology

## 2023-03-07 ENCOUNTER — Encounter: Payer: Self-pay | Admitting: Oncology

## 2023-03-07 NOTE — Telephone Encounter (Signed)
Telephone call
